# Patient Record
Sex: Female | Born: 1945
Health system: Southern US, Community
[De-identification: ages and names within clinical notes are randomized; demographics above are authoritative.]

## PROBLEM LIST (undated history)

## (undated) DIAGNOSIS — E039 Hypothyroidism, unspecified: Secondary | ICD-10-CM

## (undated) DIAGNOSIS — K5790 Diverticulosis of intestine, part unspecified, without perforation or abscess without bleeding: Secondary | ICD-10-CM

## (undated) DIAGNOSIS — K219 Gastro-esophageal reflux disease without esophagitis: Secondary | ICD-10-CM

## (undated) DIAGNOSIS — L84 Corns and callosities: Secondary | ICD-10-CM

## (undated) DIAGNOSIS — M1990 Unspecified osteoarthritis, unspecified site: Secondary | ICD-10-CM

## (undated) DIAGNOSIS — F419 Anxiety disorder, unspecified: Secondary | ICD-10-CM

## (undated) DIAGNOSIS — N3941 Urge incontinence: Secondary | ICD-10-CM

## (undated) DIAGNOSIS — Z131 Encounter for screening for diabetes mellitus: Secondary | ICD-10-CM

## (undated) DIAGNOSIS — H409 Unspecified glaucoma: Secondary | ICD-10-CM

## (undated) DIAGNOSIS — E7800 Pure hypercholesterolemia, unspecified: Secondary | ICD-10-CM

## (undated) DIAGNOSIS — J309 Allergic rhinitis, unspecified: Secondary | ICD-10-CM

## (undated) DIAGNOSIS — K635 Polyp of colon: Secondary | ICD-10-CM

## (undated) DIAGNOSIS — E669 Obesity, unspecified: Secondary | ICD-10-CM

## (undated) HISTORY — DX: Hypothyroidism, unspecified: E03.9

## (undated) HISTORY — PX: COLONOSCOPY: SHX174

## (undated) HISTORY — DX: Polyp of colon: K63.5

## (undated) HISTORY — DX: Anxiety disorder, unspecified: F41.9

## (undated) HISTORY — DX: Allergic rhinitis, unspecified: J30.9

## (undated) HISTORY — DX: Pure hypercholesterolemia, unspecified: E78.00

## (undated) HISTORY — DX: Unspecified osteoarthritis, unspecified site: M19.90

## (undated) HISTORY — DX: Gastro-esophageal reflux disease without esophagitis: K21.9

## (undated) HISTORY — DX: Obesity, unspecified: E66.9

## (undated) HISTORY — DX: Urge incontinence: N39.41

## (undated) HISTORY — DX: Encounter for screening for diabetes mellitus: Z13.1

## (undated) HISTORY — PX: ROTATOR CUFF REPAIR: SHX139

## (undated) HISTORY — DX: Corns and callosities: L84

## (undated) HISTORY — PX: BUNIONECTOMY: SHX129

## (undated) HISTORY — DX: Diverticulosis of intestine, part unspecified, without perforation or abscess without bleeding: K57.90

## (undated) HISTORY — PX: CATARACT EXTRACTION: SUR2

## (undated) HISTORY — DX: Unspecified glaucoma: H40.9

---
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
## 1983-11-01 HISTORY — PX: ABDOMINAL HYSTERECTOMY: SHX81

## 1985-10-31 HISTORY — PX: THYROIDECTOMY: SHX17

## 2008-10-31 HISTORY — PX: CARPAL TUNNEL RELEASE: SHX101

## 2010-10-26 DIAGNOSIS — E663 Overweight: Secondary | ICD-10-CM | POA: Insufficient documentation

## 2010-10-26 DIAGNOSIS — M1990 Unspecified osteoarthritis, unspecified site: Secondary | ICD-10-CM | POA: Insufficient documentation

## 2010-10-26 DIAGNOSIS — Z1239 Encounter for other screening for malignant neoplasm of breast: Secondary | ICD-10-CM | POA: Insufficient documentation

## 2010-10-26 DIAGNOSIS — Z9889 Other specified postprocedural states: Secondary | ICD-10-CM | POA: Insufficient documentation

## 2010-10-26 DIAGNOSIS — E039 Hypothyroidism, unspecified: Secondary | ICD-10-CM | POA: Insufficient documentation

## 2010-10-26 DIAGNOSIS — M2010 Hallux valgus (acquired), unspecified foot: Secondary | ICD-10-CM

## 2010-10-26 DIAGNOSIS — M542 Cervicalgia: Secondary | ICD-10-CM | POA: Insufficient documentation

## 2010-10-26 HISTORY — DX: Encounter for other screening for malignant neoplasm of breast: Z12.39

## 2010-10-26 HISTORY — DX: Unspecified osteoarthritis, unspecified site: M19.90

## 2010-10-26 HISTORY — DX: Hallux valgus (acquired), unspecified foot: M20.10

## 2010-10-26 HISTORY — DX: Other specified postprocedural states: Z98.890

## 2010-10-26 HISTORY — DX: Cervicalgia: M54.2

## 2010-10-26 HISTORY — DX: Overweight: E66.3

## 2011-06-03 DIAGNOSIS — E7800 Pure hypercholesterolemia, unspecified: Secondary | ICD-10-CM

## 2011-06-03 DIAGNOSIS — R300 Dysuria: Secondary | ICD-10-CM

## 2011-06-03 HISTORY — DX: Dysuria: R30.0

## 2011-06-03 HISTORY — DX: Pure hypercholesterolemia, unspecified: E78.00

## 2011-12-09 DIAGNOSIS — E039 Hypothyroidism, unspecified: Secondary | ICD-10-CM | POA: Diagnosis not present

## 2011-12-09 DIAGNOSIS — Z0000 Encounter for general adult medical examination without abnormal findings: Secondary | ICD-10-CM | POA: Diagnosis not present

## 2011-12-09 DIAGNOSIS — E785 Hyperlipidemia, unspecified: Secondary | ICD-10-CM | POA: Diagnosis not present

## 2011-12-16 DIAGNOSIS — M25511 Pain in right shoulder: Secondary | ICD-10-CM | POA: Insufficient documentation

## 2011-12-16 DIAGNOSIS — E785 Hyperlipidemia, unspecified: Secondary | ICD-10-CM | POA: Diagnosis not present

## 2011-12-16 DIAGNOSIS — Z1211 Encounter for screening for malignant neoplasm of colon: Secondary | ICD-10-CM

## 2011-12-16 DIAGNOSIS — M25519 Pain in unspecified shoulder: Secondary | ICD-10-CM | POA: Diagnosis not present

## 2011-12-16 DIAGNOSIS — E039 Hypothyroidism, unspecified: Secondary | ICD-10-CM | POA: Diagnosis not present

## 2011-12-16 HISTORY — DX: Encounter for screening for malignant neoplasm of colon: Z12.11

## 2011-12-16 HISTORY — DX: Pain in right shoulder: M25.511

## 2011-12-22 DIAGNOSIS — M25519 Pain in unspecified shoulder: Secondary | ICD-10-CM | POA: Diagnosis not present

## 2012-01-20 DIAGNOSIS — N3090 Cystitis, unspecified without hematuria: Secondary | ICD-10-CM | POA: Diagnosis not present

## 2012-01-20 DIAGNOSIS — R300 Dysuria: Secondary | ICD-10-CM | POA: Diagnosis not present

## 2012-01-20 DIAGNOSIS — N952 Postmenopausal atrophic vaginitis: Secondary | ICD-10-CM | POA: Diagnosis not present

## 2012-02-17 DIAGNOSIS — K6389 Other specified diseases of intestine: Secondary | ICD-10-CM | POA: Diagnosis not present

## 2012-02-17 DIAGNOSIS — D126 Benign neoplasm of colon, unspecified: Secondary | ICD-10-CM | POA: Diagnosis not present

## 2012-02-17 DIAGNOSIS — Z1211 Encounter for screening for malignant neoplasm of colon: Secondary | ICD-10-CM | POA: Diagnosis not present

## 2012-03-28 DIAGNOSIS — M25519 Pain in unspecified shoulder: Secondary | ICD-10-CM | POA: Diagnosis not present

## 2012-04-13 DIAGNOSIS — M25819 Other specified joint disorders, unspecified shoulder: Secondary | ICD-10-CM | POA: Diagnosis not present

## 2012-05-04 DIAGNOSIS — M67919 Unspecified disorder of synovium and tendon, unspecified shoulder: Secondary | ICD-10-CM | POA: Diagnosis not present

## 2012-05-07 DIAGNOSIS — M719 Bursopathy, unspecified: Secondary | ICD-10-CM | POA: Diagnosis not present

## 2012-05-07 DIAGNOSIS — M67919 Unspecified disorder of synovium and tendon, unspecified shoulder: Secondary | ICD-10-CM | POA: Diagnosis not present

## 2012-05-16 DIAGNOSIS — M67919 Unspecified disorder of synovium and tendon, unspecified shoulder: Secondary | ICD-10-CM | POA: Diagnosis not present

## 2012-05-16 DIAGNOSIS — M719 Bursopathy, unspecified: Secondary | ICD-10-CM | POA: Diagnosis not present

## 2012-06-12 DIAGNOSIS — M19019 Primary osteoarthritis, unspecified shoulder: Secondary | ICD-10-CM | POA: Diagnosis not present

## 2012-06-12 DIAGNOSIS — M75110 Incomplete rotator cuff tear or rupture of unspecified shoulder, not specified as traumatic: Secondary | ICD-10-CM | POA: Diagnosis not present

## 2012-06-12 DIAGNOSIS — M67919 Unspecified disorder of synovium and tendon, unspecified shoulder: Secondary | ICD-10-CM | POA: Diagnosis not present

## 2012-06-12 DIAGNOSIS — M75100 Unspecified rotator cuff tear or rupture of unspecified shoulder, not specified as traumatic: Secondary | ICD-10-CM | POA: Diagnosis not present

## 2012-06-12 DIAGNOSIS — IMO0002 Reserved for concepts with insufficient information to code with codable children: Secondary | ICD-10-CM | POA: Diagnosis not present

## 2012-06-12 DIAGNOSIS — M719 Bursopathy, unspecified: Secondary | ICD-10-CM | POA: Diagnosis not present

## 2012-06-18 DIAGNOSIS — M75110 Incomplete rotator cuff tear or rupture of unspecified shoulder, not specified as traumatic: Secondary | ICD-10-CM | POA: Diagnosis not present

## 2012-06-25 DIAGNOSIS — Z9889 Other specified postprocedural states: Secondary | ICD-10-CM | POA: Diagnosis not present

## 2012-06-28 DIAGNOSIS — Z9889 Other specified postprocedural states: Secondary | ICD-10-CM | POA: Diagnosis not present

## 2012-07-03 DIAGNOSIS — Z9889 Other specified postprocedural states: Secondary | ICD-10-CM | POA: Diagnosis not present

## 2012-07-05 DIAGNOSIS — M75110 Incomplete rotator cuff tear or rupture of unspecified shoulder, not specified as traumatic: Secondary | ICD-10-CM | POA: Diagnosis not present

## 2012-07-10 DIAGNOSIS — M75110 Incomplete rotator cuff tear or rupture of unspecified shoulder, not specified as traumatic: Secondary | ICD-10-CM | POA: Diagnosis not present

## 2012-07-12 DIAGNOSIS — M75110 Incomplete rotator cuff tear or rupture of unspecified shoulder, not specified as traumatic: Secondary | ICD-10-CM | POA: Diagnosis not present

## 2012-07-16 DIAGNOSIS — M75110 Incomplete rotator cuff tear or rupture of unspecified shoulder, not specified as traumatic: Secondary | ICD-10-CM | POA: Diagnosis not present

## 2012-07-19 DIAGNOSIS — M75110 Incomplete rotator cuff tear or rupture of unspecified shoulder, not specified as traumatic: Secondary | ICD-10-CM | POA: Diagnosis not present

## 2012-07-23 DIAGNOSIS — M75110 Incomplete rotator cuff tear or rupture of unspecified shoulder, not specified as traumatic: Secondary | ICD-10-CM | POA: Diagnosis not present

## 2012-07-26 DIAGNOSIS — S43429A Sprain of unspecified rotator cuff capsule, initial encounter: Secondary | ICD-10-CM | POA: Diagnosis not present

## 2012-07-31 DIAGNOSIS — S43429A Sprain of unspecified rotator cuff capsule, initial encounter: Secondary | ICD-10-CM | POA: Diagnosis not present

## 2012-08-02 DIAGNOSIS — S43429A Sprain of unspecified rotator cuff capsule, initial encounter: Secondary | ICD-10-CM | POA: Diagnosis not present

## 2012-08-04 DIAGNOSIS — Z23 Encounter for immunization: Secondary | ICD-10-CM | POA: Diagnosis not present

## 2012-08-07 DIAGNOSIS — S43429A Sprain of unspecified rotator cuff capsule, initial encounter: Secondary | ICD-10-CM | POA: Diagnosis not present

## 2012-08-09 DIAGNOSIS — S43429A Sprain of unspecified rotator cuff capsule, initial encounter: Secondary | ICD-10-CM | POA: Diagnosis not present

## 2012-08-14 DIAGNOSIS — S43429A Sprain of unspecified rotator cuff capsule, initial encounter: Secondary | ICD-10-CM | POA: Diagnosis not present

## 2012-08-17 DIAGNOSIS — S43429A Sprain of unspecified rotator cuff capsule, initial encounter: Secondary | ICD-10-CM | POA: Diagnosis not present

## 2012-08-20 DIAGNOSIS — S43429A Sprain of unspecified rotator cuff capsule, initial encounter: Secondary | ICD-10-CM | POA: Diagnosis not present

## 2012-08-23 DIAGNOSIS — S43429A Sprain of unspecified rotator cuff capsule, initial encounter: Secondary | ICD-10-CM | POA: Diagnosis not present

## 2012-08-27 DIAGNOSIS — S43429A Sprain of unspecified rotator cuff capsule, initial encounter: Secondary | ICD-10-CM | POA: Diagnosis not present

## 2012-08-29 DIAGNOSIS — S43429A Sprain of unspecified rotator cuff capsule, initial encounter: Secondary | ICD-10-CM | POA: Diagnosis not present

## 2012-09-04 DIAGNOSIS — S43429A Sprain of unspecified rotator cuff capsule, initial encounter: Secondary | ICD-10-CM | POA: Diagnosis not present

## 2012-09-06 DIAGNOSIS — S43429A Sprain of unspecified rotator cuff capsule, initial encounter: Secondary | ICD-10-CM | POA: Diagnosis not present

## 2012-09-10 DIAGNOSIS — S43429A Sprain of unspecified rotator cuff capsule, initial encounter: Secondary | ICD-10-CM | POA: Diagnosis not present

## 2012-09-12 DIAGNOSIS — S43429A Sprain of unspecified rotator cuff capsule, initial encounter: Secondary | ICD-10-CM | POA: Diagnosis not present

## 2012-09-15 ENCOUNTER — Encounter (HOSPITAL_COMMUNITY): Payer: Self-pay | Admitting: *Deleted

## 2012-09-15 ENCOUNTER — Emergency Department (HOSPITAL_COMMUNITY)
Admission: EM | Admit: 2012-09-15 | Discharge: 2012-09-15 | Disposition: A | Discharge status: Home / Self Care | Payer: Medicare Other | Attending: Emergency Medicine | Admitting: Emergency Medicine

## 2012-09-15 DIAGNOSIS — Z87891 Personal history of nicotine dependence: Secondary | ICD-10-CM | POA: Diagnosis not present

## 2012-09-15 DIAGNOSIS — Z79899 Other long term (current) drug therapy: Secondary | ICD-10-CM | POA: Diagnosis not present

## 2012-09-15 DIAGNOSIS — M542 Cervicalgia: Secondary | ICD-10-CM | POA: Diagnosis not present

## 2012-09-15 DIAGNOSIS — M25519 Pain in unspecified shoulder: Secondary | ICD-10-CM | POA: Diagnosis not present

## 2012-09-15 DIAGNOSIS — Z9889 Other specified postprocedural states: Secondary | ICD-10-CM | POA: Insufficient documentation

## 2012-09-15 DIAGNOSIS — E039 Hypothyroidism, unspecified: Secondary | ICD-10-CM | POA: Diagnosis not present

## 2012-09-15 MED ORDER — PREDNISONE 20 MG PO TABS: 40.0000 mg | ORAL_TABLET | Freq: Every day | ORAL | Status: DC

## 2012-09-15 MED ORDER — PREDNISONE 20 MG PO TABS
40.0000 mg | ORAL_TABLET | Freq: Once | ORAL | Status: AC
  Administered 2012-09-15: 40 mg via ORAL
  Filled 2012-09-15: qty 2

## 2012-09-15 NOTE — ED Notes (Signed)
Pt c/o right sided neck tenderness and swelling. Pt reports pain in neck when she looks to the right. Pt in nad, airway intact, speaking in full sentences.

## 2012-09-15 NOTE — ED Provider Notes (Signed)
History  This chart was scribed for Raeford Razor, MD by Ladona Ridgel Day, ED scribe. This patient was seen in room TR11C/TR11C and the patient's care was started at 1037.   CSN: [id number]  Arrival date & time 09/15/12  1037   First MD Initiated Contact with Patient 09/15/12 1058      Chief Complaint  Patient presents with  . Lymphadenopathy   Patient is a 66 y.o. female presenting with shoulder pain. The history is provided by the patient.  Shoulder Pain This is a new problem. The current episode started more than 2 days ago. The problem occurs constantly. The problem has been gradually worsening. Pertinent negatives include no chest pain and no shortness of breath. The symptoms are aggravated by twisting. Nothing relieves the symptoms. She has tried nothing (rotator surgery 3 months ago) for the symptoms.  Colleen [name] is a 65 y.o. female who presents to the Emergency Department complaining of constant gradually worsening neck pain for the past 3 days which worsened yesterday. She states associated right shoulder soreness from rotator cuff surgery 3 months ago; she states shoulder pain is worse with movement and exacerbates her neck pain. She states moving her head side to side also makes her pain worse. She denies any trouble SOB or wheezing. She states a previous thyroid surgery many years ago. She has been taking Vicodin for her shoulder but states only mild relief from pain.   She is a former smoker and does not drink alcohol.   Past Medical History  Diagnosis Date  . Hypothyroid     History reviewed. No pertinent past surgical history.  No family history on file.  History  Substance Use Topics  . Smoking status: Never Smoker   . Smokeless tobacco: Not on file  . Alcohol Use: No    OB History    Grav Para Term Preterm Abortions TAB SAB Ect Mult Living                  Review of Systems  Constitutional: Negative for fever and chills.  HENT: Positive for neck pain (neck  soreness, worse with movement). Negative for congestion and rhinorrhea.   Respiratory: Negative for shortness of breath.   Cardiovascular: Negative for chest pain.  Gastrointestinal: Negative for nausea and vomiting.  Neurological: Negative for weakness.  All other systems reviewed and are negative.    Allergies  Review of patient's allergies indicates no known allergies.  Home Medications   Current Outpatient Rx  Name  Route  Sig  Dispense  Refill  . VITAMIN C PO   Oral   Take 1 tablet by mouth daily.         . ASPIRIN 81 MG PO TABS   Oral   Take 81 mg by mouth daily.         Marland Kitchen CALCIUM-VITAMIN D PO   Oral   Take 1 tablet by mouth daily.         Marland Kitchen DICLOFENAC SODIUM 1 % TD GEL   Topical   Apply 2 g topically 2 (two) times daily as needed. For pain         . HYDROCODONE-ACETAMINOPHEN 5-325 MG PO TABS   Oral   Take 1-2 tablets by mouth every 4 (four) hours as needed. For pain         . LEVOTHYROXINE SODIUM 75 MCG PO TABS   Oral   Take 75 mcg by mouth daily.         Marland Kitchen  METHOCARBAMOL 500 MG PO TABS   Oral   Take 500 mg by mouth every 6 (six) hours as needed. Muscle spasms         . ADULT MULTIVITAMIN W/MINERALS CH   Oral   Take 1 tablet by mouth daily.           Triage Vitals: BP 138/66  Pulse 72  Temp 98 F (36.7 C) (Oral)  Resp 20  SpO2 98%  Physical Exam  Nursing note and vitals reviewed. Constitutional: She appears well-developed and well-nourished. No distress.  HENT:  Head: Normocephalic and atraumatic.  Eyes: Conjunctivae normal are normal. Right eye exhibits no discharge. Left eye exhibits no discharge.  Neck: Neck supple. No tracheal deviation present. No thyromegaly present.       Anterior surgical scar to her neck. No adenopathy. No tenderness along sternocleidomastoid. Neck is supple, mild tenderness along right side or trachea. No overlying skin changes. Phonation is clear.   Cardiovascular: Normal rate, regular rhythm and normal  heart sounds.  Exam reveals no gallop and no friction rub.   No murmur heard. Pulmonary/Chest: Effort normal and breath sounds normal. No stridor. No respiratory distress.  Abdominal: Soft. She exhibits no distension. There is no tenderness.  Musculoskeletal: She exhibits no edema and no tenderness.  Lymphadenopathy:    She has no cervical adenopathy.  Neurological: She is alert.  Skin: Skin is warm and dry.  Psychiatric: She has a normal mood and affect. Her behavior is normal. Thought content normal.    ED Course  Procedures (including critical care time) DIAGNOSTIC STUDIES: Oxygen Saturation is 98% on room air, normal by my interpretation.    COORDINATION OF CARE: At 1105 AM Discussed treatment plan with patient which includes symptomatic treatment for her neck pain and return to ED if her symptoms change or worsen. Patient agrees.   Labs Reviewed - No data to display No results found.   1. Neck pain       MDM  66 year old female with right-sided neck pain. This really does not seem to be muscular to suggest torticollis or strain. She is actually tender with palpation to the right side of her trachea near her larynx. No stridor. Normal phonation. No vascular bruit appreciated. Neck is supple. No evidence of deep space neck infection. Etiology is not completely clear. This may be a viral tracheitis. Less likely laryngitis without changes in her voice. Brainstem and treatment at this time. Return precautions discussed. Outpatient followup as needed otherwise.  I personally preformed the services scribed in my presence. The recorded information has been reviewed and is accurate. Raeford Razor, MD.         Raeford Razor, MD 09/17/12 [phone]

## 2012-09-15 NOTE — ED Notes (Signed)
Patient with neck pain for about three days.  States that it started hurting about three days ago and her right neck feels like it is swollen and it hurts to swallow

## 2012-09-19 DIAGNOSIS — S43429A Sprain of unspecified rotator cuff capsule, initial encounter: Secondary | ICD-10-CM | POA: Diagnosis not present

## 2012-09-24 DIAGNOSIS — S43429A Sprain of unspecified rotator cuff capsule, initial encounter: Secondary | ICD-10-CM | POA: Diagnosis not present

## 2012-10-04 DIAGNOSIS — S43429A Sprain of unspecified rotator cuff capsule, initial encounter: Secondary | ICD-10-CM | POA: Diagnosis not present

## 2012-10-09 DIAGNOSIS — S43429A Sprain of unspecified rotator cuff capsule, initial encounter: Secondary | ICD-10-CM | POA: Diagnosis not present

## 2012-10-15 DIAGNOSIS — S43429A Sprain of unspecified rotator cuff capsule, initial encounter: Secondary | ICD-10-CM | POA: Diagnosis not present

## 2012-10-18 DIAGNOSIS — S43429A Sprain of unspecified rotator cuff capsule, initial encounter: Secondary | ICD-10-CM | POA: Diagnosis not present

## 2012-10-22 DIAGNOSIS — S43429A Sprain of unspecified rotator cuff capsule, initial encounter: Secondary | ICD-10-CM | POA: Diagnosis not present

## 2012-11-05 DIAGNOSIS — S43429A Sprain of unspecified rotator cuff capsule, initial encounter: Secondary | ICD-10-CM | POA: Diagnosis not present

## 2012-11-13 ENCOUNTER — Other Ambulatory Visit: Payer: Self-pay | Admitting: Internal Medicine

## 2012-11-13 DIAGNOSIS — Z131 Encounter for screening for diabetes mellitus: Secondary | ICD-10-CM | POA: Diagnosis not present

## 2012-11-13 DIAGNOSIS — Z1231 Encounter for screening mammogram for malignant neoplasm of breast: Secondary | ICD-10-CM

## 2012-11-13 DIAGNOSIS — R300 Dysuria: Secondary | ICD-10-CM | POA: Diagnosis not present

## 2012-11-13 DIAGNOSIS — E039 Hypothyroidism, unspecified: Secondary | ICD-10-CM | POA: Diagnosis not present

## 2012-11-13 DIAGNOSIS — L84 Corns and callosities: Secondary | ICD-10-CM | POA: Diagnosis not present

## 2012-12-11 ENCOUNTER — Ambulatory Visit
Admission: RE | Admit: 2012-12-11 | Discharge: 2012-12-11 | Disposition: A | Discharge status: Home / Self Care | Payer: Managed Care, Other (non HMO) | Source: Ambulatory Visit | Attending: Internal Medicine | Admitting: Internal Medicine

## 2012-12-11 DIAGNOSIS — Z1231 Encounter for screening mammogram for malignant neoplasm of breast: Secondary | ICD-10-CM | POA: Diagnosis not present

## 2012-12-11 LAB — HM MAMMOGRAPHY: HM Mammogram: NEGATIVE

## 2013-02-07 ENCOUNTER — Other Ambulatory Visit: Payer: Self-pay | Admitting: *Deleted

## 2013-02-07 DIAGNOSIS — L84 Corns and callosities: Secondary | ICD-10-CM

## 2013-02-07 DIAGNOSIS — Z131 Encounter for screening for diabetes mellitus: Secondary | ICD-10-CM

## 2013-02-07 DIAGNOSIS — R300 Dysuria: Secondary | ICD-10-CM

## 2013-02-07 DIAGNOSIS — E039 Hypothyroidism, unspecified: Secondary | ICD-10-CM

## 2013-02-13 ENCOUNTER — Other Ambulatory Visit: Payer: Medicare Other

## 2013-02-13 DIAGNOSIS — R300 Dysuria: Secondary | ICD-10-CM | POA: Diagnosis not present

## 2013-02-13 DIAGNOSIS — Z131 Encounter for screening for diabetes mellitus: Secondary | ICD-10-CM | POA: Diagnosis not present

## 2013-02-13 DIAGNOSIS — E039 Hypothyroidism, unspecified: Secondary | ICD-10-CM | POA: Diagnosis not present

## 2013-02-13 DIAGNOSIS — L84 Corns and callosities: Secondary | ICD-10-CM | POA: Diagnosis not present

## 2013-02-14 ENCOUNTER — Other Ambulatory Visit: Payer: Self-pay

## 2013-02-14 LAB — COMPREHENSIVE METABOLIC PANEL
ALT: 19 IU/L (ref 0–32)
AST: 30 IU/L (ref 0–40)
Albumin/Globulin Ratio: 1.9 (ref 1.1–2.5)
Albumin: 4.1 g/dL (ref 3.6–4.8)
Alkaline Phosphatase: 68 IU/L (ref 39–117)
BUN/Creatinine Ratio: 12 (ref 11–26)
BUN: 11 mg/dL (ref 8–27)
CO2: 27 mmol/L (ref 19–28)
Calcium: 10.2 mg/dL (ref 8.6–10.2)
Chloride: 107 mmol/L (ref 97–108)
Creatinine, Ser: 0.89 mg/dL (ref 0.57–1.00)
GFR calc Af Amer: 77 mL/min/{1.73_m2} (ref >59)
GFR calc non Af Amer: 67 mL/min/{1.73_m2} (ref >59)
Globulin, Total: 2.2 g/dL (ref 1.5–4.5)
Glucose: 91 mg/dL (ref 65–99)
Potassium: 5.1 mmol/L (ref 3.5–5.2)
Sodium: 144 mmol/L (ref 134–144)
Total Bilirubin: 0.3 mg/dL (ref 0.0–1.2)
Total Protein: 6.3 g/dL (ref 6.0–8.5)

## 2013-02-14 LAB — CBC WITH DIFFERENTIAL/PLATELET
Basophils Absolute: 0 10*3/uL (ref 0.0–0.2)
Basos: 1 % (ref 0–3)
Eos: 4 % (ref 0–5)
Eosinophils Absolute: 0.1 10*3/uL (ref 0.0–0.4)
HCT: 36 % (ref 34.0–46.6)
Hemoglobin: 11.3 g/dL (ref 11.1–15.9)
Immature Grans (Abs): 0 10*3/uL (ref 0.0–0.1)
Immature Granulocytes: 0 % (ref 0–2)
Lymphocytes Absolute: 1.7 10*3/uL (ref 0.7–3.1)
Lymphs: 44 % (ref 14–46)
MCH: 30 pg (ref 26.6–33.0)
MCHC: 31.4 g/dL — ABNORMAL LOW (ref 31.5–35.7)
MCV: 96 fL (ref 79–97)
Monocytes Absolute: 0.3 10*3/uL (ref 0.1–0.9)
Monocytes: 7 % (ref 4–12)
Neutrophils Absolute: 1.6 10*3/uL (ref 1.4–7.0)
Neutrophils Relative %: 44 % (ref 40–74)
RBC: 3.77 x10E6/uL (ref 3.77–5.28)
RDW: 14 % (ref 12.3–15.4)
WBC: 3.7 10*3/uL (ref 3.4–10.8)

## 2013-02-14 LAB — THYROID PANEL WITH TSH
Free Thyroxine Index: 2.2 (ref 1.2–4.9)
T3 Uptake Ratio: 25 % (ref 24–39)
T4, Total: 8.9 ug/dL (ref 4.5–12.0)
TSH: 0.516 u[IU]/mL (ref 0.450–4.500)

## 2013-02-14 LAB — DRAW FEE (FINGERSTICK)

## 2013-02-14 LAB — HEMOGLOBIN A1C
Est. average glucose Bld gHb Est-mCnc: 108 mg/dL
Hgb A1c MFr Bld: 5.4 % (ref 4.8–5.6)

## 2013-02-14 LAB — LIPID PANEL
Chol/HDL Ratio: 2.4 ratio units (ref 0.0–4.4)
Cholesterol, Total: 181 mg/dL (ref 100–199)
HDL: 74 mg/dL (ref >39)
LDL Calculated: 97 mg/dL (ref 0–99)
Triglycerides: 51 mg/dL (ref 0–149)
VLDL Cholesterol Cal: 10 mg/dL (ref 5–40)

## 2013-02-15 ENCOUNTER — Other Ambulatory Visit: Payer: Self-pay

## 2013-02-18 ENCOUNTER — Encounter: Payer: Self-pay | Admitting: *Deleted

## 2013-02-19 ENCOUNTER — Ambulatory Visit (INDEPENDENT_AMBULATORY_CARE_PROVIDER_SITE_OTHER): Payer: Medicare Other | Admitting: Internal Medicine

## 2013-02-19 ENCOUNTER — Encounter: Payer: Self-pay | Admitting: Internal Medicine

## 2013-02-19 VITALS — BP 138/78 | HR 70 | Temp 97.9°F | Resp 16 | Ht 61.5 in | Wt 174.0 lb

## 2013-02-19 DIAGNOSIS — M949 Disorder of cartilage, unspecified: Secondary | ICD-10-CM | POA: Diagnosis not present

## 2013-02-19 DIAGNOSIS — Z90710 Acquired absence of both cervix and uterus: Secondary | ICD-10-CM

## 2013-02-19 DIAGNOSIS — E039 Hypothyroidism, unspecified: Secondary | ICD-10-CM | POA: Diagnosis not present

## 2013-02-19 DIAGNOSIS — Z23 Encounter for immunization: Secondary | ICD-10-CM | POA: Diagnosis not present

## 2013-02-19 DIAGNOSIS — L84 Corns and callosities: Secondary | ICD-10-CM

## 2013-02-19 DIAGNOSIS — Z780 Asymptomatic menopausal state: Secondary | ICD-10-CM | POA: Insufficient documentation

## 2013-02-19 DIAGNOSIS — M899 Disorder of bone, unspecified: Secondary | ICD-10-CM | POA: Insufficient documentation

## 2013-02-19 DIAGNOSIS — Z0000 Encounter for general adult medical examination without abnormal findings: Secondary | ICD-10-CM | POA: Insufficient documentation

## 2013-02-19 HISTORY — DX: Acquired absence of both cervix and uterus: Z90.710

## 2013-02-19 HISTORY — DX: Asymptomatic menopausal state: Z78.0

## 2013-02-19 HISTORY — DX: Encounter for general adult medical examination without abnormal findings: Z00.00

## 2013-02-19 HISTORY — DX: Corns and callosities: L84

## 2013-02-19 MED ORDER — LEVOTHYROXINE SODIUM 50 MCG PO TABS: 50.0000 ug | ORAL_TABLET | Freq: Every day | ORAL | Status: DC

## 2013-02-19 NOTE — Progress Notes (Signed)
Subjective:    Patient ID: Colleen [name], female    DOB: [birth year], 67 y.o.   MRN: [id number]  No Known Allergies  Chief complaint- hypothyroidism, annual visit  HPI 67 y/o female patient is here for her physical. She has been taking care of her less than a year old grandkid and has been busy with this. Is careful with her meals. Her callus in the heels have been bothering her mainly with appearance. Denies any pain or drainage. ocassioanl problem with daily bowel movement No other complaints. Reviewed mammogram 2/14 normal Colonoscopy 2013 was normal except a benign polyp and repeat recommended in 5 years Has not had pneumococcal vaccine Has not had dexa scan in past uptodate with shingles and flu vaccine Reviewed labs  Review of Systems  Constitutional: Negative for fever, chills, diaphoresis, activity change, appetite change and unexpected weight change.  HENT: Negative for hearing loss, ear pain and congestion.   Eyes: Negative for discharge and visual disturbance.       Has not seen eye doctor recently. Wears corrective lenses.  Respiratory: Negative for cough, shortness of breath and wheezing.   Cardiovascular: Negative for chest pain, palpitations and leg swelling.  Gastrointestinal: Positive for constipation. Negative for nausea, vomiting, abdominal pain and blood in stool.       Taking miralax 3-4 times a week and this is helpful. Colonoscopy in 2013   Genitourinary: Negative for dysuria, frequency, flank pain, vaginal bleeding, vaginal discharge and difficulty urinating.       Pap smear several years back, s/p complete hysterectomy  Musculoskeletal: Positive for arthralgias. Negative for back pain.       Stable now but acts up in winter and rainy weather  Skin: Negative for pallor, rash and wound.  Neurological: Negative for dizziness, syncope, weakness and light-headedness.  Hematological: Negative for adenopathy. Does not bruise/bleed easily.  Psychiatric/Behavioral:  Negative for confusion, self-injury and agitation. The patient is not nervous/anxious.    Past Medical History  Diagnosis Date  . Hypothyroid   . Corns and callosities   . Urge incontinence   . Screening for diabetes mellitus    Past Surgical History  Procedure Laterality Date  . Abdominal hysterectomy  1985    Dr Su Hilt  . Tonsillectomy  1951  . Thyroidectomy  1987    Dr Raechel Ache  . Carpel tunnel Bilateral 2010    Dr Christell Constant  . Right bunion      Dr Ernestine Conrad  . Right rotator cuff repair      Dr Thomasena Edis   History   Social History  . Marital Status: Widowed    Spouse Name: N/A    Number of Children: N/A  . Years of Education: N/A   Occupational History  . Not on file.   Social History Main Topics  . Smoking status: Never Smoker   . Smokeless tobacco: Not on file  . Alcohol Use: No  . Drug Use: Not on file  . Sexually Active: Not on file   Other Topics Concern  . Not on file   Social History Narrative  . No narrative on file      Objective:   Physical Exam  Vitals reviewed. Constitutional: She is oriented to person, place, and time. She appears well-developed and well-nourished. No distress.  HENT:  Head: Normocephalic and atraumatic.  Right Ear: External ear normal.  Left Ear: External ear normal.  Nose: Nose normal.  Mouth/Throat: Oropharynx is clear and moist. No oropharyngeal exudate.  Eyes: Conjunctivae and EOM  are normal. Pupils are equal, round, and reactive to light. Right eye exhibits no discharge. Left eye exhibits no discharge. No scleral icterus.  Neck: Normal range of motion. Neck supple. No JVD present. No tracheal deviation present.  Cardiovascular: Normal rate, regular rhythm, normal heart sounds and intact distal pulses.   No murmur heard. Pulmonary/Chest: Effort normal and breath sounds normal. No stridor. No respiratory distress. She has no wheezes. She has no rales. She exhibits no tenderness.  Abdominal: Soft. Bowel sounds are normal. She  exhibits no distension and no mass. There is no tenderness. There is no rebound and no guarding.  Genitourinary:  refused  Musculoskeletal: Normal range of motion. She exhibits no edema and no tenderness.  Lymphadenopathy:    She has no cervical adenopathy.  Neurological: She is alert and oriented to person, place, and time. No cranial nerve deficit. Coordination normal.  Skin: Skin is warm and dry. No rash noted. She is not diaphoretic. No erythema. No pallor.  Has callus in both heels with black color of the callus  Psychiatric: She has a normal mood and affect. Her behavior is normal. Judgment and thought content normal.    BP 138/78  Pulse 70  Temp(Src) 97.9 F (36.6 C) (Oral)  Resp 16  Ht 5' 1.5" (1.562 m)  Wt 174 lb (78.926 kg)  BMI 32.35 kg/m2  SpO2 96%  Labs- CBC    Component Value Date/Time   WBC 3.7 02/13/2013 1048   RBC 3.77 02/13/2013 1048   HGB 11.3 02/13/2013 1048   HCT 36.0 02/13/2013 1048   MCV 96 02/13/2013 1048   MCH 30.0 02/13/2013 1048   MCHC 31.4* 02/13/2013 1048   RDW 14.0 02/13/2013 1048   LYMPHSABS 1.7 02/13/2013 1048   EOSABS 0.1 02/13/2013 1048   BASOSABS 0.0 02/13/2013 1048    CMP     Component Value Date/Time   NA 144 02/13/2013 1048   K 5.1 02/13/2013 1048   CL 107 02/13/2013 1048   CO2 27 02/13/2013 1048   GLUCOSE 91 02/13/2013 1048   BUN 11 02/13/2013 1048   CREATININE 0.89 02/13/2013 1048   CALCIUM 10.2 02/13/2013 1048   PROT 6.3 02/13/2013 1048   AST 30 02/13/2013 1048   ALT 19 02/13/2013 1048   ALKPHOS 68 02/13/2013 1048   BILITOT 0.3 02/13/2013 1048   GFRNONAA 67 02/13/2013 1048   GFRAA 77 02/13/2013 1048   Lipid Panel     Component Value Date/Time   TRIG 51 02/13/2013 1048   HDL 74 02/13/2013 1048   CHOLHDL 2.4 02/13/2013 1048   LDLCALC 97 02/13/2013 1048   tsh 0.516, t4 8.9, t3 25 a1c 5.4    Assessment & Plan:   Calluses of heels- will provide podiatry referral for callus removal. Patient has tried OTC callus tape and drops without any  help.  Hypothyroidism- imporved tsh level. Will decrease levothyroxine to 50 mcg daily for now. Recheck tsh in 3 months  Constipation- under control with prn miralax, monitor- encouraged fiber intake  postmenoausal symptom- s/p total hysterectomy and menopause. Will get dexa scan to assess for osteopenia vs osteoporosis  General medical exam- upotdate with mammogram. Will provide pneumococcal vaccine. Up to date with colonoscopy. exercise and dietary counselling with sun exposure prophylaxis provided

## 2013-03-04 DIAGNOSIS — L84 Corns and callosities: Secondary | ICD-10-CM | POA: Diagnosis not present

## 2013-05-15 ENCOUNTER — Other Ambulatory Visit: Payer: Medicare Other

## 2013-05-15 DIAGNOSIS — E039 Hypothyroidism, unspecified: Secondary | ICD-10-CM | POA: Diagnosis not present

## 2013-05-16 ENCOUNTER — Other Ambulatory Visit: Payer: Self-pay | Admitting: Geriatric Medicine

## 2013-05-16 DIAGNOSIS — E039 Hypothyroidism, unspecified: Secondary | ICD-10-CM

## 2013-05-16 LAB — TSH: TSH: 1.34 u[IU]/mL (ref 0.450–4.500)

## 2013-05-16 MED ORDER — LEVOTHYROXINE SODIUM 50 MCG PO TABS: 50.0000 ug | ORAL_TABLET | Freq: Every day | ORAL | Status: DC

## 2013-08-01 DIAGNOSIS — Z23 Encounter for immunization: Secondary | ICD-10-CM | POA: Diagnosis not present

## 2013-08-19 ENCOUNTER — Encounter: Payer: Self-pay | Admitting: *Deleted

## 2013-08-20 ENCOUNTER — Encounter: Payer: Self-pay | Admitting: Internal Medicine

## 2013-08-20 ENCOUNTER — Ambulatory Visit (INDEPENDENT_AMBULATORY_CARE_PROVIDER_SITE_OTHER): Payer: Medicare Other | Admitting: Internal Medicine

## 2013-08-20 ENCOUNTER — Telehealth: Payer: Self-pay

## 2013-08-20 VITALS — BP 132/68 | HR 60 | Temp 97.6°F | Resp 16 | Wt 166.8 lb

## 2013-08-20 DIAGNOSIS — K5900 Constipation, unspecified: Secondary | ICD-10-CM | POA: Insufficient documentation

## 2013-08-20 DIAGNOSIS — M899 Disorder of bone, unspecified: Secondary | ICD-10-CM

## 2013-08-20 DIAGNOSIS — E039 Hypothyroidism, unspecified: Secondary | ICD-10-CM

## 2013-08-20 DIAGNOSIS — G2581 Restless legs syndrome: Secondary | ICD-10-CM

## 2013-08-20 HISTORY — DX: Restless legs syndrome: G25.81

## 2013-08-20 HISTORY — DX: Constipation, unspecified: K59.00

## 2013-08-20 MED ORDER — ROPINIROLE HCL 0.25 MG PO TABS: 0.2500 mg | ORAL_TABLET | Freq: Every day | ORAL | Status: DC

## 2013-08-20 NOTE — Progress Notes (Signed)
Patient ID: Colleen [name], female   DOB: [birth year], 67 y.o.   MRN: [id number]  No Known Allergies  Chief Complaint  Patient presents with  . Medical Managment of Chronic Issues    6 month f/u, no labs but fasting this am.  . Leg Problem    RT leg pain @ night     HPI 67 y/o female patient is here for her routine follow up. She complaints of pain in her right leg area ocassionally on and off for few months. Denies cramp like pain. She mentions that she needs to move her legs and it provides some relief. Denies insect crawling feeling. The pain occurs only at night time. Is careful with her meals. Has been compliant with her medication.   Received influenza vaccine 2 weeks ago at [facility]  Review of Systems  Constitutional: Negative for fever, chills, diaphoresis, activity change, appetite change and unexpected weight change.  HENT: Negative for hearing loss, ear pain and congestion.   Eyes: Negative for discharge and visual disturbance.        Wears corrective lenses.  Respiratory: Negative for cough, shortness of breath and wheezing.   Cardiovascular: Negative for chest pain, palpitations and leg swelling.  Gastrointestinal: Positive for constipation. Negative for nausea, vomiting, abdominal pain and blood in stool.        Taking miralax once or twice every other week and this is helpful. Colonoscopy in 2013. Genitourinary: Negative for dysuria, frequency, flank pain, vaginal bleeding, vaginal discharge and difficulty urinating.        Pap smear several years back, s/p complete hysterectomy  Musculoskeletal: Positive for arthralgias. Negative for back pain.       Stable now  Skin: Negative for pallor, rash and wound.  Neurological: Negative for dizziness, syncope, weakness and light-headedness.  Hematological: Negative for adenopathy. Does not bruise/bleed easily.  Psychiatric/Behavioral: Negative for confusion, self-injury and agitation. The patient is not nervous/anxious.      Past  Medical History  Diagnosis Date  . Hypothyroid   . Corns and callosities   . Urge incontinence   . Screening for diabetes mellitus    Past Surgical History  Procedure Laterality Date  . Abdominal hysterectomy  1985    Dr Su Hilt  . Tonsillectomy  1951  . Thyroidectomy  1987    Dr Raechel Ache  . Carpel tunnel Bilateral 2010    Dr Christell Constant  . Right bunion      Dr Ernestine Conrad  . Right rotator cuff repair      Dr Thomasena Edis  . Cesarean section  1983   Current Outpatient Prescriptions on File Prior to Visit  Medication Sig Dispense Refill  . Ascorbic Acid (VITAMIN C PO) Take 1 tablet by mouth daily.      Marland Kitchen aspirin 81 MG tablet Take 81 mg by mouth daily.      Marland Kitchen CALCIUM-VITAMIN D PO Take 1 tablet by mouth daily.      Marland Kitchen levothyroxine (SYNTHROID, LEVOTHROID) 50 MCG tablet Take 1 tablet (50 mcg total) by mouth daily.  90 tablet  3  . Multiple Vitamin (MULTIVITAMIN WITH MINERALS) TABS Take 1 tablet by mouth daily.      . polyethylene glycol (MIRALAX / GLYCOLAX) packet Take 17 g by mouth daily as needed.       No current facility-administered medications on file prior to visit.    Physical Exam  Vitals reviewed. BP 132/68  Pulse 60  Temp(Src) 97.6 F (36.4 C) (Oral)  Resp 16  Wt  166 lb 12.8 oz (75.66 kg)  BMI 31.01 kg/m2  Constitutional: She is oriented to person, place, and time. She appears well-developed and well-nourished. No distress.  HENT:   Head: Normocephalic and atraumatic.  Nose: Nose normal.   Mouth/Throat: Oropharynx is clear and moist. No oropharyngeal exudate.  Eyes: Conjunctivae and EOM are normal. Pupils are equal, round, and reactive to light. Right eye exhibits no discharge. Left eye exhibits no discharge. No scleral icterus.  Neck: Normal range of motion. Neck supple. No JVD present. No tracheal deviation present.  Cardiovascular: Normal rate, regular rhythm, normal heart sounds and intact distal pulses.    No murmur heard. Pulmonary/Chest: Effort normal and breath  sounds normal. No stridor. No respiratory distress. She has no wheezes. She has no rales. She exhibits no tenderness.  Abdominal: Soft. Bowel sounds are normal. She exhibits no distension and no mass. There is no tenderness. There is no rebound and no guarding.  Musculoskeletal: Normal range of motion. She exhibits no edema and no tenderness.  Lymphadenopathy:    She has no cervical adenopathy.  Neurological: She is alert and oriented to person, place, and time. No cranial nerve deficit. Coordination normal.  Skin: Skin is warm and dry. No rash noted. She is not diaphoretic. No erythema. No pallor.  Has callus in both heels and seen by podiatry Psychiatric: She has a normal mood and affect. Her behavior is normal. Judgment and thought content normal  Labs- no recent labs  Assessment/plan  Restless leg syndrome- with intermittent pain/ discomfort in her right leg and this occuring only at night time and helped some by moving her leg, concerns for RLS. Will check bmp to rule out electrolyte abnormality. Will start her on requip 0.25 mg daily for now and reassess. No history of anemia in the past, recheck cbc to rule out anemia  Bone and cartilage disorder- check dexa scan. Continue ca-vit d supplement  Hypothyroidism- continue levothyroxine 50 mcg daily for now. Recheck tsh today and adjust dose if needed  Constipation- under control with prn miralax, monitor- encouraged fiber intake  Labs- cbc, tsh, bmp, dexa scan

## 2013-08-21 LAB — CBC WITH DIFFERENTIAL/PLATELET
Basophils Absolute: 0 10*3/uL (ref 0.0–0.2)
Basos: 1 %
Eos: 1 %
Eosinophils Absolute: 0 10*3/uL (ref 0.0–0.4)
HCT: 39.9 % (ref 34.0–46.6)
Hemoglobin: 12.7 g/dL (ref 11.1–15.9)
Immature Grans (Abs): 0 10*3/uL (ref 0.0–0.1)
Immature Granulocytes: 0 %
Lymphocytes Absolute: 1.9 10*3/uL (ref 0.7–3.1)
Lymphs: 55 %
MCH: 29.8 pg (ref 26.6–33.0)
MCHC: 31.8 g/dL (ref 31.5–35.7)
MCV: 94 fL (ref 79–97)
Monocytes Absolute: 0.2 10*3/uL (ref 0.1–0.9)
Monocytes: 6 %
Neutrophils Absolute: 1.3 10*3/uL — ABNORMAL LOW (ref 1.4–7.0)
Neutrophils Relative %: 37 %
RBC: 4.26 x10E6/uL (ref 3.77–5.28)
RDW: 14 % (ref 12.3–15.4)
WBC: 3.5 10*3/uL (ref 3.4–10.8)

## 2013-08-21 LAB — BASIC METABOLIC PANEL
BUN/Creatinine Ratio: 16 (ref 11–26)
BUN: 14 mg/dL (ref 8–27)
CO2: 27 mmol/L (ref 18–29)
Calcium: 9.7 mg/dL (ref 8.6–10.2)
Chloride: 103 mmol/L (ref 97–108)
Creatinine, Ser: 0.89 mg/dL (ref 0.57–1.00)
GFR calc Af Amer: 76 mL/min/{1.73_m2} (ref >59)
GFR calc non Af Amer: 66 mL/min/{1.73_m2} (ref >59)
Glucose: 83 mg/dL (ref 65–99)
Potassium: 4.6 mmol/L (ref 3.5–5.2)
Sodium: 147 mmol/L — ABNORMAL HIGH (ref 134–144)

## 2013-08-21 LAB — TSH: TSH: 0.898 u[IU]/mL (ref 0.450–4.500)

## 2013-08-21 NOTE — Telephone Encounter (Signed)
Spoke with patient, patient does not recall ever having BMD, patient states if she did it was a long [place]

## 2013-09-10 ENCOUNTER — Encounter: Payer: Self-pay | Admitting: Internal Medicine

## 2013-09-10 ENCOUNTER — Ambulatory Visit (INDEPENDENT_AMBULATORY_CARE_PROVIDER_SITE_OTHER): Payer: Medicare Other | Admitting: Internal Medicine

## 2013-09-10 VITALS — BP 122/78 | HR 78 | Temp 98.0°F | Wt 170.8 lb

## 2013-09-10 DIAGNOSIS — K5900 Constipation, unspecified: Secondary | ICD-10-CM | POA: Diagnosis not present

## 2013-09-10 DIAGNOSIS — E039 Hypothyroidism, unspecified: Secondary | ICD-10-CM

## 2013-09-10 DIAGNOSIS — G2581 Restless legs syndrome: Secondary | ICD-10-CM

## 2013-09-10 MED ORDER — ROPINIROLE HCL 0.25 MG PO TABS: 0.2500 mg | ORAL_TABLET | Freq: Every day | ORAL | Status: DC

## 2013-09-10 NOTE — Progress Notes (Signed)
Patient ID: Colleen [name], female   DOB: 04/11/1946, 67 y.o.   MRN: [id number]  Chief Complaint  Patient presents with  . Follow-up    3 week f/u for leg pain   No Known Allergies  HPI 67 y/o female patient is here for follow up on her leg pain. Her pain has resolved for now after taking the requip. No further pain/ discomfort.   Review of Systems   Constitutional: Negative for fever, chills, diaphoresis, activity change, appetite change and unexpected weight change.   HENT: Negative for hearing loss, ear pain and congestion.    Eyes: Negative for discharge and visual disturbance.        Wears corrective lenses.   Respiratory: Negative for cough, shortness of breath and wheezing.    Cardiovascular: Negative for chest pain, palpitations and leg swelling.   Gastrointestinal: Positive for constipation. Negative for nausea, vomiting, abdominal pain and blood in stool.       Taking miralax once or twice every other week and this is helpful. Colonoscopy in 2013. Genitourinary: Negative for dysuria, frequency, flank pain, vaginal bleeding, vaginal discharge and difficulty urinating.        Pap smear several years back, s/p complete hysterectomy   Musculoskeletal: Positive for arthralgias. Negative for back pain.       Stable now   Skin: Negative for pallor, rash and wound.   Neurological: Negative for dizziness, syncope, weakness and light-headedness.   Hematological: Negative for adenopathy. Does not bruise/bleed easily.   Psychiatric/Behavioral: Negative for confusion, self-injury and agitation. The patient is not nervous/anxious.    Past Medical History  Diagnosis Date  . Hypothyroid   . Corns and callosities   . Urge incontinence   . Screening for diabetes mellitus    Medication reviewed. See MAR  BP 122/78  Pulse 78  Temp(Src) 98 F (36.7 C) (Oral)  Wt 170 lb 12.8 oz (77.474 kg)  SpO2 99%  Physical Exam  Vitals reviewed. Constitutional: She is oriented to person, place, and  time. She appears well-developed and well-nourished. No distress.  Cardiovascular: Normal rate, regular rhythm, normal heart sounds and intact distal pulses.    No murmur heard. Pulmonary/Chest: Effort normal and breath sounds normal. No stridor. No respiratory distress. She has no wheezes. She has no rales. She exhibits no tenderness.  Abdominal: Soft. Bowel sounds are normal. She exhibits no distension and no mass. There is no tenderness. There is no rebound and no guarding.  Musculoskeletal: Normal range of motion. She exhibits no edema and no tenderness.  Neurological: She is alert and oriented to person, place, and time. No cranial nerve deficit. Coordination normal.  Psychiatric: She has a normal mood and affect. Her behavior is normal. Judgment and thought content normal.   Labs- CBC    Component Value Date/Time   WBC 3.5 08/20/2013 1007   RBC 4.26 08/20/2013 1007   HGB 12.7 08/20/2013 1007   HCT 39.9 08/20/2013 1007   MCV 94 08/20/2013 1007   MCH 29.8 08/20/2013 1007   MCHC 31.8 08/20/2013 1007   RDW 14.0 08/20/2013 1007   LYMPHSABS 1.9 08/20/2013 1007   EOSABS 0.0 08/20/2013 1007   BASOSABS 0.0 08/20/2013 1007   .Assessment/plan  Restless leg syndrome- much imporved with requip 0.25 mg daily. Will continue this dose for now as her symptoms are under control  Hypothyroidism- continue levothyroxine 50 mcg daily for now. Monitor clinically and check tsh prior to next visit. reviewed last tsh which has imporved  Constipation- under  control, good fiber intake. Has not required any miralax recently.  Monitor clinically

## 2013-09-11 ENCOUNTER — Ambulatory Visit: Payer: Medicare Other | Admitting: Internal Medicine

## 2013-09-24 ENCOUNTER — Ambulatory Visit
Admission: RE | Admit: 2013-09-24 | Discharge: 2013-09-24 | Disposition: A | Discharge status: Home / Self Care | Payer: Managed Care, Other (non HMO) | Source: Ambulatory Visit | Attending: Internal Medicine | Admitting: Internal Medicine

## 2013-09-24 DIAGNOSIS — M899 Disorder of bone, unspecified: Secondary | ICD-10-CM | POA: Diagnosis not present

## 2013-10-08 ENCOUNTER — Telehealth: Payer: Self-pay | Admitting: *Deleted

## 2013-10-08 NOTE — Telephone Encounter (Signed)
Received patient's Bone Density Report from [facility] # [phone] WHO Classification: Osteopenia Per Dr. Leland Her have osteopenia-thinning out of your bones. To take calcium vitamin D 600-400 Take one tablet twice daily. 10/08/2013 Patient Notified.

## 2013-11-11 ENCOUNTER — Other Ambulatory Visit: Payer: Self-pay

## 2013-11-11 DIAGNOSIS — Z1231 Encounter for screening mammogram for malignant neoplasm of breast: Secondary | ICD-10-CM

## 2013-12-16 ENCOUNTER — Ambulatory Visit: Payer: Medicare Other

## 2013-12-20 ENCOUNTER — Ambulatory Visit
Admission: RE | Admit: 2013-12-20 | Discharge: 2013-12-20 | Disposition: A | Discharge status: Home / Self Care | Payer: Medicare HMO | Source: Ambulatory Visit

## 2013-12-20 DIAGNOSIS — Z1231 Encounter for screening mammogram for malignant neoplasm of breast: Secondary | ICD-10-CM | POA: Diagnosis not present

## 2014-01-06 ENCOUNTER — Other Ambulatory Visit: Payer: Medicare Other

## 2014-01-06 DIAGNOSIS — E039 Hypothyroidism, unspecified: Secondary | ICD-10-CM

## 2014-01-07 LAB — BASIC METABOLIC PANEL
BUN/Creatinine Ratio: 14 (ref 11–26)
BUN: 15 mg/dL (ref 8–27)
CO2: 23 mmol/L (ref 18–29)
Calcium: 9.4 mg/dL (ref 8.7–10.3)
Chloride: 106 mmol/L (ref 97–108)
Creatinine, Ser: 1.04 mg/dL — ABNORMAL HIGH (ref 0.57–1.00)
GFR calc Af Amer: 63 mL/min/{1.73_m2} (ref >59)
GFR calc non Af Amer: 55 mL/min/{1.73_m2} — ABNORMAL LOW (ref >59)
Glucose: 87 mg/dL (ref 65–99)
Potassium: 4 mmol/L (ref 3.5–5.2)
Sodium: 147 mmol/L — ABNORMAL HIGH (ref 134–144)

## 2014-01-07 LAB — TSH: TSH: 1.11 u[IU]/mL (ref 0.450–4.500)

## 2014-01-08 ENCOUNTER — Ambulatory Visit: Payer: Medicare Other | Admitting: Internal Medicine

## 2014-01-08 ENCOUNTER — Encounter: Payer: Self-pay | Admitting: Internal Medicine

## 2014-01-08 ENCOUNTER — Ambulatory Visit (INDEPENDENT_AMBULATORY_CARE_PROVIDER_SITE_OTHER): Payer: Medicare Other | Admitting: Internal Medicine

## 2014-01-08 VITALS — BP 124/70 | HR 60 | Temp 98.0°F | Resp 14 | Wt 174.2 lb

## 2014-01-08 DIAGNOSIS — N179 Acute kidney failure, unspecified: Secondary | ICD-10-CM

## 2014-01-08 DIAGNOSIS — M8580 Other specified disorders of bone density and structure, unspecified site: Secondary | ICD-10-CM | POA: Insufficient documentation

## 2014-01-08 DIAGNOSIS — M949 Disorder of cartilage, unspecified: Secondary | ICD-10-CM | POA: Diagnosis not present

## 2014-01-08 DIAGNOSIS — M899 Disorder of bone, unspecified: Secondary | ICD-10-CM | POA: Diagnosis not present

## 2014-01-08 DIAGNOSIS — G2581 Restless legs syndrome: Secondary | ICD-10-CM

## 2014-01-08 DIAGNOSIS — K5900 Constipation, unspecified: Secondary | ICD-10-CM

## 2014-01-08 DIAGNOSIS — E039 Hypothyroidism, unspecified: Secondary | ICD-10-CM

## 2014-01-08 HISTORY — DX: Other specified disorders of bone density and structure, unspecified site: M85.80

## 2014-01-08 NOTE — Progress Notes (Signed)
Patient ID: Colleen [name], female   DOB: 11-May-1946, 68 y.o.   MRN: [id number]    Chief Complaint  Patient presents with  . Medical Managment of Chronic Issues    4 month follow up   No Known Allergies  HPI 68 y/o female patient is here for routine follow up. Her leg discomfort has improved Her hands have been cold at times but denies any pain or discoloration Renal function slightly impaired on lab review Has been taking prn aleve for now for pain  Review of Systems  Constitutional: Negative for fever, chills, weight loss, malaise/fatigue and diaphoresis.  HENT: Negative for congestion, hearing loss and sore throat.   Eyes: Negative for blurred vision, double vision and discharge.  Respiratory: Negative for cough, sputum production, shortness of breath and wheezing.   Cardiovascular: Negative for chest pain, palpitations, orthopnea and leg swelling.  Gastrointestinal: Negative for heartburn, nausea, vomiting, abdominal pain, diarrhea and constipation.  Genitourinary: Negative for dysuria, urgency, frequency and flank pain.  Musculoskeletal: Negative for back pain, falls, joint pain and myalgias.  Skin: Negative for itching and rash.  Neurological:  Negative for dizziness, tingling, focal weakness and headaches.  Psychiatric/Behavioral: Negative for depression and memory loss. The patient is not nervous/anxious.    Past Medical History  Diagnosis Date  . Hypothyroid   . Corns and callosities   . Urge incontinence   . Screening for diabetes mellitus    Past Surgical History  Procedure Laterality Date  . Abdominal hysterectomy  1985    Dr Mancel Bale  . Tonsillectomy  1951  . Thyroidectomy  1987    Dr Guadlupe Spanish  . Carpel tunnel Bilateral 2010    Dr Laurance Flatten  . Right bunion      Dr Jomarie Longs  . Right rotator cuff repair      Dr Theda Sers  . Cesarean section  1983   Current Outpatient Prescriptions on File Prior to Visit  Medication Sig Dispense Refill  . Ascorbic Acid (VITAMIN C  PO) Take 1 tablet by mouth daily.      Marland Kitchen aspirin 81 MG tablet Take 81 mg by mouth daily.      Marland Kitchen CALCIUM-VITAMIN D PO Take 2 tablets by mouth daily.       Marland Kitchen levothyroxine (SYNTHROID, LEVOTHROID) 50 MCG tablet Take 1 tablet (50 mcg total) by mouth daily.  90 tablet  3  . Multiple Vitamin (MULTIVITAMIN WITH MINERALS) TABS Take 1 tablet by mouth daily.      . polyethylene glycol (MIRALAX / GLYCOLAX) packet Take 17 g by mouth daily as needed.      Marland Kitchen rOPINIRole (REQUIP) 0.25 MG tablet Take 1 tablet (0.25 mg total) by mouth daily after supper. 1-2 hour before going to bed  90 tablet  3   No current facility-administered medications on file prior to visit.    Physical exam  BP 124/70  Pulse 60  Temp(Src) 98 F (36.7 C) (Oral)  Resp 14  Wt 174 lb 3.2 oz (79.017 kg)  SpO2 98%  Constitutional: She is oriented to person, place, and time. She appears well-developed and well-nourished. No distress.   HENT:   Head: Normocephalic and atraumatic.   Nose: Nose normal.   Cardiovascular: Normal rate, regular rhythm, normal heart sounds and intact distal pulses.    No murmur heard. Pulmonary/Chest: Effort normal and breath sounds normal. No stridor. No respiratory distress. She has no wheezes. She has no rales. She exhibits no tenderness.   Abdominal: Soft. Bowel sounds are normal.  She exhibits no distension and no mass. There is no tenderness. There is no rebound and no guarding.  Musculoskeletal: Normal range of motion. She exhibits no edema and no tenderness.  Lymphadenopathy:    She has no cervical adenopathy.  Neurological: She is alert and oriented to person, place, and time. No cranial nerve deficit. Coordination normal.   Skin: Skin is warm and dry. No rash noted. She is not diaphoretic. No erythema. No pallor.  Psychiatric: She has a normal mood and affect. Her behavior is normal. Judgment and thought content normal   Labs-  Lab Results  Component Value Date   TSH 1.110 01/06/2014      CMP     Component Value Date/Time   NA 147* 01/06/2014 0807   K 4.0 01/06/2014 0807   CL 106 01/06/2014 0807   CO2 23 01/06/2014 0807   GLUCOSE 87 01/06/2014 0807   BUN 15 01/06/2014 0807   CREATININE 1.04* 01/06/2014 0807   CALCIUM 9.4 01/06/2014 0807   PROT 6.3 02/13/2013 1048   AST 30 02/13/2013 1048   ALT 19 02/13/2013 1048   ALKPHOS 68 02/13/2013 1048   BILITOT 0.3 02/13/2013 1048   GFRNONAA 55* 01/06/2014 0807   GFRAA 63 01/06/2014 0807   Assessment/plan  1. Hypothyroidism Continue levothyroxine 50 mcg daily - Basic Metabolic Panel; Future - CMP; Future - Lipid Panel; Future - CBC with Differential; Future - Vitamin D, 1,25-dihydroxy; Future - TSH; Future  2. Unspecified constipation Prn miralax, encouraged hydration and fiber intake  3. Restless leg syndrome Continue requip at current dose for now, monitor  4. Osteopenia Reviewed dexa scan. Continue ca-vit d for now - Vitamin D, 1,25-dihydroxy; Future  5. Acute renal failure - Basic Metabolic Panel; Future - Stop aleve/ advil/ naproxen/ ibuprofen - incouraged hydration - can take tylenol if needed for pain

## 2014-01-08 NOTE — Patient Instructions (Signed)
DO NOT TAKE IBUPROFEN/ ALEVE/ ADVIL for pain  You can take tylenol

## 2014-02-05 ENCOUNTER — Other Ambulatory Visit: Payer: Medicare Other

## 2014-02-05 DIAGNOSIS — N179 Acute kidney failure, unspecified: Secondary | ICD-10-CM

## 2014-02-05 DIAGNOSIS — E039 Hypothyroidism, unspecified: Secondary | ICD-10-CM

## 2014-02-06 LAB — BASIC METABOLIC PANEL
BUN/Creatinine Ratio: 19 (ref 11–26)
BUN: 21 mg/dL (ref 8–27)
CO2: 28 mmol/L (ref 18–29)
Calcium: 10.2 mg/dL (ref 8.7–10.3)
Chloride: 106 mmol/L (ref 97–108)
Creatinine, Ser: 1.11 mg/dL — ABNORMAL HIGH (ref 0.57–1.00)
GFR calc Af Amer: 59 mL/min/{1.73_m2} — ABNORMAL LOW (ref >59)
GFR calc non Af Amer: 51 mL/min/{1.73_m2} — ABNORMAL LOW (ref >59)
Glucose: 90 mg/dL (ref 65–99)
Potassium: 5.4 mmol/L — ABNORMAL HIGH (ref 3.5–5.2)
Sodium: 146 mmol/L — ABNORMAL HIGH (ref 134–144)

## 2014-02-07 ENCOUNTER — Other Ambulatory Visit: Payer: Self-pay | Admitting: Internal Medicine

## 2014-02-07 DIAGNOSIS — E870 Hyperosmolality and hypernatremia: Secondary | ICD-10-CM

## 2014-02-07 DIAGNOSIS — E875 Hyperkalemia: Secondary | ICD-10-CM

## 2014-02-20 ENCOUNTER — Other Ambulatory Visit: Payer: Medicare Other

## 2014-02-20 DIAGNOSIS — E875 Hyperkalemia: Secondary | ICD-10-CM

## 2014-02-20 DIAGNOSIS — E870 Hyperosmolality and hypernatremia: Secondary | ICD-10-CM | POA: Diagnosis not present

## 2014-02-21 ENCOUNTER — Other Ambulatory Visit: Payer: Self-pay

## 2014-02-21 LAB — BASIC METABOLIC PANEL
BUN/Creatinine Ratio: 9 — ABNORMAL LOW (ref 11–26)
BUN: 10 mg/dL (ref 8–27)
CO2: 27 mmol/L (ref 18–29)
Calcium: 9.6 mg/dL (ref 8.7–10.3)
Chloride: 105 mmol/L (ref 97–108)
Creatinine, Ser: 1.13 mg/dL — ABNORMAL HIGH (ref 0.57–1.00)
GFR calc Af Amer: 57 mL/min/{1.73_m2} — ABNORMAL LOW (ref >59)
GFR calc non Af Amer: 50 mL/min/{1.73_m2} — ABNORMAL LOW (ref >59)
Glucose: 92 mg/dL (ref 65–99)
Potassium: 4.3 mmol/L (ref 3.5–5.2)
Sodium: 145 mmol/L — ABNORMAL HIGH (ref 134–144)

## 2014-03-08 DIAGNOSIS — H40039 Anatomical narrow angle, unspecified eye: Secondary | ICD-10-CM | POA: Diagnosis not present

## 2014-03-08 DIAGNOSIS — H2510 Age-related nuclear cataract, unspecified eye: Secondary | ICD-10-CM | POA: Diagnosis not present

## 2014-03-15 DIAGNOSIS — H16229 Keratoconjunctivitis sicca, not specified as Sjogren's, unspecified eye: Secondary | ICD-10-CM | POA: Diagnosis not present

## 2014-04-30 ENCOUNTER — Ambulatory Visit (INDEPENDENT_AMBULATORY_CARE_PROVIDER_SITE_OTHER): Payer: Medicare Other | Admitting: Internal Medicine

## 2014-04-30 ENCOUNTER — Encounter: Payer: Self-pay | Admitting: Internal Medicine

## 2014-04-30 VITALS — BP 130/78 | HR 75 | Temp 97.9°F | Resp 10 | Wt 177.0 lb

## 2014-04-30 DIAGNOSIS — R12 Heartburn: Secondary | ICD-10-CM | POA: Insufficient documentation

## 2014-04-30 DIAGNOSIS — R51 Headache: Secondary | ICD-10-CM

## 2014-04-30 DIAGNOSIS — N644 Mastodynia: Secondary | ICD-10-CM

## 2014-04-30 HISTORY — DX: Heartburn: R12

## 2014-04-30 MED ORDER — OMEPRAZOLE 20 MG PO CPDR: 20.0000 mg | DELAYED_RELEASE_CAPSULE | Freq: Every day | ORAL | Status: DC

## 2014-04-30 MED ORDER — LEVOTHYROXINE SODIUM 50 MCG PO TABS: 50.0000 ug | ORAL_TABLET | Freq: Every day | ORAL | Status: DC

## 2014-04-30 NOTE — Progress Notes (Signed)
Patient ID: Colleen [name], female   DOB: 14-Jan-1946, 68 y.o.   MRN: [id number]    No Known Allergies  Chief Complaint  Patient presents with  . Breast Problem    Right breast concern  . Headache    for a month   . Heartburn    burning sensation in chest  . Hand Problem    Patient with discoloration in all nail-bed x 1 month or longer    HPI 68 y/o female patient is here for acute visit with concerns. She has been having weird sensation in right breast with discomfort on and off radiating to under her right arm since January. She had mammogram feb 2015 which is normal. No pain in centre of chest or left side. Denies noticing any rash or skin breakout in her skin. Has been palpating and self examining her breast- has not noticed any lump. Denies any trauma. Pain is noticed by her at rest. She is concerned about having breast cancer Denies any chest pain or dyspnea with it  She recently had her vision checked and got new glasses. She has been having headache an episode in a week or 2 weeks which last for few minutes to half an hour and then resolves with rest or sometimes with tylenol. The headache is in her frontal area without radiation elsewhere or neck. No lacrimation  She has also noticed heartburn when she lies down and mainly at night. She gets up, and that helps some but burping and walking makes it better otherwise but does not completely go away  She is also concerned about all her nail beds and feels they are getting dicolored  Review of Systems  Constitutional: Negative for fever, chills, weight loss, malaise/fatigue and diaphoresis.  HENT: Negative for congestion, hearing loss and sore throat.   Eyes: Negative for blurred vision, double vision and discharge.  Respiratory: Negative for cough, sputum production, shortness of breath and wheezing.   Cardiovascular: Negative for chest pain, palpitations, orthopnea and leg swelling.  Gastrointestinal: Negative for nausea, vomiting,  abdominal pain, diarrhea  Genitourinary: Negative for dysuria, urgency, frequency and flank pain.  Musculoskeletal: Negative for back pain, falls, joint pain and myalgias.  Skin: Negative for itching and rash.  Neurological: Negative for dizziness, tingling, focal weakness and headaches.  Psychiatric/Behavioral: Negative for depression and memory loss.   Past Medical History  Diagnosis Date  . Hypothyroid   . Corns and callosities   . Urge incontinence   . Screening for diabetes mellitus    Current Outpatient Prescriptions on File Prior to Visit  Medication Sig Dispense Refill  . Ascorbic Acid (VITAMIN C PO) Take 1 tablet by mouth daily.      Marland Kitchen aspirin 81 MG tablet Take 81 mg by mouth daily.      Marland Kitchen CALCIUM-VITAMIN D PO Take 2 tablets by mouth daily.       . Multiple Vitamin (MULTIVITAMIN WITH MINERALS) TABS Take 1 tablet by mouth daily.      . polyethylene glycol (MIRALAX / GLYCOLAX) packet Take 17 g by mouth daily as needed.      Marland Kitchen rOPINIRole (REQUIP) 0.25 MG tablet Take 1 tablet (0.25 mg total) by mouth daily after supper. 1-2 hour before going to bed  90 tablet  3   No current facility-administered medications on file prior to visit.    Physical exam BP 130/78  Pulse 75  Temp(Src) 97.9 F (36.6 C) (Oral)  Resp 10  Wt 177 lb (80.287 kg)  SpO2 97%  General- elderly female in no acute distress Head- atraumatic, normocephalic, no temporal tenderness Eyes- PERRLA, EOMI, no pallor, no icterus, no discharge, has glasses Neck- no lymphadenopathy, no thyromegaly, no jugular vein distension, no carotid bruit Nose- normal nasaal mucosa, no maxillary sinus tenderness, no frontal sinus tenderness Mouth- normal mucus membrane, no oral thrush, normal oropharynx Chest- no chest wall deformities, no chest wall tenderness Breast- no masses, no palpable lumps, normal nipple and areola exam, no axillary lymphadenopathy Cardiovascular- normal s1,s2, no murmurs/ rubs/ gallops, normal distal  pulses Respiratory- bilateral clear to auscultation, no wheeze, no rhonchi, no crackles Abdomen- bowel sounds present, soft, non tender, no guarding or rigidity, no CVA tenderness Musculoskeletal- able to move all 4 extremities, no spinal and paraspinal tenderness, steady gait, no use of assistive device, normal range of motion, no leg edema, nail beds have cuticles and that is what patient is pointing to saying it is not normal  Neurological- no focal deficit Skin- warm and dry, no rash or lesions noted Psychiatry- alert and oriented to person, place and time, normal mood and affect  Lab Results  Component Value Date   TSH 1.110 01/06/2014    Assessment/plan  1. Heartburn Will have her on omeprazole 20 mg po daily and reassess. Advised on avoiding late night meals  2. Headache(784.0) Likely from her new glasses- vision adjustment to new power in the glasses. No temporal tenderness ruling out temporal arteritis. The headache is once a week with some less rarer than that and is present for few minutes. Advised to use tylenol as needed for now and monitor clinically.will get further evaluation if this persists or worsens  3. Pain of right breast Unclear of the cause at present. Normal cardiac exam. Normal mammogram. No reproducible pain. No cardiac or pulmonary associated symptoms.  No evidence of shingles or history of shingles. Has fibroglandular breast tissue and this could at times cause some discomfort. Normal breast xam in office today. Reassess if persists or worsens

## 2014-07-04 ENCOUNTER — Encounter (HOSPITAL_COMMUNITY): Payer: Self-pay | Admitting: Emergency Medicine

## 2014-07-04 ENCOUNTER — Observation Stay (HOSPITAL_COMMUNITY)
Admission: EM | Admit: 2014-07-04 | Discharge: 2014-07-05 | Disposition: A | Discharge status: Home / Self Care | Payer: Medicare Other | Attending: Emergency Medicine | Admitting: Emergency Medicine

## 2014-07-04 ENCOUNTER — Emergency Department (HOSPITAL_COMMUNITY): Payer: Medicare Other

## 2014-07-04 ENCOUNTER — Other Ambulatory Visit: Payer: Medicare Other

## 2014-07-04 DIAGNOSIS — Z90710 Acquired absence of both cervix and uterus: Secondary | ICD-10-CM | POA: Diagnosis present

## 2014-07-04 DIAGNOSIS — M8580 Other specified disorders of bone density and structure, unspecified site: Secondary | ICD-10-CM | POA: Diagnosis present

## 2014-07-04 DIAGNOSIS — L84 Corns and callosities: Secondary | ICD-10-CM | POA: Insufficient documentation

## 2014-07-04 DIAGNOSIS — E039 Hypothyroidism, unspecified: Secondary | ICD-10-CM | POA: Diagnosis not present

## 2014-07-04 DIAGNOSIS — R079 Chest pain, unspecified: Secondary | ICD-10-CM | POA: Diagnosis present

## 2014-07-04 DIAGNOSIS — I498 Other specified cardiac arrhythmias: Secondary | ICD-10-CM | POA: Diagnosis not present

## 2014-07-04 DIAGNOSIS — Z79899 Other long term (current) drug therapy: Secondary | ICD-10-CM | POA: Diagnosis not present

## 2014-07-04 DIAGNOSIS — Z87891 Personal history of nicotine dependence: Secondary | ICD-10-CM | POA: Insufficient documentation

## 2014-07-04 DIAGNOSIS — Z7982 Long term (current) use of aspirin: Secondary | ICD-10-CM | POA: Insufficient documentation

## 2014-07-04 DIAGNOSIS — R0789 Other chest pain: Secondary | ICD-10-CM | POA: Diagnosis not present

## 2014-07-04 DIAGNOSIS — R001 Bradycardia, unspecified: Secondary | ICD-10-CM

## 2014-07-04 LAB — I-STAT TROPONIN, ED: Troponin i, poc: 0.01 ng/mL (ref 0.00–0.08)

## 2014-07-04 LAB — BASIC METABOLIC PANEL
Anion gap: 13 (ref 5–15)
BUN: 12 mg/dL (ref 6–23)
CO2: 25 mEq/L (ref 19–32)
Calcium: 9.3 mg/dL (ref 8.4–10.5)
Chloride: 106 mEq/L (ref 96–112)
Creatinine, Ser: 0.97 mg/dL (ref 0.50–1.10)
GFR calc Af Amer: 67 mL/min — ABNORMAL LOW (ref >90)
GFR calc non Af Amer: 58 mL/min — ABNORMAL LOW (ref >90)
Glucose, Bld: 89 mg/dL (ref 70–99)
Potassium: 4.2 mEq/L (ref 3.7–5.3)
Sodium: 144 mEq/L (ref 137–147)

## 2014-07-04 LAB — CBC
HCT: 37.4 % (ref 36.0–46.0)
Hemoglobin: 12.3 g/dL (ref 12.0–15.0)
MCH: 30.6 pg (ref 26.0–34.0)
MCHC: 32.9 g/dL (ref 30.0–36.0)
MCV: 93 fL (ref 78.0–100.0)
Platelets: 238 10*3/uL (ref 150–400)
RBC: 4.02 MIL/uL (ref 3.87–5.11)
RDW: 13.3 % (ref 11.5–15.5)
WBC: 3.7 10*3/uL — ABNORMAL LOW (ref 4.0–10.5)

## 2014-07-04 LAB — TSH: TSH: 2.19 u[IU]/mL (ref 0.350–4.500)

## 2014-07-04 LAB — TROPONIN I
Troponin I: <0.3 ng/mL (ref <0.30)
Troponin I: <0.3 ng/mL (ref <0.30)

## 2014-07-04 LAB — D-DIMER, QUANTITATIVE (NOT AT ARMC): D-Dimer, Quant: 0.51 ug/mL-FEU — ABNORMAL HIGH (ref 0.00–0.48)

## 2014-07-04 MED ORDER — HEPARIN SODIUM (PORCINE) 5000 UNIT/ML IJ SOLN
5000.0000 [IU] | Freq: Three times a day (TID) | INTRAMUSCULAR | Status: DC
  Administered 2014-07-04 – 2014-07-05 (×2): 5000 [IU] via SUBCUTANEOUS
  Filled 2014-07-04 (×3): qty 1

## 2014-07-04 MED ORDER — SODIUM CHLORIDE 0.9 % IJ SOLN: 3.0000 mL | INTRAMUSCULAR | Status: DC | PRN

## 2014-07-04 MED ORDER — GI COCKTAIL ~~LOC~~
30.0000 mL | Freq: Once | ORAL | Status: AC
  Administered 2014-07-04: 30 mL via ORAL
  Filled 2014-07-04: qty 30

## 2014-07-04 MED ORDER — SODIUM CHLORIDE 0.9 % IV SOLN: 250.0000 mL | INTRAVENOUS | Status: DC | PRN

## 2014-07-04 MED ORDER — PANTOPRAZOLE SODIUM 40 MG PO TBEC
40.0000 mg | DELAYED_RELEASE_TABLET | Freq: Two times a day (BID) | ORAL | Status: DC
  Administered 2014-07-04 – 2014-07-05 (×2): 40 mg via ORAL
  Filled 2014-07-04 (×2): qty 1

## 2014-07-04 MED ORDER — NITROGLYCERIN 0.4 MG SL SUBL: 0.4000 mg | SUBLINGUAL_TABLET | SUBLINGUAL | Status: DC | PRN

## 2014-07-04 MED ORDER — ASPIRIN 300 MG RE SUPP: 300.0000 mg | RECTAL | Status: AC

## 2014-07-04 MED ORDER — SODIUM CHLORIDE 0.9 % IJ SOLN: 3.0000 mL | Freq: Two times a day (BID) | INTRAMUSCULAR | Status: DC

## 2014-07-04 MED ORDER — ACETAMINOPHEN 325 MG PO TABS
650.0000 mg | ORAL_TABLET | ORAL | Status: DC | PRN
  Administered 2014-07-04: 650 mg via ORAL
  Filled 2014-07-04: qty 2

## 2014-07-04 MED ORDER — GI COCKTAIL ~~LOC~~
30.0000 mL | Freq: Three times a day (TID) | ORAL | Status: DC | PRN
  Administered 2014-07-04: 30 mL via ORAL
  Filled 2014-07-04: qty 30

## 2014-07-04 MED ORDER — ONDANSETRON HCL 4 MG/2ML IJ SOLN: 4.0000 mg | Freq: Four times a day (QID) | INTRAMUSCULAR | Status: DC | PRN

## 2014-07-04 MED ORDER — ASPIRIN EC 81 MG PO TBEC
81.0000 mg | DELAYED_RELEASE_TABLET | Freq: Every day | ORAL | Status: DC
  Administered 2014-07-05: 81 mg via ORAL
  Filled 2014-07-04: qty 1

## 2014-07-04 MED ORDER — ASPIRIN 81 MG PO CHEW
324.0000 mg | CHEWABLE_TABLET | ORAL | Status: AC
  Administered 2014-07-04: 324 mg via ORAL
  Filled 2014-07-04: qty 4

## 2014-07-04 NOTE — ED Provider Notes (Signed)
CSN: [id number]     Arrival date & time 07/04/14  0856 History   First MD Initiated Contact with Patient 07/04/14 [phone]     Chief Complaint  Patient presents with  . Chest Pain     (Consider location/radiation/quality/duration/timing/severity/associated sxs/prior Treatment) HPI  Colleen [name] is a 68 y.o. female is here for evaluation of chest discomfort, which began yesterday. It is felt in the center of her chest, yesterday, was lasting 20 minutes, then resolved spontaneously. She had numerous episodes. They tend to improve with belching. The discomfort is pressure-like and radiates to her mid back. This morning, the discomfort as a heavy feeling and is persistent in the center of her chest. She rates the pain 5/10 . She took an extra aspirin yesterday, 325 mg, but none this morning. She did not take any morning medications. She has a history of GERD and is treated with omeprazole. She's never had endoscopy.  No similar symptoms in the past. No cardiac history. She denies cough, shortness of breath, weakness, dizziness, nausea, or vomiting. There are no other known modifying factors.       Past Medical History  Diagnosis Date  . Hypothyroid   . Corns and callosities   . Urge incontinence   . Screening for diabetes mellitus    Past Surgical History  Procedure Laterality Date  . Abdominal hysterectomy  1985    Dr Mancel Bale  . Tonsillectomy  1951  . Thyroidectomy  1987    Dr Guadlupe Spanish  . Carpel tunnel Bilateral 2010    Dr Laurance Flatten  . Right bunion      Dr Jomarie Longs  . Right rotator cuff repair      Dr Theda Sers  . Cesarean section  1983   Family History  Problem Relation Age of Onset  . Cancer Mother 54    Leukemia  . Cancer Father   . Diabetes Brother   . COPD Brother    History  Substance Use Topics  . Smoking status: Former Research scientist (life sciences)  . Smokeless tobacco: Former Systems developer    Quit date: 10/31/1978  . Alcohol Use: No   OB History   Grav Para Term Preterm Abortions TAB SAB Ect Mult  Living                 Review of Systems  All other systems reviewed and are negative.     Allergies  Review of patient's allergies indicates no known allergies.  Home Medications   Prior to Admission medications   Medication Sig Start Date End Date Taking? Authorizing Provider  acetaminophen (TYLENOL) 500 MG tablet Take 1,000 mg by mouth every 6 (six) hours as needed for mild pain.   Yes Historical Provider, MD  Ascorbic Acid (VITAMIN C PO) Take 1 tablet by mouth daily.   Yes Historical Provider, MD  aspirin 81 MG tablet Take 81 mg by mouth daily.   Yes Historical Provider, MD  CALCIUM-VITAMIN D PO Take 2 tablets by mouth daily.    Yes Historical Provider, MD  levothyroxine (SYNTHROID, LEVOTHROID) 50 MCG tablet Take 1 tablet (50 mcg total) by mouth daily. 04/30/14  Yes Mahima Bubba Camp, MD  Multiple Vitamin (MULTIVITAMIN WITH MINERALS) TABS Take 1 tablet by mouth daily.   Yes Historical Provider, MD  omeprazole (PRILOSEC) 20 MG capsule Take 1 capsule (20 mg total) by mouth daily. 04/30/14  Yes Mahima Pandey, MD  polyethylene glycol (MIRALAX / GLYCOLAX) packet Take 17 g by mouth daily as needed for mild constipation.  Yes Historical Provider, MD  rOPINIRole (REQUIP) 0.25 MG tablet Take 1 tablet (0.25 mg total) by mouth daily after supper. 1-2 hour before going to bed 09/10/13  Yes Mahima Pandey, MD   BP 157/61  Pulse 48  Temp(Src) 97.8 F (36.6 C) (Oral)  Resp 18  Ht 5\' 3"  (1.6 m)  Wt 178 lb (80.74 kg)  BMI 31.54 kg/m2  SpO2 100% Physical Exam  Nursing note and vitals reviewed. Constitutional: She is oriented to person, place, and time. She appears well-developed and well-nourished.  HENT:  Head: Normocephalic and atraumatic.  Eyes: Conjunctivae and EOM are normal. Pupils are equal, round, and reactive to light.  Neck: Normal range of motion and phonation normal. Neck supple.  Cardiovascular: Normal rate, regular rhythm and intact distal pulses.   Pulmonary/Chest: Effort normal  and breath sounds normal. She exhibits no tenderness.  Abdominal: Soft. She exhibits no distension. There is no tenderness. There is no guarding.  Musculoskeletal: Normal range of motion. She exhibits no edema and no tenderness.  Neurological: She is alert and oriented to person, place, and time. She exhibits normal muscle tone.  Skin: Skin is warm and dry.  Psychiatric: She has a normal mood and affect. Her behavior is normal. Judgment and thought content normal.    ED Course  Procedures (including critical care time)  Medications  gi cocktail (Maalox,Lidocaine,Donnatal) (30 mLs Oral Given 07/04/14 0949)    Patient Vitals for the past 24 hrs:  BP Temp Temp src Pulse Resp SpO2 Height Weight  07/04/14 0910 157/61 mmHg 97.8 F (36.6 C) Oral 48 18 100 % 5\' 3"  (1.6 m) 178 lb (80.74 kg)    10:33 AM Reevaluation with update and discussion. After initial assessment and treatment, an updated evaluation reveals chest discomfort improved after GI cocktail. She is hungry now. HR 40, with walking 50 feet, HR increased to 57, and pt. Noticed some light-headedness. Alec Jaros L   11:35- Discussed with Cardiology- they will evaluate pt. In the ED  Labs Review Labs Reviewed  CBC - Abnormal; Notable for the following:    WBC 3.7 (*)    All other components within normal limits  BASIC METABOLIC PANEL - Abnormal; Notable for the following:    GFR calc non Af Amer 58 (*)    GFR calc Af Amer 67 (*)    All other components within normal limits  Randolm Idol, ED    Imaging Review Dg Chest Port 1 View  07/04/2014   CLINICAL DATA:  Chest pain  EXAM: PORTABLE CHEST - 1 VIEW  COMPARISON:  None.  FINDINGS: The lungs are well-expanded and clear. The heart and mediastinal structures are normal. There is no pleural effusion, pneumothorax, or pneumomediastinum. There is calcification in the wall of the aortic arch and there is tortuosity of the descending thoracic aorta. There surgical clips at the base  of the neck. The bony structures are unremarkable.  IMPRESSION: There is no acute cardiopulmonary abnormality.   Electronically Signed   By: David  Martinique   On: 07/04/2014 09:37     EKG Interpretation   Date/Time:  Friday July 04 2014 09:03:46 EDT Ventricular Rate:  48 PR Interval:  156 QRS Duration: 88 QT Interval:  452 QTC Calculation: 403 R Axis:   -4 Text Interpretation:  Sinus bradycardia Otherwise normal ECG No old  tracing to compare Confirmed by [facility]  MD, Leesa Leifheit ([id number]) on 07/04/2014  9:11:48 AM      MDM   Final diagnoses:  Nonspecific chest  pain  Bradycardia    Atypical for cardiac-sourced chest pain, with bradycardia, not on rate limiting medications. She is symptomatic for dizziness, with ambulation. Doubt ACS, PE, or pneumonia. Chest discomfort is most likely related to esophageal reflux and/or gastritis.     Nursing Notes Reviewed/ Care Coordinated, and agree without changes. Applicable Imaging Reviewed.  Interpretation of Laboratory Data incorporated into ED treatment   Plan: Admit  Richarda Blade, MD 07/08/14 [phone]

## 2014-07-04 NOTE — Consult Note (Addendum)
Primary Physician: Primary Cardiologist:  New   HPI: Colleen [name] is a 68 yo who we are asked to see re bradycardia. And CP  The Colleen [name] has no known Hx of CAD.  Yesterday had chest discomfort.  Lasted 20 min.  Went away  Had seveal episodes  Some improve with belching.    Pain radiates to arms and back.  Different from reflux that she had been treated for in July (chest heaviness that radiated to R side of chest  Omeprazole helpoed that) The doscomfort continued toay.  Heavy  On anf off  Pressure in chest.  Took ASA  Camer to ER  Pain free now.  Rare lightheadness  Goes to store   No change in ability to to that  Overall is not that active Did go to [employer] about 1 wk agol.     Past Medical History  Diagnosis Date  . Hypothyroid   . Corns and callosities   . Urge incontinence   . Screening for diabetes mellitus      (Not in a hospital admission)     Infusions:   No Known Allergies  History   Social History  . Marital Status: Widowed    Spouse Name: N/A    Number of Children: N/A  . Years of Education: N/A   Occupational History  . Not on file.   Social History Main Topics  . Smoking status: Former Research scientist (life sciences)  . Smokeless tobacco: Former Systems developer    Quit date: 10/31/1978  . Alcohol Use: No  . Drug Use: No  . Sexual Activity: Not on file   Other Topics Concern  . Not on file   Social History Narrative  . No narrative on file    Family History  Problem Relation Age of Onset  . Cancer Mother 69    Leukemia  . Cancer Father   . Diabetes Brother   . COPD Brother     REVIEW OF SYSTEMS:  All systems reviewed  Negative to the above problem except as noted above.    PHYSICAL EXAM: Filed Vitals:   07/04/14 1211  BP: 134/48  Pulse:   Temp:   Resp: 19    No intake or output data in the 24 hours ending 07/04/14 1214  General:  Well appearing. No respiratory difficulty HEENT: normal Neck: supple. no JVD. Carotids 2+ bilat; no bruits. No  lymphadenopathy or thryomegaly appreciated. Cor: PMI nondisplaced. Regular rate & rhythm. No rubs, gallops or murmurs. Lungs: clear Abdomen: soft, nontender, nondistended. No hepatosplenomegaly. No bruits or masses. Good bowel sounds. Extremities: no cyanosis, clubbing, rash, edema Neuro: alert & oriented x 3, cranial nerves grossly intact. moves all 4 extremities w/o difficulty. Affect pleasant.  ECG:  SB 48 bpm    Results for orders placed during the hospital encounter of 07/04/14 (from the past 24 hour(s))  CBC     Status: Abnormal   Collection Time    07/04/14  9:20 AM      Result Value Ref Range   WBC 3.7 (*) 4.0 - 10.5 K/uL   RBC 4.02  3.87 - 5.11 MIL/uL   Hemoglobin 12.3  12.0 - 15.0 g/dL   HCT 37.4  36.0 - 46.0 %   MCV 93.0  78.0 - 100.0 fL   MCH 30.6  26.0 - 34.0 pg   MCHC 32.9  30.0 - 36.0 g/dL   RDW 13.3  11.5 - 15.5 %   Platelets 238  150 - 400 K/uL  BASIC METABOLIC PANEL     Status: Abnormal   Collection Time    07/04/14  9:20 AM      Result Value Ref Range   Sodium 144  137 - 147 mEq/L   Potassium 4.2  3.7 - 5.3 mEq/L   Chloride 106  96 - 112 mEq/L   CO2 25  19 - 32 mEq/L   Glucose, Bld 89  70 - 99 mg/dL   BUN 12  6 - 23 mg/dL   Creatinine, Ser 0.97  0.50 - 1.10 mg/dL   Calcium 9.3  8.4 - 10.5 mg/dL   GFR calc non Af Amer 58 (*) >90 mL/min   GFR calc Af Amer 67 (*) >90 mL/min   Anion gap 13  5 - 15  I-STAT TROPOININ, ED     Status: None   Collection Time    07/04/14  9:28 AM      Result Value Ref Range   Troponin i, poc 0.01  0.00 - 0.08 ng/mL   Comment 3            Dg Chest Port 1 View  07/04/2014   CLINICAL DATA:  Chest pain  EXAM: PORTABLE CHEST - 1 VIEW  COMPARISON:  None.  FINDINGS: The lungs are well-expanded and clear. The heart and mediastinal structures are normal. There is no pleural effusion, pneumothorax, or pneumomediastinum. There is calcification in the wall of the aortic arch and there is tortuosity of the descending thoracic aorta. There  surgical clips at the base of the neck. The bony structures are unremarkable.  IMPRESSION: There is no acute cardiopulmonary abnormality.   Electronically Signed   By: David  Martinique   On: 07/04/2014 09:37     ASSESSMENT:  Patient is a 68 yo who presents to ER for evaluation of chest pressure.  On and off for a couple days  Had different pressure in July for which she was treated with omeprazole  Helped  This is different. Exam signif for HR 48 (SB)  Bp 135/    ekg  SB  Impression  Chest pressure  R/O angina.  Treat for GI  Check D dimer with recent trip  If r/o reviewed eval with patient and daughter (hospitalist in [place])  Will proceed with stress myoview.   If r/i cath.    Lipds good in 2014  LDL 96.

## 2014-07-04 NOTE — ED Notes (Signed)
Pt here for chest pain, relieved with belching, sts after she drinks water and belches she feels better, reports pain goes through to back, pt denies pain at present.

## 2014-07-04 NOTE — ED Notes (Signed)
Pt ambulated for Heart rate and pulse. Dr Eulis Foster informed of results.

## 2014-07-05 ENCOUNTER — Observation Stay (HOSPITAL_COMMUNITY): Payer: Medicare Other

## 2014-07-05 ENCOUNTER — Other Ambulatory Visit: Payer: Self-pay | Admitting: Physician Assistant

## 2014-07-05 DIAGNOSIS — R079 Chest pain, unspecified: Secondary | ICD-10-CM

## 2014-07-05 DIAGNOSIS — R0789 Other chest pain: Secondary | ICD-10-CM | POA: Diagnosis not present

## 2014-07-05 DIAGNOSIS — I498 Other specified cardiac arrhythmias: Secondary | ICD-10-CM | POA: Diagnosis not present

## 2014-07-05 LAB — LIPID PANEL
Cholesterol: 198 mg/dL (ref 0–200)
HDL: 101 mg/dL (ref >39)
LDL Cholesterol: 88 mg/dL (ref 0–99)
Total CHOL/HDL Ratio: 2 RATIO
Triglycerides: 45 mg/dL (ref <150)
VLDL: 9 mg/dL (ref 0–40)

## 2014-07-05 LAB — BASIC METABOLIC PANEL
Anion gap: 10 (ref 5–15)
BUN: 11 mg/dL (ref 6–23)
CO2: 27 mEq/L (ref 19–32)
Calcium: 9.1 mg/dL (ref 8.4–10.5)
Chloride: 104 mEq/L (ref 96–112)
Creatinine, Ser: 0.92 mg/dL (ref 0.50–1.10)
GFR calc Af Amer: 71 mL/min — ABNORMAL LOW (ref >90)
GFR calc non Af Amer: 62 mL/min — ABNORMAL LOW (ref >90)
Glucose, Bld: 120 mg/dL — ABNORMAL HIGH (ref 70–99)
Potassium: 3.9 mEq/L (ref 3.7–5.3)
Sodium: 141 mEq/L (ref 137–147)

## 2014-07-05 LAB — CBC
HCT: 37.6 % (ref 36.0–46.0)
Hemoglobin: 12.3 g/dL (ref 12.0–15.0)
MCH: 30.5 pg (ref 26.0–34.0)
MCHC: 32.7 g/dL (ref 30.0–36.0)
MCV: 93.3 fL (ref 78.0–100.0)
Platelets: 215 10*3/uL (ref 150–400)
RBC: 4.03 MIL/uL (ref 3.87–5.11)
RDW: 13.2 % (ref 11.5–15.5)
WBC: 3 10*3/uL — ABNORMAL LOW (ref 4.0–10.5)

## 2014-07-05 LAB — TROPONIN I: Troponin I: <0.3 ng/mL (ref <0.30)

## 2014-07-05 MED ORDER — TECHNETIUM TC 99M SESTAMIBI - CARDIOLITE
30.0000 | Freq: Once | INTRAVENOUS | Status: AC | PRN
  Administered 2014-07-05: 30 via INTRAVENOUS

## 2014-07-05 MED ORDER — TECHNETIUM TC 99M SESTAMIBI GENERIC - CARDIOLITE
10.0000 | Freq: Once | INTRAVENOUS | Status: AC | PRN
Start: 2014-07-05 — End: 2014-07-05
  Administered 2014-07-05: 10 via INTRAVENOUS

## 2014-07-05 MED ORDER — REGADENOSON 0.4 MG/5ML IV SOLN
INTRAVENOUS | Status: DC
Start: 2014-07-05 — End: 2014-07-05
  Filled 2014-07-05: qty 5

## 2014-07-05 MED ORDER — NITROGLYCERIN 0.4 MG SL SUBL: 0.4000 mg | SUBLINGUAL_TABLET | SUBLINGUAL | Status: DC | PRN

## 2014-07-05 MED ORDER — PANTOPRAZOLE SODIUM 40 MG PO TBEC: 40.0000 mg | DELAYED_RELEASE_TABLET | Freq: Two times a day (BID) | ORAL | Status: DC

## 2014-07-05 NOTE — Progress Notes (Signed)
UR completed 

## 2014-07-05 NOTE — Progress Notes (Addendum)
Patient Name: Colleen [name] Date of Encounter: 07/05/2014     Active Problems:   Chest pain    SUBJECTIVE No further CP. Feels well.  Tolerated exercise nuclear stress test very well. Reached target HR. Walked 3 min 30 seconds. No acute ST or TW changes on ECG.  CURRENT MEDS . aspirin EC  81 mg Oral Daily  . heparin  5,000 Units Subcutaneous 3 times per day  . pantoprazole  40 mg Oral BID  . regadenoson      . sodium chloride  3 mL Intravenous Q12H    OBJECTIVE  Filed Vitals:   07/04/14 1431 07/04/14 2143 07/05/14 0523 07/05/14 0921  BP: 133/46 124/45 119/49 146/78  Pulse: 48 44 45 43  Temp: 98.2 F (36.8 C) 98.1 F (36.7 C) 98 F (36.7 C)   TempSrc: Oral Oral Oral   Resp: 16 16 18    Height: 5\' 2"  (1.575 m)     Weight: 178 lb (80.74 kg)  178 lb 2.1 oz (80.8 kg)   SpO2: 100% 98% 100%    No intake or output data in the 24 hours ending 07/05/14 1010 Filed Weights   07/04/14 0910 07/04/14 1431 07/05/14 0523  Weight: 178 lb (80.74 kg) 178 lb (80.74 kg) 178 lb 2.1 oz (80.8 kg)    PHYSICAL EXAM  General: Pleasant, NAD. Neuro: Alert and oriented X 3. Moves all extremities spontaneously. Psych: Normal affect. HEENT:  Normal  Neck: Supple without bruits or JVD. Lungs:  Resp regular and unlabored, CTA. Heart: RRR no s3, s4, or murmurs. Abdomen: Soft, non-tender, non-distended, BS + x 4.  Extremities: No clubbing, cyanosis or edema. DP/PT/Radials 2+ and equal bilaterally.  Accessory Clinical Findings  CBC  Recent Labs  07/04/14 0920  WBC 3.7*  HGB 12.3  HCT 37.4  MCV 93.0  PLT 481   Basic Metabolic Panel  Recent Labs  07/04/14 0920  NA 144  K 4.2  CL 106  CO2 25  GLUCOSE 89  BUN 12  CREATININE 0.97  CALCIUM 9.3   Cardiac Enzymes  Recent Labs  07/04/14 1623 07/04/14 2144 07/05/14 0330  TROPONINI <0.30 <0.30 <0.30   D-Dimer  Recent Labs  07/04/14 0920  DDIMER 0.51*   Thyroid Function Tests  Recent Labs  07/04/14 1700  TSH  2.190    TELE  Sinus brady HR 47  Radiology/Studies  Dg Chest Port 1 View  07/04/2014   CLINICAL DATA:  Chest pain  EXAM: PORTABLE CHEST - 1 VIEW  COMPARISON:  None.  FINDINGS: The lungs are well-expanded and clear. The heart and mediastinal structures are normal. There is no pleural effusion, pneumothorax, or pneumomediastinum. There is calcification in the wall of the aortic arch and there is tortuosity of the descending thoracic aorta. There surgical clips at the base of the neck. The bony structures are unremarkable.  IMPRESSION: There is no acute cardiopulmonary abnormality.   Electronically Signed   By: David  Martinique   On: 07/04/2014 09:37    ASSESSMENT AND PLAN  Colleen [name] is a 68 y.o. female with a history of obesity, GERD, hypothyroidism and no past cardiac hx who prestented to the [facility] ED yesteday with CP that radiated to arms and back and found to be bradycardic.   Chest pressure- Troponin neg x3.  -- No further sx since GI coctail. Continue protonix -- Tolerated exercise nuclear stress test very well. Reached target HR. No acute ST or TW changes on ECG. Will await images.  --  Will order lipid panel and HgA1c for further risk stratification  Bradycardia- HR usually runs in 60s per patient. No lightheadedness or dizziness.  -- TSH normal   Elevated D-Dimer- 0.51. with recent car trip. Not tachycardic or SOB. No LE pain or edema.  -- Consider LE dopplers  Obesity- Body mass index is 32.57 kg/(m^2). -- Encourgaged diet and exercise.  SignedEileen Stanford PA-C  Pager [phone]  As above, patient seen and examined. She has had no further chest pain. Enzymes are negative. Await followup nuclear results. If negative patient can be discharged and followup with primary care. Her d-dimer is minimally elevated. I recommended a CTA to exclude pulmonary embolus. However she and her daughter declined and understand the risk of undiagnosed pulmonary embolus. Telemetry was  reviewed. She is bradycardic at times with heart rates in the high 30s while asleep. There is no symptoms. No dizziness or history of syncope. Plan followup with Dr. Harrington Challenger after discharge. If nuclear study negative treat with Protonix 40 mg twice a day and DC omeprazole. Kirk Ruths   Addendum-nuclear study results reviewed. Ejection fraction 51% with mild inferior hypokinesis. Fixed inferior defect suggestive of scar versus artifact. Reviewed in detail with patient and daughter. Options of medical therapy versus cardiac catheterization reviewed. Given low risk I recommended medical therapy with aspirin and followup with Dr. Harrington Challenger. Would arrange outpatient echocardiogram to assess wall motion including inferior wall. Otherwise plan as outlined above. Kirk Ruths

## 2014-07-05 NOTE — Discharge Summary (Signed)
Discharge Summary   Patient ID: Colleen [name] MRN: [id number], DOB/AGE: [birth year] 68 y.o. Admit date: 07/04/2014 D/C date:     07/05/2014  Primary Cardiologist: Dr. Harrington Challenger (new)  Principal Problem:   Chest pain Active Problems:   Hypothyroidism   H/O total hysterectomy   Osteopenia    Discharge Diagnosis: chest pain s/p low risk exercise nuclear stress test with inferior hypokinesis. Follow up with ECHO as an outpatient recommended    HPI: Colleen [name] is a 68 y.o. female with a history of obesity, GERD, hypothyroidism and no past cardiac hx who prestented to the [facility] ED on 07/04/14 with CP that radiated to her arms and back and was found to be bradycardic.   Hospital Course  Chest pressure- Troponin neg x3.  -- No further sx since GI coctail.  -- Protonix 40 mg twice a day and omeprazole was discontinued.  -- Nuclear study results reviewed. Ejection fraction 51% with mild inferior hypokinesis. Fixed inferior defect suggestive of scar versus artifact. Reviewed in detail with patient and daughter. Options of medical therapy versus cardiac catheterization reviewed. Given low risk Dr. Stanford Breed recommended medical therapy with aspirin and followup with Dr. Harrington Challenger. Would arrange outpatient echocardiogram to assess wall motion including inferior wall.  Bradycardia- HR usually runs in 60s per patient. -- TSH normal  --Telemetry was reviewed. She is bradycardic at times with heart rates in the high 30s while asleep. There is no symptoms. No dizziness or history of syncope.   Elevated D-Dimer- 0.51 with recent car trip. Not tachycardic or SOB. No LE pain or edema.  -- CTA recommended to r/o PE but patient refused. She and her daughter declined and understand the risk of undiagnosed pulmonary embolus.  Obesity- Body mass index is 32.57 kg/(m^2).  -- Encourgaged diet and exercise.  HLD- well controlled on no medications. Lipid panel below.   The patient has had an uncomplicated hospital  course and is recovering well. She has been seen by Dr. Stanford Breed today and deemed ready for discharge home. All follow-up appointments have been scheduled ( message left for office to call as she was discharged on the weekend).  Discharge medications are listed below. Will add protonix 40mg  BID. Orders have been placed for a 2D ECHO as an outpatient.    Discharge Vitals: Blood pressure 131/63, pulse 44, temperature 97.6 F (36.4 C), temperature source Oral, resp. rate 17, height 5\' 2"  (1.575 m), weight 178 lb 2.1 oz (80.8 kg), SpO2 98.00%.  Labs: Lab Results  Component Value Date   WBC 3.0* 07/05/2014   HGB 12.3 07/05/2014   HCT 37.6 07/05/2014   MCV 93.3 07/05/2014   PLT 215 07/05/2014     Recent Labs Lab 07/05/14 1200  NA 141  K 3.9  CL 104  CO2 27  BUN 11  CREATININE 0.92  CALCIUM 9.1  GLUCOSE 120*    Recent Labs  07/04/14 1623 07/04/14 2144 07/05/14 0330  TROPONINI <0.30 <0.30 <0.30   Lab Results  Component Value Date   CHOL 198 07/05/2014   HDL 101 07/05/2014   LDLCALC 88 07/05/2014   TRIG 45 07/05/2014   Lab Results  Component Value Date   DDIMER 0.51* 07/04/2014    Diagnostic Studies/Procedures   Nm Myocar Multi W/spect W/wall Motion / Ef  07/05/2014   CLINICAL DATA:  Chest pain.  EXAM: MYOCARDIAL IMAGING WITH SPECT (REST AND EXERCISE)  GATED LEFT VENTRICULAR WALL MOTION STUDY  LEFT VENTRICULAR EJECTION FRACTION  TECHNIQUE: Standard myocardial SPECT  imaging was performed after resting intravenous injection of 10 mCi Tc-45m sestamibi. Subsequently, exercise tolerance test was performed by the patient under the supervision of the Cardiology staff. At peak-stress, 30 mCi Tc-48m sestamibi was injected intravenously and standard myocardial SPECT imaging was performed. Quantitative gated imaging was also performed to evaluate left ventricular wall motion, and estimate left ventricular ejection fraction.  COMPARISON:  None.  FINDINGS: Perfusion: There is some diminished perfusion of the  inferior wall on both rest and stress acquisitions suggesting component of scar. Mild attenuation of the mid to distal anterior wall may be attributable to breast soft tissue attenuation based on review of the raw planar imaging. No evidence of inducible ischemia with treadmill exercise. The patient achieved a maximal heart rate of 146 beats per min which is 97% of predicted maximum for age. No abnormal blood pressure or pulse response to exercise.  Wall Motion: Wall motion analysis shows mild inferior hypokinesis.  Left Ventricular Ejection Fraction: 51 %  End diastolic volume 88 ml  End systolic volume 43 ml  IMPRESSION: 1. No evidence of inducible myocardial ischemia with treadmill exercise. Inferior wall attenuation is suggestive of scar. Mild anterior wall attenuation is most likely attributable to breast soft tissue attenuation.  2. Mild inferior wall hypokinesis.  3. Left ventricular ejection fraction 51%  4. Low-risk stress test findings*.  *2012 Appropriate Use Criteria for Coronary Revascularization Focused Update: J Am Coll Cardiol. 3428;76(8):115-726. http://content.[email or web address].aspx?articleid=1201161   Electronically Signed   By: Aletta Edouard M.D.   On: 07/05/2014 12:07   Dg Chest Port 1 View  07/04/2014   CLINICAL DATA:  Chest pain  EXAM: PORTABLE CHEST - 1 VIEW  COMPARISON:  None.  FINDINGS: The lungs are well-expanded and clear. The heart and mediastinal structures are normal. There is no pleural effusion, pneumothorax, or pneumomediastinum. There is calcification in the wall of the aortic arch and there is tortuosity of the descending thoracic aorta. There surgical clips at the base of the neck. The bony structures are unremarkable.  IMPRESSION: There is no acute cardiopulmonary abnormality.   Electronically Signed   By: David  Martinique   On: 07/04/2014 09:37    Discharge Medications     Medication List    STOP taking these medications       omeprazole 20 MG capsule  Commonly  known as:  PRILOSEC      TAKE these medications       acetaminophen 500 MG tablet  Commonly known as:  TYLENOL  Take 1,000 mg by mouth every 6 (six) hours as needed for mild pain.     aspirin 81 MG tablet  Take 81 mg by mouth daily.     CALCIUM-VITAMIN D PO  Take 2 tablets by mouth daily.     levothyroxine 50 MCG tablet  Commonly known as:  SYNTHROID, LEVOTHROID  Take 1 tablet (50 mcg total) by mouth daily.     multivitamin with minerals Tabs tablet  Take 1 tablet by mouth daily.     nitroGLYCERIN 0.4 MG SL tablet  Commonly known as:  NITROSTAT  Place 1 tablet (0.4 mg total) under the tongue every 5 (five) minutes x 3 doses as needed for chest pain.     pantoprazole 40 MG tablet  Commonly known as:  PROTONIX  Take 1 tablet (40 mg total) by mouth 2 (two) times daily.     polyethylene glycol packet  Commonly known as:  MIRALAX / GLYCOLAX  Take 17 g by mouth daily as  needed for mild constipation.     rOPINIRole 0.25 MG tablet  Commonly known as:  REQUIP  Take 1 tablet (0.25 mg total) by mouth daily after supper. 1-2 hour before going to bed     VITAMIN C PO  Take 1 tablet by mouth daily.        Disposition   The patient will be discharged in stable condition to home.  Follow-up Information   Follow up with Dorris Carnes, MD. (The office will call to make an appointment wtih you in the next 1-2 weeks. Also you will need an ultrasound of your heart. If you do not hear from them by wednesday 07/09/14 please contact them  to make these appointments.)    Specialty:  Cardiology   Contact information:   [address] [place] [address] [phone]         Duration of Discharge Encounter: Greater than 30 minutes including physician and PA time.  Mable Fill R PA-C 07/05/2014, 3:32 PM

## 2014-07-06 LAB — HEMOGLOBIN A1C
Hgb A1c MFr Bld: 5.6 % (ref <5.7)
Mean Plasma Glucose: 114 mg/dL (ref <117)

## 2014-07-06 NOTE — Discharge Summary (Signed)
See progress notes Colleen [name]  

## 2014-07-08 ENCOUNTER — Ambulatory Visit (INDEPENDENT_AMBULATORY_CARE_PROVIDER_SITE_OTHER): Payer: Medicare Other | Admitting: Internal Medicine

## 2014-07-08 ENCOUNTER — Encounter: Payer: Self-pay | Admitting: Internal Medicine

## 2014-07-08 VITALS — BP 138/80 | HR 56 | Temp 97.6°F | Resp 10 | Ht 61.03 in | Wt 183.8 lb

## 2014-07-08 DIAGNOSIS — K5900 Constipation, unspecified: Secondary | ICD-10-CM | POA: Diagnosis not present

## 2014-07-08 DIAGNOSIS — G2581 Restless legs syndrome: Secondary | ICD-10-CM

## 2014-07-08 DIAGNOSIS — L84 Corns and callosities: Secondary | ICD-10-CM

## 2014-07-08 DIAGNOSIS — L814 Other melanin hyperpigmentation: Secondary | ICD-10-CM | POA: Insufficient documentation

## 2014-07-08 DIAGNOSIS — M899 Disorder of bone, unspecified: Secondary | ICD-10-CM

## 2014-07-08 DIAGNOSIS — E038 Other specified hypothyroidism: Secondary | ICD-10-CM

## 2014-07-08 DIAGNOSIS — R12 Heartburn: Secondary | ICD-10-CM | POA: Diagnosis not present

## 2014-07-08 DIAGNOSIS — M8580 Other specified disorders of bone density and structure, unspecified site: Secondary | ICD-10-CM

## 2014-07-08 DIAGNOSIS — H6122 Impacted cerumen, left ear: Secondary | ICD-10-CM

## 2014-07-08 DIAGNOSIS — Z23 Encounter for immunization: Secondary | ICD-10-CM

## 2014-07-08 DIAGNOSIS — L819 Disorder of pigmentation, unspecified: Secondary | ICD-10-CM

## 2014-07-08 DIAGNOSIS — M949 Disorder of cartilage, unspecified: Secondary | ICD-10-CM

## 2014-07-08 DIAGNOSIS — Z0000 Encounter for general adult medical examination without abnormal findings: Secondary | ICD-10-CM

## 2014-07-08 DIAGNOSIS — M19019 Primary osteoarthritis, unspecified shoulder: Secondary | ICD-10-CM

## 2014-07-08 DIAGNOSIS — H6120 Impacted cerumen, unspecified ear: Secondary | ICD-10-CM

## 2014-07-08 DIAGNOSIS — R6889 Other general symptoms and signs: Secondary | ICD-10-CM

## 2014-07-08 DIAGNOSIS — M19012 Primary osteoarthritis, left shoulder: Secondary | ICD-10-CM

## 2014-07-08 HISTORY — DX: Other melanin hyperpigmentation: L81.4

## 2014-07-08 HISTORY — DX: Restless legs syndrome: G25.81

## 2014-07-08 NOTE — Patient Instructions (Signed)
Use sun screen with spf 30 ON YOUR FACE AND NECK EVERYDAY  USE DEBROX EAR DROP IN LEFT EAR 1 DROP A DAY TO HELP WITH YOUR EAR WAX

## 2014-07-08 NOTE — Progress Notes (Signed)
Patient ID: Colleen [name], female   DOB: [birth year], 68 y.o.   MRN: [id number]       PCP: Blanchie Serve, MD  Code Status: full code  No Known Allergies  Chief Complaint: annual exam, Hospital follow up  HPI:  68 y/o female patient is here for her annual visit.  She was in the hospital from 07/04/14-07/05/14 with chest pain and bradycardia. Her serial troponin were negative and she had nuclear study showing EF of 51% with mild inferior hypokinesis. Medical management with aspirin and prn NTG provided and outpatient f/u with dr Harrington Challenger from cardiology with echocardiogram pending. Pulmonary embolism was ruled out. She was given Gi cocktail and her chest pain resolved. Denies any further chest pain. She does complain of burping and feels like she has indigestion. She complaints of heartburn.  She has history of GERD, hypothyroidism, osteopenia, RLS She has been having pain in left shoulder for several months. She has hx of right rotator cuff tear and had similar symptom before and feels this is similar. She had undergone surgery in right shoulder for rotator cuff tear in 2013 Mammogram- feb 2015 was normal  Colonoscopy 2013 - repeat in 5 years Pap smear - s/p hysterectomy dexa scan- never had one before  Immunization History  Administered Date(s) Administered  . Influenza-Unspecified 08/09/2012, 08/01/2013  . Pneumococcal Polysaccharide-23 02/19/2013  . Td 10/31/2000  . Varicella 06/15/2011  . Zoster 10/31/2010   Wt Readings from Last 3 Encounters:  07/08/14 183 lb 12.8 oz (83.371 kg)  07/05/14 178 lb 2.1 oz (80.8 kg)  04/30/14 177 lb (80.287 kg)    Review of Systems:  Constitutional: Negative for fever, chills, weight loss, malaise/fatigue and diaphoresis.  HENT: Negative for nasal congestion, hearing loss and sore throat.   Eyes: Negative for eye pain, blurred vision, double vision and discharge. wears glasses- uptodate with eye exam June 2015. Left eye glaucoma Respiratory: Negative  for cough, sputum production, shortness of breath and wheezing.  history of smoking in 27s.  Cardiovascular: Negative for chest pain, palpitations, orthopnea and leg swelling. uses 2 pillows at baseline Gastrointestinal: Negative for nausea, vomiting, abdominal pain, diarrhea. Has to take laxative once in a while for her bowel   Genitourinary: Negative for dysuria, urgency, frequency, hematuria, vaginal discharge and flank pain.  Musculoskeletal: Negative for back pain, falls, myalgias. knee joint and shoulder pain Skin: Negative for itching and rash.  Neurological: Negative for weakness,dizziness, tingling, focal weakness and headaches.  Psychiatric/Behavioral: Negative for depression and memory loss. The patient is not nervous/anxious.    Past Medical History  Diagnosis Date  . Hypothyroid   . Corns and callosities   . Urge incontinence   . Screening for diabetes mellitus    Past Surgical History  Procedure Laterality Date  . Abdominal hysterectomy  1985    Dr Mancel Bale  . Tonsillectomy  1951  . Thyroidectomy  1987    Dr Guadlupe Spanish  . Carpel tunnel Bilateral 2010    Dr Laurance Flatten  . Right bunion      Dr Jomarie Longs  . Right rotator cuff repair      Dr Theda Sers  . Cesarean section  1983   Social History:   reports that she has quit smoking. She quit smokeless tobacco use about 27 years ago. She reports that she does not drink alcohol or use illicit drugs.  Family History  Problem Relation Age of Onset  . Cancer Mother 60    Leukemia  . Cancer Father   .  Diabetes Brother   . COPD Brother     Medications: Patient's Medications  New Prescriptions   No medications on file  Previous Medications   ACETAMINOPHEN (TYLENOL) 500 MG TABLET    Take 1,000 mg by mouth every 6 (six) hours as needed for mild pain.   ASCORBIC ACID (VITAMIN C PO)    Take 1 tablet by mouth daily.   ASPIRIN 81 MG TABLET    Take 81 mg by mouth daily.   CALCIUM-VITAMIN D PO    Take 2 tablets by mouth daily.     LEVOTHYROXINE (SYNTHROID, LEVOTHROID) 50 MCG TABLET    Take 1 tablet (50 mcg total) by mouth daily.   MULTIPLE VITAMIN (MULTIVITAMIN WITH MINERALS) TABS    Take 1 tablet by mouth daily.   NITROGLYCERIN (NITROSTAT) 0.4 MG SL TABLET    Place 1 tablet (0.4 mg total) under the tongue every 5 (five) minutes x 3 doses as needed for chest pain.   POLYETHYLENE GLYCOL (MIRALAX / GLYCOLAX) PACKET    Take 17 g by mouth daily as needed for mild constipation.    ROPINIROLE (REQUIP) 0.25 MG TABLET    Take 1 tablet (0.25 mg total) by mouth daily after supper. 1-2 hour before going to bed  Modified Medications   No medications on file  Discontinued Medications   PANTOPRAZOLE (PROTONIX) 40 MG TABLET    Take 1 tablet (40 mg total) by mouth 2 (two) times daily.     Physical Exam: Filed Vitals:   07/08/14 0822  BP: 138/80  Pulse: 56  Temp: 97.6 F (36.4 C)  TempSrc: Oral  Resp: 10  Height: 5' 1.03" (1.55 m)  Weight: 183 lb 12.8 oz (83.371 kg)  SpO2: 91%    General- elderly female in no acute distress Head- atraumatic, normocephalic Eyes- PERRLA, EOMI, no pallor, no icterus, no discharge Neck- no lymphadenopathy, no thyromegaly, no jugular vein distension, no carotid bruit Ears- left ear normal tympanic membrane and normal external ear canal , right ear normal tympanic membrane and normal external ear canal Throat- moist mucus membrane, normal oropharynx Nose- normal nasal mucosa, no maxillary or frontal sinus tenderness Chest- no chest wall deformities, no chest wall tenderness Breast- no masses, no palpable lumps, normal nipple and areola exam, no axillary lymphadenopathy Cardiovascular- normal s1,s2, no murmurs/ rubs/ gallops Respiratory- bilateral clear to auscultation, no wheeze, no rhonchi, no crackles, no use of accessory muscles Abdomen- bowel sounds present, soft, non tender, no organomegaly, no abdominal bruits, no guarding or rigidity, no CVA tenderness Pelvic exam- NOT  DONE Musculoskeletal- able to move all 4 extremities, no spinal and paraspinal tenderness, steady gait, no use of assistive device, normal range of motion, no leg edema Neurological- no focal deficit, normal reflexes, normal muscle strength, normal sensation to fine touch and vibration Skin- warm and dry Psychiatry- alert and oriented to person, place and time, normal mood and affect    Labs reviewed: Basic Metabolic Panel:  Recent Labs  02/20/14 0811 07/04/14 0920 07/05/14 1200  NA 145* 144 141  K 4.3 4.2 3.9  CL 105 106 104  CO2 27 25 27   GLUCOSE 92 89 120*  BUN 10 12 11   CREATININE 1.13* 0.97 0.92  CALCIUM 9.6 9.3 9.1   CBC:  Recent Labs  08/20/13 1007 07/04/14 0920 07/05/14 1200  WBC 3.5 3.7* 3.0*  NEUTROABS 1.3*  --   --   HGB 12.7 12.3 12.3  HCT 39.9 37.4 37.6  MCV 94 93.0 93.3  PLT  --  238 215   Cardiac Enzymes:  Recent Labs  07/04/14 1623 07/04/14 2144 07/05/14 0330  TROPONINI <0.30 <0.30 <0.30   Lipid Panel     Component Value Date/Time   CHOL 198 07/05/2014 1200   TRIG 45 07/05/2014 1200   HDL 101 07/05/2014 1200   HDL 74 02/13/2013 1048   CHOLHDL 2.0 07/05/2014 1200   CHOLHDL 2.4 02/13/2013 1048   VLDL 9 07/05/2014 1200   LDLCALC 88 07/05/2014 1200   LDLCALC 97 02/13/2013 1048   Lab Results  Component Value Date   TSH 2.190 07/04/2014    Radiological Exams: Nm Myocar Multi W/spect W/wall Motion / Ef  07/05/2014   CLINICAL DATA:  Chest pain.  EXAM: MYOCARDIAL IMAGING WITH SPECT (REST AND EXERCISE)  GATED LEFT VENTRICULAR WALL MOTION STUDY  LEFT VENTRICULAR EJECTION FRACTION  TECHNIQUE: Standard myocardial SPECT imaging was performed after resting intravenous injection of 10 mCi Tc-50m sestamibi. Subsequently, exercise tolerance test was performed by the patient under the supervision of the Cardiology staff. At peak-stress, 30 mCi Tc-63m sestamibi was injected intravenously and standard myocardial SPECT imaging was performed. Quantitative gated imaging was  also performed to evaluate left ventricular wall motion, and estimate left ventricular ejection fraction.  COMPARISON:  None.  FINDINGS: Perfusion: There is some diminished perfusion of the inferior wall on both rest and stress acquisitions suggesting component of scar. Mild attenuation of the mid to distal anterior wall may be attributable to breast soft tissue attenuation based on review of the raw planar imaging. No evidence of inducible ischemia with treadmill exercise. The patient achieved a maximal heart rate of 146 beats per min which is 97% of predicted maximum for age. No abnormal blood pressure or pulse response to exercise.  Wall Motion: Wall motion analysis shows mild inferior hypokinesis.  Left Ventricular Ejection Fraction: 51 %  End diastolic volume 88 ml  End systolic volume 43 ml  IMPRESSION: 1. No evidence of inducible myocardial ischemia with treadmill exercise. Inferior wall attenuation is suggestive of scar. Mild anterior wall attenuation is most likely attributable to breast soft tissue attenuation.  2. Mild inferior wall hypokinesis.  3. Left ventricular ejection fraction 51%  4. Low-risk stress test findings*.  *2012 Appropriate Use Criteria for Coronary Revascularization Focused Update: J Am Coll Cardiol. 0350;09(3):818-299. http://content.[email or web address].aspx?articleid=1201161   Electronically Signed   By: Aletta Edouard M.D.   On: 07/05/2014 12:07   Dg Chest Port 1 View  07/04/2014   CLINICAL DATA:  Chest pain  EXAM: PORTABLE CHEST - 1 VIEW  COMPARISON:  None.  FINDINGS: The lungs are well-expanded and clear. The heart and mediastinal structures are normal. There is no pleural effusion, pneumothorax, or pneumomediastinum. There is calcification in the wall of the aortic arch and there is tortuosity of the descending thoracic aorta. There surgical clips at the base of the neck. The bony structures are unremarkable.  IMPRESSION: There is no acute cardiopulmonary abnormality.    Electronically Signed   By: David  Martinique   On: 07/04/2014 09:37      Assessment/Plan  1. Heartburn This likely contributed to some of her chest discomfort requiring ED visit. Will rule out h.pylori infection. Hold off on PPI until the test result - H. pylori Antibody, IgG  2. Other specified hypothyroidism Continue levothyroxine 50 mcg daily  3. Unspecified constipation Stable, continue miralax  4. Osteopenia Will get dexa scan to assess further. Continue with ca-vit d supplement  5. Callus of heel Improved, foot care to be continued, normal  foot exam today  6. Routine general medical examination at a health care facility the patient was counseled regarding the appropriate use of alcohol, regular self-examination of the breasts on a monthly basis, prevention of dental and periodontal disease, diet, regular sustained exercise for at least 30 minutes 5 times per week, routine screening interval for mammogram as recommended by the [place] and ACOG, the proper use of sunscreen and protective clothing, tobacco use, and recommended schedule for GI hemoccult testing, colonoscopy, cholesterol, thyroid and diabetes screening.  7. RLS (restless legs syndrome) Continue requip  8. Skin spots-aging Advised to use sunscreen to prevent and reduce the spots  9. Hypokinesis Pending cardiology appt - Ambulatory referral to Cardiology  10. Cerumen impaction, left Debrox ear drop advised  11. Need for prophylactic vaccination and inoculation against influenza Flu vaccine provided  12. Left shoulder pain Prn tylenol for now and reassess if no improvement    Blanchie Serve, MD  [facility] [phone] (Monday-Friday 8 am - 5 pm) [phone] (afterhours)

## 2014-07-08 NOTE — Progress Notes (Signed)
Passed clock drawing 

## 2014-07-09 LAB — H. PYLORI ANTIBODY, IGG: H Pylori IgG: >8 U/mL — ABNORMAL HIGH (ref 0.0–0.8)

## 2014-07-15 ENCOUNTER — Encounter: Payer: Self-pay | Admitting: Internal Medicine

## 2014-07-15 ENCOUNTER — Ambulatory Visit (INDEPENDENT_AMBULATORY_CARE_PROVIDER_SITE_OTHER): Payer: Medicare Other | Admitting: Internal Medicine

## 2014-07-15 ENCOUNTER — Telehealth: Payer: Self-pay | Admitting: *Deleted

## 2014-07-15 VITALS — BP 134/80 | HR 56 | Temp 97.5°F | Ht 61.03 in | Wt 182.0 lb

## 2014-07-15 DIAGNOSIS — A048 Other specified bacterial intestinal infections: Secondary | ICD-10-CM

## 2014-07-15 MED ORDER — PANTOPRAZOLE SODIUM 40 MG PO TBEC: 40.0000 mg | DELAYED_RELEASE_TABLET | Freq: Two times a day (BID) | ORAL | Status: DC

## 2014-07-15 MED ORDER — CLARITHROMYCIN 500 MG PO TABS: 500.0000 mg | ORAL_TABLET | Freq: Two times a day (BID) | ORAL | Status: DC

## 2014-07-15 MED ORDER — AMOXICILLIN 500 MG PO TABS: 1000.0000 mg | ORAL_TABLET | Freq: Two times a day (BID) | ORAL | Status: DC

## 2014-07-15 NOTE — Progress Notes (Signed)
Patient ID: Colleen [name], female   DOB: [birth year], 68 y.o.   MRN: [id number]    Chief Complaint  Patient presents with  . Follow-up    Discuss labs (copy printed)    No Known Allergies  HPI 68 y/o female patient is here for abnormal lab follow up. She has been having heartburn and indigestion symptoms for few weeks. She had h.pylori IgG tested and it has come positive  Denies any chest pain Denies hematemesis and vomiting occassional nausea Denies epigastric pain No weight loss noted Denies melena or rectal bleed Denies dysphagia and odynophagia  Wt Readings from Last 3 Encounters:  07/15/14 182 lb (82.555 kg)  07/08/14 183 lb 12.8 oz (83.371 kg)  07/05/14 178 lb 2.1 oz (80.8 kg)   Past Medical History  Diagnosis Date  . Hypothyroid   . Corns and callosities   . Urge incontinence   . Screening for diabetes mellitus    Medication reviewed. See [facility]  Physical exam BP 134/80  Pulse 56  Temp(Src) 97.5 F (36.4 C) (Oral)  Ht 5' 1.03" (1.55 m)  Wt 182 lb (82.555 kg)  BMI 34.36 kg/m2  SpO2 96%  General- elderly female in no acute distress Mouth- normal mucus membrane, no oral thrush, normal oropharynx Cardiovascular- normal s1,s2, no murmurs Respiratory- bilateral clear to auscultation, no wheeze, no rhonchi, no crackles Abdomen- bowel sounds present, soft, non tender, no guarding or rigidity, no CVA tenderness Psychiatry- alert and oriented to person, place and time, normal mood and affect  Labs- 07/09/14 h.pylori IgG positive > 8  Assessment/plan  1. Helicobacter pylori (H. pylori) infection Patient is positive for h.pylori and her dyspepsia is likely from this. Will treat her with triple therapy of amoxicillin 1 g bid, clarithromycin 500 mg bid and pantoprazole 40 mg bid for 2 weeks. Reassess her in 4 weeks. Advised to continue PPI after completion of antibiotics. If symptoms persists after this treatment, will consider gi referral for EGD to assess for ulcer vs  mass effect

## 2014-07-15 NOTE — Telephone Encounter (Signed)
Pharmacist with [facility] called and stated that the Protonix is not covered by patient's insurance but Prilosec 20mg  twice daily is covered.  [place] # [phone]  Per Dr. Bubba Camp ok to do Omeprazole 20mg  twice daily Pharmacist with [facility] Notified.

## 2014-07-23 ENCOUNTER — Ambulatory Visit (HOSPITAL_COMMUNITY): Payer: Medicare Other | Attending: Physician Assistant

## 2014-07-23 DIAGNOSIS — R072 Precordial pain: Secondary | ICD-10-CM | POA: Insufficient documentation

## 2014-07-23 DIAGNOSIS — R079 Chest pain, unspecified: Secondary | ICD-10-CM

## 2014-07-23 DIAGNOSIS — Z87891 Personal history of nicotine dependence: Secondary | ICD-10-CM | POA: Diagnosis not present

## 2014-07-23 NOTE — Progress Notes (Signed)
2D Echo completed. 07/23/2014

## 2014-07-30 NOTE — Progress Notes (Signed)
HPI Patient is a 68 yo who I saw at [facility] earlier this month  Admitted for CP Hx GERD.  R/O for MI  Nuclear scan showed inf scar vs artifact  No ischemia  LVEF 51% with mild inferior hypokinesis After d/c the patient had an echo done that showed LVEF normal  MOd TR. The patient wa sseen by her primary  H Pylori increased  Just finished ABX  Still with Chest pressure.  Not associated with activity  Some good, some bad days  May be a little better since finishing ABX No Known Allergies  Current Outpatient Prescriptions  Medication Sig Dispense Refill  . acetaminophen (TYLENOL) 500 MG tablet Take 1,000 mg by mouth every 6 (six) hours as needed for mild pain.      Marland Kitchen amoxicillin (AMOXIL) 500 MG tablet Take 2 tablets (1,000 mg total) by mouth 2 (two) times daily.  56 tablet  0  . Ascorbic Acid (VITAMIN C PO) Take 1 tablet by mouth daily.      Marland Kitchen aspirin 81 MG tablet Take 81 mg by mouth daily.      Marland Kitchen CALCIUM-VITAMIN D PO Take 2 tablets by mouth daily.       . clarithromycin (BIAXIN) 500 MG tablet Take 1 tablet (500 mg total) by mouth 2 (two) times daily.  28 tablet  0  . levothyroxine (SYNTHROID, LEVOTHROID) 50 MCG tablet Take 1 tablet (50 mcg total) by mouth daily.  90 tablet  1  . Multiple Vitamin (MULTIVITAMIN WITH MINERALS) TABS Take 1 tablet by mouth daily.      . nitroGLYCERIN (NITROSTAT) 0.4 MG SL tablet Place 1 tablet (0.4 mg total) under the tongue every 5 (five) minutes x 3 doses as needed for chest pain.  25 tablet  12  . pantoprazole (PROTONIX) 40 MG tablet Take 1 tablet (40 mg total) by mouth 2 (two) times daily.  60 tablet  1  . polyethylene glycol (MIRALAX / GLYCOLAX) packet Take 17 g by mouth daily as needed for mild constipation.       Marland Kitchen rOPINIRole (REQUIP) 0.25 MG tablet Take 1 tablet (0.25 mg total) by mouth daily after supper. 1-2 hour before going to bed  90 tablet  3   No current facility-administered medications for this visit.    Past Medical History  Diagnosis  Date  . Hypothyroid   . Corns and callosities   . Urge incontinence   . Screening for diabetes mellitus     Past Surgical History  Procedure Laterality Date  . Abdominal hysterectomy  1985    Dr Mancel Bale  . Tonsillectomy  1951  . Thyroidectomy  1987    Dr Guadlupe Spanish  . Carpel tunnel Bilateral 2010    Dr Laurance Flatten  . Right bunion      Dr Jomarie Longs  . Right rotator cuff repair      Dr Theda Sers  . Cesarean section  1983    Family History  Problem Relation Age of Onset  . Cancer Mother 30    Leukemia  . Cancer Father   . Diabetes Brother   . COPD Brother     History   Social History  . Marital Status: Widowed    Spouse Name: N/A    Number of Children: N/A  . Years of Education: N/A   Occupational History  . Not on file.   Social History Main Topics  . Smoking status: Former Research scientist (life sciences)  . Smokeless tobacco: Former Leisure centre manager  date: 10/31/1986  . Alcohol Use: No  . Drug Use: No  . Sexual Activity: Not on file   Other Topics Concern  . Not on file   Social History Narrative  . No narrative on file    Review of Systems:  All systems reviewed.  They are negative to the above problem except as previously stated.  Vital Signs: BP 136/60  Pulse 54  Ht 5\' 1"  (1.549 m)  Wt 183 lb (83.008 kg)  BMI 34.60 kg/m2  SpO2 96%  Physical Exam Patient is in NAD  HEENT:  Normocephalic, atraumatic. EOMI, PERRLA.  Neck: JVP is normal.  No bruits.  Lungs: clear to auscultation. No rales no wheezes.  Heart: Regular rate and rhythm. Normal S1, S2. No S3.   No significant murmurs. PMI not displaced.  Abdomen:  Supple, nontender. Normal bowel sounds. No masses. No hepatomegaly.  Extremities:   Good distal pulses throughout. No lower extremity edema.  Musculoskeletal :moving all extremities.  Neuro:   alert and oriented x3.  CN II-XII grossly intact.   Assessment and Plan:  1.  CP  I think it is most likely GI in origin.  Would continue protonic  F?u Dr Bubba Camp  WIll start initiating  work for GI eval    Will set f/u for next summer

## 2014-07-31 ENCOUNTER — Encounter: Payer: Self-pay | Admitting: Internal Medicine

## 2014-07-31 ENCOUNTER — Ambulatory Visit (INDEPENDENT_AMBULATORY_CARE_PROVIDER_SITE_OTHER): Payer: Medicare Other | Admitting: Internal Medicine

## 2014-07-31 VITALS — BP 136/60 | HR 54 | Ht 61.0 in | Wt 183.0 lb

## 2014-07-31 DIAGNOSIS — Z8619 Personal history of other infectious and parasitic diseases: Secondary | ICD-10-CM | POA: Diagnosis not present

## 2014-07-31 NOTE — Patient Instructions (Addendum)
Your physician recommends that you continue on your current medications as directed. Please refer to the Current Medication list given to you today. Your physician wants you to follow-up in: 9 months with Dr. Harrington Challenger.   You will receive a reminder letter in the mail two months in advance. If you don't receive a letter, please call our office to schedule the follow-up appointment. You have been referred to [facility], Dr. Lucio Edward.  You will be receiving a phone call from their office to set up an appointment.

## 2014-08-04 ENCOUNTER — Telehealth: Payer: Self-pay | Admitting: Internal Medicine

## 2014-08-04 NOTE — Telephone Encounter (Signed)
Pt called me back. Informed of below. Pt verbalizes understanding. She will wait to hear from GI office.

## 2014-08-04 NOTE — Telephone Encounter (Signed)
New message    Patient calling stating someone called them today.  Returning call back to nurse.

## 2014-08-04 NOTE — Telephone Encounter (Signed)
Could not see where anyone called patient today. Patient was contacted last week by [facility] to inform that someone from GI will be calling to schedule appointment per referral. Tried to call pt back to inform. No answer.  lmtcb

## 2014-08-06 ENCOUNTER — Encounter: Payer: Self-pay | Admitting: Gastroenterology

## 2014-08-06 NOTE — Telephone Encounter (Signed)
Error

## 2014-08-12 ENCOUNTER — Ambulatory Visit (INDEPENDENT_AMBULATORY_CARE_PROVIDER_SITE_OTHER): Payer: Medicare Other | Admitting: Internal Medicine

## 2014-08-12 ENCOUNTER — Encounter: Payer: Self-pay | Admitting: Internal Medicine

## 2014-08-12 VITALS — BP 140/76 | HR 70 | Temp 98.4°F | Resp 10 | Wt 182.0 lb

## 2014-08-12 DIAGNOSIS — M5442 Lumbago with sciatica, left side: Secondary | ICD-10-CM | POA: Insufficient documentation

## 2014-08-12 DIAGNOSIS — K2970 Gastritis, unspecified, without bleeding: Secondary | ICD-10-CM

## 2014-08-12 DIAGNOSIS — B9681 Helicobacter pylori [H. pylori] as the cause of diseases classified elsewhere: Secondary | ICD-10-CM

## 2014-08-12 HISTORY — DX: Lumbago with sciatica, left side: M54.42

## 2014-08-12 HISTORY — DX: Helicobacter pylori (H. pylori) as the cause of diseases classified elsewhere: B96.81

## 2014-08-12 HISTORY — DX: Gastritis, unspecified, without bleeding: K29.70

## 2014-08-12 NOTE — Progress Notes (Signed)
Patient ID: Colleen [name], female   DOB: [birth year], 68 y.o.   MRN: [id number]    Chief Complaint  Patient presents with  . Medical Management of Chronic Issues    4 week follow-up on H.Pylori and heartburn   . Back Pain    Lower back pain   . Light Headed    Off/on x 3 weeks patient c/o light headed feeling, lst episode was yesterday, prior was 1 week ago   No Known Allergies  HPI 68 y/o female patient is here for follow up on her heartburn. She was started on treatment for h.pylori 2 weeks back.. She as completed the triple therapy but her symptoms of heartburn and epigastric discomfort persists.  Denies any nausea or vomiting Denies any altered bowel movement Has been having low back pain for 2 weeks in left lower back area with radiation intermittently to her left leg. Denies any numbness or tingling. Denies fall/ trauma. Does not remember lifting heavy objects  ROS No focal weakness She has taken tylenol for her pain and noticed it to help her but she has taken it 2 times over last 2 weeks No blood in stool No melena  Past Medical History  Diagnosis Date  . Hypothyroid   . Corns and callosities   . Urge incontinence   . Screening for diabetes mellitus    Current Outpatient Prescriptions on File Prior to Visit  Medication Sig Dispense Refill  . acetaminophen (TYLENOL) 500 MG tablet Take 1,000 mg by mouth every 6 (six) hours as needed for mild pain.      . Ascorbic Acid (VITAMIN C PO) Take 1 tablet by mouth daily.      Marland Kitchen aspirin 81 MG tablet Take 81 mg by mouth daily.      Marland Kitchen CALCIUM-VITAMIN D PO Take 2 tablets by mouth daily.       Marland Kitchen levothyroxine (SYNTHROID, LEVOTHROID) 50 MCG tablet Take 1 tablet (50 mcg total) by mouth daily.  90 tablet  1  . Multiple Vitamin (MULTIVITAMIN WITH MINERALS) TABS Take 1 tablet by mouth daily.      . nitroGLYCERIN (NITROSTAT) 0.4 MG SL tablet Place 1 tablet (0.4 mg total) under the tongue every 5 (five) minutes x 3 doses as needed for chest  pain.  25 tablet  12  . pantoprazole (PROTONIX) 40 MG tablet Take 1 tablet (40 mg total) by mouth 2 (two) times daily.  60 tablet  1  . polyethylene glycol (MIRALAX / GLYCOLAX) packet Take 17 g by mouth daily as needed for mild constipation.       Marland Kitchen rOPINIRole (REQUIP) 0.25 MG tablet Take 1 tablet (0.25 mg total) by mouth daily after supper. 1-2 hour before going to bed  90 tablet  3   No current facility-administered medications on file prior to visit.   Physical exam BP 140/76  Pulse 70  Temp(Src) 98.4 F (36.9 C) (Oral)  Resp 10  Wt 182 lb (82.555 kg)  SpO2 95%  General- elderly female in no acute distress Head- atraumatic, normocephalic Neck- no lymphadenopathy Cardiovascular- normal s1,s2, no murmurs/ rubs/ gallops, normal distal pulses Respiratory- bilateral clear to auscultation, no wheeze, no rhonchi, no crackles Abdomen- bowel sounds present, soft, non tender, no epigastric tenderness, no guarding or rigidity Musculoskeletal- able to move all 4 extremities, left paraspinal tenderness in lumbar area, no focal weakness  Neurological- no focal deficit Skin- warm and dry, no rash or lesions noted Psychiatry- alert and oriented to person, place and time,  normal mood and affect    Assessment/plan  1. Left-sided low back pain with left-sided sciatica Likely has lumbar radiculopathy from DJD. Normal neurological exam. Start her on tylenol 1000 mg bid for a week, if no improvement get xray of lumbar spine. Warning signs with spinal stenosis explained  2. Helicobacter pylori gastritis Completed antibiotics with not much improvement, has been taking her PPI. Will have her see GI tomorrow for further evaluation, patient might benefit from EGD to help assess for ulcer/ gastritis

## 2014-08-13 ENCOUNTER — Ambulatory Visit (INDEPENDENT_AMBULATORY_CARE_PROVIDER_SITE_OTHER): Payer: Medicare Other | Admitting: Gastroenterology

## 2014-08-13 ENCOUNTER — Encounter: Payer: Self-pay | Admitting: Gastroenterology

## 2014-08-13 ENCOUNTER — Telehealth: Payer: Self-pay | Admitting: *Deleted

## 2014-08-13 VITALS — BP 116/60 | HR 52 | Ht 61.0 in | Wt 182.0 lb

## 2014-08-13 DIAGNOSIS — R0789 Other chest pain: Secondary | ICD-10-CM | POA: Diagnosis not present

## 2014-08-13 DIAGNOSIS — A048 Other specified bacterial intestinal infections: Secondary | ICD-10-CM

## 2014-08-13 DIAGNOSIS — B9681 Helicobacter pylori [H. pylori] as the cause of diseases classified elsewhere: Secondary | ICD-10-CM

## 2014-08-13 DIAGNOSIS — R1013 Epigastric pain: Secondary | ICD-10-CM | POA: Diagnosis not present

## 2014-08-13 NOTE — Progress Notes (Signed)
Agree with initial assessment and plans as outlined 

## 2014-08-13 NOTE — Patient Instructions (Signed)
Please go to the basement level to have our lab for a stool study. You have been scheduled for an endoscopy. Please follow written instructions given to you at your visit today. If you use inhalers (even only as needed), please bring them with you on the day of your procedure. Your physician has requested that you go to www.startemmi.com and enter the access code given to you at your visit today. This web site gives a general overview about your procedure. However, you should still follow specific instructions given to you by our office regarding your preparation for the procedure.

## 2014-08-13 NOTE — Progress Notes (Addendum)
08/13/2014 Bernadette Hoit [id number] 1946/03/22   HISTORY OF PRESENT ILLNESS:  This is a pleasant 68 year old female who is new to our practice and referred here by her PCP, Dr. Bubba Camp, for complaints of burning in her chest and epigastrium.  Was tested for Hpylori and IgG was positive so she was treated for 2 weeks with Prepak (amoxicillin, clarithromycin, and BID PPI).  She says that she completed the medication but still continues to have this burning sensation just not as severe as previously.  It started several months ago.  Sometimes worse than others.  Is on pantoprazole 40 mg BID now since being treated for the Hpylori, but was on once a day previously.  Denies any dysphagia or weight loss.  Complains of a lot of belching.  No nausea or vomiting.  Denies NSAID use.  She's never had EGD in the past.  Recent CBC, BMP, and TSH ok.  Had colonoscopy in [place] in 01/2012, which we are going to try to obtain the records.  She moved to [place] from [place] in 2013 after she retired so that she could live near her daughter.   Past Medical History  Diagnosis Date  . Hypothyroidism   . Corns and callosities   . Urge incontinence   . Screening for diabetes mellitus   . Arthritis   . Obesity    Past Surgical History  Procedure Laterality Date  . Abdominal hysterectomy  1985    Dr Mancel Bale  . Tonsillectomy  1951  . Thyroidectomy  1987    Dr Guadlupe Spanish  . Carpal tunnel release Bilateral 2010    Dr Laurance Flatten  . Bunionectomy Right     Dr Jomarie Longs  . Rotator cuff repair Right     Dr Theda Sers  . Cesarean section  1983    reports that she quit smoking about 27 years ago. Her smoking use included Cigarettes. She smoked 0.00 packs per day. She has never used smokeless tobacco. She reports that she does not drink alcohol or use illicit drugs. family history includes COPD in her brother; Cancer in her maternal grandfather; Diabetes in her brother and maternal grandmother; Leukemia (age of  onset: 29) in her mother; Lung cancer in her brother and father. No Known Allergies    Outpatient Encounter Prescriptions as of 08/13/2014  Medication Sig  . acetaminophen (TYLENOL) 500 MG tablet Take 1,000 mg by mouth every 6 (six) hours as needed for mild pain.  . Ascorbic Acid (VITAMIN C PO) Take 1 tablet by mouth daily.  Marland Kitchen aspirin 81 MG tablet Take 81 mg by mouth daily.  Marland Kitchen CALCIUM-VITAMIN D PO Take 2 tablets by mouth daily.   Marland Kitchen levothyroxine (SYNTHROID, LEVOTHROID) 50 MCG tablet Take 1 tablet (50 mcg total) by mouth daily.  . Multiple Vitamin (MULTIVITAMIN WITH MINERALS) TABS Take 1 tablet by mouth daily.  . nitroGLYCERIN (NITROSTAT) 0.4 MG SL tablet Place 1 tablet (0.4 mg total) under the tongue every 5 (five) minutes x 3 doses as needed for chest pain.  . pantoprazole (PROTONIX) 40 MG tablet Take 1 tablet (40 mg total) by mouth 2 (two) times daily.  . polyethylene glycol (MIRALAX / GLYCOLAX) packet Take 17 g by mouth daily as needed for mild constipation.   Marland Kitchen rOPINIRole (REQUIP) 0.25 MG tablet Take 1 tablet (0.25 mg total) by mouth daily after supper. 1-2 hour before going to bed     REVIEW OF SYSTEMS  : All other systems reviewed and negative except where  noted in the History of Present Illness.   PHYSICAL EXAM: BP 116/60  Pulse 52  Ht 5\' 1"  (1.549 m)  Wt 182 lb (82.555 kg)  BMI 34.41 kg/m2 General: Well developed black female in no acute distress Head: Normocephalic and atraumatic Eyes:  Sclerae anicteric, conjunctiva pink. Ears: Normal auditory acuity Lungs: Clear throughout to auscultation Heart:  Bradycardic.  No murmurs noted. Abdomen: Soft, non-distended.  Normal bowel sounds.  Mild epigastric TTP without R/R/G. Musculoskeletal: Symmetrical with no gross deformities  Skin: No lesions on visible extremities Extremities: No edema  Neurological: Alert oriented x 4, grossly non-focal Psychological:  Alert and cooperative. Normal mood and affect  ASSESSMENT AND  PLAN: -Atypical chest pain and epigastric abdominal pain:  Described as burning.  Is current on pantoprazole 40 mg BID and still symptomatic. -Positive Hpylori IgG:  Was treated with 2 week course of Prevpak.  *Will schedule EGD with Dr. Henrene Pastor to rule out GERD/reflux esophagitis, ongoing Hpylori infection, etc.  The risks, benefits, and alternatives were discussed with the patient and she consents to proceed.  Will check stool for Hpylori.  Continue BID PPI for now.  **We received her colonoscopy records from [facility]. Her last colonoscopy was April 2013 at which time she had moderate diverticulosis of the descending and sigmoid colon. She had a 3 mm polyp removed from the cecum that was a tubular adenoma. She also had a 5 mm polyp removed from the ascending colon that showed colonic mucosa with prominent lymphoid aggregate negative for adenomatous epithelium on biopsy. The third polyp was removed from the transverse colon was a sessile serrated polyp with no dysplasia.

## 2014-08-13 NOTE — Telephone Encounter (Signed)
Received fax paperwork from [facility] a Prescriber Response Form for Omeprazole. Given to Dr. Bubba Camp to review.

## 2014-08-14 ENCOUNTER — Other Ambulatory Visit: Payer: Medicare Other

## 2014-08-14 DIAGNOSIS — R1013 Epigastric pain: Secondary | ICD-10-CM

## 2014-08-14 DIAGNOSIS — R0789 Other chest pain: Secondary | ICD-10-CM

## 2014-08-16 LAB — HELICOBACTER PYLORI  SPECIAL ANTIGEN: H. PYLORI Antigen: NEGATIVE

## 2014-08-18 ENCOUNTER — Telehealth: Payer: Self-pay | Admitting: Gastroenterology

## 2014-08-18 NOTE — Telephone Encounter (Signed)
See results note. 

## 2014-08-18 NOTE — Telephone Encounter (Signed)
Left a message a for patient to call back.

## 2014-09-08 ENCOUNTER — Ambulatory Visit (AMBULATORY_SURGERY_CENTER): Payer: Medicare Other | Admitting: Internal Medicine

## 2014-09-08 ENCOUNTER — Encounter: Payer: Self-pay | Admitting: Internal Medicine

## 2014-09-08 VITALS — BP 138/69 | HR 46 | Temp 96.3°F | Resp 21 | Ht 61.0 in | Wt 182.0 lb

## 2014-09-08 DIAGNOSIS — R1013 Epigastric pain: Secondary | ICD-10-CM | POA: Diagnosis not present

## 2014-09-08 DIAGNOSIS — R079 Chest pain, unspecified: Secondary | ICD-10-CM

## 2014-09-08 DIAGNOSIS — R0789 Other chest pain: Secondary | ICD-10-CM

## 2014-09-08 DIAGNOSIS — K219 Gastro-esophageal reflux disease without esophagitis: Secondary | ICD-10-CM | POA: Diagnosis not present

## 2014-09-08 DIAGNOSIS — E119 Type 2 diabetes mellitus without complications: Secondary | ICD-10-CM | POA: Diagnosis not present

## 2014-09-08 MED ORDER — SODIUM CHLORIDE 0.9 % IV SOLN: 500.0000 mL | INTRAVENOUS | Status: DC

## 2014-09-08 NOTE — Patient Instructions (Signed)
Discharge instructions given. Normal exam. Resume previous medications. YOU HAD AN ENDOSCOPIC PROCEDURE TODAY AT THE [facility]: Refer to the procedure report that was given to you for any specific questions about what was found during the examination.  If the procedure report does not answer your questions, please call your gastroenterologist to clarify.  If you requested that your care partner not be given the details of your procedure findings, then the procedure report has been included in a sealed envelope for you to review at your convenience later.  YOU SHOULD EXPECT: Some feelings of bloating in the abdomen. Passage of more gas than usual.  Walking can help get rid of the air that was put into your GI tract during the procedure and reduce the bloating. If you had a lower endoscopy (such as a colonoscopy or flexible sigmoidoscopy) you may notice spotting of blood in your stool or on the toilet paper. If you underwent a bowel prep for your procedure, then you may not have a normal bowel movement for a few days.  DIET: Your first meal following the procedure should be a light meal and then it is ok to progress to your normal diet.  A half-sandwich or bowl of soup is an example of a good first meal.  Heavy or fried foods are harder to digest and may make you feel nauseous or bloated.  Likewise meals heavy in dairy and vegetables can cause extra gas to form and this can also increase the bloating.  Drink plenty of fluids but you should avoid alcoholic beverages for 24 hours.  ACTIVITY: Your care partner should take you home directly after the procedure.  You should plan to take it easy, moving slowly for the rest of the day.  You can resume normal activity the day after the procedure however you should NOT DRIVE or use heavy machinery for 24 hours (because of the sedation medicines used during the test).    SYMPTOMS TO REPORT IMMEDIATELY: A gastroenterologist can be reached at any hour.   During normal business hours, 8:30 AM to 5:00 PM Monday through Friday, call [phone].  After hours and on weekends, please call the GI answering service at [phone] who will take a message and have the physician on call contact you.    Following upper endoscopy (EGD)  Vomiting of blood or coffee ground material  New chest pain or pain under the shoulder blades  Painful or persistently difficult swallowing  New shortness of breath  Fever of 100F or higher  Black, tarry-looking stools  FOLLOW UP: If any biopsies were taken you will be contacted by phone or by letter within the next 1-3 weeks.  Call your gastroenterologist if you have not heard about the biopsies in 3 weeks.  Our staff will call the home number listed on your records the next business day following your procedure to check on you and address any questions or concerns that you may have at that time regarding the information given to you following your procedure. This is a courtesy call and so if there is no answer at the home number and we have not heard from you through the emergency physician on call, we will assume that you have returned to your regular daily activities without incident.  SIGNATURES/CONFIDENTIALITY: You and/or your care partner have signed paperwork which will be entered into your electronic medical record.  These signatures attest to the fact that that the information above on your After Visit Summary   has been reviewed and is understood.  Full responsibility of the confidentiality of this discharge information lies with you and/or your care-partner. 

## 2014-09-08 NOTE — Op Note (Signed)
[facility]  [address] [place], [address]   ENDOSCOPY PROCEDURE REPORT  PATIENT: Colleen [name], Colleen [name]  MR#: [id number] BIRTHDATE: 1946-02-06 , 68  yrs. old GENDER: female ENDOSCOPIST: Eustace Quail, MD REFERRED BY:  Blanchie Serve, M.D. PROCEDURE DATE:  09/08/2014 PROCEDURE:  EGD, diagnostic ASA CLASS:     Class II INDICATIONS:  chest pain and epigastric pain. . Burning discomfort. Mostly improved on twice a day PPI. No alarm features MEDICATIONS: Monitored anesthesia care and Propofol 80 mg IV TOPICAL ANESTHETIC: none  DESCRIPTION OF PROCEDURE: After the risks benefits and alternatives of the procedure were thoroughly explained, informed consent was obtained.  The LB [id number] endoscope was introduced through the mouth and advanced to the second portion of the duodenum , Without limitations.  The instrument was slowly withdrawn as the mucosa was fully examined.    EXAM: The esophagus and gastroesophageal junction were completely normal in appearance.  The stomach was entered and closely examined.The antrum, angularis, and lesser curvature were well visualized, including a retroflexed view of the cardia and fundus. The stomach wall was normally distensable.  The scope passed easily through the pylorus into the duodenum.  Retroflexed views revealed a hiatal hernia.     The scope was then withdrawn from the patient and the procedure completed.  COMPLICATIONS: There were no immediate complications.  ENDOSCOPIC IMPRESSION: 1. Normal upper endoscopy 2. GERD  RECOMMENDATIONS: 1.  Anti-reflux regimen to be followed 2.  Continue Pantoprazole 3. Return to the care of your primary provider  REPEAT EXAM:  eSigned:  Eustace Quail, MD 09/08/2014 11:37 AM    CC: Blanchie Serve MD and The Patient

## 2014-09-08 NOTE — Progress Notes (Signed)
A/ox3, pleased with MAC, report to RN 

## 2014-09-09 ENCOUNTER — Telehealth: Payer: Self-pay | Admitting: *Deleted

## 2014-09-09 NOTE — Telephone Encounter (Signed)
  Follow up Call-  Call back number 09/08/2014  Post procedure Call Back phone  # [phone]  Permission to leave phone message Yes     Patient questions:  Do you have a fever, pain , or abdominal swelling? No. Pain Score  0 *  Have you tolerated food without any problems? Yes.    Have you been able to return to your normal activities? Yes.    Do you have any questions about your discharge instructions: Diet   No. Medications  No. Follow up visit  No.  Do you have questions or concerns about your Care? No.  Actions: * If pain score is 4 or above: No action needed, pain <4.

## 2014-09-15 ENCOUNTER — Other Ambulatory Visit: Payer: Self-pay | Admitting: Internal Medicine

## 2014-10-07 ENCOUNTER — Ambulatory Visit: Payer: Medicare Other | Admitting: Internal Medicine

## 2014-10-09 ENCOUNTER — Encounter: Payer: Self-pay | Admitting: Nurse Practitioner

## 2014-10-09 ENCOUNTER — Ambulatory Visit (INDEPENDENT_AMBULATORY_CARE_PROVIDER_SITE_OTHER): Payer: Medicare Other | Admitting: Nurse Practitioner

## 2014-10-09 VITALS — BP 122/80 | HR 61 | Temp 99.1°F | Resp 12 | Ht 61.0 in | Wt 177.0 lb

## 2014-10-09 DIAGNOSIS — K5900 Constipation, unspecified: Secondary | ICD-10-CM | POA: Diagnosis not present

## 2014-10-09 DIAGNOSIS — R12 Heartburn: Secondary | ICD-10-CM

## 2014-10-09 DIAGNOSIS — R300 Dysuria: Secondary | ICD-10-CM | POA: Diagnosis not present

## 2014-10-09 DIAGNOSIS — M5442 Lumbago with sciatica, left side: Secondary | ICD-10-CM

## 2014-10-09 DIAGNOSIS — G2581 Restless legs syndrome: Secondary | ICD-10-CM

## 2014-10-09 LAB — POCT URINALYSIS DIPSTICK
Bilirubin, UA: NEGATIVE
Blood, UA: NEGATIVE
Glucose, UA: NEGATIVE
Ketones, UA: NEGATIVE
Leukocytes, UA: NEGATIVE
Nitrite, UA: NEGATIVE
Protein, UA: NEGATIVE
Spec Grav, UA: 1.01
Urobilinogen, UA: 0.2
pH, UA: 5

## 2014-10-09 MED ORDER — CIPROFLOXACIN HCL 500 MG PO TABS: 500.0000 mg | ORAL_TABLET | Freq: Two times a day (BID) | ORAL | Status: DC

## 2014-10-09 NOTE — Progress Notes (Signed)
Patient ID: Colleen [name], female   DOB: 1946-07-06, 68 y.o.   MRN: [id number]    PCP: Blanchie Serve, MD  No Known Allergies  Chief Complaint  Patient presents with  . Medical Management of Chronic Issues    3 month follow-up   . Immunizations    Low-grade fever, ? ok to get prevnar 13     HPI: Patient is a 68 y.o. female seen in the office today to follow up chronic conditions. Pt went to see GI. Had endoscopy and was dx with hiatal hernia, recommended for her to cont medications and no follow up given. Some days worse than others but still has burning. Adjusting diet to help with symptoms.  Having some issues with constipation. Trying to adjust diet to help with this as well  Back pain has resolved. Was taking tylenol and a rub and it has competed resolved.   Reports burning with urination for last 4 days. Taking Urinary relief tablets for 3 days. Increased frequency since burning has started. No abdominal complaints. Low grade fever today Review of Systems:  Review of Systems  Constitutional: Negative for activity change, appetite change, fatigue and unexpected weight change.  HENT: Negative for congestion and hearing loss.   Eyes: Negative.   Respiratory: Negative for cough and shortness of breath.   Cardiovascular: Negative for chest pain, palpitations and leg swelling.  Gastrointestinal: Positive for abdominal pain (related to [facility]) and constipation. Negative for diarrhea.  Genitourinary: Positive for dysuria and frequency. Negative for difficulty urinating.  Musculoskeletal: Negative for myalgias and arthralgias.  Skin: Negative for color change and wound.  Neurological: Negative for dizziness and weakness.  Psychiatric/Behavioral: Negative for behavioral problems, confusion and agitation.    Past Medical History  Diagnosis Date  . Hypothyroidism   . Corns and callosities   . Urge incontinence   . Screening for diabetes mellitus   . Arthritis   . Obesity   . GERD  (gastroesophageal reflux disease)   . Glaucoma    Past Surgical History  Procedure Laterality Date  . Abdominal hysterectomy  1985    Dr Mancel Bale  . Tonsillectomy  1951  . Thyroidectomy  1987    Dr Guadlupe Spanish  . Carpal tunnel release Bilateral 2010    Dr Laurance Flatten  . Bunionectomy Right     Dr Jomarie Longs  . Rotator cuff repair Right     Dr Theda Sers  . Cesarean section  1983  . Colonoscopy     Social History:   reports that she quit smoking about 27 years ago. Her smoking use included Cigarettes. She smoked 0.00 packs per day. She has never used smokeless tobacco. She reports that she does not drink alcohol or use illicit drugs.  Family History  Problem Relation Age of Onset  . Leukemia Mother 1  . Lung cancer Father   . Diabetes Brother   . COPD Brother   . Lung cancer Brother   . Diabetes Maternal Grandmother   . Cancer Maternal Grandfather     unknown type    Medications: Patient's Medications  New Prescriptions   No medications on file  Previous Medications   ACETAMINOPHEN (TYLENOL) 500 MG TABLET    Take 1,000 mg by mouth every 6 (six) hours as needed for mild pain.   ASCORBIC ACID (VITAMIN C PO)    Take 1 tablet by mouth daily.   ASPIRIN 81 MG TABLET    Take 81 mg by mouth daily.   CALCIUM-VITAMIN D PO  Take 2 tablets by mouth daily.    LEVOTHYROXINE (SYNTHROID, LEVOTHROID) 50 MCG TABLET    Take 1 tablet (50 mcg total) by mouth daily.   MULTIPLE VITAMIN (MULTIVITAMIN WITH MINERALS) TABS    Take 1 tablet by mouth daily.   NITROGLYCERIN (NITROSTAT) 0.4 MG SL TABLET    Place 1 tablet (0.4 mg total) under the tongue every 5 (five) minutes x 3 doses as needed for chest pain.   PANTOPRAZOLE (PROTONIX) 40 MG TABLET    Take 1 tablet (40 mg total) by mouth 2 (two) times daily.   POLYETHYLENE GLYCOL (MIRALAX / GLYCOLAX) PACKET    Take 17 g by mouth daily as needed for mild constipation.    ROPINIROLE (REQUIP) 0.25 MG TABLET    TAKE 1 TABLET BY MOUTH ONCE DAILY AFTER SUPPER. (TAKE 1-2  HOURS BEFORE GOING TO BED)  Modified Medications   No medications on file  Discontinued Medications   No medications on file     Physical Exam:  Filed Vitals:   10/09/14 1425  BP: 122/80  Pulse: 61  Temp: 99.1 F (37.3 C)  TempSrc: Oral  Resp: 12  Height: 5\' 1"  (1.549 m)  Weight: 177 lb (80.287 kg)  SpO2: 98%    Physical Exam  Constitutional: She is oriented to person, place, and time. She appears well-developed and well-nourished. No distress.  HENT:  Head: Normocephalic and atraumatic.  Mouth/Throat: Oropharynx is clear and moist. No oropharyngeal exudate.  Eyes: Conjunctivae are normal. Pupils are equal, round, and reactive to light.  Neck: Normal range of motion. Neck supple.  Cardiovascular: Normal rate, regular rhythm and normal heart sounds.   Pulmonary/Chest: Effort normal and breath sounds normal.  Abdominal: Soft. Bowel sounds are normal.  Musculoskeletal: She exhibits no edema or tenderness.  Neurological: She is alert and oriented to person, place, and time.  Skin: Skin is warm and dry. She is not diaphoretic.  Psychiatric: She has a normal mood and affect.    Labs reviewed: Basic Metabolic Panel:  Recent Labs  01/06/14 0807  02/20/14 0811 07/04/14 0920 07/04/14 1700 07/05/14 1200  NA 147*  < > 145* 144  --  141  K 4.0  < > 4.3 4.2  --  3.9  CL 106  < > 105 106  --  104  CO2 23  < > 27 25  --  27  GLUCOSE 87  < > 92 89  --  120*  BUN 15  < > 10 12  --  11  CREATININE 1.04*  < > 1.13* 0.97  --  0.92  CALCIUM 9.4  < > 9.6 9.3  --  9.1  TSH 1.110  --   --   --  2.190  --   < > = values in this interval not displayed. Liver Function Tests: No results for input(s): AST, ALT, ALKPHOS, BILITOT, PROT, ALBUMIN in the last 8760 hours. No results for input(s): LIPASE, AMYLASE in the last 8760 hours. No results for input(s): AMMONIA in the last 8760 hours. CBC:  Recent Labs  07/04/14 0920 07/05/14 1200  WBC 3.7* 3.0*  HGB 12.3 12.3  HCT 37.4 37.6   MCV 93.0 93.3  PLT 238 215   Lipid Panel:  Recent Labs  07/05/14 1200  CHOL 198  HDL 101  LDLCALC 88  TRIG 45  CHOLHDL 2.0   TSH:  Recent Labs  01/06/14 0807 07/04/14 1700  TSH 1.110 2.190   A1C: Lab Results  Component Value  Date   HGBA1C 5.6 07/05/2014     Assessment/Plan  1. Dysuria -dysuria with low grade temp. Will treat like UTI -to increase fluids - ciprofloxacin (CIPRO) 500 MG tablet; Take 1 tablet (500 mg total) by mouth 2 (two) times daily.  Dispense: 14 tablet; Refill: 0 - Urine culture - POCT urinalysis dipstick  2. Left-sided low back pain with left-sided sciatica resolved  3. Heartburn -diet modifications encouraged, cont on Protonix -to follow up with GI if symptoms worsen  4. RLS (restless legs syndrome) -controlled on medications  5. Constipation, unspecified constipation type To increase water intake, may take colace or benefiber daily to help with symptoms   To follow up with Dr Bubba Camp in 4 months or sooner if needed

## 2014-10-09 NOTE — Patient Instructions (Addendum)
To take Cipro 500 mg twice daily for 1 week  Drink lots of water   May use colace 100 mg daily for constipation  Follow up with Dr Bubba Camp in 4 months

## 2014-10-10 LAB — URINE CULTURE

## 2014-10-13 ENCOUNTER — Telehealth: Payer: Self-pay | Admitting: *Deleted

## 2014-10-13 NOTE — Telephone Encounter (Signed)
-----   Message from [name], NP sent at 10/12/2014  4:13 PM EST ----- No UTI noted, cont to increase fluid intake

## 2014-10-13 NOTE — Telephone Encounter (Signed)
Spoke with patient regarding urine culture results, she stated that she understood and would call the office if symptoms return.

## 2014-10-29 ENCOUNTER — Other Ambulatory Visit: Payer: Self-pay | Admitting: Internal Medicine

## 2014-11-03 DIAGNOSIS — Z23 Encounter for immunization: Secondary | ICD-10-CM | POA: Diagnosis not present

## 2014-11-20 ENCOUNTER — Other Ambulatory Visit: Payer: Self-pay

## 2014-11-20 DIAGNOSIS — Z1231 Encounter for screening mammogram for malignant neoplasm of breast: Secondary | ICD-10-CM

## 2014-12-23 ENCOUNTER — Encounter: Payer: Self-pay | Admitting: Internal Medicine

## 2014-12-23 ENCOUNTER — Ambulatory Visit (INDEPENDENT_AMBULATORY_CARE_PROVIDER_SITE_OTHER): Payer: Medicare Other | Admitting: Internal Medicine

## 2014-12-23 VITALS — BP 138/70 | HR 58 | Temp 98.2°F | Resp 20 | Ht 61.0 in | Wt 190.0 lb

## 2014-12-23 DIAGNOSIS — R635 Abnormal weight gain: Secondary | ICD-10-CM | POA: Diagnosis not present

## 2014-12-23 DIAGNOSIS — K219 Gastro-esophageal reflux disease without esophagitis: Secondary | ICD-10-CM | POA: Diagnosis not present

## 2014-12-23 DIAGNOSIS — K449 Diaphragmatic hernia without obstruction or gangrene: Secondary | ICD-10-CM

## 2014-12-23 HISTORY — DX: Gastro-esophageal reflux disease without esophagitis: K21.9

## 2014-12-23 NOTE — Progress Notes (Signed)
Patient ID: Colleen [name], female   DOB: [birth year], 69 y.o.   MRN: [id number]    Chief Complaint  Patient presents with  . Medical Management of Chronic Issues    surgical consult   No Known Allergies  HPI 69 y/o female patient is here with acute concerns. She would like a surgery referral to be provided for her hiatal hernia repair. Her reflux and hernia are bothering her and she would like to see if this can be fixed. She follows with GI. Has occassional nausea and discomfort in her abdomen. Taking her protonix.  ROS Negative for fever or chills Feels tired and bloated Taking her reflux medication Denies headache, dizziness or vomiting Denies chest pain or dyspnea  Past Medical History  Diagnosis Date  . Hypothyroidism   . Corns and callosities   . Urge incontinence   . Screening for diabetes mellitus   . Arthritis   . Obesity   . GERD (gastroesophageal reflux disease)   . Glaucoma    Medication reviewed. See [facility]  Physical exam BP 138/70 mmHg  Pulse 58  Temp(Src) 98.2 F (36.8 C) (Oral)  Resp 20  Ht 5\' 1"  (1.549 m)  Wt 190 lb (86.183 kg)  BMI 35.92 kg/m2  SpO2 97%   Wt Readings from Last 3 Encounters:  12/23/14 190 lb (86.183 kg)  10/09/14 177 lb (80.287 kg)  09/08/14 182 lb (82.555 kg)   General- elderly female in no acute distress Head- atraumatic, normocephalic Eyes- PERRLA, EOMI, no pallor, no icterus, no discharge Neck- no lymphadenopathy Throat- moist mucus membrane Cardiovascular- normal s1,s2, no murmurs Respiratory- bilateral clear to auscultation, no wheeze, no rhonchi, no crackles, no use of accessory muscles Abdomen- bowel sounds present, soft, non tender Musculoskeletal- able to move all 4 extremities, no leg edema Neurological- no focal deficit Skin- warm and dry Psychiatry- alert and oriented to person, place and time, normal mood and affect   Imaging  09/08/14 EGD ENDOSCOPIC IMPRESSION: 1. Normal upper endoscopy 2. GERD  Lab  Results  Component Value Date   TSH 2.190 07/04/2014   Lab Results  Component Value Date   HGBA1C 5.6 07/05/2014   Assessment/plan  1. Hiatal hernia with gastroesophageal reflux - Ambulatory referral to General Surgery - encouraged weight loss and explained that her weight gain can further worsen her hernia and reflux symptoms - continue PPI  2. Weight gain Assess for thyroid abnormality, continue current thyroid regimen - TSH

## 2014-12-24 ENCOUNTER — Ambulatory Visit
Admission: RE | Admit: 2014-12-24 | Discharge: 2014-12-24 | Disposition: A | Discharge status: Home / Self Care | Payer: Medicare Other | Source: Ambulatory Visit

## 2014-12-24 DIAGNOSIS — Z1231 Encounter for screening mammogram for malignant neoplasm of breast: Secondary | ICD-10-CM | POA: Diagnosis not present

## 2014-12-24 LAB — TSH: TSH: 1.13 u[IU]/mL (ref 0.450–4.500)

## 2014-12-31 ENCOUNTER — Telehealth: Payer: Self-pay | Admitting: Internal Medicine

## 2014-12-31 NOTE — Telephone Encounter (Signed)
Becky Sax from [facility] called to let us know that the patient needs a GI work up before this surgery can be done. Patient needs a GI referral for upper GI, Endoscopy, 24 hour probe & a Manometry   Once this is completed the surgery can be done. She will hold on to the referral

## 2014-12-31 NOTE — Telephone Encounter (Signed)
Can you let the patient know? She has been following with GI at [facility]- Dr Henrene Pastor. That way she will on the same page.Thanks

## 2014-12-31 NOTE — Telephone Encounter (Signed)
Pt scheduled to see Amy Esterwood PA 01/07/15@2pm . Pt aware of appt.

## 2014-12-31 NOTE — Telephone Encounter (Signed)
She just needs a routine office appointment. I doubt that she needs surgery. Have her see an APP

## 2014-12-31 NOTE — Telephone Encounter (Signed)
Pt states she had an EGD done in November, she has a hiatal hernia. Pt states she has been taking her medication but states it is not helping. Went back to her PCP and wants to have surgery done for there hiatal hernia. States she is having epigastric pain, nausea, bloating, and just hurting all the time. PCP referred to [facility] but was told pt needs GI workup done with upper gi, EGD, 24hour probe and manometry done before [facility] will see her. Please advise.

## 2015-01-02 MED ORDER — LEVOTHYROXINE SODIUM 50 MCG PO TABS: 50.0000 ug | ORAL_TABLET | Freq: Every day | ORAL | Status: DC

## 2015-01-02 NOTE — Telephone Encounter (Signed)
Patient aware she needs to see GI doctor. Patient with pending appointment next Wednesday

## 2015-01-07 ENCOUNTER — Other Ambulatory Visit (INDEPENDENT_AMBULATORY_CARE_PROVIDER_SITE_OTHER): Payer: Medicare Other

## 2015-01-07 ENCOUNTER — Ambulatory Visit (INDEPENDENT_AMBULATORY_CARE_PROVIDER_SITE_OTHER): Payer: Medicare Other | Admitting: Physician Assistant

## 2015-01-07 ENCOUNTER — Encounter: Payer: Self-pay | Admitting: Physician Assistant

## 2015-01-07 VITALS — BP 128/72 | HR 74 | Ht 61.0 in | Wt 191.0 lb

## 2015-01-07 DIAGNOSIS — R1010 Upper abdominal pain, unspecified: Secondary | ICD-10-CM

## 2015-01-07 DIAGNOSIS — R140 Abdominal distension (gaseous): Secondary | ICD-10-CM

## 2015-01-07 DIAGNOSIS — R112 Nausea with vomiting, unspecified: Secondary | ICD-10-CM

## 2015-01-07 LAB — COMPREHENSIVE METABOLIC PANEL
ALT: 12 U/L (ref 0–35)
AST: 25 U/L (ref 0–37)
Albumin: 4.3 g/dL (ref 3.5–5.2)
Alkaline Phosphatase: 108 U/L (ref 39–117)
BUN: 18 mg/dL (ref 6–23)
CO2: 30 mEq/L (ref 19–32)
Calcium: 10.4 mg/dL (ref 8.4–10.5)
Chloride: 107 mEq/L (ref 96–112)
Creatinine, Ser: 0.93 mg/dL (ref 0.40–1.20)
GFR: 76.5 mL/min (ref >60.00)
Glucose, Bld: 95 mg/dL (ref 70–99)
Potassium: 3.7 mEq/L (ref 3.5–5.1)
Sodium: 141 mEq/L (ref 135–145)
Total Bilirubin: 0.4 mg/dL (ref 0.2–1.2)
Total Protein: 7.5 g/dL (ref 6.0–8.3)

## 2015-01-07 LAB — CBC WITH DIFFERENTIAL/PLATELET
Basophils Absolute: 0.1 10*3/uL (ref 0.0–0.1)
Basophils Relative: 1 % (ref 0.0–3.0)
Eosinophils Absolute: 0.1 10*3/uL (ref 0.0–0.7)
Eosinophils Relative: 1.7 % (ref 0.0–5.0)
HCT: 37.4 % (ref 36.0–46.0)
Hemoglobin: 12.3 g/dL (ref 12.0–15.0)
Lymphocytes Relative: 48.9 % — ABNORMAL HIGH (ref 12.0–46.0)
Lymphs Abs: 2.4 10*3/uL (ref 0.7–4.0)
MCHC: 32.7 g/dL (ref 30.0–36.0)
MCV: 92.1 fl (ref 78.0–100.0)
Monocytes Absolute: 0.3 10*3/uL (ref 0.1–1.0)
Monocytes Relative: 5.3 % (ref 3.0–12.0)
Neutro Abs: 2.1 10*3/uL (ref 1.4–7.7)
Neutrophils Relative %: 43.1 % (ref 43.0–77.0)
Platelets: 246 10*3/uL (ref 150.0–400.0)
RBC: 4.07 Mil/uL (ref 3.87–5.11)
RDW: 13.9 % (ref 11.5–15.5)
WBC: 4.9 10*3/uL (ref 4.0–10.5)

## 2015-01-07 LAB — LIPASE: Lipase: 77 U/L — ABNORMAL HIGH (ref 11.0–59.0)

## 2015-01-07 MED ORDER — SUCRALFATE 1 GM/10ML PO SUSP: 1.0000 g | Freq: Three times a day (TID) | ORAL | Status: DC

## 2015-01-07 NOTE — Progress Notes (Signed)
Patient ID: Juniper Cobey, female   DOB: [birth year], 69 y.o.   MRN: [id number]   Subjective:    Patient ID: Bernadette Hoit, female    DOB: 1946-08-27, 69 y.o.   MRN: [id number]  HPI  Clairissa is a pleasant 69 year old African-American female known to Dr. Henrene Pastor. She has history of GERD and had undergone upper endoscopy in November 2015 for complaints of chest and epigastric pain. This was a normal exam. She was continued on protonix 40 mg by mouth twice daily. Patient also has history of hypothyroidism, restless leg syndrome, and chronic constipation. She comes in today because she says she is having ongoing problems with abdominal pain and nausea. She says she started having mild symptoms about a year ago and says that the symptoms have gradually progressed over the past 6-7 months. She is now having constant nausea. She has had some intermittent episodes of vomiting. Her appetite has been fair and she feels that she's eating less than she used to but has actually gained weight. Her weight is up about 15 pounds over the past year. She complains of abdominal bloating and distention and says that she's uncomfortable with any tight clothing on her abdomen because she feels distended all of the time. She has had frequent belching and burping and at times refluxes food. Also complains of a fullness sensation in her chest. She describes her pain as burning and aching in nature and fairly constant and sometimes worse after eating. Her bowel movements are constipated which is normal for her no melena or hematochezia. She says she takes a laxative on a daily basis-Pylori stool antigen was negative November 2015. She had had prior colonoscopy done in [facility] in 2013 with one tubular adenomatous polyp found.   Review of Systems Pertinent positive and negative review of systems were noted in the above HPI section.  All other review of systems was otherwise negative.  Outpatient Encounter Prescriptions as of 01/07/2015    Medication Sig  . acetaminophen (TYLENOL) 500 MG tablet Take 1,000 mg by mouth every 6 (six) hours as needed for mild pain.  . Ascorbic Acid (VITAMIN C PO) Take 1 tablet by mouth daily.  Marland Kitchen aspirin 81 MG tablet Take 81 mg by mouth daily.  Marland Kitchen CALCIUM-VITAMIN D PO Take 2 tablets by mouth daily.   Marland Kitchen levothyroxine (SYNTHROID, LEVOTHROID) 50 MCG tablet Take 1 tablet (50 mcg total) by mouth daily.  . Multiple Vitamin (MULTIVITAMIN WITH MINERALS) TABS Take 1 tablet by mouth daily.  . nitroGLYCERIN (NITROSTAT) 0.4 MG SL tablet Place 1 tablet (0.4 mg total) under the tongue every 5 (five) minutes x 3 doses as needed for chest pain.  . pantoprazole (PROTONIX) 40 MG tablet Take 1 tablet (40 mg total) by mouth 2 (two) times daily.  Marland Kitchen rOPINIRole (REQUIP) 0.25 MG tablet TAKE 1 TABLET BY MOUTH ONCE DAILY AFTER SUPPER. (TAKE 1-2 HOURS BEFORE GOING TO BED)  . sucralfate (CARAFATE) 1 GM/10ML suspension Take 10 mLs (1 g total) by mouth 4 (four) times daily -  with meals and at bedtime.   No Known Allergies Patient Active Problem List   Diagnosis Date Noted  . Hiatal hernia with gastroesophageal reflux 12/23/2014  . Atypical chest pain 08/13/2014  . Abdominal pain, epigastric 08/13/2014  . Left-sided low back pain with left-sided sciatica 08/12/2014  . Helicobacter pylori gastritis 08/12/2014  . Helicobacter pylori (H. pylori) infection 07/15/2014  . Skin spots-aging 07/08/2014  . RLS (restless legs syndrome) 07/08/2014  . Chest pain 07/04/2014  .  Heartburn 04/30/2014  . Headache(784.0) 04/30/2014  . Pain of right breast 04/30/2014  . Osteopenia 01/08/2014  . Restless leg syndrome 08/20/2013  . Unspecified constipation 08/20/2013  . Hypothyroidism 02/19/2013  . Callus of heel 02/19/2013  . Need for prophylactic vaccination against Streptococcus pneumoniae (pneumococcus) 02/19/2013  . Routine general medical examination at a health care facility 02/19/2013  . Postmenopausal estrogen deficiency  02/19/2013  . H/O total hysterectomy 02/19/2013  . Disorder of bone and cartilage, unspecified 02/19/2013   History   Social History  . Marital Status: Widowed    Spouse Name: N/A  . Number of Children: 1  . Years of Education: N/A   Occupational History  . retired    Social History Main Topics  . Smoking status: Former Smoker    Types: Cigarettes    Quit date: 10/31/1986  . Smokeless tobacco: Never Used  . Alcohol Use: No  . Drug Use: No  . Sexual Activity: Not on file   Other Topics Concern  . Not on file   Social History Narrative    Ms. Kindred's family history includes COPD in her brother; Cancer in her maternal grandfather; Diabetes in her brother and maternal grandmother; Leukemia (age of onset: 11) in her mother; Lung cancer in her brother and father.      Objective:    Filed Vitals:   01/07/15 1339  BP: 128/72  Pulse: 74    Physical Exam  well-developed older African-American female in no acute distress, pleasant blood pressure 128/72 pulse 74 height 5 foot 1 weight 191. HEENT;nontraumatic, cephalic EOMI PERRLA sclera anicteric, Neck ;supple no JVD, Cardiovascular; regular rate and rhythm with S1-S2 no murmur or gallop, Pulmonary; clear bilaterally, Abdomen; obese soft is tender in the right lower right mid quadrant and in the epigastrium there is no guarding or rebound no palpable mass or hepatosplenomegaly bowel sounds are present she has a hysterectomy scar in the lower abdomen, Rectal; exam not done, Ext; no clubbing cyanosis or edema skin warm and dry, Psych; mood and affect appropriate       Assessment & Plan:   #1 69 yo female with several month history of somewhat progressive upper abdominal pain and burning associated with nausea intermittent vomiting abdominal bloating and distention. Etiology of symptoms is not clear. She has been on a twice daily PPI without any relief for doubt acid related. Patient wasn't eating impression that she had a hiatal  hernia however no hiatal hernia was mentioned on the endoscopy report. We'll rule out gallbladder disease, gastroparesis, or intra-abdominal malignancy or other inflammatory process #2 status post total hysterectomy #3 chronic constipation #4 history of tubular adenomatous colon polyp 2013 will need follow-up colonoscopy 2018 #5Restless leg syndrome #6 Hypothyroidism  Plan; Decrease PPI to once daily  add Carafate slurry 1 gm between maeal and bedtime for 2-3 weeks Schedule for GE scan Schedule for CT abd/pelvis with contrast CBC, CMET, Lipase Follow up with dr. Henrene Pastor 3-4 weeks   Gladys Deckard Genia Harold PA-C 01/07/2015   Cc: [name], NP

## 2015-01-07 NOTE — Patient Instructions (Addendum)
Please go to the basement level to have your labs drawn.  We made you a follow up appointment with Dr Scarlette Shorts for 02-11-2015 at 2:15 PM.    You have been scheduled for a CT scan of the abdomen and pelvis at [facility] ([address]---this is in the same building as Press [facility]).   You are scheduled on Thursday 01-08-2015 at 3:30 PM . You should arrive at 3:15 PM  prior to your appointment time for registration. Please follow the written instructions below on the day of your exam:  WARNING: IF YOU ARE ALLERGIC TO IODINE/X-RAY DYE, PLEASE NOTIFY RADIOLOGY IMMEDIATELY AT [phone]! YOU WILL BE GIVEN A 13 HOUR PREMEDICATION PREP.  1) Do not eat  anything after 11:30 am  (4 hours prior to your test) 2) You have been given 2 bottles of oral contrast to drink. The solution may taste better if refrigerated, but do NOT add ice or any other liquid to this solution. Shake well before drinking.    Drink 1 bottle of contrast @ 1:30 PM (2 hours prior to your exam)  Drink 1 bottle of contrast @ 2:30 PM  (1 hour prior to your exam)  You may take any medications as prescribed with a small amount of water except for the following: Metformin, Glucophage, Glucovance, Avandamet, Riomet, Fortamet, Actoplus Met, Janumet, Glumetza or Metaglip. The above medications must be held the day of the exam AND 48 hours after the exam.  The purpose of you drinking the oral contrast is to aid in the visualization of your intestinal tract. The contrast solution may cause some diarrhea. Before your exam is started, you will be given a small amount of fluid to drink. Depending on your individual set of symptoms, you may also receive an intravenous injection of x-ray contrast/dye. Plan on being at [facility] for 30 minutes or long, depending on the type of exam you are having performed.  If you have any questions regarding your exam or if you need to reschedule, you may call the CT department at  [phone] between the hours of 8:00 am and 5:00 pm, Monday-Friday.    You have been scheduled for a gastric emptying scan at [facility] on 01-22-2015 at 7:00 am .Go to the [place].  Please arrive at 6:45 Am  to your appointment for registration. Please make certain not to have anything to eat or drink after midnight the night before your test. Hold all stomach medications (ex: Zofran, phenergan, Reglan Protonix) 8 hours prior to your test. If you need to reschedule your appointment, please contact radiology scheduling at [phone]. _____________________________________________________________________ A gastric-emptying study measures how long it takes for food to move through your stomach. There are several ways to measure stomach emptying. In the most common test, you eat food that contains a small amount of radioactive material. A scanner that detects the movement of the radioactive material is placed over your abdomen to monitor the rate at which food leaves your stomach. This test normally takes about 2 hours to complete. _____________________________________________________________________   ________________________________________________________________________

## 2015-01-08 ENCOUNTER — Ambulatory Visit (INDEPENDENT_AMBULATORY_CARE_PROVIDER_SITE_OTHER)
Admission: RE | Admit: 2015-01-08 | Discharge: 2015-01-08 | Disposition: A | Discharge status: Home / Self Care | Payer: Medicare Other | Source: Ambulatory Visit | Attending: Physician Assistant | Admitting: Physician Assistant

## 2015-01-08 DIAGNOSIS — R112 Nausea with vomiting, unspecified: Secondary | ICD-10-CM

## 2015-01-08 DIAGNOSIS — R140 Abdominal distension (gaseous): Secondary | ICD-10-CM

## 2015-01-08 DIAGNOSIS — K862 Cyst of pancreas: Secondary | ICD-10-CM | POA: Diagnosis not present

## 2015-01-08 DIAGNOSIS — K769 Liver disease, unspecified: Secondary | ICD-10-CM | POA: Diagnosis not present

## 2015-01-08 DIAGNOSIS — Z90710 Acquired absence of both cervix and uterus: Secondary | ICD-10-CM | POA: Diagnosis not present

## 2015-01-08 DIAGNOSIS — R1010 Upper abdominal pain, unspecified: Secondary | ICD-10-CM

## 2015-01-08 DIAGNOSIS — K5900 Constipation, unspecified: Secondary | ICD-10-CM | POA: Diagnosis not present

## 2015-01-08 MED ORDER — IOHEXOL 300 MG/ML  SOLN
100.0000 mL | Freq: Once | INTRAMUSCULAR | Status: AC | PRN
  Administered 2015-01-08: 100 mL via INTRAVENOUS

## 2015-01-08 MED ORDER — BARIUM SULFATE 2 % PO SUSP: 450.0000 mL | Freq: Once | ORAL | Status: DC

## 2015-01-08 NOTE — Progress Notes (Signed)
Agree with initial assessment and plans 

## 2015-01-12 ENCOUNTER — Telehealth: Payer: Self-pay | Admitting: Physician Assistant

## 2015-01-12 NOTE — Telephone Encounter (Signed)
All questions about the CT and medications answered.  She will call back for any additional questions or concerns.  She is aware that she will get a phone call with [facility] results.

## 2015-01-21 ENCOUNTER — Ambulatory Visit (INDEPENDENT_AMBULATORY_CARE_PROVIDER_SITE_OTHER): Payer: Medicare Other | Admitting: Internal Medicine

## 2015-01-21 ENCOUNTER — Encounter: Payer: Self-pay | Admitting: Internal Medicine

## 2015-01-21 VITALS — BP 140/70 | HR 64 | Temp 98.2°F | Resp 20 | Ht 61.0 in | Wt 190.2 lb

## 2015-01-21 DIAGNOSIS — H6122 Impacted cerumen, left ear: Secondary | ICD-10-CM | POA: Diagnosis not present

## 2015-01-21 DIAGNOSIS — H6501 Acute serous otitis media, right ear: Secondary | ICD-10-CM | POA: Diagnosis not present

## 2015-01-21 MED ORDER — AMOXICILLIN-POT CLAVULANATE 875-125 MG PO TABS: 1.0000 | ORAL_TABLET | Freq: Two times a day (BID) | ORAL | Status: DC

## 2015-01-21 MED ORDER — PANTOPRAZOLE SODIUM 40 MG PO TBEC
40.0000 mg | DELAYED_RELEASE_TABLET | Freq: Every day | ORAL | Status: DC
Start: 2015-01-21 — End: 2015-10-22

## 2015-01-21 NOTE — Patient Instructions (Addendum)
Continue debrox ear drops as needed.  Complete all of the antibiotic as ordered  Recommend you take probiotic (eg, culturelle, activia, etc) along with antibiotic to protect colon

## 2015-01-21 NOTE — Progress Notes (Signed)
Patient ID: Colleen [name], female   DOB: [birth year], 69 y.o.   MRN: [id number]    Facility  [facility]    Place of Service:   OFFICE   No Known Allergies  Chief Complaint  Patient presents with  . Acute Visit    Patient c/o ear ache in right ear, pain since Sunday    HPI:  69 yo female seen today for ear pain. She c/o 3 day hx earache not relieved with OTC ear gtts or Tylenol. No d/c, tinnitus or hearing loss. Pain is sharp 8-9 /10 on scale and interrupts sleep. No HA, dizziness, sore throat or swollen neck glands. No sinus pressure or rhinorrhea. No post nasal drip. She has nasal congestion in AM which is chronic. No sick contacts.  Past Medical History  Diagnosis Date  . Hypothyroidism   . Corns and callosities   . Urge incontinence   . Screening for diabetes mellitus   . Arthritis   . Obesity   . GERD (gastroesophageal reflux disease)   . Glaucoma    Past Surgical History  Procedure Laterality Date  . Abdominal hysterectomy  1985    Dr Mancel Bale  . Tonsillectomy  1951  . Thyroidectomy  1987    Dr Guadlupe Spanish  . Carpal tunnel release Bilateral 2010    Dr Laurance Flatten  . Bunionectomy Right     Dr Jomarie Longs  . Rotator cuff repair Right     Dr Theda Sers  . Cesarean section  1983  . Colonoscopy     History   Social History  . Marital Status: Widowed    Spouse Name: N/A  . Number of Children: 1  . Years of Education: N/A   Occupational History  . retired    Social History Main Topics  . Smoking status: Former Smoker    Types: Cigarettes    Quit date: 10/31/1986  . Smokeless tobacco: Never Used  . Alcohol Use: No  . Drug Use: No  . Sexual Activity: Not on file   Other Topics Concern  . None   Social History Narrative     Medications: Patient's Medications  New Prescriptions   No medications on file  Previous Medications   ACETAMINOPHEN (TYLENOL) 500 MG TABLET    Take 1,000 mg by mouth every 6 (six) hours as needed for mild pain.   ASCORBIC ACID (VITAMIN C PO)    Take  1 tablet by mouth daily.   ASPIRIN 81 MG TABLET    Take 81 mg by mouth daily.   CALCIUM-VITAMIN D PO    Take 2 tablets by mouth daily.    LEVOTHYROXINE (SYNTHROID, LEVOTHROID) 50 MCG TABLET    Take 1 tablet (50 mcg total) by mouth daily.   MULTIPLE VITAMIN (MULTIVITAMIN WITH MINERALS) TABS    Take 1 tablet by mouth daily.   NITROGLYCERIN (NITROSTAT) 0.4 MG SL TABLET    Place 1 tablet (0.4 mg total) under the tongue every 5 (five) minutes x 3 doses as needed for chest pain.   ROPINIROLE (REQUIP) 0.25 MG TABLET    TAKE 1 TABLET BY MOUTH ONCE DAILY AFTER SUPPER. (TAKE 1-2 HOURS BEFORE GOING TO BED)   SUCRALFATE (CARAFATE) 1 GM/10ML SUSPENSION    Take 10 mLs (1 g total) by mouth 4 (four) times daily -  with meals and at bedtime.  Modified Medications   Modified Medication Previous Medication   PANTOPRAZOLE (PROTONIX) 40 MG TABLET pantoprazole (PROTONIX) 40 MG tablet      Take 1 tablet (  40 mg total) by mouth daily. For stomach    Take 1 tablet (40 mg total) by mouth 2 (two) times daily.  Discontinued Medications   No medications on file     Review of Systems  Constitutional: Negative for fever, chills, diaphoresis, activity change, appetite change and fatigue.  HENT: Positive for congestion and ear pain. Negative for sore throat.   Eyes: Negative for visual disturbance.  Respiratory: Negative for cough, chest tightness and shortness of breath.   Cardiovascular: Negative for chest pain, palpitations and leg swelling.  Gastrointestinal: Negative for nausea, vomiting, abdominal pain, diarrhea, constipation and blood in stool.  Genitourinary: Negative for dysuria.  Musculoskeletal: Negative for arthralgias.  Neurological: Negative for dizziness, tremors, numbness and headaches.  Psychiatric/Behavioral: Positive for sleep disturbance. The patient is not nervous/anxious.     Filed Vitals:   01/21/15 1436  BP: 140/70  Pulse: 64  Temp: 98.2 F (36.8 C)  TempSrc: Oral  Resp: 20  Height: 5\' 1"   (1.549 m)  Weight: 190 lb 3.2 oz (86.274 kg)  SpO2: 98%   Body mass index is 35.96 kg/(m^2).  Physical Exam  Constitutional: No distress.  Looks uncomfortable in NAD. Awake and alert in NAD  HENT:  Head: Normocephalic and atraumatic.  Right Ear: Ear canal normal. No lacerations. No drainage, swelling or tenderness. No foreign bodies. No mastoid tenderness. Tympanic membrane is injected, erythematous and bulging. Tympanic membrane is not perforated and not retracted. A middle ear effusion is present. No hemotympanum. No decreased hearing is noted.  Left Ear: Hearing, external ear and ear canal normal.  Nose: Right sinus exhibits no maxillary sinus tenderness and no frontal sinus tenderness. Left sinus exhibits no maxillary sinus tenderness and no frontal sinus tenderness.  Mouth/Throat: Uvula is midline and mucous membranes are normal. No oropharyngeal exudate (but cobblestoning appearance).  Unable to visualize left TM due to cerumen impaction. No mastoid process TTP  Neck: Neck supple. No tracheal tenderness present. Carotid bruit is not present. No thyroid mass present.  Pulmonary/Chest: No stridor.  Lymphadenopathy:       Head (right side): No preauricular and no posterior auricular adenopathy present.       Head (left side): No preauricular and no posterior auricular adenopathy present.    She has no cervical adenopathy.       Right cervical: No superficial cervical and no posterior cervical adenopathy present.      Left cervical: No superficial cervical and no posterior cervical adenopathy present.       Right: No supraclavicular adenopathy present.       Left: No supraclavicular adenopathy present.  Skin: Skin is warm and dry. No rash noted.  Psychiatric: She has a normal mood and affect. Her behavior is normal.     Labs reviewed: Appointment on 01/07/2015  Component Date Value Ref Range Status  . WBC 01/07/2015 4.9  4.0 - 10.5 K/uL Final  . RBC 01/07/2015 4.07  3.87 - 5.11  Mil/uL Final  . Hemoglobin 01/07/2015 12.3  12.0 - 15.0 g/dL Final  . HCT 01/07/2015 37.4  36.0 - 46.0 % Final  . MCV 01/07/2015 92.1  78.0 - 100.0 fl Final  . MCHC 01/07/2015 32.7  30.0 - 36.0 g/dL Final  . RDW 01/07/2015 13.9  11.5 - 15.5 % Final  . Platelets 01/07/2015 246.0  150.0 - 400.0 K/uL Final  . Neutrophils Relative % 01/07/2015 43.1  43.0 - 77.0 % Final  . Lymphocytes Relative 01/07/2015 48.9* 12.0 - 46.0 % Final  .  Monocytes Relative 01/07/2015 5.3  3.0 - 12.0 % Final  . Eosinophils Relative 01/07/2015 1.7  0.0 - 5.0 % Final  . Basophils Relative 01/07/2015 1.0  0.0 - 3.0 % Final  . Neutro Abs 01/07/2015 2.1  1.4 - 7.7 K/uL Final  . Lymphs Abs 01/07/2015 2.4  0.7 - 4.0 K/uL Final  . Monocytes Absolute 01/07/2015 0.3  0.1 - 1.0 K/uL Final  . Eosinophils Absolute 01/07/2015 0.1  0.0 - 0.7 K/uL Final  . Basophils Absolute 01/07/2015 0.1  0.0 - 0.1 K/uL Final  . Sodium 01/07/2015 141  135 - 145 mEq/L Final  . Potassium 01/07/2015 3.7  3.5 - 5.1 mEq/L Final  . Chloride 01/07/2015 107  96 - 112 mEq/L Final  . CO2 01/07/2015 30  19 - 32 mEq/L Final  . Glucose, Bld 01/07/2015 95  70 - 99 mg/dL Final  . BUN 01/07/2015 18  6 - 23 mg/dL Final  . Creatinine, Ser 01/07/2015 0.93  0.40 - 1.20 mg/dL Final  . Total Bilirubin 01/07/2015 0.4  0.2 - 1.2 mg/dL Final  . Alkaline Phosphatase 01/07/2015 108  39 - 117 U/L Final  . AST 01/07/2015 25  0 - 37 U/L Final  . ALT 01/07/2015 12  0 - 35 U/L Final  . Total Protein 01/07/2015 7.5  6.0 - 8.3 g/dL Final  . Albumin 01/07/2015 4.3  3.5 - 5.2 g/dL Final  . Calcium 01/07/2015 10.4  8.4 - 10.5 mg/dL Final  . GFR 01/07/2015 76.50  >60.00 mL/min Final  . Lipase 01/07/2015 77.0* 11.0 - 59.0 U/L Final  Office Visit on 12/23/2014  Component Date Value Ref Range Status  . TSH 12/23/2014 1.130  0.450 - 4.500 uIU/mL Final     Assessment/Plan    ICD-9-CM ICD-10-CM   1. Right acute serous otitis media, recurrence not specified 381.01 H65.01  amoxicillin-clavulanate (AUGMENTIN) 875-125 MG per tablet  2. Cerumen impaction, left 380.4 H61.22     --Continue debrox ear drops as needed.  --Complete all of the antibiotic as ordered  --Recommend you take probiotic (eg, culturelle, activia, etc) along with antibiotic to protect colon  PROCEDURE NOTE  After verbal consent obtained, left ear lavaged and cerumen impaction removed using ear forceps and curette. No immediate complications. Left TM visualized- intact and without redness or bulging. No external ear canal swelling or d/c. Pt tolerated procedure well   Ranesha Val S. Perlie Gold  [facility] [address] [place], [place] [address] [phone] Office (Wednesdays and Fridays 8 AM - 5 PM) [phone] Cell (Monday-Friday 8 AM - 5 PM)

## 2015-01-22 ENCOUNTER — Ambulatory Visit (HOSPITAL_COMMUNITY)
Admission: RE | Admit: 2015-01-22 | Discharge: 2015-01-22 | Disposition: A | Discharge status: Home / Self Care | Payer: Medicare Other | Source: Ambulatory Visit | Attending: Physician Assistant | Admitting: Physician Assistant

## 2015-01-22 DIAGNOSIS — R140 Abdominal distension (gaseous): Secondary | ICD-10-CM | POA: Diagnosis not present

## 2015-01-22 DIAGNOSIS — R112 Nausea with vomiting, unspecified: Secondary | ICD-10-CM

## 2015-01-22 DIAGNOSIS — R1010 Upper abdominal pain, unspecified: Secondary | ICD-10-CM

## 2015-01-22 DIAGNOSIS — K219 Gastro-esophageal reflux disease without esophagitis: Secondary | ICD-10-CM | POA: Diagnosis not present

## 2015-01-22 MED ORDER — TECHNETIUM TC 99M SULFUR COLLOID: 2.0000 | Freq: Once | INTRAVENOUS | Status: AC | PRN

## 2015-02-10 ENCOUNTER — Ambulatory Visit: Payer: Medicare Other | Admitting: Internal Medicine

## 2015-02-11 ENCOUNTER — Ambulatory Visit (INDEPENDENT_AMBULATORY_CARE_PROVIDER_SITE_OTHER): Payer: Medicare Other | Admitting: Internal Medicine

## 2015-02-11 ENCOUNTER — Encounter: Payer: Self-pay | Admitting: Internal Medicine

## 2015-02-11 VITALS — BP 126/70 | HR 67

## 2015-02-11 DIAGNOSIS — R140 Abdominal distension (gaseous): Secondary | ICD-10-CM | POA: Diagnosis not present

## 2015-02-11 DIAGNOSIS — E669 Obesity, unspecified: Secondary | ICD-10-CM

## 2015-02-11 DIAGNOSIS — R935 Abnormal findings on diagnostic imaging of other abdominal regions, including retroperitoneum: Secondary | ICD-10-CM

## 2015-02-11 DIAGNOSIS — R1010 Upper abdominal pain, unspecified: Secondary | ICD-10-CM | POA: Diagnosis not present

## 2015-02-11 DIAGNOSIS — R748 Abnormal levels of other serum enzymes: Secondary | ICD-10-CM

## 2015-02-11 DIAGNOSIS — K5901 Slow transit constipation: Secondary | ICD-10-CM | POA: Diagnosis not present

## 2015-02-11 NOTE — Patient Instructions (Signed)
As discussed with Dr. Henrene Pastor, you may try Gas-X for bloating  Please follow up as needed

## 2015-02-11 NOTE — Progress Notes (Signed)
HISTORY OF PRESENT ILLNESS:  Colleen [name] is a 68 y.o. female with past medical history as listed below who was evaluated by the physician assistant 01/07/2015 for progressive upper abdominal discomfort with subjective burning, nausea, and bloating. She also has chronic constipation and a history of colon polyps. The patient was prescribed Carafate slurry which did not help. Gastric emptying scan was performed and returned normal. Laboratories were unremarkable including CBC, comprehensive metabolic panel, and TSH. She did have a mildly elevated lipase at 77. CT scan of the abdomen and pelvis revealed no acute process, changes consistent with constipation, and multiple cystic lesions within the pancreas measuring between 4 and 10 mm. Follow-up MRI in 1 year recommended. She presents today follow-up. Patient has had 10 pound weight gain over the past 6 months. Her chief complaint today is bloating with belching and uncomfortable tightness of her pants belt. She continues with constipation and requires daily laxatives for bowel movements. She complains of worsening abdominal discomfort with constipation. No new problems.. She does report walking in the mall 3 times per week and trying to watch her diet, though nonspecifically. She has multiple questions  REVIEW OF SYSTEMS:  All non-GI ROS negative except for arthritis  Past Medical History  Diagnosis Date  . Hypothyroidism   . Corns and callosities   . Urge incontinence   . Screening for diabetes mellitus   . Arthritis   . Obesity   . GERD (gastroesophageal reflux disease)   . Glaucoma   . Colon polyps     adenomatous  . Diverticulosis   . Anxiety   . Allergic rhinitis   . Hypercholesteremia     Past Surgical History  Procedure Laterality Date  . Abdominal hysterectomy  1985    Dr Mancel Bale  . Tonsillectomy  1951  . Thyroidectomy  1987    Dr Guadlupe Spanish  . Carpal tunnel release Bilateral 2010    Dr Laurance Flatten  . Bunionectomy Right     Dr  Jomarie Longs  . Rotator cuff repair Right     Dr Theda Sers  . Cesarean section  1983  . Colonoscopy      Social History Colleen [name]  reports that she quit smoking about 28 years ago. Her smoking use included Cigarettes. She has never used smokeless tobacco. She reports that she does not drink alcohol or use illicit drugs.  family history includes COPD in her brother; Cancer in her maternal grandfather; Colon cancer in her mother; Diabetes in her brother and maternal grandmother; Leukemia (age of onset: 72) in her mother; Lung cancer in her brother and father.  No Known Allergies     PHYSICAL EXAMINATION: Vital signs: BP 126/70 mmHg  Pulse 67  SpO2 98% General: Well-developed, well-nourished, no acute distress HEENT: Sclerae are anicteric, conjunctiva pink. Oral mucosa intact Lungs: Clear Heart: Regular Abdomen: soft, obese, nontender, nondistended, no obvious ascites, no peritoneal signs, normal bowel sounds. No organomegaly. There is tenderness over the anterior abdominal wall Extremities: No edema Psychiatric: alert and oriented x3. Cooperative   ASSESSMENT:  #1. Increased intestinal gas as manifested by bloating and belching. Ongoing problem #2. Chronic constipation. Ongoing problem #3. Abdominal discomfort related to #1 and #2 above #4. History of adenomatous colon polyps. Surveillance up-to-date #5. CT scan revealing diminutive pancreatic cysts. Amylase elevation noted. Doubt clinical complaints are related to this but needs to be followed as previously noted #6. Obesity   PLAN:  #1. Recommended Gas-X #2. Continue with bowel regimen to achieve 1-2 bowel movements daily.  May supplement with MiraLAX as I advised her #3. Surveillance colonoscopy around 2018 #4. Routine GI office follow-up in 6 months. Sooner if needed #5. MRI of the pancreas around March 2017. Recall in computer requested #6. Increase exercise and decrease meal portions to help achieve weight loss as this  may improve several abdominal complaints #7. Ongoing general medical care with PCP  25 minutes was spent face-to-face with this patient. Greater than 50% of the time spent counseling her regarding her above diagnoses, the plan, and answering a myriad of questions related to the same

## 2015-03-07 DIAGNOSIS — H2513 Age-related nuclear cataract, bilateral: Secondary | ICD-10-CM | POA: Diagnosis not present

## 2015-03-07 DIAGNOSIS — H40033 Anatomical narrow angle, bilateral: Secondary | ICD-10-CM | POA: Diagnosis not present

## 2015-04-20 ENCOUNTER — Ambulatory Visit: Payer: Medicare Other | Admitting: Nurse Practitioner

## 2015-04-21 ENCOUNTER — Encounter: Payer: Self-pay | Admitting: Nurse Practitioner

## 2015-04-21 ENCOUNTER — Ambulatory Visit (INDEPENDENT_AMBULATORY_CARE_PROVIDER_SITE_OTHER): Payer: Medicare Other | Admitting: Nurse Practitioner

## 2015-04-21 VITALS — BP 122/68 | HR 55 | Temp 98.2°F | Ht 61.0 in | Wt 183.6 lb

## 2015-04-21 DIAGNOSIS — K219 Gastro-esophageal reflux disease without esophagitis: Secondary | ICD-10-CM

## 2015-04-21 DIAGNOSIS — E038 Other specified hypothyroidism: Secondary | ICD-10-CM

## 2015-04-21 DIAGNOSIS — K5900 Constipation, unspecified: Secondary | ICD-10-CM

## 2015-04-21 DIAGNOSIS — G2581 Restless legs syndrome: Secondary | ICD-10-CM | POA: Diagnosis not present

## 2015-04-21 DIAGNOSIS — E669 Obesity, unspecified: Secondary | ICD-10-CM

## 2015-04-21 NOTE — Patient Instructions (Signed)
Increase fluid hydration

## 2015-04-21 NOTE — Progress Notes (Signed)
Patient ID: Colleen [name], female   DOB: June 11, 1946, 69 y.o.   MRN: [id number]    PCP: [name], NP  No Known Allergies  Chief Complaint  Patient presents with  . Medical Management of Chronic Issues    4 month follow up   HPI: Patient is a 69 y.o. female seen in the office today for routine follow up on chronic conditions.  Pt with hx of hiatal hernia, GERD, hypothyroid,  Pt following with GI due to [facility] and gerd, on protonix and gas-x. Does not notice much difference taking protonix, GI told her she could try to get off of it to see if symptoms changed.  Constipated, taking Doculax 10 mg daily keeps her bowels regular  Has lost weight Walking 3 days a week- going to the mall  Cutting back on portion-- cutting out sweets except on Sunday No complaints today  Review of Systems:  Review of Systems  Constitutional: Negative for activity change, appetite change, fatigue and unexpected weight change.  HENT: Negative for congestion and hearing loss.   Eyes: Negative.   Respiratory: Negative for cough and shortness of breath.   Cardiovascular: Negative for chest pain, palpitations and leg swelling.  Gastrointestinal: Negative for abdominal pain, diarrhea and constipation.  Genitourinary: Negative for dysuria and difficulty urinating.  Musculoskeletal: Negative for myalgias and arthralgias.  Skin: Negative for color change and wound.  Neurological: Negative for dizziness and weakness.  Psychiatric/Behavioral: Negative for behavioral problems, confusion and agitation.    Past Medical History  Diagnosis Date  . Hypothyroidism   . Corns and callosities   . Urge incontinence   . Screening for diabetes mellitus   . Arthritis   . Obesity   . GERD (gastroesophageal reflux disease)   . Glaucoma   . Colon polyps     adenomatous  . Diverticulosis   . Anxiety   . Allergic rhinitis   . Hypercholesteremia    Past Surgical History  Procedure Laterality Date  . Abdominal  hysterectomy  1985    Dr Mancel Bale  . Tonsillectomy  1951  . Thyroidectomy  1987    Dr Guadlupe Spanish  . Carpal tunnel release Bilateral 2010    Dr Laurance Flatten  . Bunionectomy Right     Dr Jomarie Longs  . Rotator cuff repair Right     Dr Theda Sers  . Cesarean section  1983  . Colonoscopy     Social History:   reports that she quit smoking about 28 years ago. Her smoking use included Cigarettes. She has never used smokeless tobacco. She reports that she does not drink alcohol or use illicit drugs.  Family History  Problem Relation Age of Onset  . Leukemia Mother 25  . Lung cancer Father   . Diabetes Brother   . COPD Brother   . Lung cancer Brother   . Diabetes Maternal Grandmother   . Cancer Maternal Grandfather     unknown type  . Colon cancer Mother     Medications: Patient's Medications  New Prescriptions   No medications on file  Previous Medications   ACETAMINOPHEN (TYLENOL) 500 MG TABLET    Take 1,000 mg by mouth every 6 (six) hours as needed for mild pain.   ASCORBIC ACID (VITAMIN C PO)    Take 1 tablet by mouth daily.   ASPIRIN 81 MG TABLET    Take 81 mg by mouth daily.   CALCIUM-VITAMIN D PO    Take 2 tablets by mouth daily.    LEVOTHYROXINE (  SYNTHROID, LEVOTHROID) 50 MCG TABLET    Take 1 tablet (50 mcg total) by mouth daily.   MULTIPLE VITAMIN (MULTIVITAMIN WITH MINERALS) TABS    Take 1 tablet by mouth daily.   NITROGLYCERIN (NITROSTAT) 0.4 MG SL TABLET    Place 1 tablet (0.4 mg total) under the tongue every 5 (five) minutes x 3 doses as needed for chest pain.   PANTOPRAZOLE (PROTONIX) 40 MG TABLET    Take 1 tablet (40 mg total) by mouth daily. For stomach   ROPINIROLE (REQUIP) 0.25 MG TABLET    TAKE 1 TABLET BY MOUTH ONCE DAILY AFTER SUPPER. (TAKE 1-2 HOURS BEFORE GOING TO BED)   SIMETHICONE (GAS RELIEF) 125 MG CAPS    Take 1 each by mouth 4 (four) times daily. As needed for gas relief  Modified Medications   No medications on file  Discontinued Medications   No medications on  file     Physical Exam:  Filed Vitals:   04/21/15 1255  BP: 122/68  Pulse: 55  Temp: 98.2 F (36.8 C)  TempSrc: Oral  Height: 5\' 1"  (1.549 m)  Weight: 183 lb 9.6 oz (83.28 kg)  Body mass index is 34.71 kg/(m^2).   Physical Exam  Constitutional: She is oriented to person, place, and time. She appears well-developed and well-nourished. No distress.  HENT:  Head: Normocephalic and atraumatic.  Mouth/Throat: Oropharynx is clear and moist. No oropharyngeal exudate.  Eyes: Conjunctivae are normal. Pupils are equal, round, and reactive to light.  Neck: Normal range of motion. Neck supple.  Cardiovascular: Normal rate, regular rhythm and normal heart sounds.   Pulmonary/Chest: Effort normal and breath sounds normal.  Abdominal: Soft. Bowel sounds are normal.  Musculoskeletal: She exhibits no edema or tenderness.  Neurological: She is alert and oriented to person, place, and time.  Skin: Skin is warm and dry. She is not diaphoretic.  Psychiatric: She has a normal mood and affect.    Labs reviewed: Basic Metabolic Panel:  Recent Labs  07/04/14 0920 07/04/14 1700 07/05/14 1200 12/23/14 1625 01/07/15 1521  NA 144  --  141  --  141  K 4.2  --  3.9  --  3.7  CL 106  --  104  --  107  CO2 25  --  27  --  30  GLUCOSE 89  --  120*  --  95  BUN 12  --  11  --  18  CREATININE 0.97  --  0.92  --  0.93  CALCIUM 9.3  --  9.1  --  10.4  TSH  --  2.190  --  1.130  --    Liver Function Tests:  Recent Labs  01/07/15 1521  AST 25  ALT 12  ALKPHOS 108  BILITOT 0.4  PROT 7.5  ALBUMIN 4.3    Recent Labs  01/07/15 1521  LIPASE 77.0*   No results for input(s): AMMONIA in the last 8760 hours. CBC:  Recent Labs  07/04/14 0920 07/05/14 1200 01/07/15 1521  WBC 3.7* 3.0* 4.9  NEUTROABS  --   --  2.1  HGB 12.3 12.3 12.3  HCT 37.4 37.6 37.4  MCV 93.0 93.3 92.1  PLT 238 215 246.0   Lipid Panel:  Recent Labs  07/05/14 1200  CHOL 198  HDL 101  LDLCALC 88  TRIG 45    CHOLHDL 2.0   TSH:  Recent Labs  07/04/14 1700 12/23/14 1625  TSH 2.190 1.130   A1C: Lab Results  Component  Value Date   HGBA1C 5.6 07/05/2014     Assessment/Plan 1. Other specified hypothyroidism -const on synthroid 50 mcg  - TSH; Future  2. RLS (restless legs syndrome) -conts on requip   3. Gastroesophageal reflux disease, esophagitis presence not specified -does not notice much changes on Protonix, can try off medication and see if there are worsening of symptoms   4. Constipation, unspecified constipation type -increased hydration, cont home regimen for constipation  5. Obesity -has lost weight -Discussed lifestyle modifications, to continue  -cont low fat, low sugar diet - Comprehensive metabolic panel; Future - Lipid panel; Future - CBC with Differential; Future  Follow up in 6 months with fasting lab work prior to appt, for EV with MMSE   Caleesi Kohl K. Harle Battiest  [facility] [phone] 8 am - 5 pm) [phone] (after hours)

## 2015-04-27 ENCOUNTER — Encounter: Payer: Self-pay | Admitting: Internal Medicine

## 2015-04-27 ENCOUNTER — Ambulatory Visit (INDEPENDENT_AMBULATORY_CARE_PROVIDER_SITE_OTHER): Payer: Medicare Other | Admitting: Internal Medicine

## 2015-04-27 VITALS — BP 112/70 | HR 48 | Ht 61.0 in | Wt 183.8 lb

## 2015-04-27 DIAGNOSIS — R079 Chest pain, unspecified: Secondary | ICD-10-CM

## 2015-04-27 NOTE — Patient Instructions (Signed)
Medication Instructions:  Your physician recommends that you continue on your current medications as directed. Please refer to the Current Medication list given to you today.   Labwork: none  Testing/Procedures: none  Follow-Up: Your physician recommends that you schedule a follow-up appointment as needed with Dr. Harrington Challenger

## 2015-04-27 NOTE — Progress Notes (Signed)
Cardiology Office Note   Date:  04/27/2015   ID:  Colleen [name], DOB 19-Jul-1946, MRN [id number]  PCP:  [name], NP  Cardiologist:   Dorris Carnes, MD   No chief complaint on file.  Pt presents for f/u of CP     History of Present Illness: Colleen [name] is a 69 y.o. female with a history of  CP  She was admitted   R/O for MI  Nuclear scan showed inferior scar vs artifact.  LVEF 51% with mild inf hypokinesis  Echo showed normal LVEF and mod TR   Pt underwent Rx for h pylori Endocsopy was normal  She has been seen by Rikki Spearing in clinic  Chronic constipation.  Increased gas.    Chest fine  No CP   Stomach burning sometimes    lipds were good in 2014  LDL was 97  HDL 74   Bloated   Gas X helps      Current Outpatient Prescriptions  Medication Sig Dispense Refill  . acetaminophen (TYLENOL) 500 MG tablet Take 1,000 mg by mouth every 6 (six) hours as needed for mild pain.    . Ascorbic Acid (VITAMIN C PO) Take 1 tablet by mouth daily.    Marland Kitchen aspirin 81 MG tablet Take 81 mg by mouth daily.    Marland Kitchen CALCIUM-VITAMIN D PO Take 2 tablets by mouth daily.     Marland Kitchen levothyroxine (SYNTHROID, LEVOTHROID) 50 MCG tablet Take 1 tablet (50 mcg total) by mouth daily. 90 tablet 3  . Multiple Vitamin (MULTIVITAMIN WITH MINERALS) TABS Take 1 tablet by mouth daily.    . nitroGLYCERIN (NITROSTAT) 0.4 MG SL tablet Place 1 tablet (0.4 mg total) under the tongue every 5 (five) minutes x 3 doses as needed for chest pain. 25 tablet 12  . pantoprazole (PROTONIX) 40 MG tablet Take 1 tablet (40 mg total) by mouth daily. For stomach 60 tablet 1  . rOPINIRole (REQUIP) 0.25 MG tablet TAKE 1 TABLET BY MOUTH ONCE DAILY AFTER SUPPER. (TAKE 1-2 HOURS BEFORE GOING TO BED) 90 tablet 3  . Simethicone (GAS RELIEF) 125 MG CAPS Take 1 each by mouth 4 (four) times daily. As needed for gas relief     No current facility-administered medications for this visit.    Allergies:   Review of patient's allergies indicates no  known allergies.   Past Medical History  Diagnosis Date  . Hypothyroidism   . Corns and callosities   . Urge incontinence   . Screening for diabetes mellitus   . Arthritis   . Obesity   . GERD (gastroesophageal reflux disease)   . Glaucoma   . Colon polyps     adenomatous  . Diverticulosis   . Anxiety   . Allergic rhinitis   . Hypercholesteremia     Past Surgical History  Procedure Laterality Date  . Abdominal hysterectomy  1985    Dr Mancel Bale  . Tonsillectomy  1951  . Thyroidectomy  1987    Dr Guadlupe Spanish  . Carpal tunnel release Bilateral 2010    Dr Laurance Flatten  . Bunionectomy Right     Dr Jomarie Longs  . Rotator cuff repair Right     Dr Theda Sers  . Cesarean section  1983  . Colonoscopy       Social History:  The patient  reports that she quit smoking about 28 years ago. Her smoking use included Cigarettes. She has never used smokeless tobacco. She reports that she does not drink alcohol or  use illicit drugs.   Family History:  The patient's family history includes COPD in her brother; Cancer in her maternal grandfather; Colon cancer in her mother; Diabetes in her brother and maternal grandmother; Leukemia (age of onset: 68) in her mother; Lung cancer in her brother and father.    ROS:  Please see the history of present illness. All other systems are reviewed and  Negative to the above problem except as noted.    PHYSICAL EXAM: VS:  BP 112/70 mmHg  Pulse 48  Ht 5\' 1"  (1.549 m)  Wt 183 lb 12.8 oz (83.371 kg)  BMI 34.75 kg/m2  SpO2 99%  GEN: Well nourished, well developed, in no acute distress HEENT: normal Neck: no JVD, carotid bruits, or masses Cardiac: RRR; no murmurs, rubs, or gallops,no edema  Respiratory:  clear to auscultation bilaterally, normal work of breathing GI: soft, nontender, nondistended, + BS  No hepatomegaly  MS: no deformity Moving all extremities   Skin: warm and dry, no rash Neuro:  Strength and sensation are intact Psych: euthymic mood, full  affect   EKG:  EKG is ordered today.  SB 48     Lipid Panel    Component Value Date/Time   CHOL 198 07/05/2014 1200   CHOL 181 02/13/2013 1048   TRIG 45 07/05/2014 1200   HDL 101 07/05/2014 1200   HDL 74 02/13/2013 1048   CHOLHDL 2.0 07/05/2014 1200   CHOLHDL 2.4 02/13/2013 1048   VLDL 9 07/05/2014 1200   LDLCALC 88 07/05/2014 1200   LDLCALC 97 02/13/2013 1048      Wt Readings from Last 3 Encounters:  04/27/15 183 lb 12.8 oz (83.371 kg)  04/21/15 183 lb 9.6 oz (83.28 kg)  01/21/15 190 lb 3.2 oz (86.274 kg)      ASSESSMENT AND PLAN:  1  CP denies   Noncardiac  2.  Mod TR  F/U over long term clinically  WIll be available as needed       Signed, Dorris Carnes, MD  04/27/2015 11:08 AM    [address], [place], [place]  [address] Phone: [phone]; Fax: [phone]

## 2015-05-18 ENCOUNTER — Encounter: Payer: Self-pay | Admitting: Nurse Practitioner

## 2015-06-30 DIAGNOSIS — Z23 Encounter for immunization: Secondary | ICD-10-CM | POA: Diagnosis not present

## 2015-10-19 ENCOUNTER — Other Ambulatory Visit: Payer: Medicare Other

## 2015-10-19 DIAGNOSIS — Z1159 Encounter for screening for other viral diseases: Secondary | ICD-10-CM | POA: Diagnosis not present

## 2015-10-19 DIAGNOSIS — E038 Other specified hypothyroidism: Secondary | ICD-10-CM

## 2015-10-19 DIAGNOSIS — E669 Obesity, unspecified: Secondary | ICD-10-CM | POA: Diagnosis not present

## 2015-10-20 LAB — CBC WITH DIFFERENTIAL/PLATELET
Basophils Absolute: 0 10*3/uL (ref 0.0–0.2)
Basos: 1 %
EOS (ABSOLUTE): 0.1 10*3/uL (ref 0.0–0.4)
Eos: 2 %
Hematocrit: 35.5 % (ref 34.0–46.6)
Hemoglobin: 11.5 g/dL (ref 11.1–15.9)
Immature Grans (Abs): 0 10*3/uL (ref 0.0–0.1)
Immature Granulocytes: 0 %
Lymphocytes Absolute: 1.9 10*3/uL (ref 0.7–3.1)
Lymphs: 54 %
MCH: 29.9 pg (ref 26.6–33.0)
MCHC: 32.4 g/dL (ref 31.5–35.7)
MCV: 92 fL (ref 79–97)
Monocytes Absolute: 0.2 10*3/uL (ref 0.1–0.9)
Monocytes: 5 %
Neutrophils Absolute: 1.3 10*3/uL — ABNORMAL LOW (ref 1.4–7.0)
Neutrophils: 38 %
Platelets: 242 10*3/uL (ref 150–379)
RBC: 3.84 x10E6/uL (ref 3.77–5.28)
RDW: 13.3 % (ref 12.3–15.4)
WBC: 3.5 10*3/uL (ref 3.4–10.8)

## 2015-10-20 LAB — COMPREHENSIVE METABOLIC PANEL
ALT: 10 IU/L (ref 0–32)
AST: 23 IU/L (ref 0–40)
Albumin/Globulin Ratio: 1.7 (ref 1.1–2.5)
Albumin: 3.8 g/dL (ref 3.5–4.8)
Alkaline Phosphatase: 89 IU/L (ref 39–117)
BUN/Creatinine Ratio: 13 (ref 11–26)
BUN: 14 mg/dL (ref 8–27)
Bilirubin Total: 0.4 mg/dL (ref 0.0–1.2)
CO2: 23 mmol/L (ref 18–29)
Calcium: 9.4 mg/dL (ref 8.7–10.3)
Chloride: 106 mmol/L (ref 96–106)
Creatinine, Ser: 1.07 mg/dL — ABNORMAL HIGH (ref 0.57–1.00)
GFR calc Af Amer: 60 mL/min/{1.73_m2} (ref >59)
GFR calc non Af Amer: 52 mL/min/{1.73_m2} — ABNORMAL LOW (ref >59)
Globulin, Total: 2.3 g/dL (ref 1.5–4.5)
Glucose: 85 mg/dL (ref 65–99)
Potassium: 3.9 mmol/L (ref 3.5–5.2)
Sodium: 145 mmol/L — ABNORMAL HIGH (ref 134–144)
Total Protein: 6.1 g/dL (ref 6.0–8.5)

## 2015-10-20 LAB — LIPID PANEL
Chol/HDL Ratio: 2.3 ratio units (ref 0.0–4.4)
Cholesterol, Total: 206 mg/dL — ABNORMAL HIGH (ref 100–199)
HDL: 91 mg/dL (ref >39)
LDL Calculated: 105 mg/dL — ABNORMAL HIGH (ref 0–99)
Triglycerides: 50 mg/dL (ref 0–149)
VLDL Cholesterol Cal: 10 mg/dL (ref 5–40)

## 2015-10-20 LAB — TSH: TSH: 0.758 u[IU]/mL (ref 0.450–4.500)

## 2015-10-22 ENCOUNTER — Ambulatory Visit (INDEPENDENT_AMBULATORY_CARE_PROVIDER_SITE_OTHER): Payer: Medicare Other | Admitting: Nurse Practitioner

## 2015-10-22 ENCOUNTER — Encounter: Payer: Self-pay | Admitting: Nurse Practitioner

## 2015-10-22 VITALS — BP 120/68 | HR 53 | Temp 97.7°F | Resp 20 | Ht 61.0 in | Wt 187.4 lb

## 2015-10-22 DIAGNOSIS — Z0000 Encounter for general adult medical examination without abnormal findings: Secondary | ICD-10-CM

## 2015-10-22 DIAGNOSIS — M545 Low back pain, unspecified: Secondary | ICD-10-CM

## 2015-10-22 DIAGNOSIS — K5900 Constipation, unspecified: Secondary | ICD-10-CM | POA: Diagnosis not present

## 2015-10-22 DIAGNOSIS — K219 Gastro-esophageal reflux disease without esophagitis: Secondary | ICD-10-CM

## 2015-10-22 DIAGNOSIS — H6121 Impacted cerumen, right ear: Secondary | ICD-10-CM

## 2015-10-22 DIAGNOSIS — E038 Other specified hypothyroidism: Secondary | ICD-10-CM

## 2015-10-22 MED ORDER — TETANUS-DIPHTH-ACELL PERTUSSIS 5-2.5-18.5 LF-MCG/0.5 IM SUSP: 0.5000 mL | Freq: Once | INTRAMUSCULAR | Status: DC

## 2015-10-22 MED ORDER — ROPINIROLE HCL 0.25 MG PO TABS: ORAL_TABLET | ORAL | Status: DC

## 2015-10-22 MED ORDER — LEVOTHYROXINE SODIUM 50 MCG PO TABS: 50.0000 ug | ORAL_TABLET | Freq: Every day | ORAL | Status: DC

## 2015-10-22 NOTE — Progress Notes (Signed)
Patient ID: Colleen [name], female   DOB: Jun 16, 1946, 69 y.o.   MRN: [id number]    PCP: [name], NP  Advanced Directive information Does patient have an advance directive?: Yes  No Known Allergies  Chief Complaint  Patient presents with  . Annual Exam    Annual Exam, Labs printed  . OTHER    Patient c/o having pain in left lower back started a few month ago and is getting worse     HPI: Patient is a 69 y.o. female seen in the office today for annual exam.   Screenings: Colon Cancer- colonoscopy 2013, every 10 years Breast Cancer- mamogram 2016, every year Cervical Cancer- complete hysterectomy  Osteoporosis- Dexa Scan completed in 2014 Depression screening Depression screen [facility] 2/9 10/22/2015 12/23/2014 08/12/2014 07/08/2014 02/19/2013  Decreased Interest 0 0 0 0 0  Down, Depressed, Hopeless 0 0 0 0 0  PHQ - 2 Score 0 0 0 0 0   Falls Fall Risk  10/22/2015 01/21/2015 12/23/2014 10/09/2014 08/12/2014  Falls in the past year? No No No No No   MMSE MMSE - Mini Mental State Exam 10/22/2015 07/08/2014  Not completed: (No Data) -  Orientation to time 5 5  Orientation to Place 5 5  Registration 3 3  Attention/ Calculation 5 5  Recall 3 2  Language- name 2 objects 2 2  Language- repeat 1 1  Language- follow 3 step command 2 3  Language- read & follow direction 1 1  Write a sentence 1 1  Copy design 1 1  Total score 29 29   Vaccines Immunization History  Administered Date(s) Administered  . Influenza,inj,Quad PF,36+ Mos 07/08/2014  . Influenza-Unspecified 08/09/2012, 08/01/2013, 06/30/2015  . Pneumococcal Conjugate-13 11/03/2014  . Pneumococcal Polysaccharide-23 02/19/2013  . Td 10/31/2000  . Varicella 06/15/2011  . Zoster 10/31/2010    Smoking status:.former smoker Alcohol use: none  Dentist: every 6 months Ophthalmologist: every year  Exercise regimen: was walking 3 days a week for 1 hour - but back has been hurting so decreased to 2 days a week for 30  mins.  Has exercise bike but this hurts her back as well. Diet: limiting sweets, attempts low calorie diet, eating a lot of meat and vegetables   Functional Status of ADLs: independent of all ADLs  Low left back pain, does not radiate down legs, has been going on for several months.  Using salon pas, muscle rub and tylenol which has helped.   Ongoing GERD and feeling bloated, stopped Protonix because it did not help. Feels like simethicone works best     Review of Systems:  Review of Systems  Constitutional: Negative for activity change, appetite change, fatigue and unexpected weight change.  HENT: Negative for congestion and hearing loss.   Eyes: Negative.   Respiratory: Negative for cough and shortness of breath.   Cardiovascular: Negative for chest pain, palpitations and leg swelling.  Gastrointestinal: Negative for abdominal pain, diarrhea and constipation.       Reports bloating and acid reflux, medication not very effective, has not followed up with GI  Genitourinary: Negative for dysuria and difficulty urinating.  Musculoskeletal: Positive for myalgias (to low back) and arthralgias.  Skin: Negative for color change and wound.  Neurological: Negative for dizziness and weakness.  Psychiatric/Behavioral: Negative for behavioral problems, confusion and agitation.  All other systems reviewed and are negative.   Past Medical History  Diagnosis Date  . Hypothyroidism   . Corns and callosities   . Urge  incontinence   . Screening for diabetes mellitus   . Arthritis   . Obesity   . GERD (gastroesophageal reflux disease)   . Glaucoma   . Colon polyps     adenomatous  . Diverticulosis   . Anxiety   . Allergic rhinitis   . Hypercholesteremia    Past Surgical History  Procedure Laterality Date  . Abdominal hysterectomy  1985    Dr Mancel Bale  . Tonsillectomy  1951  . Thyroidectomy  1987    Dr Guadlupe Spanish  . Carpal tunnel release Bilateral 2010    Dr Laurance Flatten  . Bunionectomy  Right     Dr Jomarie Longs  . Rotator cuff repair Right     Dr Theda Sers  . Cesarean section  1983  . Colonoscopy     Social History:   reports that she quit smoking about 28 years ago. Her smoking use included Cigarettes. She has never used smokeless tobacco. She reports that she does not drink alcohol or use illicit drugs.  Family History  Problem Relation Age of Onset  . Leukemia Mother 26  . Lung cancer Father   . Diabetes Brother   . COPD Brother   . Lung cancer Brother   . Diabetes Maternal Grandmother   . Cancer Maternal Grandfather     unknown type  . Colon cancer Mother     Medications: Patient's Medications  New Prescriptions   No medications on file  Previous Medications   ACETAMINOPHEN (TYLENOL) 500 MG TABLET    Take 1,000 mg by mouth every 6 (six) hours as needed for mild pain.   ASCORBIC ACID (VITAMIN C PO)    Take 1 tablet by mouth daily.   ASPIRIN 81 MG TABLET    Take 81 mg by mouth daily.   CALCIUM-VITAMIN D PO    Take 2 tablets by mouth daily.    LEVOTHYROXINE (SYNTHROID, LEVOTHROID) 50 MCG TABLET    Take 1 tablet (50 mcg total) by mouth daily.   MULTIPLE VITAMIN (MULTIVITAMIN WITH MINERALS) TABS    Take 1 tablet by mouth daily.   NITROGLYCERIN (NITROSTAT) 0.4 MG SL TABLET    Place 1 tablet (0.4 mg total) under the tongue every 5 (five) minutes x 3 doses as needed for chest pain.   ROPINIROLE (REQUIP) 0.25 MG TABLET    TAKE 1 TABLET BY MOUTH ONCE DAILY AFTER SUPPER. (TAKE 1-2 HOURS BEFORE GOING TO BED)   SIMETHICONE (GAS RELIEF) 125 MG CAPS    Take 1 each by mouth 4 (four) times daily. As needed for gas relief  Modified Medications   No medications on file  Discontinued Medications   PANTOPRAZOLE (PROTONIX) 40 MG TABLET    Take 1 tablet (40 mg total) by mouth daily. For stomach     Physical Exam:  Filed Vitals:   10/22/15 1034  BP: 120/68  Pulse: 53  Temp: 97.7 F (36.5 C)  TempSrc: Oral  Resp: 20  Height: 5\' 1"  (1.549 m)  Weight: 187 lb 6.4 oz (85.004  kg)  SpO2: 98%   Body mass index is 35.43 kg/(m^2).  Physical Exam  Constitutional: She is oriented to person, place, and time. She appears well-developed and well-nourished. No distress.  HENT:  Head: Normocephalic and atraumatic.  Right Ear: External ear normal.  Left Ear: External ear normal.  Nose: Nose normal.  Mouth/Throat: Oropharynx is clear and moist. No oropharyngeal exudate.  Eyes: Conjunctivae are normal. Pupils are equal, round, and reactive to light.  Neck: Normal range  of motion. Neck supple.  Cardiovascular: Normal rate, regular rhythm, normal heart sounds and intact distal pulses.   Pulmonary/Chest: Effort normal and breath sounds normal.  Abdominal: Soft. Bowel sounds are normal.  Musculoskeletal: Normal range of motion. She exhibits no edema.       Lumbar back: She exhibits tenderness.  Point tenderness noted to left lateral lumbar spine   Neurological: She is alert and oriented to person, place, and time. She displays normal reflexes. No cranial nerve deficit. Coordination normal.  Skin: Skin is warm and dry. She is not diaphoretic.  Psychiatric: She has a normal mood and affect.   Labs reviewed: Basic Metabolic Panel:  Recent Labs  12/23/14 1625 01/07/15 1521 10/19/15 0806  NA  --  141 145*  K  --  3.7 3.9  CL  --  107 106  CO2  --  30 23  GLUCOSE  --  95 85  BUN  --  18 14  CREATININE  --  0.93 1.07*  CALCIUM  --  10.4 9.4  TSH 1.130  --  0.758   Liver Function Tests:  Recent Labs  01/07/15 1521 10/19/15 0806  AST 25 23  ALT 12 10  ALKPHOS 108 89  BILITOT 0.4 0.4  PROT 7.5 6.1  ALBUMIN 4.3 3.8    Recent Labs  01/07/15 1521  LIPASE 77.0*   No results for input(s): AMMONIA in the last 8760 hours. CBC:  Recent Labs  01/07/15 1521 10/19/15 0806  WBC 4.9 3.5  NEUTROABS 2.1 1.3*  HGB 12.3  --   HCT 37.4 35.5  MCV 92.1  --   PLT 246.0  --    Lipid Panel:  Recent Labs  10/19/15 0806  CHOL 206*  HDL 91  LDLCALC 105*    TRIG 50  CHOLHDL 2.3   TSH:  Recent Labs  12/23/14 1625 10/19/15 0806  TSH 1.130 0.758   A1C: Lab Results  Component Value Date   HGBA1C 5.6 07/05/2014     Assessment/Plan 1. Medicare annual wellness visit, subsequent -pt is doing well. The patient was counseled regarding the appropriate use of alcohol, regular self-examination of the breasts on a monthly basis, prevention of dental and periodontal disease, diet, regular sustained exercise for at least 30 minutes 5 times per week, routine screening interval for mammogram as recommended by the [place] and ACOG,  and recommended schedule for GI hemoccult testing, colonoscopy, cholesterol, thyroid and diabetes screening. Depression screening negative, following with ophthalmology yearly. Exercise and diet modifications discussed -tdap Rx given  2. Gastroesophageal reflux disease, esophagitis presence not specified Ongoing, cont simethicone   3. Constipation, unspecified constipation type Controlled on otc laxative, to cont as needed as well as increasing hydration and activity   4. Other specified hypothyroidism TSH stable, cont current synthroid dose    5. Left-sided low back pain without sciatica May use heat to low back -cont salon pas patch and muscle rub  May use tylenol as needed for pain -no sciatica noted, to notify if pain worsens or sciatica occurs   6. Cerumen impaction, right Flushed with good effect. Pt tolerated well.   Follow up in 6 months sooner if needed  Shardae Kleinman K. Harle Battiest  [facility] [phone] 8 am - 5 pm) [phone] (after hours)

## 2015-10-22 NOTE — Patient Instructions (Signed)
Cont to use salon pas and muscle rubs to low back May use heating pad (but do not put heat on in combination with muscle rubs- may use muscle rubs after heat) Cont to use tylenol as needed for pain.   Exercise 30 mins 5 days a week Make sure to be eating proper diet, 3 meals a day and healthy snacks between meals.   Follow up in 6 months, sooner if needed

## 2015-10-23 LAB — HEPATITIS C ANTIBODY: Hep C Virus Ab: <0.1 s/co ratio (ref 0.0–0.9)

## 2015-10-23 LAB — SPECIMEN STATUS REPORT

## 2015-11-20 ENCOUNTER — Other Ambulatory Visit: Payer: Self-pay

## 2015-11-20 DIAGNOSIS — Z1231 Encounter for screening mammogram for malignant neoplasm of breast: Secondary | ICD-10-CM

## 2015-11-24 ENCOUNTER — Telehealth: Payer: Self-pay

## 2015-11-24 NOTE — Telephone Encounter (Signed)
Daughter called requesting pain medication for the patient.   Patient states that pain in lower back has become sharper than usual. Patient states that she has been following the current care plan but it does not seem to be working well. Pt states that she is taking Tylenol Arthritis every 6- 8 hours but still no relief. Pt is afraid to take advil due to renal failure. Daughter has given Pt one (1) 5/325mg  Norco that seemed to help and pt wonders if Rx for this is possible.   Also, pt asks if Left shoulder pain can be caused by acid reflux?  Cardiac tests have come back clear. GI discontinued protonix and told her to used OTC Gas X but that is no longer working.   Please advise

## 2015-11-24 NOTE — Telephone Encounter (Signed)
Will need OV for evaluation of stronger pain medication

## 2015-11-24 NOTE — Telephone Encounter (Signed)
Called patient back and notified her that an OV would be needed.   OV scheduled for 12/03/2015 at 3:15pm.

## 2015-12-03 ENCOUNTER — Encounter: Payer: Self-pay | Admitting: Nurse Practitioner

## 2015-12-03 ENCOUNTER — Ambulatory Visit (INDEPENDENT_AMBULATORY_CARE_PROVIDER_SITE_OTHER): Payer: Medicare Other | Admitting: Nurse Practitioner

## 2015-12-03 VITALS — BP 148/92 | HR 60 | Temp 97.7°F | Resp 20 | Ht 61.0 in | Wt 187.6 lb

## 2015-12-03 DIAGNOSIS — K219 Gastro-esophageal reflux disease without esophagitis: Secondary | ICD-10-CM

## 2015-12-03 DIAGNOSIS — M545 Low back pain, unspecified: Secondary | ICD-10-CM

## 2015-12-03 MED ORDER — TRAMADOL HCL 50 MG PO TABS: 50.0000 mg | ORAL_TABLET | Freq: Four times a day (QID) | ORAL | Status: DC | PRN

## 2015-12-03 MED ORDER — PANTOPRAZOLE SODIUM 40 MG PO TBEC: 40.0000 mg | DELAYED_RELEASE_TABLET | Freq: Every day | ORAL | Status: DC

## 2015-12-03 NOTE — Patient Instructions (Signed)
To start protonix 40 mg daily for acid reflux  To use ultram 50 mg every 6 hours as needed for pain Will refer you to physical therapy for back pain To get xray of lower back

## 2015-12-03 NOTE — Progress Notes (Signed)
Patient ID: Colleen [name], female   DOB: [birth year], 70 y.o.   MRN: [id number]    PCP: [name], NP  Advanced Directive information Does patient have an advance directive?: No, Would patient like information on creating an advanced directive?: Yes - Educational materials given  No Known Allergies  Chief Complaint  Patient presents with  . Medical Management of Chronic Issues    Discuss Pain Medication     HPI: Patient is a 70 y.o. female seen in the office today due to shoulder pain and back pain Pt with lumbar pain that goes and comes. Using tylenol, heating pad and muscle rubs which have not been helping.  Has not had done physical therapy or had imaging done for lower back. No numbness or tingling down legs.   Also reports shoulder pain that has been going on for several months. Had complete cardiac workup which was negative.  Hx of rotator cuff surgery on right.  Shoulder pain is behind shoulder blade. Worse when she eats or drinks. Increased indigestion. When she burps the pain is worse.  Hx of GERD- taking ranitidine 150 mg twice daily, feels like it may be helping but still notices discomfort . Also taking simethicone.    Review of Systems:  Review of Systems  Constitutional: Negative for activity change, appetite change, fatigue and unexpected weight change.  HENT: Negative for congestion and hearing loss.   Eyes: Negative.   Respiratory: Negative for cough and shortness of breath.   Cardiovascular: Negative for chest pain, palpitations and leg swelling.  Gastrointestinal: Negative for abdominal pain, diarrhea and constipation.       Reports bloating and acid reflux  Genitourinary: Negative for dysuria and difficulty urinating.  Musculoskeletal: Positive for myalgias (to low back) and arthralgias.  Skin: Negative for color change and wound.  Neurological: Negative for dizziness and weakness.  Psychiatric/Behavioral: Negative for behavioral problems, confusion  and agitation.  All other systems reviewed and are negative.   Past Medical History  Diagnosis Date  . Hypothyroidism   . Corns and callosities   . Urge incontinence   . Screening for diabetes mellitus   . Arthritis   . Obesity   . GERD (gastroesophageal reflux disease)   . Glaucoma   . Colon polyps     adenomatous  . Diverticulosis   . Anxiety   . Allergic rhinitis   . Hypercholesteremia    Past Surgical History  Procedure Laterality Date  . Abdominal hysterectomy  1985    Dr Mancel Bale  . Tonsillectomy  1951  . Thyroidectomy  1987    Dr Guadlupe Spanish  . Carpal tunnel release Bilateral 2010    Dr Laurance Flatten  . Bunionectomy Right     Dr Jomarie Longs  . Rotator cuff repair Right     Dr Theda Sers  . Cesarean section  1983  . Colonoscopy     Social History:   reports that she quit smoking about 29 years ago. Her smoking use included Cigarettes. She has never used smokeless tobacco. She reports that she does not drink alcohol or use illicit drugs.  Family History  Problem Relation Age of Onset  . Leukemia Mother 52  . Lung cancer Father   . Diabetes Brother   . COPD Brother   . Lung cancer Brother   . Diabetes Maternal Grandmother   . Cancer Maternal Grandfather     unknown type  . Colon cancer Mother     Medications: Patient's Medications  New Prescriptions  No medications on file  Previous Medications   ACETAMINOPHEN (TYLENOL) 500 MG TABLET    Take 1,000 mg by mouth every 6 (six) hours as needed for mild pain.   ACIDOPHILUS (RISAQUAD) CAPS CAPSULE    Take 1 capsule by mouth daily.   ASCORBIC ACID (VITAMIN C PO)    Take 1 tablet by mouth daily.   ASPIRIN 81 MG TABLET    Take 81 mg by mouth daily.   CALCIUM-VITAMIN D PO    Take 2 tablets by mouth daily.    LEVOTHYROXINE (SYNTHROID, LEVOTHROID) 50 MCG TABLET    Take 1 tablet (50 mcg total) by mouth daily.   MULTIPLE VITAMIN (MULTIVITAMIN WITH MINERALS) TABS    Take 1 tablet by mouth daily.   NITROGLYCERIN (NITROSTAT) 0.4 MG SL  TABLET    Place 1 tablet (0.4 mg total) under the tongue every 5 (five) minutes x 3 doses as needed for chest pain.   OMEGA-3 FATTY ACIDS (OMEGA 3 PO)    Take 1 gel cap daily   RANITIDINE (ZANTAC) 150 MG TABLET    Take 150 mg by mouth 2 (two) times daily.   ROPINIROLE (REQUIP) 0.25 MG TABLET    TAKE 1 TABLET BY MOUTH ONCE DAILY AFTER SUPPER. (TAKE 1-2 HOURS BEFORE GOING TO BED)   SIMETHICONE (GAS RELIEF) 125 MG CAPS    Take 1 each by mouth 4 (four) times daily. As needed for gas relief   TDAP (BOOSTRIX) 5-2.5-18.5 LF-MCG/0.5 INJECTION    Inject 0.5 mLs into the muscle once.  Modified Medications   No medications on file  Discontinued Medications   No medications on file     Physical Exam:  Filed Vitals:   12/03/15 1453  BP: 148/92  Pulse: 60  Temp: 97.7 F (36.5 C)  TempSrc: Oral  Resp: 20  Height: 5\' 1"  (1.549 m)  Weight: 187 lb 9.6 oz (85.095 kg)  SpO2: 98%   Body mass index is 35.47 kg/(m^2).  Physical Exam  Constitutional: She is oriented to person, place, and time. She appears well-developed and well-nourished. No distress.  Cardiovascular: Normal rate, regular rhythm, normal heart sounds and intact distal pulses.   Pulmonary/Chest: Effort normal and breath sounds normal.  Abdominal: Soft. Bowel sounds are normal. She exhibits no distension.  Musculoskeletal: Normal range of motion. She exhibits no edema.       Lumbar back: She exhibits tenderness.  Point tenderness noted to left lateral lumbar spine   Neurological: She is alert and oriented to person, place, and time. She displays normal reflexes. No cranial nerve deficit. Coordination normal.  Skin: Skin is warm and dry. She is not diaphoretic.  Psychiatric: She has a normal mood and affect.    Labs reviewed: Basic Metabolic Panel:  Recent Labs  12/23/14 1625 01/07/15 1521 10/19/15 0806  NA  --  141 145*  K  --  3.7 3.9  CL  --  107 106  CO2  --  30 23  GLUCOSE  --  95 85  BUN  --  18 14  CREATININE  --   0.93 1.07*  CALCIUM  --  10.4 9.4  TSH 1.130  --  0.758   Liver Function Tests:  Recent Labs  01/07/15 1521 10/19/15 0806  AST 25 23  ALT 12 10  ALKPHOS 108 89  BILITOT 0.4 0.4  PROT 7.5 6.1  ALBUMIN 4.3 3.8    Recent Labs  01/07/15 1521  LIPASE 77.0*   No results for  input(s): AMMONIA in the last 8760 hours. CBC:  Recent Labs  01/07/15 1521 10/19/15 0806  WBC 4.9 3.5  NEUTROABS 2.1 1.3*  HGB 12.3  --   HCT 37.4 35.5  MCV 92.1 92  PLT 246.0 242   Lipid Panel:  Recent Labs  10/19/15 0806  CHOL 206*  HDL 91  LDLCALC 105*  TRIG 50  CHOLHDL 2.3   TSH:  Recent Labs  12/23/14 1625 10/19/15 0806  TSH 1.130 0.758   A1C: Lab Results  Component Value Date   HGBA1C 5.6 07/05/2014     Assessment/Plan 1. Left-sided low back pain without sciatica - DG Lumbar Spine Complete for further evaluation - Ambulatory referral to Physical Therapy to evaluate and treat lumbar spine pain - traMADol (ULTRAM) 50 MG tablet; Take 1 tablet (50 mg total) by mouth every 6 (six) hours as needed.  Dispense: 90 tablet; Refill: 2  2. Gastroesophageal reflux disease, esophagitis presence not specified - encouraged lifestyle modifications, will also start protonix to see if this helps improve symptoms  - pantoprazole (PROTONIX) 40 MG tablet; Take 1 tablet (40 mg total) by mouth daily.  Dispense: 30 tablet; Refill: 3  To follow up in 4 weeks for follow up GERD and back pain  Lua Feng K. Harle Battiest  [facility] [phone] 8 am - 5 pm) [phone] (after hours)

## 2015-12-15 ENCOUNTER — Ambulatory Visit: Payer: Medicare Other | Attending: Nurse Practitioner | Admitting: Physical Therapy

## 2015-12-15 DIAGNOSIS — R29898 Other symptoms and signs involving the musculoskeletal system: Secondary | ICD-10-CM | POA: Insufficient documentation

## 2015-12-15 DIAGNOSIS — M25552 Pain in left hip: Secondary | ICD-10-CM | POA: Insufficient documentation

## 2015-12-15 DIAGNOSIS — R6889 Other general symptoms and signs: Secondary | ICD-10-CM

## 2015-12-15 DIAGNOSIS — M25669 Stiffness of unspecified knee, not elsewhere classified: Secondary | ICD-10-CM

## 2015-12-15 DIAGNOSIS — M2560 Stiffness of unspecified joint, not elsewhere classified: Secondary | ICD-10-CM

## 2015-12-15 DIAGNOSIS — M7918 Myalgia, other site: Secondary | ICD-10-CM

## 2015-12-15 DIAGNOSIS — M2569 Stiffness of other specified joint, not elsewhere classified: Secondary | ICD-10-CM

## 2015-12-15 DIAGNOSIS — M791 Myalgia: Secondary | ICD-10-CM | POA: Diagnosis not present

## 2015-12-15 NOTE — Therapy (Signed)
[place], [place], [address] Phone: [phone]   Fax:  [phone]  Physical Therapy Evaluation  Patient Details  Name: Colleen [name] MRN: [id number] Date of Birth: 04-Jan-1946 Referring Provider: Sherrie Mustache NP  Encounter Date: 12/15/2015      PT End of Session - 12/15/15 0854    Visit Number 1   Number of Visits 12   Date for PT Re-Evaluation 01/26/16   Authorization Type Medicare/    Pt has order for TENS and Iontophoresis   Authorization Time Period 01/26/16   PT Start Time 0755   PT Stop Time 0915   PT Time Calculation (min) 80 min   Activity Tolerance Patient tolerated treatment well   Behavior During Therapy [facility] for tasks assessed/performed      Past Medical History  Diagnosis Date  . Hypothyroidism   . Corns and callosities   . Urge incontinence   . Screening for diabetes mellitus   . Arthritis   . Obesity   . GERD (gastroesophageal reflux disease)   . Glaucoma   . Colon polyps     adenomatous  . Diverticulosis   . Anxiety   . Allergic rhinitis   . Hypercholesteremia     Past Surgical History  Procedure Laterality Date  . Abdominal hysterectomy  1985    Dr Mancel Bale  . Tonsillectomy  1951  . Thyroidectomy  1987    Dr Guadlupe Spanish  . Carpal tunnel release Bilateral 2010    Dr Laurance Flatten  . Bunionectomy Right     Dr Jomarie Longs  . Rotator cuff repair Right     Dr Theda Sers  . Cesarean section  1983  . Colonoscopy      There were no vitals filed for this visit.  Visit Diagnosis:  Left hip pain  Activity intolerance  Decreased ROM of trunk and back  Decreased ROM of lower extremity  Piriformis muscle pain      Subjective Assessment - 12/15/15 0802    Subjective I have been having left low back pain for last 3 months and it is getting worst,  Pain medicine does help   How long can you sit comfortably? 2-3 hours   How long can you stand comfortably? 30 minutes   How long  can you walk comfortably? 30 minutres   Currently in Pain? Yes   Pain Score 4   At worst  8/-9/10   Pain Location Back  piriformis/SI jt line   Pain Orientation Left   Pain Descriptors / Indicators Aching   Pain Type Chronic pain   Pain Onset More than a month ago   Pain Frequency Intermittent   Aggravating Factors  bending over, standing more than 30 minutes,  doing household chores   Pain Relieving Factors medication            [facility] PT Assessment - 12/15/15 0811    Assessment   Medical Diagnosis Left sided back pain without sciatica   Referring Provider Sherrie Mustache NP   Onset Date/Surgical Date 07/15/15   Hand Dominance Right   Prior Therapy none   Precautions   Precautions None   Restrictions   Weight Bearing Restrictions No   Balance Screen   Has the patient fallen in the past 6 months No   Has the patient had a decrease in activity level because of a fear of falling?  No   Is the patient reluctant to leave their home because of a fear of falling?  No   Home Environment   Living Environment Private residence   Living Arrangements Children   Home Access Level entry   [place] One level   Prior Function   Level of [place]   Overall Cognitive Status Within Functional Limits for tasks assessed   Observation/Other Assessments   Focus on Therapeutic Outcomes (FOTO)  Intake 41%, limitation 59%, predicted 43%   Squat   Comments Pt squats 60 degrees with wt bear to right   Posture/Postural Control   Posture/Postural Control Postural limitations   Postural Limitations Rounded Shoulders;Forward head;Anterior pelvic tilt   Posture Comments Pt with pelvic levels even   AROM   Right Hip Flexion 110   Right Hip External Rotation  30   Right Hip Internal Rotation  25   Left Hip Flexion 104  tight   Left Hip External Rotation  30   Left Hip Internal Rotation  13  tight and painful   Lumbar Flexion 75  pain on End range   Lumbar  Extension 27  marked pain on end range   Lumbar - Right Side Bend 25   Lumbar - Left Side Bend 15  pain   Lumbar - Right Rotation 75%  increased tightness/ pain   Lumbar - Left Rotation 75%   Palpation   Palpation comment piriformis muscle pain marked to palpation and along SI jt line, soft tissue pain                   [facility] Adult PT Treatment/Exercise - 12/15/15 0811    Self-Care   Self-Care Posture   Posture sitting and standing for sacral sitting habit   Exercises   Exercises Lumbar;Knee/Hip   Knee/Hip Exercises: Stretches   Piriformis Stretch 3 reps;60 seconds   Piriformis Stretch Limitations supine and in sitting.  initially using a towel to bring let knee to opposite shoulder   Modalities   Modalities Moist Heat   Moist Heat Therapy   Number Minutes Moist Heat 15 Minutes   Moist Heat Location Hip;Lumbar Spine  left piriformis , SI joint line   Manual Therapy   Manual Therapy Soft tissue mobilization   Soft tissue mobilization left piriformis and left gluteals          Trigger Point Dry Needling - 12/15/15 [id number]    Consent Given? Yes   Education Handout Provided Yes   Muscles Treated Lower Body Piriformis;Gluteus maximus  left side only   Gluteus Maximus Response Twitch response elicited;Palpable increased muscle length   Piriformis Response Twitch response elicited;Palpable increased muscle length              PT Education - 12/15/15 0902    Education provided Yes   Education Details POC, Explanation of findings. and piriformis stretch and posture sitting and standing   Person(s) Educated Patient   Methods Explanation;Demonstration;Handout;Verbal cues;Tactile cues   Comprehension Verbalized understanding;Returned demonstration          PT Short Term Goals - 12/15/15 0752    PT SHORT TERM GOAL #1   Title "Independent with initial HEP 01-05-16   Time 3   Period Weeks   Status New   PT SHORT TERM GOAL #2   Title "Report pain decrease  from  9 /10 to  4 /10  01-05-16   Time 3   Period Weeks   Status New   PT SHORT TERM GOAL #3   Title "Demonstrate understanding of proper sitting posture and be  more conscious of position and posture throughout the day.  01-05-16   Time 3   Period Weeks   Status New           PT Long Term Goals - December 17, 2015 [id number]    PT LONG TERM GOAL #1   Title "Demonstrate and verbalize techniques to reduce the risk of re-injury including: lifting, posture, body mechanics.    Time 6   Period Weeks   Status New   PT LONG TERM GOAL #2   Title "Pt will be independent with advanced HEP.    Time 6   Period Weeks   Status New   PT LONG TERM GOAL #3   Title "Pt will tolerate sitting for 1 hour without increased pain to ride in car without increased pain.   Time 6   Period Weeks   Status New   PT LONG TERM GOAL #4   Title "FOTO will improve from   59%  to   40%  indicating improved functional mobility .    Time 6   Period Weeks   Status New   PT LONG TERM GOAL #5   Title Pt will be able to return to exercise on stationary bike 4 x a week without increased pain in low back and left hip   Time 6   Period Weeks   Status New               Plan - Dec 17, 2015 [id number]    Clinical Impression Statement 70 yo female pt presents with left low back/hip pain ( piriformis and Left SI joint line) with specific trigger point.  Pt demonstrates deficits in LE strength, ROM and lumbar ROM with anterior lean/anterior pelvic tilt and also sacral sits during exam. Pt with tight left piriformis. Pt  consented  to trigger point dry needling for the specific trigger point pain with decrease of left sided pain from 9/10 to 4/10 post treatment.  Pt was monitored closely during RX.  Pt would benefit from sklled PT to alleviate pain, increase ROM  ,increase strength of core muscles to return to exericies and PLOF      Pt will benefit from skilled therapeutic intervention in order to improve on the following deficits  Pain;Decreased mobility;Decreased activity tolerance;Decreased strength;Increased fascial restricitons;Decreased range of motion;Postural dysfunction;Improper body mechanics   Rehab Potential Excellent   PT Frequency 2x / week   PT Duration 6 weeks   PT Treatment/Interventions ADLs/Self Care Home Management;Electrical Stimulation;Iontophoresis 4mg /ml Dexamethasone;Passive range of motion;Dry needling;Taping;Manual techniques;Patient/family education;Neuromuscular re-education;Ultrasound;Functional mobility training;Therapeutic exercise;Moist Heat;Cryotherapy  TENS   PT Next Visit Plan Assess dry needling and review piriformis exericise, progress to core and modalities as needed.  Pt can use iontophoresis   PT Home Exercise Plan Piriformis stretch and moist heat post dry needling   Consulted and Agree with Plan of Care Patient          G-Codes - 12-17-15 0850    Functional Assessment Tool Used FOTO   Functional Limitation Mobility: Walking and moving around   Mobility: Walking and Moving Around Current Status [phone]) At least 40 percent but less than 60 percent impaired, limited or restricted  59%   Mobility: Walking and Moving Around Goal Status [phone]) At least 20 percent but less than 40 percent impaired, limited or restricted  40%       Problem List Patient Active Problem List   Diagnosis Date Noted  . Hiatal hernia with gastroesophageal reflux 12/23/2014  . Atypical chest pain  08/13/2014  . Abdominal pain, epigastric 08/13/2014  . Left-sided low back pain with left-sided sciatica 08/12/2014  . Helicobacter pylori gastritis 08/12/2014  . Helicobacter pylori (H. pylori) infection 07/15/2014  . Skin spots-aging 07/08/2014  . RLS (restless legs syndrome) 07/08/2014  . Chest pain 07/04/2014  . Heartburn 04/30/2014  . Headache(784.0) 04/30/2014  . Pain of right breast 04/30/2014  . Osteopenia 01/08/2014  . Restless leg syndrome 08/20/2013  . Unspecified constipation 08/20/2013   . Hypothyroidism 02/19/2013  . Callus of heel 02/19/2013  . Need for prophylactic vaccination against Streptococcus pneumoniae (pneumococcus) 02/19/2013  . Routine general medical examination at a health care facility 02/19/2013  . Postmenopausal estrogen deficiency 02/19/2013  . H/O total hysterectomy 02/19/2013  . Disorder of bone and cartilage, unspecified 02/19/2013   Voncille Lo, PT 12/15/2015 9:21 AM Phone: [phone] Fax: [phone]   By signing I understand that I am ordering/authorizing the use of Iontophoresis using 4 mg/mL of dexamethasone as a component of this plan of care. [place], [place], [address] Phone: [phone]   Fax:  [phone]  Name: Tyyne [place] MRN: [id number] Date of Birth: 18-May-1946

## 2015-12-15 NOTE — Patient Instructions (Addendum)
Posture Tips DO: - stand tall and erect - keep chin tucked in - keep head and shoulders in alignment - check posture regularly in mirror or large window - pull head back against headrest in car seat;  Change your position often.  Sit with lumbar support. DON'T: - slouch or slump while watching TV or reading - sit, stand or lie in one position  for too long;  Sitting is especially hard on the spine so if you sit at a desk/use the computer, then stand up often!   Copyright  VHI. All rights reserved.  Posture - Standing   Good posture is important. Avoid slouching and forward head thrust. Maintain curve in low back and align ears over shoul- ders, hips over ankles.  Pull your belly button in toward your back bone. Stand with even weight in your toes and heels , ribs lifted up and chin down   Copyright  VHI. All rights reserved.  Posture - Sitting   Sit upright, head facing forward. Try using a roll to support lower back. Keep shoulders relaxed, and avoid rounded back. Keep hips level with knees. Avoid crossing legs for long periods. Sit on sit bones and not tailbone.     Hip Stretch  Put right ankle over left knee. Let right knee fall downward, but keep ankle in place. Feel the stretch in hip. May push down gently with hand to feel stretch. Hold ____ seconds while counting out loud. Repeat with other leg. Repeat __3__ times. Do __3__ sessions per day.  YOu can do 3 total stretches a day  . You can use this exercise and then the one below for total 3 sessions   Stretching: Piriformis (Supine)  Pull right knee toward opposite shoulder. Hold _30-60___ seconds. Relax. Repeat _3___ times per set. Do _1___ sets per session. Do __2-3__ sessions per day.  Trigger Point Dry Needling  . What is Trigger Point Dry Needling (DN)? o DN is a physical therapy technique used to treat muscle pain and dysfunction. Specifically, DN helps deactivate muscle trigger points (muscle knots).  o A thin  filiform needle is used to penetrate the skin and stimulate the underlying trigger point. The goal is for a local twitch response (LTR) to occur and for the trigger point to relax. No medication of any kind is injected during the procedure.   . What Does Trigger Point Dry Needling Feel Like?  o The procedure feels different for each individual patient. Some patients report that they do not actually feel the needle enter the skin and overall the process is not painful. Very mild bleeding may occur. However, many patients feel a deep cramping in the muscle in which the needle was inserted. This is the local twitch response.   Marland Kitchen How Will I feel after the treatment? o Soreness is normal, and the onset of soreness may not occur for a few hours. Typically this soreness does not last longer than two days.  o Bruising is uncommon, however; ice can be used to decrease any possible bruising.  o In rare cases feeling tired or nauseous after the treatment is normal. In addition, your symptoms may get worse before they get better, this period will typically not last longer than 24 hours.   . What Can I do After My Treatment? o Increase your hydration by drinking more water for the next 24 hours. o You may place ice or heat on the areas treated that have become sore, however, do not use  heat on inflamed or bruised areas. Heat often brings more relief post needling. o You can continue your regular activities, but vigorous activity is not recommended initially after the treatment for 24 hours. o DN is best combined with other physical therapy such as strengthening, stretching, and other therapies.    Please use a heating pad throughout day  10 minutes at a time and use when you feel sore as needed.  Voncille Lo, PT 12/15/2015 8:28 AM Phone: [phone] Fax: [phone]

## 2015-12-17 ENCOUNTER — Ambulatory Visit: Payer: Medicare Other | Admitting: Physical Therapy

## 2015-12-17 DIAGNOSIS — M25552 Pain in left hip: Secondary | ICD-10-CM

## 2015-12-17 DIAGNOSIS — R6889 Other general symptoms and signs: Secondary | ICD-10-CM | POA: Diagnosis not present

## 2015-12-17 DIAGNOSIS — M25669 Stiffness of unspecified knee, not elsewhere classified: Secondary | ICD-10-CM

## 2015-12-17 DIAGNOSIS — M2569 Stiffness of other specified joint, not elsewhere classified: Secondary | ICD-10-CM

## 2015-12-17 DIAGNOSIS — M791 Myalgia: Secondary | ICD-10-CM | POA: Diagnosis not present

## 2015-12-17 DIAGNOSIS — R29898 Other symptoms and signs involving the musculoskeletal system: Secondary | ICD-10-CM | POA: Diagnosis not present

## 2015-12-17 DIAGNOSIS — M2560 Stiffness of unspecified joint, not elsewhere classified: Secondary | ICD-10-CM

## 2015-12-17 DIAGNOSIS — M7918 Myalgia, other site: Secondary | ICD-10-CM

## 2015-12-17 NOTE — Therapy (Signed)
[place], [place], [address] Phone: [phone]   Fax:  [phone]  Physical Therapy Treatment  Patient Details  Name: Colleen [name] MRN: [id number] Date of Birth: [birth year] Referring Provider: Sherrie Mustache NP  Encounter Date: 12/17/2015      PT End of Session - 12/17/15 1752    Visit Number 2   Number of Visits 12   Date for PT Re-Evaluation 01/26/16   PT Start Time 1331   PT Stop Time 1430   PT Time Calculation (min) 59 min   Activity Tolerance Patient tolerated treatment well   Behavior During Therapy [facility] for tasks assessed/performed      Past Medical History  Diagnosis Date  . Hypothyroidism   . Corns and callosities   . Urge incontinence   . Screening for diabetes mellitus   . Arthritis   . Obesity   . GERD (gastroesophageal reflux disease)   . Glaucoma   . Colon polyps     adenomatous  . Diverticulosis   . Anxiety   . Allergic rhinitis   . Hypercholesteremia     Past Surgical History  Procedure Laterality Date  . Abdominal hysterectomy  1985    Dr Mancel Bale  . Tonsillectomy  1951  . Thyroidectomy  1987    Dr Guadlupe Spanish  . Carpal tunnel release Bilateral 2010    Dr Laurance Flatten  . Bunionectomy Right     Dr Jomarie Longs  . Rotator cuff repair Right     Dr Theda Sers  . Cesarean section  1983  . Colonoscopy      There were no vitals filed for this visit.  Visit Diagnosis:  Left hip pain  Activity intolerance  Decreased ROM of trunk and back  Decreased ROM of lower extremity  Piriformis muscle pain      Subjective Assessment - 12/17/15 1332    Subjective I have been doing exercises  and exercises stir it up.  then it feels a little better. She had extra hurt after Dry Needle into thigh.  Not sure if it helped.     Currently in Pain? No/denies   Pain Score 2    Aggravating Factors  Lifting Lt leg over into tub.     Pain Relieving Factors medication, heating pad                          [facility] Adult PT Treatment/Exercise - 12/17/15 1338    Self-Care   Self-Care Posture   Posture education   Lumbar Exercises: Stretches   Passive Hamstring Stretch 3 reps;30 seconds   Pelvic Tilt 5 reps   Lumbar Exercises: Supine   Clam 5 reps  with ab set   Bridge --  7 X   Knee/Hip Exercises: Stretches   Passive Hamstring Stretch 3 reps;30 seconds   Piriformis Stretch 30 seconds  3 reps   Piriformis Stretch Limitations 3   Knee/Hip Exercises: Sidelying   Clams 5 X 2 seet,  cues breating, technique   Modalities   Modalities Moist Heat   Moist Heat Therapy   Number Minutes Moist Heat 15 Minutes   Moist Heat Location Hip  Lumbar   Electrical Stimulation   Electrical Stimulation Location hip, back   Electrical Stimulation Action IFC   Electrical Stimulation Parameters to tolerance   Electrical Stimulation Goals Pain   Manual Therapy   Manual Therapy Soft tissue mobilization   Soft tissue mobilization lT piriformis followed by  passive stretch,  gentle                PT Education - 12/17/15 1752    Education provided Yes   Education Details hamstrings   Person(s) Educated Patient   Methods Explanation;Demonstration;Tactile cues;Verbal cues;Handout   Comprehension Verbalized understanding;Returned demonstration;Need further instruction          PT Short Term Goals - 12/15/15 0752    PT SHORT TERM GOAL #1   Title "Independent with initial HEP 01-05-16   Time 3   Period Weeks   Status New   PT SHORT TERM GOAL #2   Title "Report pain decrease from  9 /10 to  4 /10  01-05-16   Time 3   Period Weeks   Status New   PT SHORT TERM GOAL #3   Title "Demonstrate understanding of proper sitting posture and be more conscious of position and posture throughout the day.  01-05-16   Time 3   Period Weeks   Status New           PT Long Term Goals - 12/15/15 2951    PT LONG TERM GOAL #1   Title "Demonstrate and verbalize  techniques to reduce the risk of re-injury including: lifting, posture, body mechanics.    Time 6   Period Weeks   Status New   PT LONG TERM GOAL #2   Title "Pt will be independent with advanced HEP.    Time 6   Period Weeks   Status New   PT LONG TERM GOAL #3   Title "Pt will tolerate sitting for 1 hour without increased pain to ride in car without increased pain.   Time 6   Period Weeks   Status New   PT LONG TERM GOAL #4   Title "FOTO will improve from   59%  to   40%  indicating improved functional mobility .    Time 6   Period Weeks   Status New   PT LONG TERM GOAL #5   Title Pt will be able to return to exercise on stationary bike 4 x a week without increased pain in low back and left hip   Time 6   Period Weeks   Status New               Plan - 12/17/15 1753    Clinical Impression Statement continued to work on piriformis and pain relief. No new goals met.   PT Next Visit Plan progress home exercises   PT Home Exercise Plan hamstring stretch   Consulted and Agree with Plan of Care Patient        Problem List Patient Active Problem List   Diagnosis Date Noted  . Hiatal hernia with gastroesophageal reflux 12/23/2014  . Atypical chest pain 08/13/2014  . Abdominal pain, epigastric 08/13/2014  . Left-sided low back pain with left-sided sciatica 08/12/2014  . Helicobacter pylori gastritis 08/12/2014  . Helicobacter pylori (H. pylori) infection 07/15/2014  . Skin spots-aging 07/08/2014  . RLS (restless legs syndrome) 07/08/2014  . Chest pain 07/04/2014  . Heartburn 04/30/2014  . Headache(784.0) 04/30/2014  . Pain of right breast 04/30/2014  . Osteopenia 01/08/2014  . Restless leg syndrome 08/20/2013  . Unspecified constipation 08/20/2013  . Hypothyroidism 02/19/2013  . Callus of heel 02/19/2013  . Need for prophylactic vaccination against Streptococcus pneumoniae (pneumococcus) 02/19/2013  . Routine general medical examination at a health care facility  02/19/2013  . Postmenopausal estrogen deficiency 02/19/2013  .  H/O total hysterectomy 02/19/2013  . Disorder of bone and cartilage, unspecified 02/19/2013    [facility] 12/17/2015, 6:14 PM  [facility] [address] [place], [place], [address] Phone: [phone]   Fax:  [phone]  Name: Colleen [name] MRN: [id number] Date of Birth: 1946/07/30    Melvenia Needles, PTA 12/17/2015 6:14 PM Phone: [phone] Fax: [phone]

## 2015-12-17 NOTE — Patient Instructions (Signed)
Hamstring: Towel Stretch (Supine)   3 Lie on back. Loop towel around left foot, hip and knee at 90. Straighten knee and pull foot toward body. Hold _30__ seconds. Relax. Repeat 3__ times. Do 1___ times a day. Repeat with other leg.    Copyright  VHI. All rights reserved.

## 2015-12-21 ENCOUNTER — Ambulatory Visit: Payer: Medicare Other | Admitting: Physical Therapy

## 2015-12-21 DIAGNOSIS — R29898 Other symptoms and signs involving the musculoskeletal system: Secondary | ICD-10-CM | POA: Diagnosis not present

## 2015-12-21 DIAGNOSIS — R6889 Other general symptoms and signs: Secondary | ICD-10-CM

## 2015-12-21 DIAGNOSIS — M2569 Stiffness of other specified joint, not elsewhere classified: Secondary | ICD-10-CM

## 2015-12-21 DIAGNOSIS — M7918 Myalgia, other site: Secondary | ICD-10-CM

## 2015-12-21 DIAGNOSIS — M25552 Pain in left hip: Secondary | ICD-10-CM | POA: Diagnosis not present

## 2015-12-21 DIAGNOSIS — M25669 Stiffness of unspecified knee, not elsewhere classified: Secondary | ICD-10-CM

## 2015-12-21 DIAGNOSIS — M2560 Stiffness of unspecified joint, not elsewhere classified: Secondary | ICD-10-CM

## 2015-12-21 DIAGNOSIS — M791 Myalgia: Secondary | ICD-10-CM | POA: Diagnosis not present

## 2015-12-21 NOTE — Therapy (Signed)
[place], [place], [address] Phone: [phone]   Fax:  [phone]  Physical Therapy Treatment  Patient Details  Name: Colleen [name] MRN: [id number] Date of Birth: 03-12-1946 Referring Provider: Sherrie Mustache NP  Encounter Date: 12/21/2015      PT End of Session - 12/21/15 1227    Visit Number 3   Number of Visits 12   Date for PT Re-Evaluation 01/26/16   Authorization Type Medicare/    Pt has order for TENS and Iontophoresis   Authorization Time Period 01/26/16   PT Start Time 1227   PT Stop Time (p) 1315   PT Time Calculation (min) (p) 48 min   Activity Tolerance Patient tolerated treatment well   Behavior During Therapy [facility] for tasks assessed/performed      Past Medical History  Diagnosis Date  . Hypothyroidism   . Corns and callosities   . Urge incontinence   . Screening for diabetes mellitus   . Arthritis   . Obesity   . GERD (gastroesophageal reflux disease)   . Glaucoma   . Colon polyps     adenomatous  . Diverticulosis   . Anxiety   . Allergic rhinitis   . Hypercholesteremia     Past Surgical History  Procedure Laterality Date  . Abdominal hysterectomy  1985    Dr Mancel Bale  . Tonsillectomy  1951  . Thyroidectomy  1987    Dr Guadlupe Spanish  . Carpal tunnel release Bilateral 2010    Dr Laurance Flatten  . Bunionectomy Right     Dr Jomarie Longs  . Rotator cuff repair Right     Dr Theda Sers  . Cesarean section  1983  . Colonoscopy      There were no vitals filed for this visit.  Visit Diagnosis:  Left hip pain  Activity intolerance  Decreased ROM of trunk and back  Decreased ROM of lower extremity  Piriformis muscle pain      Subjective Assessment - 12/21/15 1230    Subjective I feel the pain most all the time. now the pain is shooting down to the left knee area.  I was out on Saturday walking off and on for 3 hours but by the time I got home I was hurting   How long can you sit  comfortably? 2-3 hours   How long can you stand comfortably? 30 minutes   How long can you walk comfortably? 30 minutres   Patient Stated Goals I just want my left hip/back to get better so I dont have to take pain medicine.     Pain Score 4    Pain Location Back  left piriformis and joint line   Pain Orientation Left   Pain Descriptors / Indicators Aching   Pain Type Chronic pain   Pain Onset More than a month ago   Pain Frequency Intermittent   Aggravating Factors  lifting leg into the tub                         [facility] Adult PT Treatment/Exercise - 12/21/15 1232    Self-Care   Self-Care --   Posture --   Lumbar Exercises: Stretches   Passive Hamstring Stretch 3 reps;30 seconds   Passive Hamstring Stretch Limitations left side with strap   Pelvic Tilt 5 reps   Lumbar Exercises: Supine   Clam --   Bridge 10 reps;5 seconds   Bridge Limitations VC for proper form  Knee/Hip Exercises: Stretches   Passive Hamstring Stretch 3 reps;30 seconds   Piriformis Stretch 30 seconds  3 reps   Piriformis Stretch Limitations feels stretch at end range pain   Knee/Hip Exercises: Sidelying   Clams 10 X 2 seet,  cues breathing, technique  with red theraband and quiet pelvis with hands on hip   Modalities   Modalities --   Moist Heat Therapy   Moist Heat Location --   Electrical Stimulation   Electrical Stimulation Location --   Electrical Stimulation Goals --   Iontophoresis   Type of Iontophoresis Dexamethasone   Location piriformis prox and distal inserticion   Dose 1cc patches    Time 4 hour before removing patch   Manual Therapy   Manual Therapy Soft tissue mobilization   Soft tissue mobilization  piriformis followed by passive stretch,  gentle          Trigger Point Dry Needling - 12/21/15 1244    Consent Given? Yes   Education Handout Provided No  Previously given   Muscles Treated Lower Body Gluteus minimus;Gluteus maximus;Piriformis  left side only    Gluteus Maximus Response Twitch response elicited;Palpable increased muscle length   Gluteus Minimus Response Twitch response elicited;Palpable increased muscle length   Piriformis Response Twitch response elicited;Palpable increased muscle length              PT Education - 12/21/15 1253    Education provided Yes   Education Details added to HEP and Iontophoresis home instructions   Person(s) Educated Patient   Methods Explanation;Demonstration;Verbal cues;Tactile cues;Handout   Comprehension Verbalized understanding;Returned demonstration          PT Short Term Goals - 12/21/15 1315    PT SHORT TERM GOAL #1   Title "Independent with initial HEP 01-05-16   Time 3   Period Weeks   Status On-going   PT SHORT TERM GOAL #2   Title "Report pain decrease from  9 /10 to  4 /10  01-05-16   Baseline 4/10 today   Period Weeks   Status Achieved   PT SHORT TERM GOAL #3   Title "Demonstrate understanding of proper sitting posture and be more conscious of position and posture throughout the day.  01-05-16   Time 3   Period Weeks   Status On-going           PT Long Term Goals - 12/21/15 1316    PT LONG TERM GOAL #1   Title "Demonstrate and verbalize techniques to reduce the risk of re-injury including: lifting, posture, body mechanics.    Time 6   Period Weeks   Status On-going   PT LONG TERM GOAL #2   Title "Pt will be independent with advanced HEP.    Time 6   Period Weeks   Status On-going   PT LONG TERM GOAL #3   Title "Pt will tolerate sitting for 1 hour without increased pain to ride in car without increased pain.   Time 6   Period Weeks   Status On-going   PT LONG TERM GOAL #4   Title "FOTO will improve from   59%  to   40%  indicating improved functional mobility .    Time 6   Period Weeks   Status On-going   PT LONG TERM GOAL #5   Title Pt will be able to return to exercise on stationary bike 4 x a week without increased pain in low back and left hip   Time  6    Period Weeks   Status On-going               Plan - 12/21/15 1245    Clinical Impression Statement Pt achieved 4/10 pain relief. Was able to walk around for 3 hours with frequent rests but caused spasm in left piriformis.  Pt achieved STG #2 but still needs core strengthening to alleviate left low back and hip pain.     Pt will benefit from skilled therapeutic intervention in order to improve on the following deficits Pain;Decreased mobility;Decreased activity tolerance;Decreased strength;Increased fascial restricitons;Decreased range of motion;Postural dysfunction;Improper body mechanics   Rehab Potential Excellent   PT Frequency 2x / week   PT Duration 6 weeks   PT Treatment/Interventions ADLs/Self Care Home Management;Electrical Stimulation;Iontophoresis 4mg /ml Dexamethasone;Passive range of motion;Dry needling;Taping;Manual techniques;Patient/family education;Neuromuscular re-education;Ultrasound;Functional mobility training;Therapeutic exercise;Moist Heat;Cryotherapy   PT Next Visit Plan Continue Core exericises.  Assess dry needling and iontophoresisi   PT Home Exercise Plan clamshell right sidelying with red t band and bridging added to HEP   Consulted and Agree with Plan of Care Patient        Problem List Patient Active Problem List   Diagnosis Date Noted  . Hiatal hernia with gastroesophageal reflux 12/23/2014  . Atypical chest pain 08/13/2014  . Abdominal pain, epigastric 08/13/2014  . Left-sided low back pain with left-sided sciatica 08/12/2014  . Helicobacter pylori gastritis 08/12/2014  . Helicobacter pylori (H. pylori) infection 07/15/2014  . Skin spots-aging 07/08/2014  . RLS (restless legs syndrome) 07/08/2014  . Chest pain 07/04/2014  . Heartburn 04/30/2014  . Headache(784.0) 04/30/2014  . Pain of right breast 04/30/2014  . Osteopenia 01/08/2014  . Restless leg syndrome 08/20/2013  . Unspecified constipation 08/20/2013  . Hypothyroidism 02/19/2013  .  Callus of heel 02/19/2013  . Need for prophylactic vaccination against Streptococcus pneumoniae (pneumococcus) 02/19/2013  . Routine general medical examination at a health care facility 02/19/2013  . Postmenopausal estrogen deficiency 02/19/2013  . H/O total hysterectomy 02/19/2013  . Disorder of bone and cartilage, unspecified 02/19/2013    Voncille Lo, PT 12/21/2015 1:20 PM Phone: [phone] Fax: [facility] [address] [place], [place], [address] Phone: [phone]   Fax:  [phone]  Name: Guiseppina Bandera MRN: [id number] Date of Birth: [birth year]

## 2015-12-21 NOTE — Patient Instructions (Signed)
IONTOPHORESIS PATIENT PRECAUTIONS & CONTRAINDICATIONS:  . Redness under one or both electrodes can occur.  This characterized by a uniform redness that usually disappears within 12 hours of treatment. . Small pinhead size blisters may result in response to the drug.  Contact your physician if the problem persists more than 24 hours. . On rare occasions, iontophoresis therapy can result in temporary skin reactions such as rash, inflammation, irritation or burns.  The skin reactions may be the result of individual sensitivity to the ionic solution used, the condition of the skin at the start of treatment, reaction to the materials in the electrodes, allergies or sensitivity to dexamethasone, or a poor connection between the patch and your skin.  Discontinue using iontophoresis if you have any of these reactions and report to your therapist. . Remove the Patch or electrodes if you have any undue sensation of pain or burning during the treatment and report discomfort to your therapist. . Tell your Therapist if you have had known adverse reactions to the application of electrical current. . If using the Patch, the LED light will turn off when treatment is complete and the patch can be removed.  Approximate treatment time is 1-3 hours.  Remove the patch when light goes off or after 6 hours. . The Patch can be worn during normal activity, however excessive motion where the electrodes have been placed can cause poor contact between the skin and the electrode or uneven electrical current resulting in greater risk of skin irritation. Marland Kitchen Keep out of the reach of children.   . DO NOT use if you have a cardiac pacemaker or any other electrically sensitive implanted device. . DO NOT use if you have a known sensitivity to dexamethasone. . DO NOT use during Magnetic Resonance Imaging (MRI). . DO NOT use over broken or compromised skin (e.g. sunburn, cuts, or acne) due to the increased risk of skin reaction. . DO  NOT SHAVE over the area to be treated:  To establish good contact between the Patch and the skin, excessive hair may be clipped. . DO NOT place the Patch or electrodes on or over your eyes, directly over your heart, or brain. . DO NOT reuse the Patch or electrodes as this may cause burns to occur.      HIP: Abduction / External Rotation (Band)   Place band around knees. Lie on side with hips and knees bent. Raise top knee up, squeezing glutes. Keep feet together. Hold _3-5__ seconds. Use ____red____ band. _10__ reps per set, _2__ sets per day, __7_ days per week  Keep your left hand on hip to keep from rotating. Keep hips quiet.   Bridge   Lie back, legs bent. Inhale, pressing hips up. Keeping ribs in, lengthen lower back. Exhale, rolling down along spine from top. Repeat __10 x 2__ times. Do _1___ sessions per day.   Colleen [name], PT 12/21/2015 12:53 PM Phone: [phone] Fax: [phone]

## 2015-12-24 ENCOUNTER — Ambulatory Visit: Payer: Medicare Other | Admitting: Physical Therapy

## 2015-12-24 DIAGNOSIS — M25552 Pain in left hip: Secondary | ICD-10-CM

## 2015-12-24 DIAGNOSIS — M25669 Stiffness of unspecified knee, not elsewhere classified: Secondary | ICD-10-CM

## 2015-12-24 DIAGNOSIS — R29898 Other symptoms and signs involving the musculoskeletal system: Secondary | ICD-10-CM | POA: Diagnosis not present

## 2015-12-24 DIAGNOSIS — M791 Myalgia: Secondary | ICD-10-CM | POA: Diagnosis not present

## 2015-12-24 DIAGNOSIS — R6889 Other general symptoms and signs: Secondary | ICD-10-CM | POA: Diagnosis not present

## 2015-12-24 DIAGNOSIS — M2569 Stiffness of other specified joint, not elsewhere classified: Secondary | ICD-10-CM

## 2015-12-24 DIAGNOSIS — M2560 Stiffness of unspecified joint, not elsewhere classified: Secondary | ICD-10-CM

## 2015-12-24 DIAGNOSIS — M7918 Myalgia, other site: Secondary | ICD-10-CM

## 2015-12-24 NOTE — Therapy (Signed)
[name], [place], [address] Phone: [phone]   Fax:  [phone]  Physical Therapy Treatment  Patient Details  Name: Colleen [name] MRN: [id number] Date of Birth: 06-Feb-1946 Referring Provider: Sherrie Mustache NP  Encounter Date: 12/24/2015      PT End of Session - 12/24/15 0842    Visit Number 4   Number of Visits 12   Date for PT Re-Evaluation 01/26/16   Authorization Type Medicare/    Pt has order for TENS and Iontophoresis   Authorization Time Period 01/26/16   PT Start Time 0844   PT Stop Time 0943   PT Time Calculation (min) 59 min   Activity Tolerance Patient tolerated treatment well   Behavior During Therapy [facility] for tasks assessed/performed      Past Medical History  Diagnosis Date  . Hypothyroidism   . Corns and callosities   . Urge incontinence   . Screening for diabetes mellitus   . Arthritis   . Obesity   . GERD (gastroesophageal reflux disease)   . Glaucoma   . Colon polyps     adenomatous  . Diverticulosis   . Anxiety   . Allergic rhinitis   . Hypercholesteremia     Past Surgical History  Procedure Laterality Date  . Abdominal hysterectomy  1985    Dr Mancel Bale  . Tonsillectomy  1951  . Thyroidectomy  1987    Dr Guadlupe Spanish  . Carpal tunnel release Bilateral 2010    Dr Laurance Flatten  . Bunionectomy Right     Dr Jomarie Longs  . Rotator cuff repair Right     Dr Theda Sers  . Cesarean section  1983  . Colonoscopy      There were no vitals filed for this visit.  Visit Diagnosis:  Left hip pain  Activity intolerance  Decreased ROM of trunk and back  Decreased ROM of lower extremity  Piriformis muscle pain      Subjective Assessment - 12/24/15 0843    Subjective I have no shooting pain down into my knee.  Now I only feel in in my low back and hip on left side.  I was able to get on my exericise bike yesterday for 20 minutes. I have been doing my sitting piriformis stretch    Limitations Sitting;Standing;Walking;House hold activities   How long can you sit comfortably? 2-3 hours   How long can you stand comfortably? 30 minutes   How long can you walk comfortably? 45 to 1 hour   Patient Stated Goals I just want my left hip/back to get better so I dont have to take pain medicine.     Currently in Pain? Yes   Pain Score 2    Pain Location Back   Pain Orientation Left   Pain Descriptors / Indicators Aching   Pain Type Chronic pain   Pain Onset More than a month ago   Pain Frequency Intermittent                         [facility] Adult PT Treatment/Exercise - 12/24/15 0850    Knee/Hip Exercises: Stretches   Piriformis Stretch 30 seconds;2 reps  sitting   Piriformis Stretch Limitations corrected for proper technique   Knee/Hip Exercises: Standing   Other Standing Knee Exercises 4 way SLR with green t band in doorway sitting for initial putting on t band flex, abd and add and ext left and right VC and TC required  Knee/Hip Exercises: Supine   Bridges Limitations Bridging 10 x 2    Bridges with Diona Foley Squeeze 1 set;10 reps;Both;Strengthening   Single Leg Bridge Other (comment)  unable to execute attempted x 3   Knee/Hip Exercises: Sidelying   Clams 10 X 2 seet,  cues breathing, technique  with red theraband and quiet pelvis with hands on hip   Modalities   Modalities Moist Heat   Moist Heat Therapy   Number Minutes Moist Heat 15 Minutes   Moist Heat Location Hip  left   Manual Therapy   Manual Therapy Soft tissue mobilization   Soft tissue mobilization  piriformis followed by passive stretch,  gentle use of IASTYM for sacral scrubbing on left          Trigger Point Dry Needling - 12/24/15 0918    Consent Given? Yes   Education Handout Provided No  previously given   Muscles Treated Lower Body Gluteus minimus;Gluteus maximus;Piriformis  left side only   Gluteus Maximus Response Palpable increased muscle length   Gluteus Minimus  Response Palpable increased muscle length   Piriformis Response Twitch response elicited;Palpable increased muscle length              PT Education - 12/24/15 0916    Education provided Yes   Education Details Reviewed HEP and added 4 way standing SLR with green tband   Person(s) Educated Patient   Methods Explanation;Demonstration;Verbal cues;Handout;Tactile cues   Comprehension Verbalized understanding;Returned demonstration          PT Short Term Goals - 12/21/15 1315    PT SHORT TERM GOAL #1   Title "Independent with initial HEP 01-05-16   Time 3   Period Weeks   Status On-going   PT SHORT TERM GOAL #2   Title "Report pain decrease from  9 /10 to  4 /10  01-05-16   Baseline 4/10 today   Period Weeks   Status Achieved   PT SHORT TERM GOAL #3   Title "Demonstrate understanding of proper sitting posture and be more conscious of position and posture throughout the day.  01-05-16   Time 3   Period Weeks   Status On-going           PT Long Term Goals - 12/21/15 1316    PT LONG TERM GOAL #1   Title "Demonstrate and verbalize techniques to reduce the risk of re-injury including: lifting, posture, body mechanics.    Time 6   Period Weeks   Status On-going   PT LONG TERM GOAL #2   Title "Pt will be independent with advanced HEP.    Time 6   Period Weeks   Status On-going   PT LONG TERM GOAL #3   Title "Pt will tolerate sitting for 1 hour without increased pain to ride in car without increased pain.   Time 6   Period Weeks   Status On-going   PT LONG TERM GOAL #4   Title "FOTO will improve from   59%  to   40%  indicating improved functional mobility .    Time 6   Period Weeks   Status On-going   PT LONG TERM GOAL #5   Title Pt will be able to return to exercise on stationary bike 4 x a week without increased pain in low back and left hip   Time 6   Period Weeks   Status On-going               Problem  List Patient Active Problem List   Diagnosis  Date Noted  . Hiatal hernia with gastroesophageal reflux 12/23/2014  . Atypical chest pain 08/13/2014  . Abdominal pain, epigastric 08/13/2014  . Left-sided low back pain with left-sided sciatica 08/12/2014  . Helicobacter pylori gastritis 08/12/2014  . Helicobacter pylori (H. pylori) infection 07/15/2014  . Skin spots-aging 07/08/2014  . RLS (restless legs syndrome) 07/08/2014  . Chest pain 07/04/2014  . Heartburn 04/30/2014  . Headache(784.0) 04/30/2014  . Pain of right breast 04/30/2014  . Osteopenia 01/08/2014  . Restless leg syndrome 08/20/2013  . Unspecified constipation 08/20/2013  . Hypothyroidism 02/19/2013  . Callus of heel 02/19/2013  . Need for prophylactic vaccination against Streptococcus pneumoniae (pneumococcus) 02/19/2013  . Routine general medical examination at a health care facility 02/19/2013  . Postmenopausal estrogen deficiency 02/19/2013  . H/O total hysterectomy 02/19/2013  . Disorder of bone and cartilage, unspecified 02/19/2013    Voncille Lo, PT 12/24/2015 12:49 PM Phone: [phone] Fax: [facility] [address] [place], [place], [address] Phone: [phone]   Fax:  [phone]  Name: Laylanie Hofacker MRN: [id number] Date of Birth: 1946/06/25

## 2015-12-24 NOTE — Patient Instructions (Signed)
   Do exercises with right and then left leg.  Concentrate on left.  Hold all exercises for 3 -5 seconds.  Place theraband in doorway and put on leg while sitting on chair and remove by sitting in chair. For safety.  Copyright  VHI. All rights reserved.  Backward Bend (Standing)   Knee High   Holding stable object, raise knee to hip level, then lower knee. Repeat with other knee. Complete __10_ repetitions. Do __2__ sessions per day. Keep leg straight and not bend and use green theraband  ABDUCTION: Standing (Active)   Stand, feet flat. Lift right leg out to side. Use _0__ lbs. Complete __10_ repetitions. Perform __2_ sessions per day. Use red theraband and then green ADDUCTION: Standing - Stable (Active)   Stand, right leg out to side as far as possible. Draw leg in across midline. Use red and then green theraband Complete 10_ repetitions. Perform _2__ sessions per day. Lead with heel       EXTENSION: Standing (Active)  Stand, both feet flat. Draw right leg behind body as far as possible. Use red and then green theraband Complete 10 repetitions. Perform __2_ sessions per day.  Copyright  VHI. All rights reserved.   Voncille Lo, PT 12/24/2015 9:05 AM Phone: [phone] Fax: [phone]

## 2015-12-28 ENCOUNTER — Ambulatory Visit: Payer: Medicare Other | Admitting: Physical Therapy

## 2015-12-28 DIAGNOSIS — M25552 Pain in left hip: Secondary | ICD-10-CM

## 2015-12-28 DIAGNOSIS — R6889 Other general symptoms and signs: Secondary | ICD-10-CM

## 2015-12-28 DIAGNOSIS — M791 Myalgia: Secondary | ICD-10-CM | POA: Diagnosis not present

## 2015-12-28 DIAGNOSIS — M7918 Myalgia, other site: Secondary | ICD-10-CM

## 2015-12-28 DIAGNOSIS — M2560 Stiffness of unspecified joint, not elsewhere classified: Secondary | ICD-10-CM

## 2015-12-28 DIAGNOSIS — R29898 Other symptoms and signs involving the musculoskeletal system: Secondary | ICD-10-CM | POA: Diagnosis not present

## 2015-12-28 DIAGNOSIS — M25669 Stiffness of unspecified knee, not elsewhere classified: Secondary | ICD-10-CM

## 2015-12-28 DIAGNOSIS — M2569 Stiffness of other specified joint, not elsewhere classified: Secondary | ICD-10-CM

## 2015-12-28 NOTE — Therapy (Signed)
[place], [place], [address] Phone: [phone]   Fax:  [phone]  Physical Therapy Treatment  Patient Details  Name: Colleen [name] MRN: [id number] Date of Birth: 04-Sep-1946 Referring Provider: Sherrie Mustache NP  Encounter Date: 12/28/2015      PT End of Session - 12/28/15 1351    Visit Number 5   Number of Visits 12   Date for PT Re-Evaluation 01/26/16   PT Start Time 0845   PT Stop Time 0935   PT Time Calculation (min) 50 min   Activity Tolerance Patient tolerated treatment well   Behavior During Therapy [facility] for tasks assessed/performed      Past Medical History  Diagnosis Date  . Hypothyroidism   . Corns and callosities   . Urge incontinence   . Screening for diabetes mellitus   . Arthritis   . Obesity   . GERD (gastroesophageal reflux disease)   . Glaucoma   . Colon polyps     adenomatous  . Diverticulosis   . Anxiety   . Allergic rhinitis   . Hypercholesteremia     Past Surgical History  Procedure Laterality Date  . Abdominal hysterectomy  1985    Dr Mancel Bale  . Tonsillectomy  1951  . Thyroidectomy  1987    Dr Guadlupe Spanish  . Carpal tunnel release Bilateral 2010    Dr Laurance Flatten  . Bunionectomy Right     Dr Jomarie Longs  . Rotator cuff repair Right     Dr Theda Sers  . Cesarean section  1983  . Colonoscopy      There were no vitals filed for this visit.  Visit Diagnosis:  Left hip pain  Activity intolerance  Decreased ROM of trunk and back  Decreased ROM of lower extremity  Piriformis muscle pain      Subjective Assessment - 12/28/15 0855    Subjective Dry needle helped.  It hurts all the time but it feels like it is easing.  I had no problems with the patch.  I did not get up this morning with pain.    Currently in Pain? Yes   Pain Score --  mild,  she did not give number   Pain Location Back   Pain Orientation Left   Pain Descriptors / Indicators Aching   Pain  Radiating Towards leg, tho not now   Pain Frequency Intermittent   Aggravating Factors  lifting  leg into tub,  bending things off floor   Pain Relieving Factors PT, meds                         [facility] Adult PT Treatment/Exercise - 12/28/15 0911    Lumbar Exercises: Stretches   Passive Hamstring Stretch --   Passive Hamstring Stretch Limitations --   Lumbar Exercises: Supine   Clam 10 reps   Bridge 10 reps   Bridge Limitations Bridge with bent knee raise 10 X 2 sets altermating.    Straight Leg Raise 5 reps  2 sets, cues, difficult   Knee/Hip Exercises: Stretches   Passive Hamstring Stretch 3 reps;30 seconds  both   Piriformis Stretch 3 reps;30 seconds   Piriformis Stretch Limitations 2 different ways to stretch   Knee/Hip Exercises: Standing   Other Standing Knee Exercises 2 way red band 10 X 2sets cues needed,  assist to don band.  red band issued at patient's request   Knee/Hip Exercises: Sidelying   Hip ABduction 10 reps;2  sets  2 sets each   Clams 20 X red band   Iontophoresis   Type of Iontophoresis Dexamethasone   Location piriformis,distal   Dose 1cc od 4 mg.ml   Time 4 hoiurs.                  PT Short Term Goals - 12/28/15 1354    PT SHORT TERM GOAL #1   Title "Independent with initial HEP 01-05-16   Baseline minor cues   Time 3   Period Weeks   Status On-going   PT SHORT TERM GOAL #2   Title "Report pain decrease from  9 /10 to  4 /10  01-05-16   Time 3   Period Weeks   Status Achieved   PT SHORT TERM GOAL #3   Baseline She is working on it more frequently   Time 3   Period Weeks   Status On-going           PT Long Term Goals - 12/21/15 1316    PT LONG TERM GOAL #1   Title "Demonstrate and verbalize techniques to reduce the risk of re-injury including: lifting, posture, body mechanics.    Time 6   Period Weeks   Status On-going   PT LONG TERM GOAL #2   Title "Pt will be independent with advanced HEP.    Time 6    Period Weeks   Status On-going   PT LONG TERM GOAL #3   Title "Pt will tolerate sitting for 1 hour without increased pain to ride in car without increased pain.   Time 6   Period Weeks   Status On-going   PT LONG TERM GOAL #4   Title "FOTO will improve from   59%  to   40%  indicating improved functional mobility .    Time 6   Period Weeks   Status On-going   PT LONG TERM GOAL #5   Title Pt will be able to return to exercise on stationary bike 4 x a week without increased pain in low back and left hip   Time 6   Period Weeks   Status On-going               Plan - 12/28/15 1352    Clinical Impression Statement Pain continues to improve, tho still is constant.     PT Next Visit Plan continue core exercises, continue iontophoresis,     PT Home Exercise Plan continue strengthening with band   Consulted and Agree with Plan of Care Patient        Problem List Patient Active Problem List   Diagnosis Date Noted  . Hiatal hernia with gastroesophageal reflux 12/23/2014  . Atypical chest pain 08/13/2014  . Abdominal pain, epigastric 08/13/2014  . Left-sided low back pain with left-sided sciatica 08/12/2014  . Helicobacter pylori gastritis 08/12/2014  . Helicobacter pylori (H. pylori) infection 07/15/2014  . Skin spots-aging 07/08/2014  . RLS (restless legs syndrome) 07/08/2014  . Chest pain 07/04/2014  . Heartburn 04/30/2014  . Headache(784.0) 04/30/2014  . Pain of right breast 04/30/2014  . Osteopenia 01/08/2014  . Restless leg syndrome 08/20/2013  . Unspecified constipation 08/20/2013  . Hypothyroidism 02/19/2013  . Callus of heel 02/19/2013  . Need for prophylactic vaccination against Streptococcus pneumoniae (pneumococcus) 02/19/2013  . Routine general medical examination at a health care facility 02/19/2013  . Postmenopausal estrogen deficiency 02/19/2013  . H/O total hysterectomy 02/19/2013  . Disorder of bone and cartilage, unspecified  02/19/2013     HARRIS,KAREN 12/28/2015, 1:59 PM  [facility] [address] [place], [place], [address] Phone: [phone]   Fax:  [phone]  Name: Otta Osipov MRN: [id number] Date of Birth: [birth year]    Melvenia Needles, PTA 12/28/2015 1:59 PM Phone: [phone] Fax: [phone]

## 2015-12-29 ENCOUNTER — Ambulatory Visit
Admission: RE | Admit: 2015-12-29 | Discharge: 2015-12-29 | Disposition: A | Discharge status: Home / Self Care | Payer: Medicare Other | Source: Ambulatory Visit

## 2015-12-29 DIAGNOSIS — Z1231 Encounter for screening mammogram for malignant neoplasm of breast: Secondary | ICD-10-CM | POA: Diagnosis not present

## 2015-12-31 ENCOUNTER — Ambulatory Visit (INDEPENDENT_AMBULATORY_CARE_PROVIDER_SITE_OTHER): Payer: Medicare Other | Admitting: Nurse Practitioner

## 2015-12-31 ENCOUNTER — Encounter: Payer: Self-pay | Admitting: Nurse Practitioner

## 2015-12-31 ENCOUNTER — Ambulatory Visit: Payer: Medicare Other | Attending: Nurse Practitioner | Admitting: Physical Therapy

## 2015-12-31 VITALS — BP 142/78 | HR 52 | Temp 97.9°F | Resp 20 | Ht 61.0 in | Wt 185.4 lb

## 2015-12-31 DIAGNOSIS — R6889 Other general symptoms and signs: Secondary | ICD-10-CM | POA: Diagnosis not present

## 2015-12-31 DIAGNOSIS — K219 Gastro-esophageal reflux disease without esophagitis: Secondary | ICD-10-CM

## 2015-12-31 DIAGNOSIS — M7918 Myalgia, other site: Secondary | ICD-10-CM

## 2015-12-31 DIAGNOSIS — M791 Myalgia: Secondary | ICD-10-CM | POA: Insufficient documentation

## 2015-12-31 DIAGNOSIS — M2569 Stiffness of other specified joint, not elsewhere classified: Secondary | ICD-10-CM

## 2015-12-31 DIAGNOSIS — M25552 Pain in left hip: Secondary | ICD-10-CM | POA: Insufficient documentation

## 2015-12-31 DIAGNOSIS — M545 Low back pain, unspecified: Secondary | ICD-10-CM

## 2015-12-31 DIAGNOSIS — R29898 Other symptoms and signs involving the musculoskeletal system: Secondary | ICD-10-CM | POA: Insufficient documentation

## 2015-12-31 DIAGNOSIS — M25669 Stiffness of unspecified knee, not elsewhere classified: Secondary | ICD-10-CM

## 2015-12-31 DIAGNOSIS — M2560 Stiffness of unspecified joint, not elsewhere classified: Secondary | ICD-10-CM

## 2015-12-31 NOTE — Progress Notes (Signed)
Patient ID: Quinnlee Willet, female   DOB: 11-09-1945, 70 y.o.   MRN: [id number]    PCP: [name], NP  Advanced Directive information Does patient have an advance directive?: Yes, Type of Advance Directive: [place], Does patient want to make changes to advanced directive?: No - Patient declined  No Known Allergies  Chief Complaint  Patient presents with  . Follow-up    back pain and GERD     HPI: Patient is a 70 y.o. female seen in the office today to follow up shoulder pain and back pain. At last visit pt with complaints of lumbar spine pain. Xray was ordered but not obtained. Tramadol ordered as well as PT. Reports physical therapy has helped a lot.   Pt also with complaints of GERD. Discussed lifestyle modifications with changes in food, to sit upright etc and Protonix was started. Symptoms have improved.    Review of Systems:  Review of Systems  Constitutional: Negative for activity change, appetite change, fatigue and unexpected weight change.  HENT: Negative for congestion and hearing loss.   Eyes: Negative.   Respiratory: Negative for cough and shortness of breath.   Cardiovascular: Negative for chest pain, palpitations and leg swelling.  Gastrointestinal: Negative for abdominal pain, diarrhea and constipation.  Genitourinary: Negative for dysuria and difficulty urinating.  Musculoskeletal: Positive for myalgias and arthralgias.       Still having pain but nothing like what it was  Skin: Negative for color change and wound.  Neurological: Negative for dizziness and weakness.  Psychiatric/Behavioral: Negative for behavioral problems, confusion and agitation.  All other systems reviewed and are negative.   Past Medical History  Diagnosis Date  . Hypothyroidism   . Corns and callosities   . Urge incontinence   . Screening for diabetes mellitus   . Arthritis   . Obesity   . GERD (gastroesophageal reflux disease)   . Glaucoma   . Colon  polyps     adenomatous  . Diverticulosis   . Anxiety   . Allergic rhinitis   . Hypercholesteremia    Past Surgical History  Procedure Laterality Date  . Abdominal hysterectomy  1985    Dr Mancel Bale  . Tonsillectomy  1951  . Thyroidectomy  1987    Dr Guadlupe Spanish  . Carpal tunnel release Bilateral 2010    Dr Laurance Flatten  . Bunionectomy Right     Dr Jomarie Longs  . Rotator cuff repair Right     Dr Theda Sers  . Cesarean section  1983  . Colonoscopy     Social History:   reports that she quit smoking about 29 years ago. Her smoking use included Cigarettes. She has never used smokeless tobacco. She reports that she does not drink alcohol or use illicit drugs.  Family History  Problem Relation Age of Onset  . Leukemia Mother 23  . Lung cancer Father   . Diabetes Brother   . COPD Brother   . Lung cancer Brother   . Diabetes Maternal Grandmother   . Cancer Maternal Grandfather     unknown type  . Colon cancer Mother     Medications: Patient's Medications  New Prescriptions   No medications on file  Previous Medications   ACETAMINOPHEN (TYLENOL) 500 MG TABLET    Take 1,000 mg by mouth every 6 (six) hours as needed for mild pain.   ACIDOPHILUS (RISAQUAD) CAPS CAPSULE    Take 1 capsule by mouth daily.   ASCORBIC ACID (VITAMIN C PO)  Take 1 tablet by mouth daily.   ASPIRIN 81 MG TABLET    Take 81 mg by mouth daily.   CALCIUM-VITAMIN D PO    Take 2 tablets by mouth daily.    LEVOTHYROXINE (SYNTHROID, LEVOTHROID) 50 MCG TABLET    Take 1 tablet (50 mcg total) by mouth daily.   MULTIPLE VITAMIN (MULTIVITAMIN WITH MINERALS) TABS    Take 1 tablet by mouth daily.   NITROGLYCERIN (NITROSTAT) 0.4 MG SL TABLET    Place 1 tablet (0.4 mg total) under the tongue every 5 (five) minutes x 3 doses as needed for chest pain.   OMEGA-3 FATTY ACIDS (OMEGA 3 PO)    Take 1 gel cap daily   PANTOPRAZOLE (PROTONIX) 40 MG TABLET    Take 1 tablet (40 mg total) by mouth daily.   RANITIDINE (ZANTAC) 150 MG TABLET    Take  150 mg by mouth 2 (two) times daily.   ROPINIROLE (REQUIP) 0.25 MG TABLET    TAKE 1 TABLET BY MOUTH ONCE DAILY AFTER SUPPER. (TAKE 1-2 HOURS BEFORE GOING TO BED)   SIMETHICONE (GAS RELIEF) 125 MG CAPS    Take 1 each by mouth 4 (four) times daily. As needed for gas relief   TDAP (BOOSTRIX) 5-2.5-18.5 LF-MCG/0.5 INJECTION    Inject 0.5 mLs into the muscle once.   TRAMADOL (ULTRAM) 50 MG TABLET    Take 1 tablet (50 mg total) by mouth every 6 (six) hours as needed.  Modified Medications   No medications on file  Discontinued Medications   No medications on file     Physical Exam:  Filed Vitals:   12/31/15 1013  BP: 142/78  Pulse: 52  Temp: 97.9 F (36.6 C)  TempSrc: Oral  Resp: 20  Height: 5\' 1"  (1.549 m)  Weight: 185 lb 6.4 oz (84.097 kg)  SpO2: 96%   Body mass index is 35.05 kg/(m^2).  Physical Exam  Constitutional: She is oriented to person, place, and time. She appears well-developed and well-nourished. No distress.  Cardiovascular: Normal rate, regular rhythm and normal heart sounds.   Pulmonary/Chest: Effort normal and breath sounds normal.  Abdominal: Soft. Bowel sounds are normal. She exhibits no distension.  Musculoskeletal: Normal range of motion. She exhibits no edema.  Neurological: She is alert and oriented to person, place, and time.  Skin: Skin is warm and dry. She is not diaphoretic.  Psychiatric: She has a normal mood and affect.    Labs reviewed: Basic Metabolic Panel:  Recent Labs  01/07/15 1521 10/19/15 0806  NA 141 145*  K 3.7 3.9  CL 107 106  CO2 30 23  GLUCOSE 95 85  BUN 18 14  CREATININE 0.93 1.07*  CALCIUM 10.4 9.4  TSH  --  0.758   Liver Function Tests:  Recent Labs  01/07/15 1521 10/19/15 0806  AST 25 23  ALT 12 10  ALKPHOS 108 89  BILITOT 0.4 0.4  PROT 7.5 6.1  ALBUMIN 4.3 3.8    Recent Labs  01/07/15 1521  LIPASE 77.0*   No results for input(s): AMMONIA in the last 8760 hours. CBC:  Recent Labs  01/07/15 1521  10/19/15 0806  WBC 4.9 3.5  NEUTROABS 2.1 1.3*  HGB 12.3  --   HCT 37.4 35.5  MCV 92.1 92  PLT 246.0 242   Lipid Panel:  Recent Labs  10/19/15 0806  CHOL 206*  HDL 91  LDLCALC 105*  TRIG 50  CHOLHDL 2.3   TSH:  Recent Labs  10/19/15 0806  TSH 0.758   A1C: Lab Results  Component Value Date   HGBA1C 5.6 07/05/2014     Assessment/Plan  1. Gastroesophageal reflux disease, esophagitis presence not specified -improved on protonix. Will cont at this time  2. Left-sided low back pain without sciatica -improved with therapy. Pt rarely needing tramadol. To cont therapy and exercises prescribed.      Carlos American. Harle Battiest  [facility] [phone] 8 am - 5 pm) [phone] (after hours)

## 2015-12-31 NOTE — Patient Instructions (Signed)
Cont exercises per therapy Tramadol as needed for pain  Cont Protonix for acid reflux   Keep scheduled follow up

## 2015-12-31 NOTE — Therapy (Addendum)
[place], [place], [address] Phone: [phone]   Fax:  [phone]  Physical Therapy Treatment/Progress Note  Patient Details  Name: Colleen [name] MRN: [id number] Date of Birth: 12-11-1945 Referring Provider: Sherrie Mustache NP  Encounter Date: 12/31/2015      PT End of Session - 12/31/15 0900    Visit Number 6   Number of Visits 12   Date for PT Re-Evaluation 01/26/16   Authorization Type Medicare/    Pt has order for TENS and Iontophoresis   Authorization Time Period 01/26/16   PT Start Time 0845   PT Stop Time 0942   PT Time Calculation (min) 57 min   Activity Tolerance Patient tolerated treatment well   Behavior During Therapy [facility] for tasks assessed/performed      Past Medical History  Diagnosis Date  . Hypothyroidism   . Corns and callosities   . Urge incontinence   . Screening for diabetes mellitus   . Arthritis   . Obesity   . GERD (gastroesophageal reflux disease)   . Glaucoma   . Colon polyps     adenomatous  . Diverticulosis   . Anxiety   . Allergic rhinitis   . Hypercholesteremia     Past Surgical History  Procedure Laterality Date  . Abdominal hysterectomy  1985    Dr Mancel Bale  . Tonsillectomy  1951  . Thyroidectomy  1987    Dr Guadlupe Spanish  . Carpal tunnel release Bilateral 2010    Dr Laurance Flatten  . Bunionectomy Right     Dr Jomarie Longs  . Rotator cuff repair Right     Dr Theda Sers  . Cesarean section  1983  . Colonoscopy      There were no vitals filed for this visit.  Visit Diagnosis:  Left hip pain  Activity intolerance  Decreased ROM of trunk and back  Decreased ROM of lower extremity  Piriformis muscle pain      Subjective Assessment - 12/31/15 0848    Subjective So much better than it was, It feels like it is easing. I love the patch.  I haven't needed a pain pill in a week.    Limitations Sitting;Standing;Walking;House hold activities   How long can you sit  comfortably? 2-3 hours   How long can you stand comfortably? 30 minutes   How long can you walk comfortably? 45 to 1 hour   Patient Stated Goals I just want my left hip/back to get better so I dont have to take pain medicine.     Currently in Pain? Yes   Pain Score 2    Pain Location Back            [facility] PT Assessment - 12/31/15 0001    AROM   Left Hip Flexion 110  no pain   Left Hip Internal Rotation  24   no pain    Lumbar Flexion 90   Lumbar Extension 28  no pain   Lumbar - Right Side Bend 25   Lumbar - Left Side Bend 26   Lumbar - Right Rotation 90%   Lumbar - Left Rotation 90%   Palpation   Palpation comment decreased tendernesss on palpation on left side                     [facility] Adult PT Treatment/Exercise - 12/31/15 0852    Lumbar Exercises: Stretches   Passive Hamstring Stretch 3 reps;30 seconds   Passive Hamstring Stretch  Limitations left side with strap   Pelvic Tilt 5 reps   Lumbar Exercises: Supine   Clam 10 reps   Bridge 10 reps   Bridge Limitations Bridge with bent knee raise 10 X 2 sets altermating.    Straight Leg Raise 10 reps  1 set VC to slow down    Other Supine Lumbar Exercises single limb bridge x 10 on right and left with VC   Knee/Hip Exercises: Stretches   Passive Hamstring Stretch 3 reps;30 seconds  both   Passive Hamstring Stretch Limitations using green strap bil   Piriformis Stretch 3 reps;30 seconds   Piriformis Stretch Limitations 2 different ways to stretch   Knee/Hip Exercises: Standing   Other Standing Knee Exercises 2 way red band 10 X 2sets cues needed,  for proper alignment  VC for slowing down   Other Standing Knee Exercises sit to stand x 10 with VC to slow down   Knee/Hip Exercises: Sidelying   Hip ABduction --   Clams 20 X red band   Moist Heat Therapy   Number Minutes Moist Heat 10 Minutes   Moist Heat Location Hip  left   Iontophoresis   Type of Iontophoresis Dexamethasone   Location  piriformis,distal   Dose 1cc od 4 mg.ml   Time 4 hoiurs.                  PT Short Term Goals - 12/31/15 0856    PT SHORT TERM GOAL #1   Title "Independent with initial HEP 01-05-16   Time 3   Period Weeks   Status Achieved   PT SHORT TERM GOAL #2   Title "Report pain decrease from  9 /10 to  4 /10  01-05-16   Baseline 2/10 for pain   Time 3   Period Weeks   Status Achieved   PT SHORT TERM GOAL #3   Title "Demonstrate understanding of proper sitting posture and be more conscious of position and posture throughout the day.  01-05-16   Baseline Pt is more aware of posture and is able to verbalize 3 strategies   Time 3   Period Weeks   Status Achieved           PT Long Term Goals - 12/31/15 0857    PT LONG TERM GOAL #1   Title "Demonstrate and verbalize techniques to reduce the risk of re-injury including: lifting, posture, body mechanics.    Time 6   Period Weeks   Status On-going   PT LONG TERM GOAL #2   Title "Pt will be independent with advanced HEP.    Time 6   Period Weeks   Status On-going   PT LONG TERM GOAL #3   Title "Pt will tolerate sitting for 1 hour without increased pain to ride in car without increased pain.   Baseline Pt states no problems sittng for at least an hour   Time 6   Period Weeks   Status Achieved   PT LONG TERM GOAL #4   Title "FOTO will improve from   59%  to   40%  indicating improved functional mobility .    Time 6   Period Weeks   Status On-going   PT LONG TERM GOAL #5   Title Pt will be able to return to exercise on stationary bike 4 x a week without increased pain in low back and left hip   Baseline Pt is able to ride stationary bike 4 x a  week without increased pain as of 12-31-15   Time 6   Period Weeks   Status Achieved               Plan - 12/31/15 0907    Clinical Impression Statement Pt has achieved all STG's and 2 LTG's (#3 and # 5) . Pt also has improved lumbar AROM and hip AROM to end range without pain.  Pt initially recieved trigger point dry needling which was beneficial but she no longer has sharp trigger point pain.  Use of iontophoresis over SI joint line and glutealsl./piriformis has been beneficial as well as doing core exericise Pt has no pain riding in the car and is now able to return to exericise on stationary bike 4 x a week.  She reports that she has not had any pain pills in one week.  Pt is independent with intial HEP and  will be ready for finalizing Advanced HEP next week and DC after next visit as long as she continues making progress and achieving goals..  Pt requires minimal cuing to execute exericises correctly .  Pt beginning to self correct appropriately with reinforcement   Pt will benefit from skilled therapeutic intervention in order to improve on the following deficits Pain;Decreased mobility;Decreased activity tolerance;Decreased strength;Increased fascial restricitons;Decreased range of motion;Postural dysfunction;Improper body mechanics   Rehab Potential Excellent   PT Duration 6 weeks   PT Treatment/Interventions ADLs/Self Care Home Management;Electrical Stimulation;Iontophoresis 4mg /ml Dexamethasone;Passive range of motion;Dry needling;Taping;Manual techniques;Patient/family education;Neuromuscular re-education;Ultrasound;Functional mobility training;Therapeutic exercise;Moist Heat;Cryotherapy   PT Next Visit Plan Possible DC and FOTO for last visit if goals achieved   PT Home Exercise Plan continue Core and strengtheing with band        Problem List Patient Active Problem List   Diagnosis Date Noted  . Hiatal hernia with gastroesophageal reflux 12/23/2014  . Atypical chest pain 08/13/2014  . Abdominal pain, epigastric 08/13/2014  . Left-sided low back pain with left-sided sciatica 08/12/2014  . Helicobacter pylori gastritis 08/12/2014  . Helicobacter pylori (H. pylori) infection 07/15/2014  . Skin spots-aging 07/08/2014  . RLS (restless legs syndrome) 07/08/2014   . Chest pain 07/04/2014  . Heartburn 04/30/2014  . Headache(784.0) 04/30/2014  . Pain of right breast 04/30/2014  . Osteopenia 01/08/2014  . Restless leg syndrome 08/20/2013  . Unspecified constipation 08/20/2013  . Hypothyroidism 02/19/2013  . Callus of heel 02/19/2013  . Need for prophylactic vaccination against Streptococcus pneumoniae (pneumococcus) 02/19/2013  . Routine general medical examination at a health care facility 02/19/2013  . Postmenopausal estrogen deficiency 02/19/2013  . H/O total hysterectomy 02/19/2013  . Disorder of bone and cartilage, unspecified 02/19/2013    Voncille Lo, PT 12/31/2015 9:31 AM Phone: [phone] Fax: [facility] [address] [place], [place], [address] Phone: [phone]   Fax:  [phone]  Name: Haizley Leiker MRN: [id number] Date of Birth: [birth year]

## 2016-01-04 ENCOUNTER — Encounter: Payer: Self-pay | Admitting: Physical Therapy

## 2016-01-07 ENCOUNTER — Ambulatory Visit: Payer: Medicare Other | Admitting: Physical Therapy

## 2016-01-07 DIAGNOSIS — M2569 Stiffness of other specified joint, not elsewhere classified: Secondary | ICD-10-CM

## 2016-01-07 DIAGNOSIS — M7918 Myalgia, other site: Secondary | ICD-10-CM

## 2016-01-07 DIAGNOSIS — M25669 Stiffness of unspecified knee, not elsewhere classified: Secondary | ICD-10-CM

## 2016-01-07 DIAGNOSIS — M2560 Stiffness of unspecified joint, not elsewhere classified: Secondary | ICD-10-CM

## 2016-01-07 DIAGNOSIS — M25552 Pain in left hip: Secondary | ICD-10-CM | POA: Diagnosis not present

## 2016-01-07 DIAGNOSIS — R6889 Other general symptoms and signs: Secondary | ICD-10-CM | POA: Diagnosis not present

## 2016-01-07 DIAGNOSIS — R29898 Other symptoms and signs involving the musculoskeletal system: Secondary | ICD-10-CM | POA: Diagnosis not present

## 2016-01-07 DIAGNOSIS — M791 Myalgia: Secondary | ICD-10-CM | POA: Diagnosis not present

## 2016-01-07 NOTE — Therapy (Signed)
[place], [place], [address] Phone: [phone]   Fax:  [phone]  Physical Therapy Treatment/Discharge Note  Patient Details  Name: Colleen [name] MRN: [id number] Date of Birth: 17-Mar-1946 Referring Provider: Sherrie Mustache NP  Encounter Date: 01/07/2016      PT End of Session - 01/07/16 0912    Visit Number 7   Number of Visits 12   Date for PT Re-Evaluation 01/26/16   Authorization Type Medicare/    Pt has order for TENS and Iontophoresis,  Progress note sent 12-31-15   Authorization Time Period 01/26/16   PT Start Time 0840      Past Medical History  Diagnosis Date  . Hypothyroidism   . Corns and callosities   . Urge incontinence   . Screening for diabetes mellitus   . Arthritis   . Obesity   . GERD (gastroesophageal reflux disease)   . Glaucoma   . Colon polyps     adenomatous  . Diverticulosis   . Anxiety   . Allergic rhinitis   . Hypercholesteremia     Past Surgical History  Procedure Laterality Date  . Abdominal hysterectomy  1985    Dr Mancel Bale  . Tonsillectomy  1951  . Thyroidectomy  1987    Dr Guadlupe Spanish  . Carpal tunnel release Bilateral 2010    Dr Laurance Flatten  . Bunionectomy Right     Dr Jomarie Longs  . Rotator cuff repair Right     Dr Theda Sers  . Cesarean section  1983  . Colonoscopy      There were no vitals filed for this visit.  Visit Diagnosis:  Left hip pain  Activity intolerance  Decreased ROM of trunk and back  Decreased ROM of lower extremity  Piriformis muscle pain      Subjective Assessment - 01/07/16 0842    Subjective I am able to ride my stationary bike much better now.  I barely have any pain at all any more.  I was able to drive 3 hours with no problems.   Limitations Sitting;Standing;Walking;House hold activities   How long can you sit comfortably? 2-3 hours   How long can you stand comfortably? 1 hour   How long can you walk comfortably? 1 hour at  least   Patient Stated Goals I just want my left hip/back to get better so I dont have to take pain medicine.     Currently in Pain? No/denies  1/10 with palpation            [facility] PT Assessment - 01/07/16 0902    Observation/Other Assessments   Focus on Therapeutic Outcomes (FOTO)  Intake 63%, predicted 45% actual limitation 37%   Squat   Comments pt able to squat with even wt bearing   AROM   Left Hip Flexion 114  no pain   Left Hip Internal Rotation  27   no pain    Lumbar Flexion 90   Lumbar Extension 29  no pain   Lumbar - Right Side Bend 28   Lumbar - Left Side Bend 26   Lumbar - Right Rotation 90%   Lumbar - Left Rotation 90%   Palpation   Palpation comment minimal pain in proximal left piriformis  1/10   Ambulation/Gait   Ambulation/Gait Yes   Stairs Yes   Stairs Assistance 7: Independent   Stair Management Technique Alternating pattern   Number of Stairs 20  no pain   Height of Stairs 6  [facility] PT Treatment/Exercise - 01/07/16 0902    Self-Care   Self-Care Other Self-Care Comments   Other Self-Care Comments  educated on proper posture and knee position on stationary for prevention of injury  suggested getting wider seat for stationary bike for comfort   Lumbar Exercises: Stretches   Passive Hamstring Stretch 2 reps;30 seconds   Passive Hamstring Stretch Limitations left side strap  2 times 30 with IT band stretch   Pelvic Tilt 5 reps   ITB Stretch 2 reps;30 seconds  using chair for IT band stretch in standing as shown in clin   Piriformis Stretch 2 reps;30 seconds   Piriformis Stretch Limitations sitting with left leg crossed   Lumbar Exercises: Supine   Bridge 10 reps   Bridge Limitations Bridge with bent knee raise 10 X 2 sets altermating.    Straight Leg Raise 10 reps  x 2   Other Supine Lumbar Exercises single limb bridge x 10 on right and left with VC   Knee/Hip Exercises: Standing   Other Standing Knee  Exercises 2 way green band 10 X 2sets cues needed,  for proper alignment  VC for slowing down   Other Standing Knee Exercises sit to stand x 10 with VC to slow down  without pain exacerbation   Knee/Hip Exercises: Sidelying   Clams 20 X green band  bil   Iontophoresis   Type of Iontophoresis Dexamethasone   Location piriformis,distal   Dose 1cc od 4 mg.ml   Time 4 hoiurs.   Manual Therapy   Manual Therapy Soft tissue mobilization   Soft tissue mobilization  piriformis followed by passive stretch,  gentle use of IASTYM for sacral scrubbing on left                  PT Short Term Goals - 12/31/15 0856    PT SHORT TERM GOAL #1   Title "Independent with initial HEP 01-05-16   Time 3   Period Weeks   Status Achieved   PT SHORT TERM GOAL #2   Title "Report pain decrease from  9 /10 to  4 /10  01-05-16   Baseline 2/10 for pain   Time 3   Period Weeks   Status Achieved   PT SHORT TERM GOAL #3   Title "Demonstrate understanding of proper sitting posture and be more conscious of position and posture throughout the day.  01-05-16   Baseline Pt is more aware of posture and is able to verbalize 3 strategies   Time 3   Period Weeks   Status Achieved           PT Long Term Goals - 01/07/16 0850    PT LONG TERM GOAL #1   Title "Demonstrate and verbalize techniques to reduce the risk of re-injury including: lifting, posture, body mechanics.    Time 6   Period Weeks   Status Achieved   PT LONG TERM GOAL #2   Title "Pt will be independent with advanced HEP.    Time 6   Period Weeks   Status Achieved   PT LONG TERM GOAL #3   Title "Pt will tolerate sitting for 1 hour without increased pain to ride in car without increased pain.   Baseline Pt states no problems sittng for at least an hour   Time 6   Period Weeks   Status Achieved   PT LONG TERM GOAL #4   Title "FOTO will improve from   59%  to  40%  indicating improved functional mobility .    Baseline liimtation 37%    Time 6   Period Weeks   Status Achieved   PT LONG TERM GOAL #5   Title Pt will be able to return to exercise on stationary bike 4 x a week without increased pain in low back and left hip   Baseline Pt performed  riding bike 5 x a week   Time 6   Period Weeks   Status Achieved               Plan - 2016/01/09 1036    Clinical Impression Statement Pt has achieved all STG's and LTG's and is ready for DC.  Pt educated on stationary bike postion and suggested use of wider exericise seat for comfort.  Pt was able to drive in car for 3 hours and now uses her stationary bike 5 x a week.   Pt is independent with HEP and is pleased with current level . Pt surpassed  FOTO liimitation of predictd 43% limtation to 37% limitation.  Pt  is now returning to PLOF and activity with  zero ot minival pain.   Rehab Potential Excellent   PT Next Visit Plan DC   Consulted and Agree with Plan of Care Patient          G-Codes - 01-09-16 0906    Functional Assessment Tool Used FOTO   Functional Limitation Mobility: Walking and moving around   Mobility: Walking and Moving Around Goal Status [phone]) At least 20 percent but less than 40 percent impaired, limited or restricted   Mobility: Walking and Moving Around Discharge Status [phone]) At least 20 percent but less than 40 percent impaired, limited or restricted  37%      Problem List Patient Active Problem List   Diagnosis Date Noted  . Hiatal hernia with gastroesophageal reflux 12/23/2014  . Atypical chest pain 08/13/2014  . Abdominal pain, epigastric 08/13/2014  . Left-sided low back pain with left-sided sciatica 08/12/2014  . Helicobacter pylori gastritis 08/12/2014  . Helicobacter pylori (H. pylori) infection 07/15/2014  . Skin spots-aging 07/08/2014  . RLS (restless legs syndrome) 07/08/2014  . Chest pain 07/04/2014  . Heartburn 04/30/2014  . Headache(784.0) 04/30/2014  . Pain of right breast 04/30/2014  . Osteopenia 01/08/2014  .  Restless leg syndrome 08/20/2013  . Unspecified constipation 08/20/2013  . Hypothyroidism 02/19/2013  . Callus of heel 02/19/2013  . Need for prophylactic vaccination against Streptococcus pneumoniae (pneumococcus) 02/19/2013  . Routine general medical examination at a health care facility 02/19/2013  . Postmenopausal estrogen deficiency 02/19/2013  . H/O total hysterectomy 02/19/2013  . Disorder of bone and cartilage, unspecified 02/19/2013    Voncille Lo, PT 01-09-16 10:43 AM Phone: [phone] Fax: [facility] [place], [place], [address] Phone: [phone]   Fax:  [phone]  Name: Colleen [name] MRN: [id number] Date of Birth: 1945-11-06  PHYSICAL THERAPY DISCHARGE SUMMARY  Visits from Start of Care: 7  Current functional level related to goals / functional outcomes: See above   Remaining deficits: None   Education / Equipment: HEP with green tband Plan: Patient agrees to discharge.  Patient goals were met. Patient is being discharged due to meeting the stated rehab goals.  ????? and being pleased with current functional status         Voncille Lo, PT Jan 09, 2016 10:44 AM Phone: [phone] Fax: [phone]

## 2016-01-11 ENCOUNTER — Ambulatory Visit: Payer: Medicare Other | Admitting: Physical Therapy

## 2016-01-12 ENCOUNTER — Other Ambulatory Visit: Payer: Self-pay

## 2016-01-12 MED ORDER — ROPINIROLE HCL 0.25 MG PO TABS: ORAL_TABLET | ORAL | Status: DC

## 2016-01-14 ENCOUNTER — Ambulatory Visit: Payer: Medicare Other | Admitting: Physical Therapy

## 2016-01-18 ENCOUNTER — Encounter: Payer: Self-pay | Admitting: Podiatry

## 2016-01-18 ENCOUNTER — Ambulatory Visit (INDEPENDENT_AMBULATORY_CARE_PROVIDER_SITE_OTHER): Payer: Medicare Other | Admitting: Podiatry

## 2016-01-18 VITALS — BP 126/62 | HR 65 | Resp 16

## 2016-01-18 DIAGNOSIS — L600 Ingrowing nail: Secondary | ICD-10-CM

## 2016-01-18 DIAGNOSIS — B351 Tinea unguium: Secondary | ICD-10-CM | POA: Diagnosis not present

## 2016-01-19 NOTE — Progress Notes (Signed)
Subjective:     Patient ID: Colleen [name], female   DOB: 1946-03-11, 70 y.o.   MRN: [id number]  HPI patient presents concerned because her right hallux nail is halfway off and it is moderately painful when pressed. Does not remember specific injury   Review of Systems  All other systems reviewed and are negative.      Objective:   Physical Exam  Constitutional: She is oriented to person, place, and time.  Cardiovascular: Intact distal pulses.   Musculoskeletal: Normal range of motion.  Neurological: She is oriented to person, place, and time.  Skin: Skin is warm and dry.  Nursing note and vitals reviewed. Neurovascular status intact muscle strength adequate with patient found to have a hallux nail right that is ripped in the distal two thirds localized in nature with no current drainage noted or other erythema process      Assessment:     Probable trauma to the right hallux nail with the distal two thirds affected    Plan:     Reviewed condition and recommended removal of the nail. Explained the risk of procedure and today I infiltrated 60 mg like Marcaine mixture remove the distal two thirds of the nail flush the bed out and applied sterile dressing. Gave instructions on soaks and reappoint to recheck

## 2016-01-25 ENCOUNTER — Other Ambulatory Visit: Payer: Self-pay

## 2016-01-25 ENCOUNTER — Telehealth: Payer: Self-pay

## 2016-01-25 DIAGNOSIS — K862 Cyst of pancreas: Secondary | ICD-10-CM

## 2016-01-25 NOTE — Telephone Encounter (Signed)
-----   Message from [name], RN sent at 01/12/2015  1:07 PM EDT ----- Needs MRI - see results on CT 01/12/15- Dr. Henrene Pastor

## 2016-01-25 NOTE — Telephone Encounter (Signed)
Pt scheduled for MRI of abdomen at [facility] 02/02/16@4pm . Pt to arrive there at 3:45pm and be NPO after midnight. Pt aware of appt.

## 2016-01-29 ENCOUNTER — Emergency Department (HOSPITAL_COMMUNITY)
Admission: EM | Admit: 2016-01-29 | Discharge: 2016-01-29 | Disposition: A | Discharge status: Home / Self Care | Payer: Medicare Other | Attending: Emergency Medicine | Admitting: Emergency Medicine

## 2016-01-29 ENCOUNTER — Encounter (HOSPITAL_COMMUNITY): Payer: Self-pay | Admitting: *Deleted

## 2016-01-29 DIAGNOSIS — Z79899 Other long term (current) drug therapy: Secondary | ICD-10-CM | POA: Diagnosis not present

## 2016-01-29 DIAGNOSIS — Z8709 Personal history of other diseases of the respiratory system: Secondary | ICD-10-CM | POA: Insufficient documentation

## 2016-01-29 DIAGNOSIS — E7800 Pure hypercholesterolemia, unspecified: Secondary | ICD-10-CM | POA: Diagnosis not present

## 2016-01-29 DIAGNOSIS — K219 Gastro-esophageal reflux disease without esophagitis: Secondary | ICD-10-CM | POA: Diagnosis not present

## 2016-01-29 DIAGNOSIS — E669 Obesity, unspecified: Secondary | ICD-10-CM | POA: Diagnosis not present

## 2016-01-29 DIAGNOSIS — R42 Dizziness and giddiness: Secondary | ICD-10-CM | POA: Insufficient documentation

## 2016-01-29 DIAGNOSIS — R001 Bradycardia, unspecified: Secondary | ICD-10-CM | POA: Diagnosis not present

## 2016-01-29 DIAGNOSIS — Z8742 Personal history of other diseases of the female genital tract: Secondary | ICD-10-CM | POA: Insufficient documentation

## 2016-01-29 DIAGNOSIS — E039 Hypothyroidism, unspecified: Secondary | ICD-10-CM | POA: Insufficient documentation

## 2016-01-29 DIAGNOSIS — Z872 Personal history of diseases of the skin and subcutaneous tissue: Secondary | ICD-10-CM | POA: Insufficient documentation

## 2016-01-29 DIAGNOSIS — Z7982 Long term (current) use of aspirin: Secondary | ICD-10-CM | POA: Diagnosis not present

## 2016-01-29 DIAGNOSIS — M1990 Unspecified osteoarthritis, unspecified site: Secondary | ICD-10-CM | POA: Insufficient documentation

## 2016-01-29 DIAGNOSIS — Z8669 Personal history of other diseases of the nervous system and sense organs: Secondary | ICD-10-CM | POA: Insufficient documentation

## 2016-01-29 DIAGNOSIS — R55 Syncope and collapse: Secondary | ICD-10-CM | POA: Diagnosis not present

## 2016-01-29 DIAGNOSIS — F419 Anxiety disorder, unspecified: Secondary | ICD-10-CM | POA: Insufficient documentation

## 2016-01-29 DIAGNOSIS — Z87891 Personal history of nicotine dependence: Secondary | ICD-10-CM | POA: Insufficient documentation

## 2016-01-29 DIAGNOSIS — I499 Cardiac arrhythmia, unspecified: Secondary | ICD-10-CM | POA: Diagnosis not present

## 2016-01-29 DIAGNOSIS — Z86010 Personal history of colonic polyps: Secondary | ICD-10-CM | POA: Insufficient documentation

## 2016-01-29 LAB — BASIC METABOLIC PANEL
Anion gap: 9 (ref 5–15)
BUN: 13 mg/dL (ref 6–20)
CO2: 26 mmol/L (ref 22–32)
Calcium: 9.4 mg/dL (ref 8.9–10.3)
Chloride: 107 mmol/L (ref 101–111)
Creatinine, Ser: 1.08 mg/dL — ABNORMAL HIGH (ref 0.44–1.00)
GFR calc Af Amer: 58 mL/min — ABNORMAL LOW (ref >60)
GFR calc non Af Amer: 50 mL/min — ABNORMAL LOW (ref >60)
Glucose, Bld: 107 mg/dL — ABNORMAL HIGH (ref 65–99)
Potassium: 3.9 mmol/L (ref 3.5–5.1)
Sodium: 142 mmol/L (ref 135–145)

## 2016-01-29 LAB — CBC WITH DIFFERENTIAL/PLATELET
Basophils Absolute: 0 10*3/uL (ref 0.0–0.1)
Basophils Relative: 1 %
Eosinophils Absolute: 0.1 10*3/uL (ref 0.0–0.7)
Eosinophils Relative: 2 %
HCT: 38.2 % (ref 36.0–46.0)
Hemoglobin: 11.9 g/dL — ABNORMAL LOW (ref 12.0–15.0)
Lymphocytes Relative: 49 %
Lymphs Abs: 2.7 10*3/uL (ref 0.7–4.0)
MCH: 29.7 pg (ref 26.0–34.0)
MCHC: 31.2 g/dL (ref 30.0–36.0)
MCV: 95.3 fL (ref 78.0–100.0)
Monocytes Absolute: 0.3 10*3/uL (ref 0.1–1.0)
Monocytes Relative: 6 %
Neutro Abs: 2.3 10*3/uL (ref 1.7–7.7)
Neutrophils Relative %: 42 %
Platelets: 233 10*3/uL (ref 150–400)
RBC: 4.01 MIL/uL (ref 3.87–5.11)
RDW: 13.7 % (ref 11.5–15.5)
WBC: 5.5 10*3/uL (ref 4.0–10.5)

## 2016-01-29 LAB — I-STAT TROPONIN, ED: Troponin i, poc: 0.01 ng/mL (ref 0.00–0.08)

## 2016-01-29 MED ORDER — SODIUM CHLORIDE 0.9 % IV BOLUS (SEPSIS)
1000.0000 mL | Freq: Once | INTRAVENOUS | Status: AC
  Administered 2016-01-29: 1000 mL via INTRAVENOUS

## 2016-01-29 NOTE — ED Notes (Signed)
Pt arrives via EMS from home. Pts daughter woke up pt and pt was walking to bathroom when she became weak and had to sit down. Pt then laid her head on her daughters shoulder and began profusely sweating. Pts daughter called EMS and pt slowly came back to on her own. EMS reports no orthostatic changes, alert and oriented x4, no neuro deficits CBG 90. HR SB 40's per pts baseline.

## 2016-01-29 NOTE — Discharge Instructions (Signed)
Return to the emergency department if you develop chest pain or difficulty breathing.   Near-Syncope Near-syncope (commonly known as near fainting) is sudden weakness, dizziness, or feeling like you might pass out. During an episode of near-syncope, you may also develop pale skin, have tunnel vision, or feel sick to your stomach (nauseous). Near-syncope may occur when getting up after sitting or while standing for a long time. It is caused by a sudden decrease in blood flow to the brain. This decrease can result from various causes or triggers, most of which are not serious. However, because near-syncope can sometimes be a sign of something serious, a medical evaluation is required. The specific cause is often not determined. HOME CARE INSTRUCTIONS  Monitor your condition for any changes. The following actions may help to alleviate any discomfort you are experiencing:  Have someone stay with you until you feel stable.  Lie down right away and prop your feet up if you start feeling like you might faint. Breathe deeply and steadily. Wait until all the symptoms have passed. Most of these episodes last only a few minutes. You may feel tired for several hours.   Drink enough fluids to keep your urine clear or pale yellow.   If you are taking blood pressure or heart medicine, get up slowly when seated or lying down. Take several minutes to sit and then stand. This can reduce dizziness.  Follow up with your health care provider as directed. SEEK IMMEDIATE MEDICAL CARE IF:   You have a severe headache.   You have unusual pain in the chest, abdomen, or back.   You are bleeding from the mouth or rectum, or you have black or tarry stool.   You have an irregular or very fast heartbeat.   You have repeated fainting or have seizure-like jerking during an episode.   You faint when sitting or lying down.   You have confusion.   You have difficulty walking.   You have severe weakness.    You have vision problems.  MAKE SURE YOU:   Understand these instructions.  Will watch your condition.  Will get help right away if you are not doing well or get worse.   This information is not intended to replace advice given to you by your health care provider. Make sure you discuss any questions you have with your health care provider.   Document Released: 10/17/2005 Document Revised: 10/22/2013 Document Reviewed: 03/22/2013 Elsevier Interactive Patient Education [employer].

## 2016-01-29 NOTE — ED Provider Notes (Signed)
CSN: [id number]     Arrival date & time 01/29/16  0013 History  By signing my name below, I, Arianna Nassar, attest that this documentation has been prepared under the direction and in the presence of Veryl Speak, MD. Electronically Signed: Julien Nordmann, ED Scribe. 01/29/2016. 12:52 AM.    Chief Complaint  Patient presents with  . Near Syncope      The history is provided by the patient. No language interpreter was used.   HPI Comments: Colleen [name] is a 70 y.o. female who was brought in via EMS has a PMHx of hypothyroidism, GERD, diverticulosis, hypercholesteremia presents to the Emergency Department complaining of sudden onset, gradual improving near syncope onset this evening.  Per pt's daughter, pt was standing in the bathroom when pt sudden said "I feel light-headed" and had an "off" look to her face. Pt sat down and laid her head on daughters chest and felt like "dead-weight" and was unresponsive to most words unless she was called by her name. Daughter states that the episode lasted about 5 minutes. Pt states she did not get much rest last night and drove her by herself from [place]. Pt did not lose consciousness and states she remembered what happened. Denies recent cough, cold symptoms, abdominal pain, leg swelling, hx of heart problems, hx of lung problems, or loss of appetite out of baseline.  Past Medical History  Diagnosis Date  . Hypothyroidism   . Corns and callosities   . Urge incontinence   . Screening for diabetes mellitus   . Arthritis   . Obesity   . GERD (gastroesophageal reflux disease)   . Glaucoma   . Colon polyps     adenomatous  . Diverticulosis   . Anxiety   . Allergic rhinitis   . Hypercholesteremia    Past Surgical History  Procedure Laterality Date  . Abdominal hysterectomy  1985    Dr Mancel Bale  . Tonsillectomy  1951  . Thyroidectomy  1987    Dr Guadlupe Spanish  . Carpal tunnel release Bilateral 2010    Dr Laurance Flatten  . Bunionectomy Right     Dr Jomarie Longs   . Rotator cuff repair Right     Dr Theda Sers  . Cesarean section  1983  . Colonoscopy     Family History  Problem Relation Age of Onset  . Leukemia Mother 21  . Lung cancer Father   . Diabetes Brother   . COPD Brother   . Lung cancer Brother   . Diabetes Maternal Grandmother   . Cancer Maternal Grandfather     unknown type  . Colon cancer Mother    Social History  Substance Use Topics  . Smoking status: Former Smoker    Types: Cigarettes    Quit date: 10/31/1986  . Smokeless tobacco: Never Used  . Alcohol Use: No   OB History    No data available     Review of Systems  A complete 10 system review of systems was obtained and all systems are negative except as noted in the HPI and PMH.    Allergies  Review of patient's allergies indicates no known allergies.  Home Medications   Prior to Admission medications   Medication Sig Start Date End Date Taking? Authorizing Provider  acetaminophen (TYLENOL) 500 MG tablet Take 1,000 mg by mouth every 6 (six) hours as needed for mild pain.    Historical Provider, MD  acidophilus (RISAQUAD) CAPS capsule Take 1 capsule by mouth daily.    Historical Provider,  MD  Ascorbic Acid (VITAMIN C PO) Take 1 tablet by mouth daily.    Historical Provider, MD  aspirin 81 MG tablet Take 81 mg by mouth daily.    Historical Provider, MD  CALCIUM-VITAMIN D PO Take 2 tablets by mouth daily.     Historical Provider, MD  levothyroxine (SYNTHROID, LEVOTHROID) 50 MCG tablet Take 1 tablet (50 mcg total) by mouth daily. 10/22/15   [name], NP  Multiple Vitamin (MULTIVITAMIN WITH MINERALS) TABS Take 1 tablet by mouth daily.    Historical Provider, MD  nitroGLYCERIN (NITROSTAT) 0.4 MG SL tablet Place 1 tablet (0.4 mg total) under the tongue every 5 (five) minutes x 3 doses as needed for chest pain. 07/05/14   Eileen Stanford, PA-C  Omega-3 Fatty Acids (OMEGA 3 PO) Take 1 gel cap daily    Historical Provider, MD  pantoprazole (PROTONIX) 40 MG tablet  Take 1 tablet (40 mg total) by mouth daily. 12/03/15   [name], NP  ranitidine (ZANTAC) 150 MG tablet Take 150 mg by mouth 2 (two) times daily.    Historical Provider, MD  rOPINIRole (REQUIP) 0.25 MG tablet TAKE 1 TABLET BY MOUTH ONCE DAILY AFTER SUPPER. (TAKE 1-2 HOURS BEFORE GOING TO BED) 01/12/16   [name], NP  Simethicone (GAS RELIEF) 125 MG CAPS Take 1 each by mouth 4 (four) times daily. As needed for gas relief    Historical Provider, MD  Tdap (BOOSTRIX) 5-2.5-18.5 LF-MCG/0.5 injection Inject 0.5 mLs into the muscle once. 10/22/15   [name], NP  traMADol (ULTRAM) 50 MG tablet Take 1 tablet (50 mg total) by mouth every 6 (six) hours as needed. 12/03/15   [name], NP   Triage vitals: BP 127/60 mmHg  Pulse 59  Temp(Src) 97.7 F (36.5 C) (Oral)  Resp 20  SpO2 96% Physical Exam  Constitutional: She is oriented to person, place, and time. She appears well-developed and well-nourished. No distress.  HENT:  Head: Normocephalic and atraumatic.  Eyes: EOM are normal.  Neck: Normal range of motion.  Cardiovascular: Normal rate, regular rhythm and normal heart sounds.   Pulmonary/Chest: Effort normal and breath sounds normal.  Abdominal: Soft. She exhibits no distension. There is no tenderness.  Musculoskeletal: Normal range of motion.  Neurological: She is alert and oriented to person, place, and time.  Skin: Skin is warm and dry.  Psychiatric: She has a normal mood and affect. Judgment normal.  Nursing note and vitals reviewed.   ED Course  Procedures  DIAGNOSTIC STUDIES: Oxygen Saturation is 96% on RA, normal by my interpretation.  COORDINATION OF CARE:  12:52 AM Discussed treatment plan which includes lab work and IV fluids with pt at bedside and pt agreed to plan.  Labs Review Labs Reviewed - No data to display  Imaging Review No results found. I have personally reviewed and evaluated these images and lab results as part of my medical  decision-making.   EKG Interpretation   Date/Time:  Friday January 29 2016 00:32:38 EDT Ventricular Rate:  56 PR Interval:  150 QRS Duration: 110 QT Interval:  446 QTC Calculation: 430 R Axis:   -24 Text Interpretation:  Sinus rhythm Borderline left axis deviation Abnormal  R-wave progression, early transition ST elevation, consider inferior  injury Confirmed by Denetra Formoso  MD, Kanchan Gal ([id number]) on 01/29/2016 12:47:23 AM      MDM   Final diagnoses:  None    Patient brought for evaluation of near syncope. Workup today is  unremarkable. Her EKG is unchanged and laboratory studies reveal no evidence for anemia, leg avoid abnormality, or other concerning finding. She was observed for several hours in the ER and had no further episodes. Her daughter who is a Equities trader is present at bedside and is comfortable with taking her home. She will keep an eye on her this evening and return to the ER if she experiences any difficulty. I suspect this episode was some sort of vasovagal type near-syncope.  I personally performed the services described in this documentation, which was scribed in my presence. The recorded information has been reviewed and is accurate.      Veryl Speak, MD 01/29/16 [phone]

## 2016-02-02 ENCOUNTER — Ambulatory Visit (HOSPITAL_COMMUNITY)
Admission: RE | Admit: 2016-02-02 | Discharge: 2016-02-02 | Disposition: A | Discharge status: Home / Self Care | Payer: Medicare Other | Source: Ambulatory Visit | Attending: Internal Medicine | Admitting: Internal Medicine

## 2016-02-02 DIAGNOSIS — K7689 Other specified diseases of liver: Secondary | ICD-10-CM | POA: Diagnosis not present

## 2016-02-02 DIAGNOSIS — K862 Cyst of pancreas: Secondary | ICD-10-CM | POA: Diagnosis not present

## 2016-02-02 MED ORDER — GADOBENATE DIMEGLUMINE 529 MG/ML IV SOLN
20.0000 mL | Freq: Once | INTRAVENOUS | Status: AC | PRN
  Administered 2016-02-02: 18 mL via INTRAVENOUS

## 2016-02-08 DIAGNOSIS — N390 Urinary tract infection, site not specified: Secondary | ICD-10-CM | POA: Diagnosis not present

## 2016-02-08 DIAGNOSIS — R030 Elevated blood-pressure reading, without diagnosis of hypertension: Secondary | ICD-10-CM | POA: Diagnosis not present

## 2016-02-08 DIAGNOSIS — R319 Hematuria, unspecified: Secondary | ICD-10-CM | POA: Diagnosis not present

## 2016-02-08 DIAGNOSIS — R011 Cardiac murmur, unspecified: Secondary | ICD-10-CM | POA: Diagnosis not present

## 2016-02-09 ENCOUNTER — Ambulatory Visit: Payer: Self-pay | Admitting: Internal Medicine

## 2016-02-24 ENCOUNTER — Encounter: Payer: Self-pay | Admitting: Internal Medicine

## 2016-02-24 ENCOUNTER — Ambulatory Visit (INDEPENDENT_AMBULATORY_CARE_PROVIDER_SITE_OTHER): Payer: Medicare Other | Admitting: Internal Medicine

## 2016-02-24 VITALS — BP 132/66 | HR 56 | Temp 98.5°F | Ht 61.0 in | Wt 186.0 lb

## 2016-02-24 DIAGNOSIS — R300 Dysuria: Secondary | ICD-10-CM

## 2016-02-24 DIAGNOSIS — N3000 Acute cystitis without hematuria: Secondary | ICD-10-CM

## 2016-02-24 DIAGNOSIS — N390 Urinary tract infection, site not specified: Secondary | ICD-10-CM | POA: Insufficient documentation

## 2016-02-24 LAB — POCT URINALYSIS DIPSTICK
Bilirubin, UA: NEGATIVE
Blood, UA: NEGATIVE
Glucose, UA: NEGATIVE
Ketones, UA: NEGATIVE
Nitrite, UA: NEGATIVE
Protein, UA: NEGATIVE
Spec Grav, UA: 1.01
Urobilinogen, UA: NEGATIVE
pH, UA: 5

## 2016-02-24 MED ORDER — MICONAZOLE NITRATE 2 % VA CREA: 1.0000 | TOPICAL_CREAM | Freq: Every day | VAGINAL | Status: DC

## 2016-02-24 NOTE — Progress Notes (Signed)
Patient ID: Colleen [name], female   DOB: [birth year], 70 y.o.   MRN: [id number]    Facility  [facility]    Place of Service:   OFFICE    No Known Allergies  Chief Complaint  Patient presents with  . burning    with urination 5 days ago. On 02/08/16 [facility], Dx UTI was given cipro 500 mg twice daily for 5 days. Helped her . Now not as bad as before. No blood in urine.     HPI:  Seen at [facility] on [address]. Cipro helped the pains. Urination causes burning sensatiion now. Has had a yeast infection in the past.  Medications: Patient's Medications  New Prescriptions   No medications on file  Previous Medications   ACETAMINOPHEN (TYLENOL) 500 MG TABLET    Take 1,000 mg by mouth every 6 (six) hours as needed for mild pain.   ACIDOPHILUS (RISAQUAD) CAPS CAPSULE    Take 1 capsule by mouth daily.   ASCORBIC ACID (VITAMIN C PO)    Take 1 tablet by mouth daily.   ASPIRIN 81 MG TABLET    Take 81 mg by mouth daily.   CALCIUM-VITAMIN D PO    Take 2 tablets by mouth daily.    HYDROXYPROPYL METHYLCELLULOSE / HYPROMELLOSE (ISOPTO TEARS / GONIOVISC) 2.5 % OPHTHALMIC SOLUTION    Place 1 drop into both eyes 4 (four) times daily as needed for dry eyes.   LEVOTHYROXINE (SYNTHROID, LEVOTHROID) 50 MCG TABLET    Take 1 tablet (50 mcg total) by mouth daily.   MULTIPLE VITAMIN (MULTIVITAMIN WITH MINERALS) TABS    Take 1 tablet by mouth daily.   NITROGLYCERIN (NITROSTAT) 0.4 MG SL TABLET    Place 1 tablet (0.4 mg total) under the tongue every 5 (five) minutes x 3 doses as needed for chest pain.   OMEGA-3 FATTY ACIDS (OMEGA 3 PO)    Take 1 gel cap daily   PANTOPRAZOLE (PROTONIX) 40 MG TABLET    Take 1 tablet (40 mg total) by mouth daily.   RANITIDINE (ZANTAC) 150 MG TABLET    Take 150 mg by mouth 2 (two) times daily.   ROPINIROLE (REQUIP) 0.25 MG TABLET    TAKE 1 TABLET BY MOUTH ONCE DAILY AFTER SUPPER. (TAKE 1-2 HOURS BEFORE GOING TO BED)   SIMETHICONE (GAS RELIEF) 125 MG CAPS    Take 1 each  by mouth 4 (four) times daily as needed (GAS). As needed for gas relief   TRAMADOL (ULTRAM) 50 MG TABLET    Take 1 tablet (50 mg total) by mouth every 6 (six) hours as needed.  Modified Medications   No medications on file  Discontinued Medications   No medications on file    Review of Systems  Constitutional: Negative for activity change, appetite change, fatigue and unexpected weight change.  HENT: Negative for congestion and hearing loss.   Eyes: Negative.   Respiratory: Negative for cough and shortness of breath.   Cardiovascular: Negative for chest pain, palpitations and leg swelling.  Gastrointestinal: Negative for abdominal pain, diarrhea and constipation.  Genitourinary: Negative for dysuria and difficulty urinating.  Musculoskeletal: Positive for myalgias and arthralgias.       Still having pain but nothing like what it was  Skin: Negative for color change and wound.  Neurological: Negative for dizziness and weakness.  Psychiatric/Behavioral: Negative for behavioral problems, confusion and agitation.  All other systems reviewed and are negative.   Filed Vitals:   02/24/16 1509  BP: 132/66  Pulse: 56  Temp: 98.5 F (36.9 C)  Height: _0  (1.549 m)  Weight: 186 lb (84.369 kg)  SpO2: 97%   Body mass index is 35.16 kg/(m^2). Filed Weights   02/24/16 1509  Weight: 186 lb (84.369 kg)     Physical Exam  Constitutional: She is oriented to person, place, and time. She appears well-developed and well-nourished. No distress.  Cardiovascular: Normal rate, regular rhythm and normal heart sounds.   Pulmonary/Chest: Effort normal and breath sounds normal.  Abdominal: Soft. Bowel sounds are normal. She exhibits no distension.  Musculoskeletal: Normal range of motion. She exhibits no edema.  Neurological: She is alert and oriented to person, place, and time.  Skin: Skin is warm and dry. She is not diaphoretic.  Psychiatric: She has a normal mood and affect.    Labs  reviewed: Lab Summary Latest Ref Rng 01/29/2016 10/19/2015 01/07/2015  Hemoglobin 12.0 - 15.0 g/dL 11.9(L) 11.5 12.3  Hematocrit 36.0 - 46.0 % 38.2 35.5 37.4  White count 4.0 - 10.5 K/uL 5.5 3.5 4.9  Platelet count 150 - 400 K/uL 233 242 246.0  Sodium 135 - 145 mmol/L 142 145(H) 141  Potassium 3.5 - 5.1 mmol/L 3.9 3.9 3.7  Calcium 8.9 - 10.3 mg/dL 9.4 9.4 10.4  Phosphorus - (None) (None) (None)  Creatinine 0.44 - 1.00 mg/dL 1.08(H) 1.07(H) 0.93  AST 0 - 40 IU/L (None) 23 25  Alk Phos 39 - 117 IU/L (None) 89 108  Bilirubin 0.0 - 1.2 mg/dL (None) 0.4 0.4  Glucose 65 - 99 mg/dL 107(H) 85 95  Cholesterol - (None) (None) (None)  HDL cholesterol >39 mg/dL (None) 91 (None)  Triglycerides 0 - 149 mg/dL (None) 50 (None)  LDL Direct - (None) (None) (None)  LDL Calc 0 - 99 mg/dL (None) 105(H) (None)  Total protein 6.0 - 8.3 g/dL (None) (None) 7.5  Albumin 3.5 - 4.8 g/dL (None) 3.8 4.3   Lab Results  Component Value Date   TSH 0.758 10/19/2015   TSH 1.130 12/23/2014   TSH 2.190 07/04/2014   T4TOTAL 8.9 02/13/2013   Lab Results  Component Value Date   BUN 13 01/29/2016   BUN 14 10/19/2015   BUN 18 01/07/2015   Lab Results  Component Value Date   HGBA1C 5.6 07/05/2014   HGBA1C 5.4 02/13/2013    Assessment/Plan  1. Dysuria - POC Urinalysis Dipstick: appears normal  2. Acute cystitis without hematuria Probable yeast infection followin use of Ciro - miconazole (MONISTAT 7) 2 % vaginal cream; Place 1 Applicatorful vaginally at bedtime.  Dispense: 45 g; Refill: 0

## 2016-03-05 DIAGNOSIS — H2513 Age-related nuclear cataract, bilateral: Secondary | ICD-10-CM | POA: Diagnosis not present

## 2016-03-05 DIAGNOSIS — H40033 Anatomical narrow angle, bilateral: Secondary | ICD-10-CM | POA: Diagnosis not present

## 2016-03-11 ENCOUNTER — Encounter: Payer: Self-pay | Admitting: Internal Medicine

## 2016-03-11 ENCOUNTER — Telehealth: Payer: Self-pay | Admitting: *Deleted

## 2016-03-11 MED ORDER — FLUCONAZOLE 100 MG PO TABS: 100.0000 mg | ORAL_TABLET | Freq: Every day | ORAL | Status: DC

## 2016-03-11 NOTE — Telephone Encounter (Signed)
Patient notified and agreed. Rx faxed to pharmacy.  

## 2016-03-11 NOTE — Telephone Encounter (Signed)
Patient called and stated that she has finished the Monistat you gave her but she still has the yeast infection with burning. Wants to know if there is anything else she can take. Please Advise.

## 2016-03-11 NOTE — Telephone Encounter (Signed)
Diflucan 100 mg (5) One daily to treat yeast infection.

## 2016-03-21 ENCOUNTER — Other Ambulatory Visit: Payer: Self-pay | Admitting: *Deleted

## 2016-03-21 DIAGNOSIS — K219 Gastro-esophageal reflux disease without esophagitis: Secondary | ICD-10-CM

## 2016-03-21 MED ORDER — PANTOPRAZOLE SODIUM 40 MG PO TBEC: 40.0000 mg | DELAYED_RELEASE_TABLET | Freq: Every day | ORAL | Status: DC

## 2016-03-21 NOTE — Telephone Encounter (Signed)
[facility] [address] 

## 2016-03-23 ENCOUNTER — Telehealth: Payer: Self-pay | Admitting: *Deleted

## 2016-03-23 DIAGNOSIS — R300 Dysuria: Secondary | ICD-10-CM

## 2016-03-23 NOTE — Telephone Encounter (Signed)
Patient called and stated that she has finished the cream and the Diflucan and still has burning when she urinates. Patient wants to know is there an antibiotic she can take to clear up. Please Advise.

## 2016-03-23 NOTE — Telephone Encounter (Signed)
We will need to clean catch urinalysis and culture. We can refer her to a urologist if she would like.

## 2016-03-24 NOTE — Telephone Encounter (Signed)
Patient wants the referral to Urologist. Referral placed.

## 2016-03-30 ENCOUNTER — Other Ambulatory Visit: Payer: Self-pay

## 2016-03-30 MED ORDER — LEVOTHYROXINE SODIUM 50 MCG PO TABS: 50.0000 ug | ORAL_TABLET | Freq: Every day | ORAL | Status: DC

## 2016-03-30 NOTE — Telephone Encounter (Signed)
Refill of levothyroxine 50 mcg tablets. #90 with 1 RF was sent to [facility] electronically.

## 2016-04-21 ENCOUNTER — Encounter: Payer: Self-pay | Admitting: Nurse Practitioner

## 2016-04-21 ENCOUNTER — Ambulatory Visit (INDEPENDENT_AMBULATORY_CARE_PROVIDER_SITE_OTHER): Payer: Medicare Other | Admitting: Nurse Practitioner

## 2016-04-21 VITALS — BP 132/78 | HR 60 | Temp 98.1°F | Resp 18 | Ht 61.0 in | Wt 186.4 lb

## 2016-04-21 DIAGNOSIS — K219 Gastro-esophageal reflux disease without esophagitis: Secondary | ICD-10-CM

## 2016-04-21 DIAGNOSIS — E038 Other specified hypothyroidism: Secondary | ICD-10-CM | POA: Diagnosis not present

## 2016-04-21 DIAGNOSIS — K5900 Constipation, unspecified: Secondary | ICD-10-CM | POA: Diagnosis not present

## 2016-04-21 DIAGNOSIS — E785 Hyperlipidemia, unspecified: Secondary | ICD-10-CM | POA: Diagnosis not present

## 2016-04-21 DIAGNOSIS — N898 Other specified noninflammatory disorders of vagina: Secondary | ICD-10-CM

## 2016-04-21 DIAGNOSIS — R300 Dysuria: Secondary | ICD-10-CM

## 2016-04-21 LAB — POCT URINALYSIS DIPSTICK
Bilirubin, UA: NEGATIVE
Blood, UA: NEGATIVE
Glucose, UA: NEGATIVE
Ketones, UA: NEGATIVE
Leukocytes, UA: NEGATIVE
Nitrite, UA: NEGATIVE
Protein, UA: NEGATIVE
Spec Grav, UA: 1.025
Urobilinogen, UA: 1
pH, UA: 5

## 2016-04-21 MED ORDER — ROPINIROLE HCL 0.25 MG PO TABS: ORAL_TABLET | ORAL | Status: DC

## 2016-04-21 MED ORDER — LEVOTHYROXINE SODIUM 50 MCG PO TABS: 50.0000 ug | ORAL_TABLET | Freq: Every day | ORAL | Status: DC

## 2016-04-21 NOTE — Patient Instructions (Signed)
Will get urine today- if abnormal we will send it off for further testing Keep urology appt  To use vaginal lubricant as needed  Follow up in 6 months with lab work prior to visit

## 2016-04-21 NOTE — Progress Notes (Signed)
Patient ID: Colleen [name], female   DOB: 1946/01/02, 70 y.o.   MRN: [id number]    PCP: [name], NP  Advanced Directive information Does patient have an advance directive?: Yes, Type of Advance Directive: Living will  No Known Allergies  Chief Complaint  Patient presents with  . Medical Management of Chronic Issues    6 month followup     HPI: Patient is a 70 y.o. female seen in the office today due for routine follow up. Hx of rotator cuff surgery on right with shoulder pain, GERD, hypothyroidism, RLS. Went to urgent care due to dysuria. Was treated for UTI with cipro and symptoms improved then had yeast infection but was treated with OTC and these symptoms resolved. Pt with ongoing dysuria. Referral placed for urology but not until next month    Otherwise doing well. exercising more 3 times a week 30 mins and eating healthier, small frequent meals.  Clothes are fitting better but weight is the same.   Review of Systems:  Review of Systems  Constitutional: Negative for activity change, appetite change, fatigue and unexpected weight change.  HENT: Negative for congestion and hearing loss.   Eyes: Negative.   Respiratory: Negative for cough and shortness of breath.   Cardiovascular: Negative for chest pain, palpitations and leg swelling.  Gastrointestinal: Negative for abdominal pain, diarrhea and constipation.       Reports occasional acid reflux  Genitourinary: Positive for dysuria and frequency. Negative for difficulty urinating.  Musculoskeletal: Negative for myalgias and arthralgias.  Skin: Negative for color change and wound.  Neurological: Negative for dizziness and weakness.  Psychiatric/Behavioral: Negative for behavioral problems, confusion and agitation.  All other systems reviewed and are negative.   Past Medical History  Diagnosis Date  . Hypothyroidism   . Corns and callosities   . Urge incontinence   . Screening for diabetes mellitus   . Arthritis    . Obesity   . GERD (gastroesophageal reflux disease)   . Glaucoma   . Colon polyps     adenomatous  . Diverticulosis   . Anxiety   . Allergic rhinitis   . Hypercholesteremia    Past Surgical History  Procedure Laterality Date  . Abdominal hysterectomy  1985    Dr Mancel Bale  . Tonsillectomy  1951  . Thyroidectomy  1987    Dr Guadlupe Spanish  . Carpal tunnel release Bilateral 2010    Dr Laurance Flatten  . Bunionectomy Right     Dr Jomarie Longs  . Rotator cuff repair Right     Dr Theda Sers  . Cesarean section  1983  . Colonoscopy     Social History:   reports that she quit smoking about 29 years ago. Her smoking use included Cigarettes. She has never used smokeless tobacco. She reports that she does not drink alcohol or use illicit drugs.  Family History  Problem Relation Age of Onset  . Leukemia Mother 55  . Lung cancer Father   . Diabetes Brother   . COPD Brother   . Lung cancer Brother   . Diabetes Maternal Grandmother   . Cancer Maternal Grandfather     unknown type  . Colon cancer Mother     Medications: Patient's Medications  New Prescriptions   No medications on file  Previous Medications   ACETAMINOPHEN (TYLENOL) 500 MG TABLET    Take 1,000 mg by mouth every 6 (six) hours as needed for mild pain.   ASCORBIC ACID (VITAMIN C PO)    Take  1 tablet by mouth daily.   ASPIRIN 81 MG TABLET    Take 81 mg by mouth daily.   CALCIUM-VITAMIN D PO    Take 2 tablets by mouth daily.    HYDROXYPROPYL METHYLCELLULOSE / HYPROMELLOSE (ISOPTO TEARS / GONIOVISC) 2.5 % OPHTHALMIC SOLUTION    Place 1 drop into both eyes 4 (four) times daily as needed for dry eyes.   MULTIPLE VITAMIN (MULTIVITAMIN WITH MINERALS) TABS    Take 1 tablet by mouth daily.   NITROGLYCERIN (NITROSTAT) 0.4 MG SL TABLET    Place 1 tablet (0.4 mg total) under the tongue every 5 (five) minutes x 3 doses as needed for chest pain.   OMEGA-3 FATTY ACIDS (OMEGA 3 PO)    Take 1 gel cap daily   PANTOPRAZOLE (PROTONIX) 40 MG TABLET    Take 1  tablet (40 mg total) by mouth daily.   RANITIDINE (ZANTAC) 150 MG TABLET    Take 150 mg by mouth 2 (two) times daily.   SIMETHICONE (GAS RELIEF) 125 MG CAPS    Take 1 each by mouth 4 (four) times daily as needed (GAS). As needed for gas relief   TRAMADOL (ULTRAM) 50 MG TABLET    Take 1 tablet (50 mg total) by mouth every 6 (six) hours as needed.  Modified Medications   Modified Medication Previous Medication   LEVOTHYROXINE (SYNTHROID, LEVOTHROID) 50 MCG TABLET levothyroxine (SYNTHROID, LEVOTHROID) 50 MCG tablet      Take 1 tablet (50 mcg total) by mouth daily.    Take 1 tablet (50 mcg total) by mouth daily.   ROPINIROLE (REQUIP) 0.25 MG TABLET rOPINIRole (REQUIP) 0.25 MG tablet      TAKE 1 TABLET BY MOUTH ONCE DAILY AFTER SUPPER. (TAKE 1-2 HOURS BEFORE GOING TO BED)    TAKE 1 TABLET BY MOUTH ONCE DAILY AFTER SUPPER. (TAKE 1-2 HOURS BEFORE GOING TO BED)  Discontinued Medications   ACIDOPHILUS (RISAQUAD) CAPS CAPSULE    Take 1 capsule by mouth daily.   FLUCONAZOLE (DIFLUCAN) 100 MG TABLET    Take 1 tablet (100 mg total) by mouth daily.   MICONAZOLE (MONISTAT 7) 2 % VAGINAL CREAM    Place 1 Applicatorful vaginally at bedtime.     Physical Exam:  Filed Vitals:   04/21/16 0930  BP: 132/78  Pulse: 60  Temp: 98.1 F (36.7 C)  TempSrc: Oral  Resp: 18  Height: 5\' 1"  (1.549 m)  Weight: 186 lb 6.4 oz (84.55 kg)   Body mass index is 35.24 kg/(m^2).  Physical Exam  Constitutional: She is oriented to person, place, and time. She appears well-developed and well-nourished. No distress.  Cardiovascular: Normal rate, regular rhythm and normal heart sounds.   Pulmonary/Chest: Effort normal and breath sounds normal.  Abdominal: Soft. Bowel sounds are normal. She exhibits no distension. There is no tenderness.  Musculoskeletal: Normal range of motion. She exhibits no edema.  Neurological: She is alert and oriented to person, place, and time. She displays normal reflexes. Coordination normal.    Skin: Skin is warm and dry. She is not diaphoretic.  Psychiatric: She has a normal mood and affect.    Labs reviewed: Basic Metabolic Panel:  Recent Labs  10/19/15 0806 01/29/16 0105  NA 145* 142  K 3.9 3.9  CL 106 107  CO2 23 26  GLUCOSE 85 107*  BUN 14 13  CREATININE 1.07* 1.08*  CALCIUM 9.4 9.4  TSH 0.758  --    Liver Function Tests:  Recent Labs  10/19/15  0806  AST 23  ALT 10  ALKPHOS 89  BILITOT 0.4  PROT 6.1  ALBUMIN 3.8   No results for input(s): LIPASE, AMYLASE in the last 8760 hours. No results for input(s): AMMONIA in the last 8760 hours. CBC:  Recent Labs  10/19/15 0806 01/29/16 0105  WBC 3.5 5.5  NEUTROABS 1.3* 2.3  HGB  --  11.9*  HCT 35.5 38.2  MCV 92 95.3  PLT 242 233   Lipid Panel:  Recent Labs  10/19/15 0806  CHOL 206*  HDL 91  LDLCALC 105*  TRIG 50  CHOLHDL 2.3   TSH:  Recent Labs  10/19/15 0806  TSH 0.758   A1C: Lab Results  Component Value Date   HGBA1C 5.6 07/05/2014     Assessment/Plan 1. Dysuria -ongoing, has appt scheduled with urologist  - POC Urinalysis Dipstick done and normal  2. Gastroesophageal reflux disease, esophagitis presence not specified -stable, conts on protonix - CBC with Differential/Platelets; Future  3. Constipation, unspecified constipation type -improved  4. Other specified hypothyroidism -TSH stable in December, conts on synthroid  - TSH; Future  5. Vaginal dryness -may use vaginal lubricant to help with dryness and dysuria   6. Hyperlipidemia -cont diet modifications and exercise - Lipid panel; Future - Comprehensive metabolic panel; Future  To follow up in 6 months with fasting labs prior to visit  Colleen [name]  [facility] [phone] 8 am - 5 pm) [phone] (after hours)

## 2016-05-19 DIAGNOSIS — R300 Dysuria: Secondary | ICD-10-CM | POA: Diagnosis not present

## 2016-05-19 DIAGNOSIS — N952 Postmenopausal atrophic vaginitis: Secondary | ICD-10-CM | POA: Diagnosis not present

## 2016-06-14 DIAGNOSIS — R102 Pelvic and perineal pain: Secondary | ICD-10-CM | POA: Diagnosis not present

## 2016-06-14 DIAGNOSIS — N952 Postmenopausal atrophic vaginitis: Secondary | ICD-10-CM | POA: Diagnosis not present

## 2016-06-23 DIAGNOSIS — R300 Dysuria: Secondary | ICD-10-CM | POA: Diagnosis not present

## 2016-06-23 DIAGNOSIS — R8290 Unspecified abnormal findings in urine: Secondary | ICD-10-CM | POA: Diagnosis not present

## 2016-06-23 DIAGNOSIS — R350 Frequency of micturition: Secondary | ICD-10-CM | POA: Diagnosis not present

## 2016-07-21 ENCOUNTER — Other Ambulatory Visit: Payer: Self-pay | Admitting: Internal Medicine

## 2016-07-21 DIAGNOSIS — K219 Gastro-esophageal reflux disease without esophagitis: Secondary | ICD-10-CM

## 2016-07-22 ENCOUNTER — Other Ambulatory Visit: Payer: Self-pay | Admitting: Internal Medicine

## 2016-07-22 DIAGNOSIS — K219 Gastro-esophageal reflux disease without esophagitis: Secondary | ICD-10-CM

## 2016-08-04 DIAGNOSIS — Z23 Encounter for immunization: Secondary | ICD-10-CM | POA: Diagnosis not present

## 2016-08-23 DIAGNOSIS — N390 Urinary tract infection, site not specified: Secondary | ICD-10-CM | POA: Diagnosis not present

## 2016-08-23 DIAGNOSIS — R300 Dysuria: Secondary | ICD-10-CM | POA: Diagnosis not present

## 2016-10-13 ENCOUNTER — Other Ambulatory Visit: Payer: Self-pay

## 2016-10-13 DIAGNOSIS — K219 Gastro-esophageal reflux disease without esophagitis: Secondary | ICD-10-CM

## 2016-10-13 DIAGNOSIS — K449 Diaphragmatic hernia without obstruction or gangrene: Secondary | ICD-10-CM

## 2016-10-13 DIAGNOSIS — E785 Hyperlipidemia, unspecified: Secondary | ICD-10-CM

## 2016-10-13 DIAGNOSIS — E038 Other specified hypothyroidism: Secondary | ICD-10-CM

## 2016-10-20 ENCOUNTER — Ambulatory Visit (INDEPENDENT_AMBULATORY_CARE_PROVIDER_SITE_OTHER): Payer: Medicare Other

## 2016-10-20 ENCOUNTER — Other Ambulatory Visit: Payer: Medicare Other

## 2016-10-20 VITALS — BP 148/66 | HR 76 | Temp 97.7°F | Ht 61.0 in | Wt 187.2 lb

## 2016-10-20 DIAGNOSIS — E038 Other specified hypothyroidism: Secondary | ICD-10-CM

## 2016-10-20 DIAGNOSIS — K449 Diaphragmatic hernia without obstruction or gangrene: Secondary | ICD-10-CM | POA: Diagnosis not present

## 2016-10-20 DIAGNOSIS — K219 Gastro-esophageal reflux disease without esophagitis: Secondary | ICD-10-CM | POA: Diagnosis not present

## 2016-10-20 DIAGNOSIS — E785 Hyperlipidemia, unspecified: Secondary | ICD-10-CM | POA: Diagnosis not present

## 2016-10-20 DIAGNOSIS — Z0000 Encounter for general adult medical examination without abnormal findings: Secondary | ICD-10-CM | POA: Diagnosis not present

## 2016-10-20 LAB — CBC WITH DIFFERENTIAL/PLATELET
Basophils Absolute: 38 cells/uL (ref 0–200)
Basophils Relative: 1 %
Eosinophils Absolute: 38 cells/uL (ref 15–500)
Eosinophils Relative: 1 %
HCT: 40.3 % (ref 35.0–45.0)
Hemoglobin: 12.8 g/dL (ref 11.7–15.5)
Lymphocytes Relative: 60 %
Lymphs Abs: 2280 cells/uL (ref 850–3900)
MCH: 29.9 pg (ref 27.0–33.0)
MCHC: 31.8 g/dL — ABNORMAL LOW (ref 32.0–36.0)
MCV: 94.2 fL (ref 80.0–100.0)
MPV: 12.4 fL (ref 7.5–12.5)
Monocytes Absolute: 190 cells/uL — ABNORMAL LOW (ref 200–950)
Monocytes Relative: 5 %
Neutro Abs: 1254 cells/uL — ABNORMAL LOW (ref 1500–7800)
Neutrophils Relative %: 33 %
Platelets: 259 10*3/uL (ref 140–400)
RBC: 4.28 MIL/uL (ref 3.80–5.10)
RDW: 13.4 % (ref 11.0–15.0)
WBC: 3.8 10*3/uL (ref 3.8–10.8)

## 2016-10-20 LAB — COMPLETE METABOLIC PANEL WITH GFR
ALT: 12 U/L (ref 6–29)
AST: 28 U/L (ref 10–35)
Albumin: 4 g/dL (ref 3.6–5.1)
Alkaline Phosphatase: 88 U/L (ref 33–130)
BUN: 16 mg/dL (ref 7–25)
CO2: 25 mmol/L (ref 20–31)
Calcium: 9.6 mg/dL (ref 8.6–10.4)
Chloride: 107 mmol/L (ref 98–110)
Creat: 1.05 mg/dL — ABNORMAL HIGH (ref 0.60–0.93)
GFR, Est African American: 61 mL/min (ref >=60)
GFR, Est Non African American: 53 mL/min — ABNORMAL LOW (ref >=60)
Glucose, Bld: 99 mg/dL (ref 65–99)
Potassium: 4.4 mmol/L (ref 3.5–5.3)
Sodium: 143 mmol/L (ref 135–146)
Total Bilirubin: 0.5 mg/dL (ref 0.2–1.2)
Total Protein: 6.7 g/dL (ref 6.1–8.1)

## 2016-10-20 LAB — LIPID PANEL
Cholesterol: 215 mg/dL — ABNORMAL HIGH (ref <200)
HDL: 99 mg/dL (ref >50)
LDL Cholesterol: 103 mg/dL — ABNORMAL HIGH (ref <100)
Total CHOL/HDL Ratio: 2.2 Ratio (ref <5.0)
Triglycerides: 67 mg/dL (ref <150)
VLDL: 13 mg/dL (ref <30)

## 2016-10-20 LAB — TSH: TSH: 0.65 mIU/L

## 2016-10-20 NOTE — Progress Notes (Signed)
Quick Notes   Health Maintenance:   Up to date on health maintenance   Abnormal Screen:  None; MMSE-30/30 Passed Clock Test   Patient Concerns:  Pt still having lower back pain and left shoulder pain. Still having a burning sensation w/ voiding. Sees urologist in Jan.     Nurse Concerns:  None

## 2016-10-20 NOTE — Patient Instructions (Addendum)
Colleen [name] , Thank you for taking time to come for your Medicare Wellness Visit. I appreciate your ongoing commitment to your health goals. Please review the following plan we discussed and let me know if I can assist you in the future.   These are the goals we discussed: Goals    . Weight (lb) < 170 lb (77.1 kg)          Starting 10/20/16, I will attempt to decrease my weight to get to 170 lbs.       This is a list of the screening recommended for you and due dates:  Health Maintenance  Topic Date Due  . Mammogram  12/28/2017  . Colon Cancer Screening  02/16/2022  . Tetanus Vaccine  10/21/2025  . Flu Shot  Completed  . DEXA scan (bone density measurement)  Completed  . Shingles Vaccine  Completed  .  Hepatitis C: One time screening is recommended by Center for Disease Control  (CDC) for  adults born from [birth year] through 1967.   Completed  . Pneumonia vaccines  Completed  Preventive Care for Adults  A healthy lifestyle and preventive care can promote health and wellness. Preventive health guidelines for adults include the following key practices.  . A routine yearly physical is a good way to check with your health care provider about your health and preventive screening. It is a chance to share any concerns and updates on your health and to receive a thorough exam.  . Visit your dentist for a routine exam and preventive care every 6 months. Brush your teeth twice a day and floss once a day. Good oral hygiene prevents tooth decay and gum disease.  . The frequency of eye exams is based on your age, health, family medical history, use  of contact lenses, and other factors. Follow your health care provider's ecommendations for frequency of eye exams.  . Eat a healthy diet. Foods like vegetables, fruits, whole grains, low-fat dairy products, and lean protein foods contain the nutrients you need without too many calories. Decrease your intake of foods high in solid fats, added sugars, and  salt. Eat the right amount of calories for you. Get information about a proper diet from your health care provider, if necessary.  . Regular physical exercise is one of the most important things you can do for your health. Most adults should get at least 150 minutes of moderate-intensity exercise (any activity that increases your heart rate and causes you to sweat) each week. In addition, most adults need muscle-strengthening exercises on 2 or more days a week.  Silver Sneakers may be a benefit available to you. To determine eligibility, you may visit the website: www.silversneakers.com or contact program at (985) 490-6484 Mon-Fri between 8AM-8PM.   . Maintain a healthy weight. The body mass index (BMI) is a screening tool to identify possible weight problems. It provides an estimate of body fat based on height and weight. Your health care provider can find your BMI and can help you achieve or maintain a healthy weight.   For adults 20 years and older: ? A BMI below 18.5 is considered underweight. ? A BMI of 18.5 to 24.9 is normal. ? A BMI of 25 to 29.9 is considered overweight. ? A BMI of 30 and above is considered obese.   . Maintain normal blood lipids and cholesterol levels by exercising and minimizing your intake of saturated fat. Eat a balanced diet with plenty of fruit and vegetables. Blood tests for lipids  and cholesterol should begin at age 44 and be repeated every 5 years. If your lipid or cholesterol levels are high, you are over 50, or you are at high risk for heart disease, you may need your cholesterol levels checked more frequently. Ongoing high lipid and cholesterol levels should be treated with medicines if diet and exercise are not working.  . If you smoke, find out from your health care provider how to quit. If you do not use tobacco, please do not start.  . If you choose to drink alcohol, please do not consume more than 2 drinks per day. One drink is considered to be 12 ounces  (355 mL) of beer, 5 ounces (148 mL) of wine, or 1.5 ounces (44 mL) of liquor.  . If you are 61-59 years old, ask your health care provider if you should take aspirin to prevent strokes.  . Use sunscreen. Apply sunscreen liberally and repeatedly throughout the day. You should seek shade when your shadow is shorter than you. Protect yourself by wearing long sleeves, pants, a wide-brimmed hat, and sunglasses year round, whenever you are outdoors.  . Once a month, do a whole body skin exam, using a mirror to look at the skin on your back. Tell your health care provider of new moles, moles that have irregular borders, moles that are larger than a pencil eraser, or moles that have changed in shape or color.

## 2016-10-20 NOTE — Progress Notes (Signed)
Subjective:   Colleen [name] is a 70 y.o. female who presents for Medicare Annual (Subsequent) preventive examination.  Review of Systems:  Cardiac Risk Factors include: advanced age (>56men, >30 women);family history of premature cardiovascular disease;hypertension     Objective:     Vitals: BP (!) 148/66 (BP Location: Left Arm, Patient Position: Sitting, Cuff Size: Normal)   Pulse 76   Temp 97.7 F (36.5 C) (Oral)   Ht 5\' 1"  (1.549 m)   Wt 187 lb 3.2 oz (84.9 kg)   SpO2 99%   BMI 35.37 kg/m   Body mass index is 35.37 kg/m.   Tobacco History  Smoking Status  . Former Smoker  . Types: Cigarettes  . Quit date: 10/31/1986  Smokeless Tobacco  . Never Used     Counseling given: No   Past Medical History:  Diagnosis Date  . Allergic rhinitis   . Anxiety   . Arthritis   . Colon polyps    adenomatous  . Corns and callosities   . Diverticulosis   . GERD (gastroesophageal reflux disease)   . Glaucoma   . Hypercholesteremia   . Hypothyroidism   . Obesity   . Screening for diabetes mellitus   . Urge incontinence    Past Surgical History:  Procedure Laterality Date  . ABDOMINAL HYSTERECTOMY  1985   Dr Mancel Bale  . BUNIONECTOMY Right    Dr Jomarie Longs  . CARPAL TUNNEL RELEASE Bilateral 2010   Dr Laurance Flatten  . CESAREAN SECTION  1983  . COLONOSCOPY    . ROTATOR CUFF REPAIR Right    Dr Theda Sers  . THYROIDECTOMY  1987   Dr Guadlupe Spanish  . TONSILLECTOMY  1951   Family History  Problem Relation Age of Onset  . Leukemia Mother 23  . Colon cancer Mother   . Lung cancer Father   . Diabetes Brother   . COPD Brother   . Lung cancer Brother   . Diabetes Maternal Grandmother   . Cancer Maternal Grandfather     unknown type   History  Sexual Activity  . Sexual activity: No    Outpatient Encounter Prescriptions as of 10/20/2016  Medication Sig  . acetaminophen (TYLENOL) 500 MG tablet Take 1,000 mg by mouth every 6 (six) hours as needed for mild pain.  . Ascorbic Acid  (VITAMIN C PO) Take 1 tablet by mouth daily.  Marland Kitchen aspirin 81 MG tablet Take 81 mg by mouth daily.  Marland Kitchen CALCIUM-VITAMIN D PO Take 2 tablets by mouth daily.   . hydroxypropyl methylcellulose / hypromellose (ISOPTO TEARS / GONIOVISC) 2.5 % ophthalmic solution Place 1 drop into both eyes 4 (four) times daily as needed for dry eyes.  Marland Kitchen levothyroxine (SYNTHROID, LEVOTHROID) 50 MCG tablet Take 1 tablet (50 mcg total) by mouth daily.  . Multiple Vitamin (MULTIVITAMIN WITH MINERALS) TABS Take 1 tablet by mouth daily.  . nitroGLYCERIN (NITROSTAT) 0.4 MG SL tablet Place 1 tablet (0.4 mg total) under the tongue every 5 (five) minutes x 3 doses as needed for chest pain.  . Omega-3 Fatty Acids (OMEGA 3 PO) Take 1 gel cap daily  . pantoprazole (PROTONIX) 40 MG tablet take 1 tablet by mouth once daily  . ranitidine (ZANTAC) 150 MG tablet Take 150 mg by mouth 2 (two) times daily.  Marland Kitchen rOPINIRole (REQUIP) 0.25 MG tablet TAKE 1 TABLET BY MOUTH ONCE DAILY AFTER SUPPER. (TAKE 1-2 HOURS BEFORE GOING TO BED)  . Simethicone (GAS RELIEF) 125 MG CAPS Take 1 each by mouth 4 (  four) times daily as needed (GAS). As needed for gas relief  . traMADol (ULTRAM) 50 MG tablet Take 1 tablet (50 mg total) by mouth every 6 (six) hours as needed.  . [DISCONTINUED] pantoprazole (PROTONIX) 40 MG tablet take 1 tablet by mouth once daily   No facility-administered encounter medications on file as of 10/20/2016.     Activities of Daily Living In your present state of health, do you have any difficulty performing the following activities: 10/20/2016 10/22/2015  Hearing? N N  Vision? N N  Difficulty concentrating or making decisions? Y N  Walking or climbing stairs? N N  Dressing or bathing? N N  Doing errands, shopping? N N  Preparing Food and eating ? N -  Using the Toilet? N -  In the past six months, have you accidently leaked urine? Y -  Do you have problems with loss of bowel control? N -  Managing your Medications? N -  Managing  your Finances? N -  Housekeeping or managing your Housekeeping? N -  Some recent data might be hidden    Patient Care Team: [name], NP as PCP - General (Nurse Practitioner)    Assessment:    Exercise Activities and Dietary recommendations Current Exercise Habits: Home exercise routine, Type of exercise: Other - see comments;walking (Exercise bike ), Time (Minutes): 25, Frequency (Times/Week): 2, Weekly Exercise (Minutes/Week): 50, Intensity: Mild, Exercise limited by: None identified  Goals    . Weight (lb) < 170 lb (77.1 kg)          Starting 10/20/16, I will attempt to decrease my weight to get to 170 lbs.      Fall Risk Fall Risk  10/20/2016 04/21/2016 02/24/2016 12/31/2015 12/03/2015  Falls in the past year? No No No No No   Depression Screen PHQ 2/9 Scores 10/20/2016 04/21/2016 10/22/2015 12/23/2014  PHQ - 2 Score 0 0 0 0     Hearing Screening   125Hz  250Hz  500Hz  1000Hz  2000Hz  3000Hz  4000Hz  6000Hz  8000Hz   Right ear:   100 100 100  100    Left ear:   0 0 100  100    Comments: Pt has not had a hearing screen in several years. Denies any problems with hearing.   Vision Screening Comments: Last eye exam done in July 2017 at [facility] with Dr. Maryjane Hurter.   Cognitive Function MMSE - Mini Mental State Exam 10/20/2016 10/22/2015 07/08/2014  Not completed: - (No Data) -  Orientation to time 5 5 5   Orientation to Place 5 5 5   Registration 3 3 3   Attention/ Calculation 5 5 5   Recall 3 3 2   Language- name 2 objects 2 2 2   Language- repeat 1 1 1   Language- follow 3 step command 3 2 3   Language- read & follow direction 1 1 1   Write a sentence 1 1 1   Copy design 1 1 1   Total score 30 29 29         Immunization History  Administered Date(s) Administered  . Influenza,inj,Quad PF,36+ Mos 07/08/2014  . Influenza-Unspecified 08/09/2012, 08/01/2013, 06/30/2015, 08/04/2016  . Pneumococcal Conjugate-13 11/03/2014  . Pneumococcal Polysaccharide-23 02/19/2013  . Td  10/31/2000  . Tdap 10/22/2015  . Varicella 06/15/2011  . Zoster 10/31/2010   Screening Tests Health Maintenance  Topic Date Due  . MAMMOGRAM  12/28/2017  . COLONOSCOPY  02/16/2022  . TETANUS/TDAP  10/21/2025  . INFLUENZA VACCINE  Completed  . DEXA SCAN  Completed  . ZOSTAVAX  Completed  . Hepatitis C Screening  Completed  . PNA vac Low Risk Adult  Completed      Plan:    I have personally reviewed and addressed the Medicare Annual Wellness questionnaire and have noted the following in the patient's chart:  A. Medical and social history B. Use of alcohol, tobacco or illicit drugs  C. Current medications and supplements D. Functional ability and status E.  Nutritional status F.  Physical activity G. Advance directives H. List of other physicians I.  Hospitalizations, surgeries, and ER visits in previous 12 months J.  [place] to include hearing, vision, cognitive, depression L. Referrals and appointments - none  In addition, I have reviewed and discussed with patient certain preventive protocols, quality metrics, and best practice recommendations. A written personalized care plan for preventive services as well as general preventive health recommendations were provided to patient.  See attached scanned questionnaire for additional information.   Signed,   Allyn Kenner, LPN Health Advisor   I reviewed health advisors note, was available for consultation and agree with documentation and plan.  Carlos American. Harle Battiest  [facility] [phone] 8 am - 5 pm) [phone] (after hours)

## 2016-10-21 ENCOUNTER — Other Ambulatory Visit: Payer: Self-pay

## 2016-10-27 ENCOUNTER — Ambulatory Visit: Payer: Self-pay

## 2016-10-27 ENCOUNTER — Ambulatory Visit (INDEPENDENT_AMBULATORY_CARE_PROVIDER_SITE_OTHER): Payer: Medicare Other | Admitting: Nurse Practitioner

## 2016-10-27 ENCOUNTER — Encounter: Payer: Self-pay | Admitting: Nurse Practitioner

## 2016-10-27 VITALS — BP 142/76 | HR 69 | Temp 97.9°F | Resp 18 | Ht 61.0 in | Wt 187.2 lb

## 2016-10-27 DIAGNOSIS — E785 Hyperlipidemia, unspecified: Secondary | ICD-10-CM | POA: Diagnosis not present

## 2016-10-27 DIAGNOSIS — E2839 Other primary ovarian failure: Secondary | ICD-10-CM | POA: Diagnosis not present

## 2016-10-27 DIAGNOSIS — M67912 Unspecified disorder of synovium and tendon, left shoulder: Secondary | ICD-10-CM

## 2016-10-27 DIAGNOSIS — K219 Gastro-esophageal reflux disease without esophagitis: Secondary | ICD-10-CM | POA: Diagnosis not present

## 2016-10-27 DIAGNOSIS — G8929 Other chronic pain: Secondary | ICD-10-CM | POA: Diagnosis not present

## 2016-10-27 DIAGNOSIS — M545 Low back pain, unspecified: Secondary | ICD-10-CM

## 2016-10-27 DIAGNOSIS — R300 Dysuria: Secondary | ICD-10-CM

## 2016-10-27 DIAGNOSIS — E039 Hypothyroidism, unspecified: Secondary | ICD-10-CM | POA: Diagnosis not present

## 2016-10-27 DIAGNOSIS — N898 Other specified noninflammatory disorders of vagina: Secondary | ICD-10-CM | POA: Diagnosis not present

## 2016-10-27 DIAGNOSIS — R030 Elevated blood-pressure reading, without diagnosis of hypertension: Secondary | ICD-10-CM | POA: Diagnosis not present

## 2016-10-27 MED ORDER — LEVOTHYROXINE SODIUM 50 MCG PO TABS: 50.0000 ug | ORAL_TABLET | Freq: Every day | ORAL | 1 refills | Status: DC

## 2016-10-27 MED ORDER — TRAMADOL HCL 50 MG PO TABS: 50.0000 mg | ORAL_TABLET | Freq: Four times a day (QID) | ORAL | 2 refills | Status: DC | PRN

## 2016-10-27 MED ORDER — ROPINIROLE HCL 0.25 MG PO TABS: ORAL_TABLET | ORAL | 1 refills | Status: DC

## 2016-10-27 NOTE — Patient Instructions (Addendum)
Take random blood pressure after sitting for ~5 mins and record Bring recordings to next visit     [place] stands for "Dietary Approaches to Stop Hypertension." The DASH eating plan is a healthy eating plan that has been shown to reduce high blood pressure (hypertension). Additional health benefits may include reducing the risk of type 2 diabetes mellitus, heart disease, and stroke. The DASH eating plan may also help with weight loss. What do I need to know about the DASH eating plan? For the DASH eating plan, you will follow these general guidelines:  Choose foods with less than 150 milligrams of sodium per serving (as listed on the food label).  Use salt-free seasonings or herbs instead of table salt or sea salt.  Check with your health care provider or pharmacist before using salt substitutes.  Eat lower-sodium products. These are often labeled as "low-sodium" or "no salt added."  Eat fresh foods. Avoid eating a lot of canned foods.  Eat more vegetables, fruits, and low-fat dairy products.  Choose whole grains. Look for the word "whole" as the first word in the ingredient list.  Choose fish and skinless chicken or [place] more often than red meat. Limit fish, poultry, and meat to 6 oz (170 g) each day.  Limit sweets, desserts, sugars, and sugary drinks.  Choose heart-healthy fats.  Eat more home-cooked food and less restaurant, buffet, and fast food.  Limit fried foods.  Do not fry foods. Cook foods using methods such as baking, boiling, grilling, and broiling instead.  When eating at a restaurant, ask that your food be prepared with less salt, or no salt if possible. What foods can I eat? Seek help from a dietitian for individual calorie needs. Grains  Whole grain or whole wheat bread. Brown rice. Whole grain or whole wheat pasta. Quinoa, bulgur, and whole grain cereals. Low-sodium cereals. Corn or whole wheat flour tortillas. Whole grain cornbread. Whole  grain crackers. Low-sodium crackers. Vegetables  Fresh or frozen vegetables (raw, steamed, roasted, or grilled). Low-sodium or reduced-sodium tomato and vegetable juices. Low-sodium or reduced-sodium tomato sauce and paste. Low-sodium or reduced-sodium canned vegetables. Fruits  All fresh, canned (in natural juice), or frozen fruits. Meat and Other Protein Products  Ground beef (85% or leaner), grass-fed beef, or beef trimmed of fat. Skinless chicken or [place]. Ground chicken or [place]. Pork trimmed of fat. All fish and seafood. Eggs. Dried beans, peas, or lentils. Unsalted nuts and seeds. Unsalted canned beans. Dairy  Low-fat dairy products, such as skim or 1% milk, 2% or reduced-fat cheeses, low-fat ricotta or cottage cheese, or plain low-fat yogurt. Low-sodium or reduced-sodium cheeses. Fats and Oils  Tub margarines without trans fats. Light or reduced-fat mayonnaise and salad dressings (reduced sodium). Avocado. Safflower, olive, or canola oils. Natural peanut or almond butter. Other  Unsalted popcorn and pretzels. The items listed above may not be a complete list of recommended foods or beverages. Contact your dietitian for more options.  What foods are not recommended? Grains  White bread. White pasta. White rice. Refined cornbread. Bagels and croissants. Crackers that contain trans fat. Vegetables  Creamed or fried vegetables. Vegetables in a cheese sauce. Regular canned vegetables. Regular canned tomato sauce and paste. Regular tomato and vegetable juices. Fruits  Canned fruit in light or heavy syrup. Fruit juice. Meat and Other Protein Products  Fatty cuts of meat. Ribs, chicken wings, bacon, sausage, bologna, salami, chitterlings, fatback, hot dogs, bratwurst, and packaged luncheon meats. Salted nuts and seeds. Canned  beans with salt. Dairy  Whole or 2% milk, cream, half-and-half, and cream cheese. Whole-fat or sweetened yogurt. Full-fat cheeses or blue cheese. Nondairy creamers and  whipped toppings. Processed cheese, cheese spreads, or cheese curds. Condiments  Onion and garlic salt, seasoned salt, table salt, and sea salt. Canned and packaged gravies. Worcestershire sauce. Tartar sauce. Barbecue sauce. Teriyaki sauce. Soy sauce, including reduced sodium. Steak sauce. Fish sauce. Oyster sauce. Cocktail sauce. Horseradish. Ketchup and mustard. Meat flavorings and tenderizers. Bouillon cubes. Hot sauce. Tabasco sauce. Marinades. Taco seasonings. Relishes. Fats and Oils  Butter, stick margarine, lard, shortening, ghee, and bacon fat. Coconut, palm kernel, or palm oils. Regular salad dressings. Other  Pickles and olives. Salted popcorn and pretzels. The items listed above may not be a complete list of foods and beverages to avoid. Contact your dietitian for more information.  Where can I find more information? National Heart, Lung, and Blood Institute: travelstabloid.com This information is not intended to replace advice given to you by your health care provider. Make sure you discuss any questions you have with your health care provider. Document Released: 10/06/2011 Document Revised: 03/24/2016 Document Reviewed: 08/21/2013 Elsevier Interactive Patient Education  2017 [employer].

## 2016-10-27 NOTE — Progress Notes (Signed)
[name]: Patient Care Team: [name], NP as PCP - General (Nurse Practitioner)  Advanced Directive information Does Patient Have a Medical Advance Directive?: Yes, Type of Advance Directive: Healthcare Power of Attorney  No Known Allergies  Chief Complaint  Patient presents with  . Medical Management of Chronic Issues    6 month follow up for medication management  . Medication Management    Needs refills on tramadol, requip, synthroid      HPI: Patient is a 70 y.o. female seen in the office today for a routine follow-up.   Patient reports she previously had right rotator cuff surgery. She now feels like her left rotator cuff is beginning to bother her. She has full ROM in the left shoulder, at times she has some pain with movement. Pain is worse at night when she rolls over on that shoulder. She will follow-up with her orthopedic surgeon when it gets worse as she states she does not want to have surgery just yet.   The patient reports her back pain is starting back up again. She previously had PT which was helpful and is currently taking Tramadol PRN for the pain. Only needing the Tramadol maybe twice a week. She states it is not bad yet. She rates the pain a 2/10 when she is hurting. She states she does not need any further intervention at this time but just wanted to make aware that the pain is returning. The patient has resumed exercises she was doing with PT and states the part of PT that helped the most was the dry needles. Upon further questioning dry needles were found to be acupuncture. Pain is aggravated by exceeding 20 minutes on the stationary bike.   Since her last visit she has seen her urologist, Dr. Alona Bene, numerous times. She has been placed on a prophylactic low-dose antibiotic due to cystitis and estrace for atrophic vaginitis. Patient reports the burning with urination is "much much much better".  Denies any trouble with GERD. Avoids triggers.    Has occasional constipation if she does not eat enough fruits. Milk of magnesia is effective for her.   Review of Systems:  Review of Systems  Constitutional: Negative for activity change, appetite change, fatigue and unexpected weight change.  HENT: Negative for congestion and hearing loss.   Eyes: Negative.   Respiratory: Negative for cough and shortness of breath.   Cardiovascular: Negative for chest pain, palpitations and leg swelling.  Gastrointestinal: Positive for constipation (occasionally). Negative for abdominal pain and diarrhea.  Genitourinary: Positive for dysuria (much improved). Negative for difficulty urinating, flank pain, frequency and hematuria.  Musculoskeletal: Positive for back pain. Negative for arthralgias and myalgias.  Skin: Negative for color change and wound.  Neurological: Negative for dizziness and weakness.  Psychiatric/Behavioral: Negative for agitation, behavioral problems and confusion.    Past Medical History:  Diagnosis Date  . Allergic rhinitis   . Anxiety   . Arthritis   . Colon polyps    adenomatous  . Corns and callosities   . Diverticulosis   . GERD (gastroesophageal reflux disease)   . Glaucoma   . Hypercholesteremia   . Hypothyroidism   . Obesity   . Screening for diabetes mellitus   . Urge incontinence    Past Surgical History:  Procedure Laterality Date  . ABDOMINAL HYSTERECTOMY  1985   Dr Mancel Bale  . BUNIONECTOMY Right    Dr Jomarie Longs  . CARPAL TUNNEL RELEASE Bilateral 2010   Dr Laurance Flatten  .  CESAREAN SECTION  1983  . COLONOSCOPY    . ROTATOR CUFF REPAIR Right    Dr Theda Sers  . THYROIDECTOMY  1987   Dr Guadlupe Spanish  . TONSILLECTOMY  1951   Social History:   reports that she quit smoking about 30 years ago. Her smoking use included Cigarettes. She has never used smokeless tobacco. She reports that she does not drink alcohol or use drugs.  Family History  Problem Relation Age of Onset  . Leukemia Mother 61  . Colon cancer  Mother   . Lung cancer Father   . Diabetes Brother   . COPD Brother   . Lung cancer Brother   . Diabetes Maternal Grandmother   . Cancer Maternal Grandfather     unknown type    Medications: Patient's Medications  New Prescriptions   No medications on file  Previous Medications   ACETAMINOPHEN (TYLENOL) 500 MG TABLET    Take 1,000 mg by mouth every 6 (six) hours as needed for mild pain.   ASCORBIC ACID (VITAMIN C PO)    Take 1 tablet by mouth daily.   ASPIRIN 81 MG TABLET    Take 81 mg by mouth daily.   CALCIUM-VITAMIN D PO    Take 2 tablets by mouth daily.    CEPHALEXIN (KEFLEX) 500 MG CAPSULE    Take 500 mg by mouth daily.   HYDROXYPROPYL METHYLCELLULOSE / HYPROMELLOSE (ISOPTO TEARS / GONIOVISC) 2.5 % OPHTHALMIC SOLUTION    Place 1 drop into both eyes 4 (four) times daily as needed for dry eyes.   LEVOTHYROXINE (SYNTHROID, LEVOTHROID) 50 MCG TABLET    Take 1 tablet (50 mcg total) by mouth daily.   MULTIPLE VITAMIN (MULTIVITAMIN WITH MINERALS) TABS    Take 1 tablet by mouth daily.   NITROGLYCERIN (NITROSTAT) 0.4 MG SL TABLET    Place 1 tablet (0.4 mg total) under the tongue every 5 (five) minutes x 3 doses as needed for chest pain.   OMEGA-3 FATTY ACIDS (OMEGA 3 PO)    Take 1 gel cap daily   PANTOPRAZOLE (PROTONIX) 40 MG TABLET    take 1 tablet by mouth once daily   ROPINIROLE (REQUIP) 0.25 MG TABLET    TAKE 1 TABLET BY MOUTH ONCE DAILY AFTER SUPPER. (TAKE 1-2 HOURS BEFORE GOING TO BED)   SIMETHICONE (GAS RELIEF) 125 MG CAPS    Take 1 each by mouth 4 (four) times daily as needed (GAS). As needed for gas relief   TRAMADOL (ULTRAM) 50 MG TABLET    Take 1 tablet (50 mg total) by mouth every 6 (six) hours as needed.  Modified Medications   No medications on file  Discontinued Medications   RANITIDINE (ZANTAC) 150 MG TABLET    Take 150 mg by mouth 2 (two) times daily.     Physical Exam:  Vitals:   10/27/16 0826  BP: (!) 142/76  Pulse: 69  Resp: 18  Temp: 97.9 F (36.6 C)   TempSrc: Oral  SpO2: 96%  Weight: 187 lb 3.2 oz (84.9 kg)  Height: 5\' 1"  (1.549 m)   Body mass index is 35.37 kg/m.  Physical Exam  Constitutional: She is oriented to person, place, and time. She appears well-developed and well-nourished. No distress.  HENT:  Head: Normocephalic and atraumatic.  Eyes: Pupils are equal, round, and reactive to light.  Neck: Normal range of motion. Neck supple.  Cardiovascular: Normal rate, regular rhythm and normal heart sounds.   Pulmonary/Chest: Effort normal and breath sounds normal.  Abdominal: Soft. Bowel sounds are normal. She exhibits no distension. There is no tenderness.  Musculoskeletal: Normal range of motion. She exhibits no edema.  Neurological: She is alert and oriented to person, place, and time. She displays normal reflexes. Coordination normal.  Skin: Skin is warm and dry. She is not diaphoretic.  Psychiatric: She has a normal mood and affect. Her behavior is normal. Judgment and thought content normal.    Labs reviewed: Basic Metabolic Panel:  Recent Labs  01/29/16 0105 10/20/16 0848  NA 142 143  K 3.9 4.4  CL 107 107  CO2 26 25  GLUCOSE 107* 99  BUN 13 16  CREATININE 1.08* 1.05*  CALCIUM 9.4 9.6  TSH  --  0.65   Liver Function Tests:  Recent Labs  10/20/16 0848  AST 28  ALT 12  ALKPHOS 88  BILITOT 0.5  PROT 6.7  ALBUMIN 4.0   No results for input(s): LIPASE, AMYLASE in the last 8760 hours. No results for input(s): AMMONIA in the last 8760 hours. CBC:  Recent Labs  01/29/16 0105 10/20/16 0848  WBC 5.5 3.8  NEUTROABS 2.3 1,254*  HGB 11.9* 12.8  HCT 38.2 40.3  MCV 95.3 94.2  PLT 233 259   Lipid Panel:  Recent Labs  10/20/16 0848  CHOL 215*  HDL 99  LDLCALC 103*  TRIG 67  CHOLHDL 2.2   TSH:  Recent Labs  10/20/16 0848  TSH 0.65   A1C: Lab Results  Component Value Date   HGBA1C 5.6 07/05/2014     Assessment/Plan 1. Dysuria - Much improved. On prophylactic Keflex once daily.   - Continue to follow-up with Dr. Amalia Hailey (urologist)  2. Gastroesophageal reflux disease, esophagitis presence not specified - Controlled with dietary modifications and Protonix.   3. Vaginal dryness - stable with Estrace cream.   4. Hyperlipidemia, unspecified hyperlipidemia type - Stable. Dietary modifications encouraged.  - Education provided on the [employer].  - LDL 103 on 12/21  5. Dysfunction of left rotator cuff - Patient will follow-up with her orthopedic surgeon when she feels she is ready to have surgery as she knows this is what she needs, since she had surgery on the right rotator cuff.  6. Chronic left-sided low back pain without sciatica - Pain beginning to return. Patient does not desire any further intervention at this time. Reports acupuncture with PT helped the most. She has resumed exercises she was doing with PT the last time her back was bothering her.  - traMADol (ULTRAM) 50 MG tablet; Take 1 tablet (50 mg total) by mouth every 6 (six) hours as needed.  Dispense: 90 tablet; Refill: 2  7. Hypothyroidism, unspecified type - Stable on current treatment.  - levothyroxine (SYNTHROID, LEVOTHROID) 50 MCG tablet; Take 1 tablet (50 mcg total) by mouth daily.  Dispense: 90 tablet; Refill: 1  8. Elevated BP  - Patient educated on the DASH diet. - To take random BP after sitting for ~5 minutes, record, and bring recordings to next visit.  - If BP remains elevated will begin treatment with medication at next visit.  - Continue exercise.   9. Estrogen deficiency -Low bone mass on dexa from 2014. conts on calcium and vit D  - DG Bone Density; Future  Follow-up in 6 months with labs prior to visit.   Carlos American. Harle Battiest  [facility] [phone] 8 am - 5 pm) [phone] (after hours)

## 2016-11-10 ENCOUNTER — Other Ambulatory Visit: Payer: Self-pay

## 2016-11-16 ENCOUNTER — Other Ambulatory Visit: Payer: Self-pay

## 2016-11-22 ENCOUNTER — Other Ambulatory Visit: Payer: Self-pay | Admitting: Nurse Practitioner

## 2016-11-22 ENCOUNTER — Ambulatory Visit
Admission: RE | Admit: 2016-11-22 | Discharge: 2016-11-22 | Disposition: A | Discharge status: Home / Self Care | Payer: Medicare Other | Source: Ambulatory Visit | Attending: Nurse Practitioner | Admitting: Nurse Practitioner

## 2016-11-22 DIAGNOSIS — E2839 Other primary ovarian failure: Secondary | ICD-10-CM

## 2016-11-22 DIAGNOSIS — Z1231 Encounter for screening mammogram for malignant neoplasm of breast: Secondary | ICD-10-CM

## 2016-11-22 DIAGNOSIS — Z780 Asymptomatic menopausal state: Secondary | ICD-10-CM | POA: Diagnosis not present

## 2016-11-22 DIAGNOSIS — M8589 Other specified disorders of bone density and structure, multiple sites: Secondary | ICD-10-CM | POA: Diagnosis not present

## 2016-11-24 DIAGNOSIS — R300 Dysuria: Secondary | ICD-10-CM | POA: Diagnosis not present

## 2016-11-25 ENCOUNTER — Other Ambulatory Visit: Payer: Self-pay | Admitting: Internal Medicine

## 2016-11-25 DIAGNOSIS — K219 Gastro-esophageal reflux disease without esophagitis: Secondary | ICD-10-CM

## 2016-12-29 ENCOUNTER — Ambulatory Visit
Admission: RE | Admit: 2016-12-29 | Discharge: 2016-12-29 | Disposition: A | Discharge status: Home / Self Care | Payer: Medicare Other | Source: Ambulatory Visit | Attending: Nurse Practitioner | Admitting: Nurse Practitioner

## 2016-12-29 DIAGNOSIS — Z1231 Encounter for screening mammogram for malignant neoplasm of breast: Secondary | ICD-10-CM

## 2017-02-07 ENCOUNTER — Other Ambulatory Visit: Payer: Self-pay

## 2017-02-07 ENCOUNTER — Telehealth: Payer: Self-pay

## 2017-02-07 DIAGNOSIS — K862 Cyst of pancreas: Secondary | ICD-10-CM

## 2017-02-07 NOTE — Telephone Encounter (Signed)
Pt scheduled for MR of abdomen at [facility] 02/14/17@10am , pt to arrive there at 9:45am. Pt to be NPO after 6am. Pt to have bun and creatinine done prior to MR, orders in epic. Pt aware.

## 2017-02-07 NOTE — Telephone Encounter (Signed)
-----   Message from [name], RN sent at 02/03/2016 10:02 AM EDT ----- Needs MRI - see results on MRI 02/02/16 Colleen [name] patient

## 2017-02-08 ENCOUNTER — Other Ambulatory Visit (INDEPENDENT_AMBULATORY_CARE_PROVIDER_SITE_OTHER): Payer: Medicare Other

## 2017-02-08 DIAGNOSIS — K862 Cyst of pancreas: Secondary | ICD-10-CM | POA: Diagnosis not present

## 2017-02-08 LAB — BUN: BUN: 18 mg/dL (ref 6–23)

## 2017-02-08 LAB — CREATININE, SERUM: Creatinine, Ser: 1.05 mg/dL (ref 0.40–1.20)

## 2017-02-14 ENCOUNTER — Ambulatory Visit (HOSPITAL_COMMUNITY)
Admission: RE | Admit: 2017-02-14 | Discharge: 2017-02-14 | Disposition: A | Discharge status: Home / Self Care | Payer: Medicare Other | Source: Ambulatory Visit | Attending: Internal Medicine | Admitting: Internal Medicine

## 2017-02-14 ENCOUNTER — Other Ambulatory Visit: Payer: Self-pay | Admitting: Internal Medicine

## 2017-02-14 DIAGNOSIS — K7689 Other specified diseases of liver: Secondary | ICD-10-CM | POA: Diagnosis not present

## 2017-02-14 DIAGNOSIS — K862 Cyst of pancreas: Secondary | ICD-10-CM

## 2017-02-14 MED ORDER — GADOBENATE DIMEGLUMINE 529 MG/ML IV SOLN
20.0000 mL | Freq: Once | INTRAVENOUS | Status: AC | PRN
  Administered 2017-02-14: 20 mL via INTRAVENOUS

## 2017-03-11 DIAGNOSIS — H04123 Dry eye syndrome of bilateral lacrimal glands: Secondary | ICD-10-CM | POA: Diagnosis not present

## 2017-03-11 DIAGNOSIS — H40033 Anatomical narrow angle, bilateral: Secondary | ICD-10-CM | POA: Diagnosis not present

## 2017-03-22 DIAGNOSIS — H578 Other specified disorders of eye and adnexa: Secondary | ICD-10-CM | POA: Diagnosis not present

## 2017-04-17 DIAGNOSIS — M25562 Pain in left knee: Secondary | ICD-10-CM | POA: Diagnosis not present

## 2017-04-17 DIAGNOSIS — M7052 Other bursitis of knee, left knee: Secondary | ICD-10-CM | POA: Diagnosis not present

## 2017-04-17 DIAGNOSIS — G8929 Other chronic pain: Secondary | ICD-10-CM | POA: Diagnosis not present

## 2017-04-24 ENCOUNTER — Other Ambulatory Visit: Payer: Medicare Other

## 2017-04-27 ENCOUNTER — Ambulatory Visit: Payer: Medicare Other | Admitting: Nurse Practitioner

## 2017-04-27 DIAGNOSIS — M7052 Other bursitis of knee, left knee: Secondary | ICD-10-CM | POA: Diagnosis not present

## 2017-05-04 DIAGNOSIS — M7052 Other bursitis of knee, left knee: Secondary | ICD-10-CM | POA: Diagnosis not present

## 2017-05-09 ENCOUNTER — Other Ambulatory Visit: Payer: Medicare Other

## 2017-05-09 DIAGNOSIS — E785 Hyperlipidemia, unspecified: Secondary | ICD-10-CM

## 2017-05-09 DIAGNOSIS — K219 Gastro-esophageal reflux disease without esophagitis: Secondary | ICD-10-CM

## 2017-05-09 LAB — CBC WITH DIFFERENTIAL/PLATELET
Basophils Absolute: 33 cells/uL (ref 0–200)
Basophils Relative: 1 %
Eosinophils Absolute: 99 cells/uL (ref 15–500)
Eosinophils Relative: 3 %
HCT: 38.6 % (ref 35.0–45.0)
Hemoglobin: 12.3 g/dL (ref 11.7–15.5)
Lymphocytes Relative: 52 %
Lymphs Abs: 1716 cells/uL (ref 850–3900)
MCH: 30.5 pg (ref 27.0–33.0)
MCHC: 31.9 g/dL — ABNORMAL LOW (ref 32.0–36.0)
MCV: 95.8 fL (ref 80.0–100.0)
MPV: 12.2 fL (ref 7.5–12.5)
Monocytes Absolute: 165 cells/uL — ABNORMAL LOW (ref 200–950)
Monocytes Relative: 5 %
Neutro Abs: 1287 cells/uL — ABNORMAL LOW (ref 1500–7800)
Neutrophils Relative %: 39 %
Platelets: 284 10*3/uL (ref 140–400)
RBC: 4.03 MIL/uL (ref 3.80–5.10)
RDW: 13.9 % (ref 11.0–15.0)
WBC: 3.3 10*3/uL — ABNORMAL LOW (ref 3.8–10.8)

## 2017-05-10 LAB — COMPLETE METABOLIC PANEL WITH GFR
ALT: 11 U/L (ref 6–29)
AST: 26 U/L (ref 10–35)
Albumin: 4.1 g/dL (ref 3.6–5.1)
Alkaline Phosphatase: 102 U/L (ref 33–130)
BUN: 17 mg/dL (ref 7–25)
CO2: 25 mmol/L (ref 20–31)
Calcium: 9.6 mg/dL (ref 8.6–10.4)
Chloride: 108 mmol/L (ref 98–110)
Creat: 1.14 mg/dL — ABNORMAL HIGH (ref 0.60–0.93)
GFR, Est African American: 55 mL/min — ABNORMAL LOW (ref >=60)
GFR, Est Non African American: 48 mL/min — ABNORMAL LOW (ref >=60)
Glucose, Bld: 87 mg/dL (ref 65–99)
Potassium: 4.5 mmol/L (ref 3.5–5.3)
Sodium: 144 mmol/L (ref 135–146)
Total Bilirubin: 0.5 mg/dL (ref 0.2–1.2)
Total Protein: 6.5 g/dL (ref 6.1–8.1)

## 2017-05-10 LAB — LIPID PANEL
Cholesterol: 211 mg/dL — ABNORMAL HIGH (ref <200)
HDL: 112 mg/dL (ref >50)
LDL Cholesterol: 90 mg/dL (ref <100)
Total CHOL/HDL Ratio: 1.9 Ratio (ref <5.0)
Triglycerides: 47 mg/dL (ref <150)
VLDL: 9 mg/dL (ref <30)

## 2017-05-12 ENCOUNTER — Encounter: Payer: Self-pay | Admitting: Nurse Practitioner

## 2017-05-12 ENCOUNTER — Ambulatory Visit (INDEPENDENT_AMBULATORY_CARE_PROVIDER_SITE_OTHER): Payer: Medicare Other | Admitting: Nurse Practitioner

## 2017-05-12 VITALS — BP 132/82 | HR 68 | Temp 97.8°F | Resp 17 | Ht 61.0 in | Wt 186.8 lb

## 2017-05-12 DIAGNOSIS — E785 Hyperlipidemia, unspecified: Secondary | ICD-10-CM | POA: Diagnosis not present

## 2017-05-12 DIAGNOSIS — E039 Hypothyroidism, unspecified: Secondary | ICD-10-CM

## 2017-05-12 DIAGNOSIS — G8929 Other chronic pain: Secondary | ICD-10-CM | POA: Diagnosis not present

## 2017-05-12 DIAGNOSIS — M545 Low back pain: Secondary | ICD-10-CM

## 2017-05-12 DIAGNOSIS — K219 Gastro-esophageal reflux disease without esophagitis: Secondary | ICD-10-CM

## 2017-05-12 DIAGNOSIS — K5900 Constipation, unspecified: Secondary | ICD-10-CM

## 2017-05-12 MED ORDER — RANITIDINE HCL 150 MG PO TABS: 150.0000 mg | ORAL_TABLET | Freq: Two times a day (BID) | ORAL | 2 refills | Status: DC

## 2017-05-12 MED ORDER — TIZANIDINE HCL 2 MG PO CAPS: 2.0000 mg | ORAL_CAPSULE | Freq: Three times a day (TID) | ORAL | 2 refills | Status: DC | PRN

## 2017-05-12 MED ORDER — TRAMADOL HCL 50 MG PO TABS: 50.0000 mg | ORAL_TABLET | Freq: Four times a day (QID) | ORAL | 0 refills | Status: DC | PRN

## 2017-05-12 NOTE — Patient Instructions (Addendum)
Use aleve 1 tablet by mouth twice daily for 1 week Heating pad 3 times daily 30 mins for 1 week routinely  Use zanaflex as needed for muscle spasm.    To stop protonix Use ranitidine by mouth twice daily for acid reflux  Food Choices for Gastroesophageal Reflux Disease, Adult When you have gastroesophageal reflux disease (GERD), the foods you eat and your eating habits are very important. Choosing the right foods can help ease your discomfort. What guidelines do I need to follow?  Choose fruits, vegetables, whole grains, and low-fat dairy products.  Choose low-fat meat, fish, and poultry.  Limit fats such as oils, salad dressings, butter, nuts, and avocado.  Keep a food diary. This helps you identify foods that cause symptoms.  Avoid foods that cause symptoms. These may be different for everyone.  Eat small meals often instead of 3 large meals a day.  Eat your meals slowly, in a place where you are relaxed.  Limit fried foods.  Cook foods using methods other than frying.  Avoid drinking alcohol.  Avoid drinking large amounts of liquids with your meals.  Avoid bending over or lying down until 2-3 hours after eating. What foods are not recommended? These are some foods and drinks that may make your symptoms worse: Vegetables Tomatoes. Tomato juice. Tomato and spaghetti sauce. Chili peppers. Onion and garlic. Horseradish. Fruits Oranges, grapefruit, and lemon (fruit and juice). Meats High-fat meats, fish, and poultry. This includes hot dogs, ribs, ham, sausage, salami, and bacon. Dairy Whole milk and chocolate milk. Sour cream. Cream. Butter. Ice cream. Cream cheese. Drinks Coffee and tea. Bubbly (carbonated) drinks or energy drinks. Condiments Hot sauce. Barbecue sauce. Sweets/Desserts Chocolate and cocoa. Donuts. Peppermint and spearmint. Fats and Oils High-fat foods. This includes [place] fries and potato chips. Other Vinegar. Strong spices. This includes black  pepper, white pepper, red pepper, cayenne, curry powder, cloves, ginger, and chili powder. The items listed above may not be a complete list of foods and drinks to avoid. Contact your dietitian for more information. This information is not intended to replace advice given to you by your health care provider. Make sure you discuss any questions you have with your health care provider. Document Released: 04/17/2012 Document Revised: 03/24/2016 Document Reviewed: 08/21/2013 Elsevier Interactive Patient Education  2017 [employer].

## 2017-05-12 NOTE — Progress Notes (Signed)
[name]: Patient Care Team: [name], NP as PCP - General (Nurse Practitioner)  Advanced Directive information Does Patient Have a Medical Advance Directive?: No  No Known Allergies  Chief Complaint  Patient presents with  . Medical Management of Chronic Issues    Pt is being seen for a 6 month routine visit.      HPI: Patient is a 71 y.o. female seen in the office today for routine follow up. Back pain is flaring up again. PT was very effective but has not been able to walk or do her bike due to the pain. Tramadol has been helping the pain. Pain for over year that comes and goes but more persistent now.  Can place her hand on spot where the pain is. acupuncture helped a lot which they did during PT. Pain radiates to groin.  Steady sharp nagging pain. 3/10 now but gets up to a 8/10 when she gets up or walk it can be debilitating   Seeing Ortho- left knee is doing much better. Using Voltaren gel which is helpful.   GERD-controlled on protonix  Constipation- controlled on OTC  Had right rotator cuff repair, left bothering her but does not want intervention at this time. Knows she can call orthopedic when she is ready. Normal ROM and strength but does not have pain.   Review of Systems:  Review of Systems  Constitutional: Negative for chills, fever and weight loss.  HENT: Negative for tinnitus.   Respiratory: Negative for cough, sputum production and shortness of breath.   Cardiovascular: Negative for chest pain, palpitations and leg swelling.  Gastrointestinal: Positive for constipation (managed by OTC) and heartburn (controlled on protonix, occasional flare). Negative for abdominal pain and diarrhea.  Genitourinary: Negative for dysuria, frequency and urgency.  Musculoskeletal: Positive for back pain, joint pain and myalgias. Negative for falls.  Skin: Negative.   Neurological: Negative for dizziness, weakness and headaches.  Psychiatric/Behavioral: Negative for  depression and memory loss. The patient does not have insomnia.     Past Medical History:  Diagnosis Date  . Allergic rhinitis   . Anxiety   . Arthritis   . Colon polyps    adenomatous  . Corns and callosities   . Diverticulosis   . GERD (gastroesophageal reflux disease)   . Glaucoma   . Hypercholesteremia   . Hypothyroidism   . Obesity   . Screening for diabetes mellitus   . Urge incontinence    Past Surgical History:  Procedure Laterality Date  . ABDOMINAL HYSTERECTOMY  1985   Dr Mancel Bale  . BUNIONECTOMY Right    Dr Jomarie Longs  . CARPAL TUNNEL RELEASE Bilateral 2010   Dr Laurance Flatten  . CESAREAN SECTION  1983  . COLONOSCOPY    . ROTATOR CUFF REPAIR Right    Dr Theda Sers  . THYROIDECTOMY  1987   Dr Guadlupe Spanish  . TONSILLECTOMY  1951   Social History:   reports that she quit smoking about 30 years ago. Her smoking use included Cigarettes. She has never used smokeless tobacco. She reports that she does not drink alcohol or use drugs.  Family History  Problem Relation Age of Onset  . Leukemia Mother 23  . Colon cancer Mother   . Lung cancer Father   . Diabetes Brother   . COPD Brother   . Lung cancer Brother   . Diabetes Maternal Grandmother   . Cancer Maternal Grandfather        unknown type    Medications:  Patient's Medications  New Prescriptions   No medications on file  Previous Medications   ACETAMINOPHEN (TYLENOL) 500 MG TABLET    Take 1,000 mg by mouth every 6 (six) hours as needed for mild pain.   ASCORBIC ACID (VITAMIN C PO)    Take 1 tablet by mouth daily.   ASPIRIN 81 MG TABLET    Take 81 mg by mouth daily.   CALCIUM-VITAMIN D PO    Take 2 tablets by mouth daily.    DICLOFENAC SODIUM (VOLTAREN) 1 % GEL    Apply 2 g topically 2 (two) times daily.   HYDROXYPROPYL METHYLCELLULOSE / HYPROMELLOSE (ISOPTO TEARS / GONIOVISC) 2.5 % OPHTHALMIC SOLUTION    Place 1 drop into both eyes 4 (four) times daily as needed for dry eyes.   LEVOTHYROXINE (SYNTHROID, LEVOTHROID) 50  MCG TABLET    Take 1 tablet (50 mcg total) by mouth daily.   MULTIPLE VITAMIN (MULTIVITAMIN WITH MINERALS) TABS    Take 1 tablet by mouth daily.   PANTOPRAZOLE (PROTONIX) 40 MG TABLET    take 1 tablet by mouth once daily   ROPINIROLE (REQUIP) 0.25 MG TABLET    TAKE 1 TABLET BY MOUTH ONCE DAILY AFTER SUPPER. (TAKE 1-2 HOURS BEFORE GOING TO BED)   SIMETHICONE (GAS RELIEF) 125 MG CAPS    Take 1 each by mouth 4 (four) times daily as needed (GAS). As needed for gas relief   TRAMADOL (ULTRAM) 50 MG TABLET    Take 1 tablet (50 mg total) by mouth every 6 (six) hours as needed.  Modified Medications   No medications on file  Discontinued Medications   CEPHALEXIN (KEFLEX) 500 MG CAPSULE    Take 500 mg by mouth daily.   NITROGLYCERIN (NITROSTAT) 0.4 MG SL TABLET    Place 1 tablet (0.4 mg total) under the tongue every 5 (five) minutes x 3 doses as needed for chest pain.   OMEGA-3 FATTY ACIDS (OMEGA 3 PO)    Take 1 gel cap daily     Physical Exam:  Vitals:   05/12/17 0931  BP: 132/82  Pulse: 68  Resp: 17  Temp: 97.8 F (36.6 C)  TempSrc: Oral  SpO2: 98%  Weight: 186 lb 12.8 oz (84.7 kg)  Height: 5\' 1"  (1.549 m)   Body mass index is 35.3 kg/m.  Physical Exam  Constitutional: She is oriented to person, place, and time. She appears well-developed and well-nourished. No distress.  HENT:  Head: Normocephalic and atraumatic.  Eyes: Pupils are equal, round, and reactive to light.  Neck: Normal range of motion. Neck supple.  Cardiovascular: Normal rate, regular rhythm and normal heart sounds.   Pulmonary/Chest: Effort normal and breath sounds normal.  Abdominal: Soft. Bowel sounds are normal. She exhibits no distension. There is no tenderness.  Musculoskeletal: She exhibits no edema.       Back:  Point tenderness noted  Neurological: She is alert and oriented to person, place, and time. She displays normal reflexes. Coordination normal.  Skin: Skin is warm and dry. She is not diaphoretic.    Psychiatric: She has a normal mood and affect. Her behavior is normal. Judgment and thought content normal.    Labs reviewed: Basic Metabolic Panel:  Recent Labs  10/20/16 0848 02/08/17 1054 05/09/17 0819  NA 143  --  144  K 4.4  --  4.5  CL 107  --  108  CO2 25  --  25  GLUCOSE 99  --  87  BUN 16  18 17  CREATININE 1.05* 1.05 1.14*  CALCIUM 9.6  --  9.6  TSH 0.65  --   --    Liver Function Tests:  Recent Labs  10/20/16 0848 05/09/17 0819  AST 28 26  ALT 12 11  ALKPHOS 88 102  BILITOT 0.5 0.5  PROT 6.7 6.5  ALBUMIN 4.0 4.1   No results for input(s): LIPASE, AMYLASE in the last 8760 hours. No results for input(s): AMMONIA in the last 8760 hours. CBC:  Recent Labs  10/20/16 0848 05/09/17 0819  WBC 3.8 3.3*  NEUTROABS 1,254* 1,287*  HGB 12.8 12.3  HCT 40.3 38.6  MCV 94.2 95.8  PLT 259 284   Lipid Panel:  Recent Labs  10/20/16 0848 05/09/17 0819  CHOL 215* 211*  HDL 99 112  LDLCALC 103* 90  TRIG 67 47  CHOLHDL 2.2 1.9   TSH:  Recent Labs  10/20/16 0848  TSH 0.65   A1C: Lab Results  Component Value Date   HGBA1C 5.6 07/05/2014     Assessment/Plan 1. Chronic left-sided low back pain without sciatica Ongoing, worse at this time.  - traMADol (ULTRAM) 50 MG tablet; Take 1 tablet (50 mg total) by mouth every 6 (six) hours as needed.  Dispense: 90 tablet; Refill: 0 - DG Lumbar Spine Complete; Future - tizanidine (ZANAFLEX) 2 MG capsule; Take 1 capsule (2 mg total) by mouth 3 (three) times daily as needed for muscle spasms.  Dispense: 60 capsule; Refill: 2 - to use aleve 1 tablet PO BID -may use muscle rubs (after heat) -heat TID  2. Gastroesophageal reflux disease, esophagitis presence not specified -will try to stop protonix and start ranitidine  - ranitidine (ZANTAC) 150 MG tablet; Take 1 tablet (150 mg total) by mouth 2 (two) times daily.  Dispense: 60 tablet; Refill: 2  3. Hyperlipidemia, unspecified hyperlipidemia type LDL improved  on recent labs, will cont to monitor. Cont dietary modifications   4. Hypothyroidism, unspecified type TSH normal 0.65 in December. Will cont current dose.   5. Constipation, unspecified constipation type Controlled on OTC, to increase hydration.      Carlos American. Harle Battiest  [facility] [phone] 8 am - 5 pm) [phone] (after hours)

## 2017-05-30 DIAGNOSIS — G8929 Other chronic pain: Secondary | ICD-10-CM | POA: Diagnosis not present

## 2017-05-30 DIAGNOSIS — M25562 Pain in left knee: Secondary | ICD-10-CM | POA: Diagnosis not present

## 2017-05-30 DIAGNOSIS — M545 Low back pain: Secondary | ICD-10-CM | POA: Diagnosis not present

## 2017-06-27 DIAGNOSIS — M545 Low back pain: Secondary | ICD-10-CM | POA: Diagnosis not present

## 2017-06-27 DIAGNOSIS — M5136 Other intervertebral disc degeneration, lumbar region: Secondary | ICD-10-CM | POA: Diagnosis not present

## 2017-06-27 DIAGNOSIS — G8929 Other chronic pain: Secondary | ICD-10-CM | POA: Diagnosis not present

## 2017-07-20 ENCOUNTER — Ambulatory Visit (INDEPENDENT_AMBULATORY_CARE_PROVIDER_SITE_OTHER): Payer: Self-pay

## 2017-07-20 ENCOUNTER — Encounter: Payer: Self-pay | Admitting: Podiatry

## 2017-07-20 ENCOUNTER — Other Ambulatory Visit: Payer: Self-pay | Admitting: Podiatry

## 2017-07-20 ENCOUNTER — Ambulatory Visit (INDEPENDENT_AMBULATORY_CARE_PROVIDER_SITE_OTHER): Payer: Medicare Other | Admitting: Podiatry

## 2017-07-20 VITALS — BP 119/65 | HR 63

## 2017-07-20 DIAGNOSIS — M779 Enthesopathy, unspecified: Secondary | ICD-10-CM

## 2017-07-20 DIAGNOSIS — M79674 Pain in right toe(s): Secondary | ICD-10-CM | POA: Diagnosis not present

## 2017-07-20 DIAGNOSIS — M7751 Other enthesopathy of right foot: Secondary | ICD-10-CM

## 2017-07-20 MED ORDER — TRIAMCINOLONE ACETONIDE 10 MG/ML IJ SUSP
10.0000 mg | Freq: Once | INTRAMUSCULAR | Status: AC
  Administered 2017-07-20: 10 mg

## 2017-07-20 NOTE — Progress Notes (Signed)
   Subjective:    Patient ID: Colleen [name], female    DOB: November 26, 1945, 71 y.o.   MRN: [id number]  HPI  Chief Complaint  Patient presents with  . Toe Pain    Rt hallux       Review of Systems  Musculoskeletal: Positive for arthralgias, back pain and myalgias.  All other systems reviewed and are negative.      Objective:   Physical Exam        Assessment & Plan:

## 2017-07-20 NOTE — Progress Notes (Signed)
Subjective:    Patient ID: Colleen [name], female   DOB: 71 y.o.   MRN: [id number]   HPI patient states that she had surgery 7 years ago in her right foot and that the big toe has remained sore since then and hurts at the interphalangeal joint with mild redness and swelling associated with it. States that the pain is present at night but also hurts during the day with pressure    Review of Systems  All other systems reviewed and are negative.       Objective:  Physical Exam  Constitutional: She appears well-developed and well-nourished.  Cardiovascular: Intact distal pulses.   Musculoskeletal: Normal range of motion.  Neurological: She is alert.  Skin: Skin is warm.  Nursing note and vitals reviewed.  patient indicates that she does not currently smoke and did smoke in the past but has quit for a number of years. Patient is noted to have good digital pulses with neurological intact and is noted to have enlargement of the interphalangeal joint right big toe. Patient upon range of motion of the interphalangeal joint did not have significant discomfort and I did not note crepitus. Has good joint perfusion and well oriented patient 3     Assessment:   Inflammatory changes of the right interphalangeal joint right which may be due to the spur formation or may be in the care of of arthritis     Plan:    H&P condition reviewed and today I did a proximal nerve block of the digit and I then entered injected the interphalangeal joint with 3 mg [place] some Kenalog 5 mg Xylocaine. Discussed possibility for bone resection and reappoint in 2 months  X-ray indicates that there is spurring of the interphalangeal joint right big toe with no indication of arthritis of the joint

## 2017-08-11 DIAGNOSIS — N390 Urinary tract infection, site not specified: Secondary | ICD-10-CM | POA: Diagnosis not present

## 2017-08-11 DIAGNOSIS — Z23 Encounter for immunization: Secondary | ICD-10-CM | POA: Diagnosis not present

## 2017-08-11 DIAGNOSIS — R319 Hematuria, unspecified: Secondary | ICD-10-CM | POA: Diagnosis not present

## 2017-08-20 DIAGNOSIS — Z23 Encounter for immunization: Secondary | ICD-10-CM | POA: Diagnosis not present

## 2017-09-14 ENCOUNTER — Other Ambulatory Visit: Payer: Self-pay | Admitting: *Deleted

## 2017-09-14 DIAGNOSIS — K219 Gastro-esophageal reflux disease without esophagitis: Secondary | ICD-10-CM

## 2017-09-14 MED ORDER — RANITIDINE HCL 150 MG PO TABS: 150.0000 mg | ORAL_TABLET | Freq: Two times a day (BID) | ORAL | 2 refills | Status: DC

## 2017-09-14 NOTE — Telephone Encounter (Signed)
[facility] [address] 

## 2017-09-20 ENCOUNTER — Ambulatory Visit: Payer: Self-pay | Admitting: Podiatry

## 2017-09-28 ENCOUNTER — Other Ambulatory Visit: Payer: Self-pay | Admitting: *Deleted

## 2017-10-05 ENCOUNTER — Ambulatory Visit: Payer: Self-pay

## 2017-10-12 ENCOUNTER — Other Ambulatory Visit: Payer: Self-pay | Admitting: *Deleted

## 2017-10-12 DIAGNOSIS — E039 Hypothyroidism, unspecified: Secondary | ICD-10-CM

## 2017-10-12 MED ORDER — LEVOTHYROXINE SODIUM 50 MCG PO TABS: 50.0000 ug | ORAL_TABLET | Freq: Every day | ORAL | 0 refills | Status: DC

## 2017-10-16 ENCOUNTER — Other Ambulatory Visit: Payer: Self-pay | Admitting: *Deleted

## 2017-10-16 DIAGNOSIS — E039 Hypothyroidism, unspecified: Secondary | ICD-10-CM

## 2017-10-16 MED ORDER — LEVOTHYROXINE SODIUM 50 MCG PO TABS: 50.0000 ug | ORAL_TABLET | Freq: Every day | ORAL | 0 refills | Status: DC

## 2017-10-16 NOTE — Telephone Encounter (Signed)
Re faxed Rx. Patient stated pharmacy did not received.

## 2017-10-18 ENCOUNTER — Other Ambulatory Visit: Payer: Self-pay

## 2017-10-18 DIAGNOSIS — G8929 Other chronic pain: Secondary | ICD-10-CM

## 2017-10-18 DIAGNOSIS — M545 Low back pain, unspecified: Secondary | ICD-10-CM

## 2017-10-18 MED ORDER — TRAMADOL HCL 50 MG PO TABS: 50.0000 mg | ORAL_TABLET | Freq: Four times a day (QID) | ORAL | 0 refills | Status: DC | PRN

## 2017-10-18 NOTE — Telephone Encounter (Signed)
A faxed refill quest was received from [facility] for tramadol 50 mg tablets. Rx was called in to pharmacy after verifying last fill date, provider, and quantity on PMP [place].

## 2017-10-20 DIAGNOSIS — M7542 Impingement syndrome of left shoulder: Secondary | ICD-10-CM | POA: Diagnosis not present

## 2017-10-20 DIAGNOSIS — M19012 Primary osteoarthritis, left shoulder: Secondary | ICD-10-CM | POA: Diagnosis not present

## 2017-10-20 DIAGNOSIS — G8929 Other chronic pain: Secondary | ICD-10-CM | POA: Diagnosis not present

## 2017-10-20 DIAGNOSIS — M25512 Pain in left shoulder: Secondary | ICD-10-CM | POA: Diagnosis not present

## 2017-10-26 ENCOUNTER — Ambulatory Visit (INDEPENDENT_AMBULATORY_CARE_PROVIDER_SITE_OTHER): Payer: Medicare Other

## 2017-10-26 VITALS — BP 122/70 | HR 59 | Temp 97.9°F | Ht 61.0 in | Wt 194.0 lb

## 2017-10-26 DIAGNOSIS — Z0000 Encounter for general adult medical examination without abnormal findings: Secondary | ICD-10-CM | POA: Diagnosis not present

## 2017-10-26 NOTE — Patient Instructions (Signed)
Colleen [name] , Thank you for taking time to come for your Medicare Wellness Visit. I appreciate your ongoing commitment to your health goals. Please review the following plan we discussed and let me know if I can assist you in the future.   Screening recommendations/referrals: Colonoscopy excluded, you are over age 73 Mammogram excluded, you are over age 37 Bone Density excluded, you are over age 26 Recommended yearly ophthalmology/optometry visit for glaucoma screening and checkup Recommended yearly dental visit for hygiene and checkup  Vaccinations: Influenza vaccine up to date, due 2019 fall season Pneumococcal vaccine up to date Tdap vaccine up to date. Due 10/21/2025 Shingles vaccine up to date    Advanced directives: In Chart  Conditions/risks identified: None  Next appointment: Sherrie Mustache, NP 11/14/2017 @ 8:30am             Tyson Dense, RN 10/29/2018 @ 8:30am   Preventive Care 65 Years and Older, Female Preventive care refers to lifestyle choices and visits with your health care provider that can promote health and wellness. What does preventive care include?  A yearly physical exam. This is also called an annual well check.  Dental exams once or twice a year.  Routine eye exams. Ask your health care provider how often you should have your eyes checked.  Personal lifestyle choices, including:  Daily care of your teeth and gums.  Regular physical activity.  Eating a healthy diet.  Avoiding tobacco and drug use.  Limiting alcohol use.  Practicing safe sex.  Taking low-dose aspirin every day.  Taking vitamin and mineral supplements as recommended by your health care provider. What happens during an annual well check? The services and screenings done by your health care provider during your annual well check will depend on your age, overall health, lifestyle risk factors, and family history of disease. Counseling  Your health care provider may ask you  questions about your:  Alcohol use.  Tobacco use.  Drug use.  Emotional well-being.  Home and relationship well-being.  Sexual activity.  Eating habits.  History of falls.  Memory and ability to understand (cognition).  Work and work Statistician.  Reproductive health. Screening  You may have the following tests or measurements:  Height, weight, and BMI.  Blood pressure.  Lipid and cholesterol levels. These may be checked every 5 years, or more frequently if you are over 73 years old.  Skin check.  Lung cancer screening. You may have this screening every year starting at age 58 if you have a 30-pack-year history of smoking and currently smoke or have quit within the past 15 years.  Fecal occult blood test (FOBT) of the stool. You may have this test every year starting at age 66.  Flexible sigmoidoscopy or colonoscopy. You may have a sigmoidoscopy every 5 years or a colonoscopy every 10 years starting at age 72.  Hepatitis C blood test.  Hepatitis B blood test.  Sexually transmitted disease (STD) testing.  Diabetes screening. This is done by checking your blood sugar (glucose) after you have not eaten for a while (fasting). You may have this done every 1-3 years.  Bone density scan. This is done to screen for osteoporosis. You may have this done starting at age 73.  Mammogram. This may be done every 1-2 years. Talk to your health care provider about how often you should have regular mammograms. Talk with your health care provider about your test results, treatment options, and if necessary, the need for more tests. Vaccines  Your health care provider may recommend certain vaccines, such as:  Influenza vaccine. This is recommended every year.  Tetanus, diphtheria, and acellular pertussis (Tdap, Td) vaccine. You may need a Td booster every 10 years.  Zoster vaccine. You may need this after age 66.  Pneumococcal 13-valent conjugate (PCV13) vaccine. One dose is  recommended after age 27.  Pneumococcal polysaccharide (PPSV23) vaccine. One dose is recommended after age 85. Talk to your health care provider about which screenings and vaccines you need and how often you need them. This information is not intended to replace advice given to you by your health care provider. Make sure you discuss any questions you have with your health care provider. Document Released: 11/13/2015 Document Revised: 07/06/2016 Document Reviewed: 08/18/2015 Elsevier Interactive Patient Education  2017 [place] Prevention in the Home Falls can cause injuries. They can happen to people of all ages. There are many things you can do to make your home safe and to help prevent falls. What can I do on the outside of my home?  Regularly fix the edges of walkways and driveways and fix any cracks.  Remove anything that might make you trip as you walk through a door, such as a raised step or threshold.  Trim any bushes or trees on the path to your home.  Use bright outdoor lighting.  Clear any walking paths of anything that might make someone trip, such as rocks or tools.  Regularly check to see if handrails are loose or broken. Make sure that both sides of any steps have handrails.  Any raised decks and porches should have guardrails on the edges.  Have any leaves, snow, or ice cleared regularly.  Use sand or salt on walking paths during winter.  Clean up any spills in your garage right away. This includes oil or grease spills. What can I do in the bathroom?  Use night lights.  Install grab bars by the toilet and in the tub and shower. Do not use towel bars as grab bars.  Use non-skid mats or decals in the tub or shower.  If you need to sit down in the shower, use a plastic, non-slip stool.  Keep the floor dry. Clean up any water that spills on the floor as soon as it happens.  Remove soap buildup in the tub or shower regularly.  Attach bath mats  securely with double-sided non-slip rug tape.  Do not have throw rugs and other things on the floor that can make you trip. What can I do in the bedroom?  Use night lights.  Make sure that you have a light by your bed that is easy to reach.  Do not use any sheets or blankets that are too big for your bed. They should not hang down onto the floor.  Have a firm chair that has side arms. You can use this for support while you get dressed.  Do not have throw rugs and other things on the floor that can make you trip. What can I do in the kitchen?  Clean up any spills right away.  Avoid walking on wet floors.  Keep items that you use a lot in easy-to-reach places.  If you need to reach something above you, use a strong step stool that has a grab bar.  Keep electrical cords out of the way.  Do not use floor polish or wax that makes floors slippery. If you must use wax, use non-skid floor wax.  Do  not have throw rugs and other things on the floor that can make you trip. What can I do with my stairs?  Do not leave any items on the stairs.  Make sure that there are handrails on both sides of the stairs and use them. Fix handrails that are broken or loose. Make sure that handrails are as long as the stairways.  Check any carpeting to make sure that it is firmly attached to the stairs. Fix any carpet that is loose or worn.  Avoid having throw rugs at the top or bottom of the stairs. If you do have throw rugs, attach them to the floor with carpet tape.  Make sure that you have a light switch at the top of the stairs and the bottom of the stairs. If you do not have them, ask someone to add them for you. What else can I do to help prevent falls?  Wear shoes that:  Do not have high heels.  Have rubber bottoms.  Are comfortable and fit you well.  Are closed at the toe. Do not wear sandals.  If you use a stepladder:  Make sure that it is fully opened. Do not climb a closed  stepladder.  Make sure that both sides of the stepladder are locked into place.  Ask someone to hold it for you, if possible.  Clearly mark and make sure that you can see:  Any grab bars or handrails.  First and last steps.  Where the edge of each step is.  Use tools that help you move around (mobility aids) if they are needed. These include:  Canes.  Walkers.  Scooters.  Crutches.  Turn on the lights when you go into a dark area. Replace any light bulbs as soon as they burn out.  Set up your furniture so you have a clear path. Avoid moving your furniture around.  If any of your floors are uneven, fix them.  If there are any pets around you, be aware of where they are.  Review your medicines with your doctor. Some medicines can make you feel dizzy. This can increase your chance of falling. Ask your doctor what other things that you can do to help prevent falls. This information is not intended to replace advice given to you by your health care provider. Make sure you discuss any questions you have with your health care provider. Document Released: 08/13/2009 Document Revised: 03/24/2016 Document Reviewed: 11/21/2014 Elsevier Interactive Patient Education  2017 [employer].

## 2017-10-26 NOTE — Progress Notes (Signed)
Subjective:   Colleen [name] is a 71 y.o. female who presents for Medicare Annual (Subsequent) preventive examination.  Last AWV-10/20/2016    Objective:     Vitals: There were no vitals taken for this visit.  There is no height or weight on file to calculate BMI.  Advanced Directives 05/12/2017 10/27/2016 10/20/2016 04/21/2016 02/24/2016 01/29/2016 12/31/2015  Does Patient Have a Medical Advance Directive? No Yes Yes Yes No No Yes  Type of Advance Directive - Healthcare Power of [place]  Does patient want to make changes to medical advance directive? - - - - - - No - Patient declined  Copy of [place] in Chart? - Yes Yes Yes - - No - copy requested  Would patient like information on creating a medical advance directive? - - - - - - -    Tobacco Social History   Tobacco Use  Smoking Status Former Smoker  . Types: Cigarettes  . Last attempt to quit: 10/31/1986  . Years since quitting: 31.0  Smokeless Tobacco Never Used     Counseling given: Not Answered   Clinical Intake:                       Past Medical History:  Diagnosis Date  . Allergic rhinitis   . Anxiety   . Arthritis   . Colon polyps    adenomatous  . Corns and callosities   . Diverticulosis   . GERD (gastroesophageal reflux disease)   . Glaucoma   . Hypercholesteremia   . Hypothyroidism   . Obesity   . Screening for diabetes mellitus   . Urge incontinence    Past Surgical History:  Procedure Laterality Date  . ABDOMINAL HYSTERECTOMY  1985   Dr Mancel Bale  . BUNIONECTOMY Right    Dr Jomarie Longs  . CARPAL TUNNEL RELEASE Bilateral 2010   Dr Laurance Flatten  . CESAREAN SECTION  1983  . COLONOSCOPY    . ROTATOR CUFF REPAIR Right    Dr Theda Sers  . THYROIDECTOMY  1987   Dr Guadlupe Spanish  . TONSILLECTOMY  1951   Family History  Problem Relation Age of Onset  . Leukemia Mother 46  . Colon cancer Mother   . Lung  cancer Father   . Diabetes Brother   . COPD Brother   . Lung cancer Brother   . Diabetes Maternal Grandmother   . Cancer Maternal Grandfather        unknown type   Social History   Socioeconomic History  . Marital status: Widowed    Spouse name: Not on file  . Number of children: 1  . Years of education: Not on file  . Highest education level: Not on file  Social Needs  . Financial resource strain: Not on file  . Food insecurity - worry: Not on file  . Food insecurity - inability: Not on file  . Transportation needs - medical: Not on file  . Transportation needs - non-medical: Not on file  Occupational History  . Occupation: retired  Tobacco Use  . Smoking status: Former Smoker    Types: Cigarettes    Last attempt to quit: 10/31/1986    Years since quitting: 31.0  . Smokeless tobacco: Never Used  Substance and Sexual Activity  . Alcohol use: No    Alcohol/week: 0.0 oz  . Drug use: No  . Sexual activity: No  Other Topics Concern  .  Not on file  Social History Narrative  . Not on file    Outpatient Encounter Medications as of 10/26/2017  Medication Sig  . acetaminophen (TYLENOL) 500 MG tablet Take 1,000 mg by mouth every 6 (six) hours as needed for mild pain.  . Ascorbic Acid (VITAMIN C PO) Take 1 tablet by mouth daily.  Marland Kitchen aspirin 81 MG tablet Take 81 mg by mouth daily.  Marland Kitchen CALCIUM-VITAMIN D PO Take 2 tablets by mouth daily.   . cephALEXin (KEFLEX) 500 MG capsule take 1 capsule by mouth once daily  . diclofenac sodium (VOLTAREN) 1 % GEL Apply 2 g topically 2 (two) times daily.  . hydroxypropyl methylcellulose / hypromellose (ISOPTO TEARS / GONIOVISC) 2.5 % ophthalmic solution Place 1 drop into both eyes 4 (four) times daily as needed for dry eyes.  Marland Kitchen levothyroxine (SYNTHROID, LEVOTHROID) 50 MCG tablet Take 1 tablet (50 mcg total) by mouth daily.  . Multiple Vitamin (MULTIVITAMIN WITH MINERALS) TABS Take 1 tablet by mouth daily.  . ranitidine (ZANTAC) 150 MG tablet Take  1 tablet (150 mg total) 2 (two) times daily by mouth.  Marland Kitchen rOPINIRole (REQUIP) 0.25 MG tablet TAKE 1 TABLET BY MOUTH ONCE DAILY AFTER SUPPER. (TAKE 1-2 HOURS BEFORE GOING TO BED)  . tizanidine (ZANAFLEX) 2 MG capsule Take 1 capsule (2 mg total) by mouth 3 (three) times daily as needed for muscle spasms.  . traMADol (ULTRAM) 50 MG tablet Take 1 tablet (50 mg total) by mouth every 6 (six) hours as needed.   No facility-administered encounter medications on file as of 10/26/2017.     Activities of Daily Living No flowsheet data found.  Patient Care Team: [name], NP as PCP - General (Nurse Practitioner)    Assessment:   This is a routine wellness examination for [facility].  Exercise Activities and Dietary recommendations    Goals    . Weight (lb) < 170 lb (77.1 kg)     Starting 10/20/16, I will attempt to decrease my weight to get to 170 lbs.       Fall Risk Fall Risk  05/12/2017 10/27/2016 10/20/2016 04/21/2016 02/24/2016  Falls in the past year? No No No No No   Is the patient's home free of loose throw rugs in walkways, pet beds, electrical cords, etc?   yes      Grab bars in the bathroom? yes      Handrails on the stairs?   yes      Adequate lighting?   yes  Timed Get Up and Go performed: 18 seconds, fall risk  Depression Screen PHQ 2/9 Scores 10/20/2016 04/21/2016 10/22/2015 12/23/2014  PHQ - 2 Score 0 0 0 0     Cognitive Function MMSE - Mini Mental State Exam 10/20/2016 10/22/2015 07/08/2014  Not completed: - (No Data) -  Orientation to time 5 5 5   Orientation to Place 5 5 5   Registration 3 3 3   Attention/ Calculation 5 5 5   Recall 3 3 2   Language- name 2 objects 2 2 2   Language- repeat 1 1 1   Language- follow 3 step command 3 2 3   Language- read & follow direction 1 1 1   Write a sentence 1 1 1   Copy design 1 1 1   Total score 30 29 29         Immunization History  Administered Date(s) Administered  . Influenza,inj,Quad PF,6+ Mos 07/08/2014  .  Influenza-Unspecified 08/09/2012, 08/01/2013, 06/30/2015, 08/04/2016  . Pneumococcal Conjugate-13 11/03/2014  . Pneumococcal Polysaccharide-23  02/19/2013, 08/20/2017  . Td 10/31/2000  . Tdap 10/22/2015  . Varicella 06/15/2011  . Zoster 10/31/2010  . Zoster Recombinat (Shingrix) 03/21/2017, 06/04/2017    Qualifies for Shingles Vaccine? No, already got 2 Shingrix shots  Screening Tests Health Maintenance  Topic Date Due  . INFLUENZA VACCINE  05/31/2017  . MAMMOGRAM  12/30/2018  . COLONOSCOPY  02/16/2022  . TETANUS/TDAP  10/21/2025  . DEXA SCAN  Completed  . Hepatitis C Screening  Completed  . PNA vac Low Risk Adult  Completed    Cancer Screenings: Lung: Low Dose CT Chest recommended if Age 76-80 years, 30 pack-year currently smoking OR have quit w/in 15years. Patient does not qualify. Breast:  Up to date on Mammogram? Yes   Up to date of Bone Density/Dexa? Yes Colorectal: up to date  Additional Screenings:  Hepatitis B/HIV/Syphillis:not indicated Hepatitis C Screening: not indicated     Plan:    I have personally reviewed and addressed the Medicare Annual Wellness questionnaire and have noted the following in the patient's chart:  A. Medical and social history B. Use of alcohol, tobacco or illicit drugs  C. Current medications and supplements D. Functional ability and status E.  Nutritional status F.  Physical activity G. Advance directives H. List of other physicians I.  Hospitalizations, surgeries, and ER visits in previous 12 months J.  [place] to include hearing, vision, cognitive, depression L. Referrals and appointments - none  In addition, I have reviewed and discussed with patient certain preventive protocols, quality metrics, and best practice recommendations. A written personalized care plan for preventive services as well as general preventive health recommendations were provided to patient.  See attached scanned questionnaire for additional  information.   Signed,   Tyson Dense, RN Nurse Health Advisor   Quick Notes   Health Maintenance: Up to date.     Abnormal Screen: MMSE 28/30. Passed clock drawing     Patient Concerns: None     Nurse Concerns: None

## 2017-11-14 ENCOUNTER — Ambulatory Visit: Payer: Medicare HMO | Admitting: Nurse Practitioner

## 2017-11-14 ENCOUNTER — Encounter: Payer: Self-pay | Admitting: Nurse Practitioner

## 2017-11-14 VITALS — BP 120/72 | HR 84 | Temp 98.1°F | Ht 61.0 in | Wt 194.0 lb

## 2017-11-14 DIAGNOSIS — G2581 Restless legs syndrome: Secondary | ICD-10-CM | POA: Diagnosis not present

## 2017-11-14 DIAGNOSIS — M8580 Other specified disorders of bone density and structure, unspecified site: Secondary | ICD-10-CM

## 2017-11-14 DIAGNOSIS — E038 Other specified hypothyroidism: Secondary | ICD-10-CM

## 2017-11-14 DIAGNOSIS — K5901 Slow transit constipation: Secondary | ICD-10-CM | POA: Diagnosis not present

## 2017-11-14 DIAGNOSIS — E782 Mixed hyperlipidemia: Secondary | ICD-10-CM

## 2017-11-14 DIAGNOSIS — M150 Primary generalized (osteo)arthritis: Secondary | ICD-10-CM | POA: Diagnosis not present

## 2017-11-14 DIAGNOSIS — M159 Polyosteoarthritis, unspecified: Secondary | ICD-10-CM

## 2017-11-14 NOTE — Patient Instructions (Signed)
Try to increase activity as tolerates   DASH Eating Plan DASH stands for "Dietary Approaches to Stop Hypertension." The DASH eating plan is a healthy eating plan that has been shown to reduce high blood pressure (hypertension). It may also reduce your risk for type 2 diabetes, heart disease, and stroke. The DASH eating plan may also help with weight loss. What are tips for following this plan? General guidelines  Avoid eating more than 2,300 mg (milligrams) of salt (sodium) a day. If you have hypertension, you may need to reduce your sodium intake to 1,500 mg a day.  Limit alcohol intake to no more than 1 drink a day for nonpregnant women and 2 drinks a day for men. One drink equals 12 oz of beer, 5 oz of wine, or 1 oz of hard liquor.  Work with your health care provider to maintain a healthy body weight or to lose weight. Ask what an ideal weight is for you.  Get at least 30 minutes of exercise that causes your heart to beat faster (aerobic exercise) most days of the week. Activities may include walking, swimming, or biking.  Work with your health care provider or diet and nutrition specialist (dietitian) to adjust your eating plan to your individual calorie needs. Reading food labels  Check food labels for the amount of sodium per serving. Choose foods with less than 5 percent of the Daily Value of sodium. Generally, foods with less than 300 mg of sodium per serving fit into this eating plan.  To find whole grains, look for the word "whole" as the first word in the ingredient list. Shopping  Buy products labeled as "low-sodium" or "no salt added."  Buy fresh foods. Avoid canned foods and premade or frozen meals. Cooking  Avoid adding salt when cooking. Use salt-free seasonings or herbs instead of table salt or sea salt. Check with your health care provider or pharmacist before using salt substitutes.  Do not fry foods. Cook foods using healthy methods such as baking, boiling,  grilling, and broiling instead.  Cook with heart-healthy oils, such as olive, canola, soybean, or sunflower oil. Meal planning   Eat a balanced diet that includes: ? 5 or more servings of fruits and vegetables each day. At each meal, try to fill half of your plate with fruits and vegetables. ? Up to 6-8 servings of whole grains each day. ? Less than 6 oz of lean meat, poultry, or fish each day. A 3-oz serving of meat is about the same size as a deck of cards. One egg equals 1 oz. ? 2 servings of low-fat dairy each day. ? A serving of nuts, seeds, or beans 5 times each week. ? Heart-healthy fats. Healthy fats called Omega-3 fatty acids are found in foods such as flaxseeds and coldwater fish, like sardines, salmon, and mackerel.  Limit how much you eat of the following: ? Canned or prepackaged foods. ? Food that is high in trans fat, such as fried foods. ? Food that is high in saturated fat, such as fatty meat. ? Sweets, desserts, sugary drinks, and other foods with added sugar. ? Full-fat dairy products.  Do not salt foods before eating.  Try to eat at least 2 vegetarian meals each week.  Eat more home-cooked food and less restaurant, buffet, and fast food.  When eating at a restaurant, ask that your food be prepared with less salt or no salt, if possible. What foods are recommended? The items listed may not be a  complete list. Talk with your dietitian about what dietary choices are best for you. Grains Whole-grain or whole-wheat bread. Whole-grain or whole-wheat pasta. Brown rice. [place]. Bulgur. Whole-grain and low-sodium cereals. Pita bread. Low-fat, low-sodium crackers. Whole-wheat flour tortillas. Vegetables Fresh or frozen vegetables (raw, steamed, roasted, or grilled). Low-sodium or reduced-sodium tomato and vegetable juice. Low-sodium or reduced-sodium tomato sauce and tomato paste. Low-sodium or reduced-sodium canned vegetables. Fruits All fresh, dried, or frozen  fruit. Canned fruit in natural juice (without added sugar). Meat and other protein foods Skinless chicken or [place]. Ground chicken or [place]. Pork with fat trimmed off. Fish and seafood. Egg whites. Dried beans, peas, or lentils. Unsalted nuts, nut butters, and seeds. Unsalted canned beans. Lean cuts of beef with fat trimmed off. Low-sodium, lean deli meat. Dairy Low-fat (1%) or fat-free (skim) milk. Fat-free, low-fat, or reduced-fat cheeses. Nonfat, low-sodium ricotta or cottage cheese. Low-fat or nonfat yogurt. Low-fat, low-sodium cheese. Fats and oils Soft margarine without trans fats. Vegetable oil. Low-fat, reduced-fat, or light mayonnaise and salad dressings (reduced-sodium). Canola, safflower, olive, soybean, and sunflower oils. Avocado. Seasoning and other foods Herbs. Spices. Seasoning mixes without salt. Unsalted popcorn and pretzels. Fat-free sweets. What foods are not recommended? The items listed may not be a complete list. Talk with your dietitian about what dietary choices are best for you. Grains Baked goods made with fat, such as croissants, muffins, or some breads. Dry pasta or rice meal packs. Vegetables Creamed or fried vegetables. Vegetables in a cheese sauce. Regular canned vegetables (not low-sodium or reduced-sodium). Regular canned tomato sauce and paste (not low-sodium or reduced-sodium). Regular tomato and vegetable juice (not low-sodium or reduced-sodium). Angie Fava. Olives. Fruits Canned fruit in a light or heavy syrup. Fried fruit. Fruit in cream or butter sauce. Meat and other protein foods Fatty cuts of meat. Ribs. Fried meat. Berniece Salines. Sausage. Bologna and other processed lunch meats. Salami. Fatback. Hotdogs. Bratwurst. Salted nuts and seeds. Canned beans with added salt. Canned or smoked fish. Whole eggs or egg yolks. Chicken or [place] with skin. Dairy Whole or 2% milk, cream, and half-and-half. Whole or full-fat cream cheese. Whole-fat or sweetened yogurt. Full-fat  cheese. Nondairy creamers. Whipped toppings. Processed cheese and cheese spreads. Fats and oils Butter. Stick margarine. Lard. Shortening. Ghee. Bacon fat. Tropical oils, such as coconut, palm kernel, or palm oil. Seasoning and other foods Salted popcorn and pretzels. Onion salt, garlic salt, seasoned salt, table salt, and sea salt. Worcestershire sauce. Tartar sauce. Barbecue sauce. Teriyaki sauce. Soy sauce, including reduced-sodium. Steak sauce. Canned and packaged gravies. Fish sauce. Oyster sauce. Cocktail sauce. Horseradish that you find on the shelf. Ketchup. Mustard. Meat flavorings and tenderizers. Bouillon cubes. Hot sauce and Tabasco sauce. Premade or packaged marinades. Premade or packaged taco seasonings. Relishes. Regular salad dressings. Where to find more information:  National Heart, Lung, and [place]: https://wilson-eaton.com/  American Heart Association: www.heart.org Summary  The DASH eating plan is a healthy eating plan that has been shown to reduce high blood pressure (hypertension). It may also reduce your risk for type 2 diabetes, heart disease, and stroke.  With the DASH eating plan, you should limit salt (sodium) intake to 2,300 mg a day. If you have hypertension, you may need to reduce your sodium intake to 1,500 mg a day.  When on the DASH eating plan, aim to eat more fresh fruits and vegetables, whole grains, lean proteins, low-fat dairy, and heart-healthy fats.  Work with your health care provider or diet and nutrition specialist (dietitian)  to adjust your eating plan to your individual calorie needs. This information is not intended to replace advice given to you by your health care provider. Make sure you discuss any questions you have with your health care provider. Document Released: 10/06/2011 Document Revised: 10/10/2016 Document Reviewed: 10/10/2016 Elsevier Interactive Patient Education  2018 Elsevier Inc.  

## 2017-11-14 NOTE — Progress Notes (Signed)
[name]: Patient Care Team: [name], NP as PCP - General (Nurse Practitioner)  Advanced Directive information    No Known Allergies  Chief Complaint  Patient presents with  . Medical Management of Chronic Issues    Pt is being seen for a 6 month routine visit.      HPI: Patient is a 72 y.o. female seen in the office today for routine follow up. Pt had AWV on 10/26/17, no acute concerns based on screenings.   Hyperlipidemia- not on medications. Trying to work on diet, the holidays were not good  OA- complaints of pain in shoulder, knee and back, seeing 3 different orthopedic specialist for each joint. Several injections which help the pain.   GERD- on zantac.   Constipation-not on anything routinely. Doing better.   RLS- on requip which controls symptoms  Hypothyroid- taking synthroid 50 mcg last TSH in 0.65 ~1 year ago.  Osteopenia noted on dexa scan 11/22/2016, currently on calcium and vit d supplements. Unable to do exercises due to pain in back and knee  Recurrent UTIs on keflex daily for prevention. Had UTI a few months ago, went to urgent care for treatment, this has resolved currently   Review of Systems:  Review of Systems  Constitutional: Negative for chills, fever and weight loss.  HENT: Negative for tinnitus.   Respiratory: Negative for cough, sputum production and shortness of breath.   Cardiovascular: Negative for chest pain, palpitations and leg swelling.  Gastrointestinal: Positive for heartburn (controlled on zantac). Negative for abdominal pain, constipation and diarrhea.  Genitourinary: Negative for dysuria, frequency and urgency.  Musculoskeletal: Positive for back pain, joint pain and myalgias. Negative for falls.  Skin: Negative.   Neurological: Negative for dizziness, weakness and headaches.  Psychiatric/Behavioral: Negative for depression and memory loss. The patient does not have insomnia.     Past Medical History:  Diagnosis  Date  . Allergic rhinitis   . Anxiety   . Arthritis   . Colon polyps    adenomatous  . Corns and callosities   . Diverticulosis   . GERD (gastroesophageal reflux disease)   . Glaucoma   . Hypercholesteremia   . Hypothyroidism   . Obesity   . Screening for diabetes mellitus   . Urge incontinence    Past Surgical History:  Procedure Laterality Date  . ABDOMINAL HYSTERECTOMY  1985   Dr Mancel Bale  . BUNIONECTOMY Right    Dr Jomarie Longs  . CARPAL TUNNEL RELEASE Bilateral 2010   Dr Laurance Flatten  . CESAREAN SECTION  1983  . COLONOSCOPY    . ROTATOR CUFF REPAIR Right    Dr Theda Sers  . THYROIDECTOMY  1987   Dr Guadlupe Spanish  . TONSILLECTOMY  1951   Social History:   reports that she quit smoking about 31 years ago. Her smoking use included cigarettes. she has never used smokeless tobacco. She reports that she does not drink alcohol or use drugs.  Family History  Problem Relation Age of Onset  . Leukemia Mother 75  . Colon cancer Mother   . Lung cancer Father   . Diabetes Brother   . COPD Brother   . Lung cancer Brother   . Diabetes Maternal Grandmother   . Cancer Maternal Grandfather        unknown type    Medications: Patient's Medications  New Prescriptions   No medications on file  Previous Medications   ACETAMINOPHEN (TYLENOL) 500 MG TABLET    Take 1,000 mg by mouth  every 6 (six) hours as needed for mild pain.   ASCORBIC ACID (VITAMIN C PO)    Take 1 tablet by mouth daily.   ASPIRIN 81 MG TABLET    Take 81 mg by mouth daily.   CALCIUM-VITAMIN D PO    Take 2 tablets by mouth daily.    CEPHALEXIN (KEFLEX) 500 MG CAPSULE    take 1 capsule by mouth once daily   DICLOFENAC SODIUM (VOLTAREN) 1 % GEL    Apply 2 g topically 2 (two) times daily.   HYDROXYPROPYL METHYLCELLULOSE / HYPROMELLOSE (ISOPTO TEARS / GONIOVISC) 2.5 % OPHTHALMIC SOLUTION    Place 1 drop into both eyes 4 (four) times daily as needed for dry eyes.   LEVOTHYROXINE (SYNTHROID, LEVOTHROID) 50 MCG TABLET    Take 1 tablet  (50 mcg total) by mouth daily.   MULTIPLE VITAMIN (MULTIVITAMIN WITH MINERALS) TABS    Take 1 tablet by mouth daily.   RANITIDINE (ZANTAC) 150 MG TABLET    Take 1 tablet (150 mg total) 2 (two) times daily by mouth.   ROPINIROLE (REQUIP) 0.25 MG TABLET    TAKE 1 TABLET BY MOUTH ONCE DAILY AFTER SUPPER. (TAKE 1-2 HOURS BEFORE GOING TO BED)   TIZANIDINE (ZANAFLEX) 2 MG CAPSULE    Take 1 capsule (2 mg total) by mouth 3 (three) times daily as needed for muscle spasms.   TRAMADOL (ULTRAM) 50 MG TABLET    Take 1 tablet (50 mg total) by mouth every 6 (six) hours as needed.  Modified Medications   No medications on file  Discontinued Medications   No medications on file     Physical Exam:  Vitals:   11/14/17 0830  BP: 120/72  Pulse: 84  Temp: 98.1 F (36.7 C)  TempSrc: Oral  SpO2: 96%  Weight: 194 lb (88 kg)  Height: 5' 1"  (1.549 m)   Body mass index is 36.66 kg/m.  Physical Exam  Constitutional: She is oriented to person, place, and time. She appears well-developed and well-nourished. No distress.  HENT:  Head: Normocephalic and atraumatic.  Eyes: Pupils are equal, round, and reactive to light.  Neck: Normal range of motion. Neck supple.  Cardiovascular: Normal rate, regular rhythm and normal heart sounds.  Pulmonary/Chest: Effort normal and breath sounds normal.  Abdominal: Soft. Bowel sounds are normal. She exhibits no distension. There is no tenderness.  Musculoskeletal: Normal range of motion. She exhibits no edema.  Neurological: She is alert and oriented to person, place, and time. She displays normal reflexes. Coordination normal.  Skin: Skin is warm and dry. She is not diaphoretic.  Psychiatric: She has a normal mood and affect. Her behavior is normal. Judgment and thought content normal.    Labs reviewed: Basic Metabolic Panel: Recent Labs    02/08/17 1054 05/09/17 0819  NA  --  144  K  --  4.5  CL  --  108  CO2  --  25  GLUCOSE  --  87  BUN 18 17  CREATININE  1.05 1.14*  CALCIUM  --  9.6   Liver Function Tests: Recent Labs    05/09/17 0819  AST 26  ALT 11  ALKPHOS 102  BILITOT 0.5  PROT 6.5  ALBUMIN 4.1   No results for input(s): LIPASE, AMYLASE in the last 8760 hours. No results for input(s): AMMONIA in the last 8760 hours. CBC: Recent Labs    05/09/17 0819  WBC 3.3*  NEUTROABS 1,287*  HGB 12.3  HCT 38.6  MCV  95.8  PLT 284   Lipid Panel: Recent Labs    05/09/17 0819  CHOL 211*  HDL 112  LDLCALC 90  TRIG 47  CHOLHDL 1.9   TSH: No results for input(s): TSH in the last 8760 hours. A1C: Lab Results  Component Value Date   HGBA1C 5.6 07/05/2014     Assessment/Plan 1. Other specified hypothyroidism -on synthroid daily, will follow up TSH - TSH  2. Osteopenia, unspecified location -taking calcium and vit D daily, to increase to twice daily with weight bearing activity as tolerates, limited by OA  3. RLS (restless legs syndrome) Controlled on requip   4. Slow transit constipation -controlled with lifestyle modifications   5. Primary osteoarthritis involving multiple joints -conts to see orthopedic due to pain, uses Voltaren gel as needed and tramadol rarely.  - CBC with Differential/Platelets  . Mixed hyperlipidemia -encouraged dietary changes and exercise as tolerates, limited by arthritis  - Lipid Panel - CMP with eGFR  Next appt: 6 months.  Carlos American. Harle Battiest  [facility] [phone] 8 am - 5 pm) [phone] (after hours)

## 2017-11-15 LAB — LIPID PANEL
Cholesterol: 229 mg/dL — ABNORMAL HIGH (ref <200)
HDL: 99 mg/dL (ref >50)
LDL Cholesterol (Calc): 114 mg/dL (calc) — ABNORMAL HIGH
Non-HDL Cholesterol (Calc): 130 mg/dL (calc) — ABNORMAL HIGH (ref <130)
Total CHOL/HDL Ratio: 2.3 (calc) (ref <5.0)
Triglycerides: 66 mg/dL (ref <150)

## 2017-11-15 LAB — COMPLETE METABOLIC PANEL WITH GFR
AG Ratio: 1.4 (calc) (ref 1.0–2.5)
ALT: 15 U/L (ref 6–29)
AST: 27 U/L (ref 10–35)
Albumin: 3.7 g/dL (ref 3.6–5.1)
Alkaline phosphatase (APISO): 88 U/L (ref 33–130)
BUN/Creatinine Ratio: 13 (calc) (ref 6–22)
BUN: 13 mg/dL (ref 7–25)
CO2: 29 mmol/L (ref 20–32)
Calcium: 9.4 mg/dL (ref 8.6–10.4)
Chloride: 109 mmol/L (ref 98–110)
Creat: 1 mg/dL — ABNORMAL HIGH (ref 0.60–0.93)
GFR, Est African American: 65 mL/min/{1.73_m2} (ref >=60)
GFR, Est Non African American: 56 mL/min/{1.73_m2} — ABNORMAL LOW (ref >=60)
Globulin: 2.6 g/dL (calc) (ref 1.9–3.7)
Glucose, Bld: 91 mg/dL (ref 65–99)
Potassium: 4 mmol/L (ref 3.5–5.3)
Sodium: 145 mmol/L (ref 135–146)
Total Bilirubin: 0.5 mg/dL (ref 0.2–1.2)
Total Protein: 6.3 g/dL (ref 6.1–8.1)

## 2017-11-15 LAB — CBC WITH DIFFERENTIAL/PLATELET
Basophils Absolute: 30 cells/uL (ref 0–200)
Basophils Relative: 0.8 %
Eosinophils Absolute: 41 cells/uL (ref 15–500)
Eosinophils Relative: 1.1 %
HCT: 35.9 % (ref 35.0–45.0)
Hemoglobin: 11.5 g/dL — ABNORMAL LOW (ref 11.7–15.5)
Lymphs Abs: 1469 cells/uL (ref 850–3900)
MCH: 30 pg (ref 27.0–33.0)
MCHC: 32 g/dL (ref 32.0–36.0)
MCV: 93.7 fL (ref 80.0–100.0)
MPV: 12.2 fL (ref 7.5–12.5)
Monocytes Relative: 4.9 %
Neutro Abs: 1980 cells/uL (ref 1500–7800)
Neutrophils Relative %: 53.5 %
Platelets: 229 10*3/uL (ref 140–400)
RBC: 3.83 10*6/uL (ref 3.80–5.10)
RDW: 13 % (ref 11.0–15.0)
Total Lymphocyte: 39.7 %
WBC mixed population: 181 cells/uL — ABNORMAL LOW (ref 200–950)
WBC: 3.7 10*3/uL — ABNORMAL LOW (ref 3.8–10.8)

## 2017-11-15 LAB — TSH: TSH: 0.52 mIU/L (ref 0.40–4.50)

## 2017-11-16 ENCOUNTER — Other Ambulatory Visit: Payer: Self-pay | Admitting: Nurse Practitioner

## 2017-11-16 DIAGNOSIS — E782 Mixed hyperlipidemia: Secondary | ICD-10-CM

## 2017-12-29 ENCOUNTER — Other Ambulatory Visit: Payer: Self-pay | Admitting: Nurse Practitioner

## 2017-12-29 DIAGNOSIS — Z1231 Encounter for screening mammogram for malignant neoplasm of breast: Secondary | ICD-10-CM

## 2018-01-03 ENCOUNTER — Encounter: Payer: Self-pay | Admitting: Nurse Practitioner

## 2018-01-17 ENCOUNTER — Ambulatory Visit
Admission: RE | Admit: 2018-01-17 | Discharge: 2018-01-17 | Disposition: A | Discharge status: Home / Self Care | Payer: Medicare HMO | Source: Ambulatory Visit | Attending: Nurse Practitioner | Admitting: Nurse Practitioner

## 2018-01-17 DIAGNOSIS — Z1231 Encounter for screening mammogram for malignant neoplasm of breast: Secondary | ICD-10-CM

## 2018-01-31 ENCOUNTER — Other Ambulatory Visit: Payer: Self-pay | Admitting: Nurse Practitioner

## 2018-01-31 DIAGNOSIS — G8929 Other chronic pain: Secondary | ICD-10-CM

## 2018-01-31 DIAGNOSIS — M545 Low back pain: Principal | ICD-10-CM

## 2018-01-31 NOTE — Telephone Encounter (Signed)
A medication refill was received from pharmacy for tramadol 50 mg. Rx was called in to pharmacy after verifying last fill date, provider, and quantity on [place] database.   

## 2018-02-01 ENCOUNTER — Other Ambulatory Visit: Payer: Self-pay

## 2018-02-01 MED ORDER — ROPINIROLE HCL 0.25 MG PO TABS: ORAL_TABLET | ORAL | 1 refills | Status: DC

## 2018-02-02 ENCOUNTER — Telehealth: Payer: Self-pay | Admitting: *Deleted

## 2018-02-02 NOTE — Telephone Encounter (Signed)
Received fax from [employer] for Prior authorization for Tramadol. Initiated through [facility].  BIN [id number] Grp [id number] PCN MEDDAET ID: [id number] Ref#: [id number] Went into determination.

## 2018-02-05 NOTE — Telephone Encounter (Signed)
Received fax from [employer] stating Tramadol was APPROVED 10/29/17-10/30/2018 Referral #: [id number] Member ID: Colleen [name]

## 2018-02-07 ENCOUNTER — Telehealth: Payer: Self-pay

## 2018-02-07 ENCOUNTER — Other Ambulatory Visit: Payer: Self-pay

## 2018-02-07 DIAGNOSIS — K862 Cyst of pancreas: Secondary | ICD-10-CM

## 2018-02-07 NOTE — Telephone Encounter (Signed)
Spoke with pt and she is aware. Pt needs a different date for MRI. Pt given the phone number [phone] to call and reschedule the MRI. Pt verbalized understanding.

## 2018-02-07 NOTE — Telephone Encounter (Signed)
Pt scheduled for MRI of abd at [facility] 02/13/18@3pm , pt to arrive there at 2:45pm. Pt to be NPO after 11am. Left message for pt to call back.

## 2018-02-07 NOTE — Telephone Encounter (Signed)
-----   Message from Algernon Huxley, RN sent at 02/14/2017  1:57 PM EDT ----- Regarding: MRI Pt needs repeat MRI in 1 year to look at pancreas again-cysts.

## 2018-02-13 ENCOUNTER — Ambulatory Visit (HOSPITAL_COMMUNITY): Payer: Medicare HMO

## 2018-02-13 ENCOUNTER — Other Ambulatory Visit: Payer: Self-pay | Admitting: Nurse Practitioner

## 2018-02-13 DIAGNOSIS — K219 Gastro-esophageal reflux disease without esophagitis: Secondary | ICD-10-CM

## 2018-02-21 ENCOUNTER — Ambulatory Visit (HOSPITAL_COMMUNITY)
Admission: RE | Admit: 2018-02-21 | Discharge: 2018-02-21 | Disposition: A | Discharge status: Home / Self Care | Payer: Medicare HMO | Source: Ambulatory Visit | Attending: Internal Medicine | Admitting: Internal Medicine

## 2018-02-21 ENCOUNTER — Other Ambulatory Visit: Payer: Self-pay | Admitting: Internal Medicine

## 2018-02-21 DIAGNOSIS — K7689 Other specified diseases of liver: Secondary | ICD-10-CM | POA: Insufficient documentation

## 2018-02-21 DIAGNOSIS — K862 Cyst of pancreas: Secondary | ICD-10-CM

## 2018-02-21 DIAGNOSIS — K5730 Diverticulosis of large intestine without perforation or abscess without bleeding: Secondary | ICD-10-CM | POA: Diagnosis not present

## 2018-02-21 DIAGNOSIS — R935 Abnormal findings on diagnostic imaging of other abdominal regions, including retroperitoneum: Secondary | ICD-10-CM | POA: Diagnosis not present

## 2018-02-21 LAB — POCT I-STAT CREATININE: Creatinine, Ser: 0.9 mg/dL (ref 0.44–1.00)

## 2018-02-21 MED ORDER — GADOBENATE DIMEGLUMINE 529 MG/ML IV SOLN
20.0000 mL | Freq: Once | INTRAVENOUS | Status: AC | PRN
  Administered 2018-02-21: 18 mL via INTRAVENOUS

## 2018-03-30 ENCOUNTER — Other Ambulatory Visit: Payer: Self-pay | Admitting: Nurse Practitioner

## 2018-03-30 DIAGNOSIS — E039 Hypothyroidism, unspecified: Secondary | ICD-10-CM

## 2018-04-27 ENCOUNTER — Other Ambulatory Visit: Payer: Self-pay | Admitting: Nurse Practitioner

## 2018-04-27 DIAGNOSIS — K219 Gastro-esophageal reflux disease without esophagitis: Secondary | ICD-10-CM

## 2018-04-30 ENCOUNTER — Ambulatory Visit: Payer: Medicare HMO | Admitting: Nurse Practitioner

## 2018-04-30 ENCOUNTER — Ambulatory Visit: Payer: Self-pay | Admitting: Nurse Practitioner

## 2018-05-07 ENCOUNTER — Ambulatory Visit: Payer: Self-pay | Admitting: Nurse Practitioner

## 2018-06-06 DIAGNOSIS — S6992XA Unspecified injury of left wrist, hand and finger(s), initial encounter: Secondary | ICD-10-CM | POA: Diagnosis not present

## 2018-06-06 DIAGNOSIS — I10 Essential (primary) hypertension: Secondary | ICD-10-CM | POA: Diagnosis not present

## 2018-06-06 DIAGNOSIS — R9431 Abnormal electrocardiogram [ECG] [EKG]: Secondary | ICD-10-CM | POA: Diagnosis not present

## 2018-06-12 ENCOUNTER — Telehealth: Payer: Self-pay | Admitting: *Deleted

## 2018-06-12 DIAGNOSIS — S161XXA Strain of muscle, fascia and tendon at neck level, initial encounter: Secondary | ICD-10-CM

## 2018-06-12 DIAGNOSIS — S66912A Strain of unspecified muscle, fascia and tendon at wrist and hand level, left hand, initial encounter: Secondary | ICD-10-CM | POA: Diagnosis not present

## 2018-06-12 DIAGNOSIS — M25562 Pain in left knee: Secondary | ICD-10-CM | POA: Diagnosis not present

## 2018-06-12 DIAGNOSIS — S8012XA Contusion of left lower leg, initial encounter: Secondary | ICD-10-CM | POA: Diagnosis not present

## 2018-06-12 DIAGNOSIS — S8010XA Contusion of unspecified lower leg, initial encounter: Secondary | ICD-10-CM | POA: Insufficient documentation

## 2018-06-12 HISTORY — DX: Strain of muscle, fascia and tendon at neck level, initial encounter: S16.1XXA

## 2018-06-12 HISTORY — DX: Contusion of unspecified lower leg, initial encounter: S80.10XA
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
## 2018-06-19 NOTE — Telephone Encounter (Signed)
I called patient to follow-up for encounter was still open. Patient states that she contacted her orthopaedic specialist and was seen same day for concerns stated. Patient was in no distress at the time of call and did not have any additional concerns.   Patient confirmed pending appointment with Janett Billow for 08/06/18 @ 10:15 am. Patient asked if it was indicated that she needed labs and per AVS from Jan 2019 visit no indication that labs are needed.

## 2018-06-22 ENCOUNTER — Telehealth: Payer: Self-pay
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
## 2018-06-26 ENCOUNTER — Ambulatory Visit: Payer: Medicare HMO | Admitting: Family Medicine

## 2018-06-26 ENCOUNTER — Encounter: Payer: Self-pay | Admitting: Family Medicine

## 2018-06-26 DIAGNOSIS — S134XXA Sprain of ligaments of cervical spine, initial encounter: Secondary | ICD-10-CM | POA: Insufficient documentation

## 2018-06-26 DIAGNOSIS — S0990XA Unspecified injury of head, initial encounter: Secondary | ICD-10-CM

## 2018-06-26 HISTORY — DX: Sprain of ligaments of cervical spine, initial encounter: S13.4XXA

## 2018-06-26 NOTE — Patient Instructions (Signed)
Good to see you  Fish oil 2 grams daily  Vitamin D 4000 IU daily  Tart cherry extract any hdose at night Choline 500mg  daily can help with cognition  Arnica lotion to the knee will help the bruise.   See me again in 2-3 weeks and we will discuss if we need physical therapy

## 2018-06-26 NOTE — Assessment & Plan Note (Signed)
Patient did have a head injury.  I do believe that patient still has whiplash that is likely contributing to most of patient's aches and planes.  Patient still having some difficulty with confusion.  We discussed with patient that certain medications such as her muscle relaxer likely is contributing to most of the discomfort and pain.  Patient is with her daughter who states that the muscle relaxer is the only way the patient sleeps.  We discussed the possibility of gabapentin which they declined at the moment.  We discussed icing regimen and home exercises and patient given exercises.  I do not believe that there is a true concussion at this point.  I would like patient to follow-up on 2 to 3 weeks to make sure she improves.

## 2018-06-27 DIAGNOSIS — S8012XA Contusion of left lower leg, initial encounter: Secondary | ICD-10-CM | POA: Diagnosis not present

## 2018-06-27 DIAGNOSIS — M25562 Pain in left knee: Secondary | ICD-10-CM | POA: Diagnosis not present

## 2018-07-16 NOTE — Progress Notes (Signed)
[name] [address] [place], [place] [address] Phone: [phone] Subjective:   [name], am serving as a scribe for Dr. Hulan Saas.  I'm seeing this patient by the request  of:    CC: Head injury and neck pain follow-up  [id number]  Colleen [name] is a 72 y.o. female coming in with complaint of neck pain following a head injury. She did have some improvement since we last saw her. No headaches. She said that on Thursday the pain she was having in the left side of her neck transferred to the right side. She said that pain aches into the back and side of neck. Has been using a heating pad. Tramadol is not helping her pain. Does regularly perform HEP and this helped the left side of neck.   Did not seem to be more of a concussion and seemed to be more of a whiplash.  Patient states that the left side seem to be doing better but the right side seems to be worsening.  More tightness.  Seems to go from the base of the skull down to the her right shoulder.  No radiation down the hands and no numbness.     Past Medical History:  Diagnosis Date  . Allergic rhinitis   . Anxiety   . Arthritis   . Colon polyps    adenomatous  . Corns and callosities   . Diverticulosis   . GERD (gastroesophageal reflux disease)   . Glaucoma   . Hypercholesteremia   . Hypothyroidism   . Obesity   . Screening for diabetes mellitus   . Urge incontinence    Past Surgical History:  Procedure Laterality Date  . ABDOMINAL HYSTERECTOMY  1985   Dr Mancel Bale  . BUNIONECTOMY Right    Dr Jomarie Longs  . CARPAL TUNNEL RELEASE Bilateral 2010   Dr Laurance Flatten  . CESAREAN SECTION  1983  . COLONOSCOPY    . ROTATOR CUFF REPAIR Right    Dr Theda Sers  . THYROIDECTOMY  1987   Dr Guadlupe Spanish  . TONSILLECTOMY  1951   Social History   Socioeconomic History  . Marital status: Widowed    Spouse name: Not on file  . Number of children: 1  . Years of education: Not on file  . Highest  education level: Not on file  Occupational History  . Occupation: retired  Scientific laboratory technician  . Financial resource strain: Not hard at all  . Food insecurity:    Worry: Never true    Inability: Never true  . Transportation needs:    Medical: No    Non-medical: No  Tobacco Use  . Smoking status: Former Smoker    Types: Cigarettes    Last attempt to quit: 10/31/1986    Years since quitting: 31.7  . Smokeless tobacco: Never Used  Substance and Sexual Activity  . Alcohol use: No    Alcohol/week: 0.0 standard drinks  . Drug use: No  . Sexual activity: Never  Lifestyle  . Physical activity:    Days per week: 0 days    Minutes per session: 0 min  . Stress: Not at all  Relationships  . Social connections:    Talks on phone: More than three times a week    Gets together: More than three times a week    Attends religious service: 1 to 4 times per year    Active member of club or organization: No    Attends meetings of  clubs or organizations: Never    Relationship status: Widowed  Other Topics Concern  . Not on file  Social History Narrative  . Not on file   No Known Allergies Family History  Problem Relation Age of Onset  . Leukemia Mother 86  . Colon cancer Mother   . Lung cancer Father   . Diabetes Brother   . COPD Brother   . Lung cancer Brother   . Diabetes Maternal Grandmother   . Cancer Maternal Grandfather        unknown type  . Breast cancer Maternal Aunt     Current Outpatient Medications (Endocrine & Metabolic):  .  levothyroxine (SYNTHROID, LEVOTHROID) 50 MCG tablet, TAKE 1 TABLET BY MOUTH ONCE DAILY    Current Outpatient Medications (Analgesics):  .  acetaminophen (TYLENOL) 500 MG tablet, Take 1,000 mg by mouth every 6 (six) hours as needed for mild pain. Marland Kitchen  aspirin 81 MG tablet, Take 81 mg by mouth daily. .  traMADol (ULTRAM) 50 MG tablet, TAKE 1 TABLET BY MOUTH EVERY 6 HOURS IF NEEDED   Current Outpatient Medications (Other):  Marland Kitchen  Ascorbic Acid (VITAMIN  C PO), Take 1 tablet by mouth daily. Marland Kitchen  CALCIUM-VITAMIN D PO, Take 2 tablets by mouth daily.  .  cephALEXin (KEFLEX) 500 MG capsule, take 1 capsule by mouth once daily .  diclofenac sodium (VOLTAREN) 1 % GEL, Apply 2 g topically 2 (two) times daily. .  hydroxypropyl methylcellulose / hypromellose (ISOPTO TEARS / GONIOVISC) 2.5 % ophthalmic solution, Place 1 drop into both eyes 4 (four) times daily as needed for dry eyes. .  Multiple Vitamin (MULTIVITAMIN WITH MINERALS) TABS, Take 1 tablet by mouth daily. .  ranitidine (ZANTAC) 150 MG tablet, TAKE 1 TABLET BY MOUTH TWICE A DAY .  rOPINIRole (REQUIP) 0.25 MG tablet, TAKE 1 TABLET BY MOUTH ONCE DAILY AFTER SUPPER. (TAKE 1-2 HOURS BEFORE GOING TO BED) .  tizanidine (ZANAFLEX) 2 MG capsule, Take 1 capsule (2 mg total) by mouth 3 (three) times daily as needed for muscle spasms.    Past medical history, social, surgical and family history all reviewed in electronic medical record.  No pertanent information unless stated regarding to the chief complaint.   Review of Systems:  No  visual changes, nausea, vomiting, diarrhea, constipation, dizziness, abdominal pain, skin rash, fevers, chills, night sweats, weight loss, swollen lymph nodes, body aches, joint swelling,chest pain, shortness of breath, mood changes.  Positive muscle aches and headache  Objective  Blood pressure 128/82, height 5\' 1"  (1.549 m), weight 184 lb (83.5 kg).    General: No apparent distress alert and oriented x3 mood and affect normal, dressed appropriately.  HEENT: Pupils equal, extraocular movements intact  Respiratory: Patient's speak in full sentences and does not appear short of breath  Cardiovascular: No lower extremity edema, non tender, no erythema  Skin: Warm dry intact with no signs of infection or rash on extremities or on axial skeleton.  Abdomen: Soft nontender  Neuro: Cranial nerves II through XII are intact, neurovascularly intact in all extremities with 2+ DTRs  and 2+ pulses.  Lymph: No lymphadenopathy of posterior or anterior cervical chain or axillae bilaterally.  Gait normal with good balance and coordination.  MSK:  Non tender with full range of motion and good stability and symmetric strength and tone of shoulders, elbows, wrist, hip, knee and ankles bilaterally.  Arthritic changes of multiple joints neck: Inspection significant loss of lordosis. No palpable stepoffs. Negative Spurling's maneuver. Limited right-sided rotation  by 10 degrees and limited left-sided rotation by 5 degrees.  Extension is also limited by 5 degrees.  Full flexion noted. Grip strength and sensation normal in bilateral hands Strength good C4 to T1 distribution No sensory change to C4 to T1 Negative Hoffman sign bilaterally Reflexes normal Significant tightness noted with multiple trigger points in the right shoulder region  After verbal consent patient was prepped with alcohol swabs and with a 25-gauge half inch needle injected with 3 cc of 0.5% Marcaine and 1 cc of Kenalog 40 mg/mL and 4 distinct trigger points in the right shoulder and trapezius region.  No blood loss.  Postinjection instructions given.   '    Impression and Recommendations:     This case required medical decision making of moderate complexity. The above documentation has been reviewed and is accurate and complete Lyndal Pulley, DO       Note: This dictation was prepared with Dragon dictation along with smaller phrase technology. Any transcriptional errors that result from this process are unintentional.

## 2018-07-17 ENCOUNTER — Ambulatory Visit: Payer: Medicare HMO | Admitting: Family Medicine

## 2018-07-17 ENCOUNTER — Encounter: Payer: Self-pay | Admitting: Family Medicine

## 2018-07-17 VITALS — BP 128/82 | Ht 61.0 in | Wt 184.0 lb

## 2018-07-17 DIAGNOSIS — M25511 Pain in right shoulder: Secondary | ICD-10-CM | POA: Diagnosis not present

## 2018-07-17 DIAGNOSIS — M542 Cervicalgia: Secondary | ICD-10-CM | POA: Diagnosis not present

## 2018-07-17 DIAGNOSIS — S134XXD Sprain of ligaments of cervical spine, subsequent encounter: Secondary | ICD-10-CM | POA: Diagnosis not present

## 2018-07-17 NOTE — Assessment & Plan Note (Signed)
Continues to have symptoms more of whiplash.  I do not see any type of confusion at this moment.  Do not believe that the neck pain can cause some of the intermittent headaches.  Patient will be sent to formal physical therapy with her facility of choice.  We discussed that this could be beneficial to help with the range of motion.  Patient did respond well to trigger point injections and I think will do well.  Follow-up with me again in 4  weeks

## 2018-07-17 NOTE — Assessment & Plan Note (Signed)
Injections given today, icing regimen is suggested, posture and ergonomics, sent to physical therapy.  Follow-up again in 4 weeks

## 2018-07-17 NOTE — Patient Instructions (Addendum)
Good to see you  Ice is your friend Stay active Tried some trigger point injections today to help  Continue other modalities.  Physical therapy will be calling you  Lets get a neck xray as well  See me again in 3 weeks

## 2018-07-18 ENCOUNTER — Ambulatory Visit (INDEPENDENT_AMBULATORY_CARE_PROVIDER_SITE_OTHER)
Admission: RE | Admit: 2018-07-18 | Discharge: 2018-07-18 | Disposition: A | Discharge status: Home / Self Care | Payer: Medicare HMO | Source: Ambulatory Visit | Attending: Family Medicine | Admitting: Family Medicine

## 2018-07-18 DIAGNOSIS — M542 Cervicalgia: Secondary | ICD-10-CM | POA: Diagnosis not present

## 2018-07-31 DIAGNOSIS — M542 Cervicalgia: Secondary | ICD-10-CM | POA: Diagnosis not present

## 2018-08-06 ENCOUNTER — Encounter: Payer: Self-pay | Admitting: Nurse Practitioner

## 2018-08-06 ENCOUNTER — Ambulatory Visit: Payer: Medicare HMO | Admitting: Nurse Practitioner

## 2018-08-06 VITALS — BP 122/78 | HR 64 | Temp 98.0°F | Ht 61.0 in | Wt 174.9 lb

## 2018-08-06 DIAGNOSIS — M545 Low back pain: Secondary | ICD-10-CM

## 2018-08-06 DIAGNOSIS — G2581 Restless legs syndrome: Secondary | ICD-10-CM

## 2018-08-06 DIAGNOSIS — G8929 Other chronic pain: Secondary | ICD-10-CM

## 2018-08-06 DIAGNOSIS — N390 Urinary tract infection, site not specified: Secondary | ICD-10-CM | POA: Diagnosis not present

## 2018-08-06 DIAGNOSIS — M159 Polyosteoarthritis, unspecified: Secondary | ICD-10-CM

## 2018-08-06 DIAGNOSIS — Z23 Encounter for immunization: Secondary | ICD-10-CM

## 2018-08-06 DIAGNOSIS — E782 Mixed hyperlipidemia: Secondary | ICD-10-CM

## 2018-08-06 DIAGNOSIS — M150 Primary generalized (osteo)arthritis: Secondary | ICD-10-CM | POA: Diagnosis not present

## 2018-08-06 DIAGNOSIS — E785 Hyperlipidemia, unspecified: Secondary | ICD-10-CM | POA: Diagnosis not present

## 2018-08-06 DIAGNOSIS — K219 Gastro-esophageal reflux disease without esophagitis: Secondary | ICD-10-CM | POA: Diagnosis not present

## 2018-08-06 DIAGNOSIS — E038 Other specified hypothyroidism: Secondary | ICD-10-CM | POA: Diagnosis not present

## 2018-08-06 DIAGNOSIS — M8580 Other specified disorders of bone density and structure, unspecified site: Secondary | ICD-10-CM

## 2018-08-06 LAB — CBC WITH DIFFERENTIAL/PLATELET
Basophils Absolute: 29 cells/uL (ref 0–200)
Basophils Relative: 0.7 %
Eosinophils Absolute: 50 cells/uL (ref 15–500)
Eosinophils Relative: 1.2 %
HCT: 40.1 % (ref 35.0–45.0)
Hemoglobin: 12.9 g/dL (ref 11.7–15.5)
Lymphs Abs: 1890 cells/uL (ref 850–3900)
MCH: 30.4 pg (ref 27.0–33.0)
MCHC: 32.2 g/dL (ref 32.0–36.0)
MCV: 94.4 fL (ref 80.0–100.0)
MPV: 12.6 fL — ABNORMAL HIGH (ref 7.5–12.5)
Monocytes Relative: 6.4 %
Neutro Abs: 1961 cells/uL (ref 1500–7800)
Neutrophils Relative %: 46.7 %
Platelets: 224 10*3/uL (ref 140–400)
RBC: 4.25 10*6/uL (ref 3.80–5.10)
RDW: 12.2 % (ref 11.0–15.0)
Total Lymphocyte: 45 %
WBC mixed population: 269 cells/uL (ref 200–950)
WBC: 4.2 10*3/uL (ref 3.8–10.8)

## 2018-08-06 LAB — COMPLETE METABOLIC PANEL WITH GFR
AG Ratio: 1.6 (calc) (ref 1.0–2.5)
ALT: 18 U/L (ref 6–29)
AST: 33 U/L (ref 10–35)
Albumin: 4.4 g/dL (ref 3.6–5.1)
Alkaline phosphatase (APISO): 69 U/L (ref 33–130)
BUN/Creatinine Ratio: 20 (calc) (ref 6–22)
BUN: 22 mg/dL (ref 7–25)
CO2: 28 mmol/L (ref 20–32)
Calcium: 10 mg/dL (ref 8.6–10.4)
Chloride: 103 mmol/L (ref 98–110)
Creat: 1.09 mg/dL — ABNORMAL HIGH (ref 0.60–0.93)
GFR, Est African American: 58 mL/min/{1.73_m2} — ABNORMAL LOW (ref >=60)
GFR, Est Non African American: 50 mL/min/{1.73_m2} — ABNORMAL LOW (ref >=60)
Globulin: 2.7 g/dL (calc) (ref 1.9–3.7)
Glucose, Bld: 79 mg/dL (ref 65–99)
Potassium: 4.1 mmol/L (ref 3.5–5.3)
Sodium: 141 mmol/L (ref 135–146)
Total Bilirubin: 0.5 mg/dL (ref 0.2–1.2)
Total Protein: 7.1 g/dL (ref 6.1–8.1)

## 2018-08-06 LAB — LIPID PANEL
Cholesterol: 240 mg/dL — ABNORMAL HIGH (ref <200)
HDL: 93 mg/dL (ref >50)
LDL Cholesterol (Calc): 131 mg/dL (calc) — ABNORMAL HIGH
Non-HDL Cholesterol (Calc): 147 mg/dL (calc) — ABNORMAL HIGH (ref <130)
Total CHOL/HDL Ratio: 2.6 (calc) (ref <5.0)
Triglycerides: 67 mg/dL (ref <150)

## 2018-08-06 MED ORDER — TRAMADOL HCL 50 MG PO TABS: ORAL_TABLET | ORAL | 0 refills | Status: DC
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
## 2018-08-07 ENCOUNTER — Other Ambulatory Visit: Payer: Self-pay

## 2018-08-07 DIAGNOSIS — E785 Hyperlipidemia, unspecified: Secondary | ICD-10-CM

## 2018-08-07 LAB — TSH: TSH: 1.21 mIU/L (ref 0.40–4.50)

## 2018-08-08 ENCOUNTER — Other Ambulatory Visit: Payer: Self-pay | Admitting: *Deleted

## 2018-08-08 NOTE — Telephone Encounter (Signed)
Patient called and stated that she saw Jessica on 10/7 and Tramadol was to be sent to pharmacy but pharmacy does not have the Rx.   Spoke with Pharmacist at [facility] and called in Rx.
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
## 2018-08-15 ENCOUNTER — Encounter: Payer: Self-pay | Admitting: Family Medicine

## 2018-08-15 ENCOUNTER — Ambulatory Visit: Payer: Medicare HMO | Admitting: Family Medicine

## 2018-08-15 DIAGNOSIS — S134XXD Sprain of ligaments of cervical spine, subsequent encounter: Secondary | ICD-10-CM | POA: Diagnosis not present
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
## 2018-08-16 ENCOUNTER — Telehealth: Payer: Self-pay | Admitting: *Deleted

## 2018-08-16 NOTE — Telephone Encounter (Signed)
Patient called and requested labs to be mailed to her. Printed and mailed to home address as requested.

## 2018-08-22 DIAGNOSIS — M542 Cervicalgia: Secondary | ICD-10-CM | POA: Diagnosis not present

## 2018-08-27 ENCOUNTER — Other Ambulatory Visit: Payer: Self-pay | Admitting: Nurse Practitioner

## 2018-09-14 ENCOUNTER — Ambulatory Visit: Payer: Medicare HMO | Admitting: Gastroenterology

## 2018-09-14 ENCOUNTER — Encounter: Payer: Self-pay | Admitting: Gastroenterology

## 2018-09-14 ENCOUNTER — Other Ambulatory Visit (INDEPENDENT_AMBULATORY_CARE_PROVIDER_SITE_OTHER): Payer: Medicare HMO

## 2018-09-14 VITALS — BP 120/60 | HR 64 | Ht 61.0 in | Wt 179.0 lb

## 2018-09-14 DIAGNOSIS — R1033 Periumbilical pain: Secondary | ICD-10-CM | POA: Insufficient documentation

## 2018-09-14 DIAGNOSIS — R1032 Left lower quadrant pain: Secondary | ICD-10-CM | POA: Diagnosis not present

## 2018-09-14 DIAGNOSIS — K5909 Other constipation: Secondary | ICD-10-CM | POA: Diagnosis not present

## 2018-09-14 LAB — BASIC METABOLIC PANEL
BUN: 17 mg/dL (ref 6–23)
CO2: 30 mEq/L (ref 19–32)
Calcium: 9.9 mg/dL (ref 8.4–10.5)
Chloride: 108 mEq/L (ref 96–112)
Creatinine, Ser: 0.98 mg/dL (ref 0.40–1.20)
GFR: 71.27 mL/min (ref >60.00)
Glucose, Bld: 101 mg/dL — ABNORMAL HIGH (ref 70–99)
Potassium: 4.4 mEq/L (ref 3.5–5.1)
Sodium: 143 mEq/L (ref 135–145)

## 2018-09-14 MED ORDER — LINACLOTIDE 145 MCG PO CAPS: 145.0000 ug | ORAL_CAPSULE | Freq: Every day | ORAL | 2 refills | Status: DC

## 2018-09-14 NOTE — Progress Notes (Signed)
Physician assistant assessment and plans reviewed 

## 2018-09-14 NOTE — Patient Instructions (Addendum)
Your provider has requested that you go to the basement level for lab work before leaving today. Press "B" on the elevator. The lab is located at the first door on the left as you exit the elevator.  We have provided you with Linzess 145 mcg samples.3 We also sent a prescription to your pharmacy.  [facility] [address].     You have been scheduled for a CT scan of the abdomen and pelvis at [facility].    You are scheduled on Wednesday 09-19-2018  at 1:30 PM. You should arrive at 1:15 Pm  to your appointment time for registration. Please follow the written instructions below on the day of your exam:  WARNING: IF YOU ARE ALLERGIC TO IODINE/X-RAY DYE, PLEASE NOTIFY RADIOLOGY IMMEDIATELY AT [phone]! YOU WILL BE GIVEN A 13 HOUR PREMEDICATION PREP.  1) Do not eat anything after 10:30 am (4 hours prior to your test) 2) You have been given 2 bottles of oral contrast to drink. The solution may taste better if refrigerated, but do NOT add ice or any other liquid to this solution. Shake well before drinking.    Drink 1 bottle of contrast @ 11:30 AM (2 hours prior to your exam)  Drink 1 bottle of contrast @ 12:30 PM (1 hour prior to your exam)  You may take any medications as prescribed with a small amount of water, if necessary. If you take any of the following medications: METFORMIN, GLUCOPHAGE, GLUCOVANCE, AVANDAMET, RIOMET, FORTAMET, [place] MET, JANUMET, GLUMETZA or METAGLIP, you MAY be asked to HOLD this medication 48 hours AFTER the exam.  The purpose of you drinking the oral contrast is to aid in the visualization of your intestinal tract. The contrast solution may cause some diarrhea. Depending on your individual set of symptoms, you may also receive an intravenous injection of x-ray contrast/dye. Plan on being at [facility] for 30 minutes or longer, depending on the type of exam you are having performed.  This test typically takes 30-45  minutes to complete.  If you have any questions regarding your exam or if you need to reschedule, you may call the CT department at [phone] between the hours of 8:00 am and 5:00 pm, Monday-Friday.  ________________________________________________________________________

## 2018-09-14 NOTE — Progress Notes (Signed)
09/14/2018 Colleen [name] [id number] [birth year]   HISTORY OF PRESENT ILLNESS: This is a 72 year old female who is a patient of Dr. Blanch Media.  She has not been seen here since April 2016.  She presents here today with complaints of abdominal pain and constipation.  She describes left lower quadrant and periumbilical abdominal pain for the past 3 weeks.  Seems to hurt most when she is having a bowel movement.  She denies any associated nausea, vomiting, fevers, chills.  Does not hurt to eat.  Appetite is good.  She does admit to long-standing constipation.  She admits to only passing rabbit pellet type stools.  She has tried fleets enemas and as needed lactulose.  She tried MiraLAX once daily, but did not seem to make a difference.  She has been taking mineral oil more regularly, but not much results with that either.  She denies any rectal bleeding.  Her last colonoscopy was in April 2013 in [place].  At that time she was found to have some polyps removed, at least 1 of which was a tubular adenoma.  Repeat was recommended 5-year interval.   Past Medical History:  Diagnosis Date  . Allergic rhinitis   . Anxiety   . Arthritis   . Colon polyps    adenomatous  . Corns and callosities   . Diverticulosis   . GERD (gastroesophageal reflux disease)   . Glaucoma   . Hypercholesteremia   . Hypothyroidism   . Obesity   . Screening for diabetes mellitus   . Urge incontinence    Past Surgical History:  Procedure Laterality Date  . ABDOMINAL HYSTERECTOMY  1985   Dr Mancel Bale  . BUNIONECTOMY Right    Dr Jomarie Longs  . CARPAL TUNNEL RELEASE Bilateral 2010   Dr Laurance Flatten  . CESAREAN SECTION  1983  . COLONOSCOPY    . ROTATOR CUFF REPAIR Right    Dr Theda Sers  . THYROIDECTOMY  1987   Dr Guadlupe Spanish  . TONSILLECTOMY  1951    reports that she quit smoking about 31 years ago. Her smoking use included cigarettes. She has never used smokeless tobacco. She reports that she does not drink alcohol or use  drugs. family history includes Breast cancer in her maternal aunt; COPD in her brother; Cancer in her maternal grandfather; Colon cancer in her mother; Diabetes in her brother and maternal grandmother; Leukemia (age of onset: 74) in her mother; Lung cancer in her brother and father. No Known Allergies    Outpatient Encounter Medications as of 09/14/2018  Medication Sig  . acetaminophen (TYLENOL) 500 MG tablet Take 1,000 mg by mouth every 6 (six) hours as needed for mild pain.  . Ascorbic Acid (VITAMIN C PO) Take 1 tablet by mouth daily.  Marland Kitchen aspirin 81 MG tablet Take 81 mg by mouth daily.  Marland Kitchen CALCIUM-VITAMIN D PO Take 2 tablets by mouth daily.   . cephALEXin (KEFLEX) 500 MG capsule take 1 capsule by mouth once daily  . diclofenac sodium (VOLTAREN) 1 % GEL Apply 2 g topically 2 (two) times daily.  . hydroxypropyl methylcellulose / hypromellose (ISOPTO TEARS / GONIOVISC) 2.5 % ophthalmic solution Place 1 drop into both eyes 4 (four) times daily as needed for dry eyes.  Marland Kitchen levothyroxine (SYNTHROID, LEVOTHROID) 50 MCG tablet TAKE 1 TABLET BY MOUTH ONCE DAILY  . Multiple Vitamin (MULTIVITAMIN WITH MINERALS) TABS Take 1 tablet by mouth daily.  Marland Kitchen rOPINIRole (REQUIP) 0.25 MG tablet TAKE 1 TABLET BY MOUTH EVERY DAY AFTER SUPPER.  TAKE 1 TO 2 HOURS BEFORE GOING TO BEDTIME  . tizanidine (ZANAFLEX) 2 MG capsule Take 1 capsule (2 mg total) by mouth 3 (three) times daily as needed for muscle spasms.  . traMADol (ULTRAM) 50 MG tablet TAKE 1 TABLET BY MOUTH EVERY 6 HOURS IF NEEDED  . linaclotide (LINZESS) 145 MCG CAPS capsule Take 1 capsule (145 mcg total) by mouth daily before breakfast.   No facility-administered encounter medications on file as of 09/14/2018.      REVIEW OF SYSTEMS  : All other systems reviewed and negative except where noted in the History of Present Illness.   PHYSICAL EXAM: BP 120/60 (BP Location: Left Arm, Patient Position: Sitting, Cuff Size: Normal)   Pulse 64   Ht 5\' 1"  (1.549 m)    Wt 179 lb (81.2 kg)   BMI 33.82 kg/m  General: Well developed black female in no acute distress Head: Normocephalic and atraumatic Eyes:  Sclerae anicteric, conjunctiva pink. Ears: Normal auditory acuity Lungs: Clear throughout to auscultation; no increased WOB. Heart: Regular rate and rhythm; no M/R/G. Abdomen: Soft, non-distended.  BS present.  Mild LLQ TTP. Musculoskeletal: Symmetrical with no gross deformities  Skin: No lesions on visible extremities Extremities: No edema  Neurological: Alert oriented x 4, grossly non-focal Psychological:  Alert and cooperative. Normal mood and affect  ASSESSMENT AND PLAN: *LLQ abdominal pain and some periumbilical abdominal pain:  Present for the past 3 weeks or so.  ? Low-grade diverticulitis.  Or possibly due to her constipation.  Will check CT scan abdomen and pelvis with contrast to rule out diverctiulitis, etc. *Chronic constipation:  Needs a regular regimen.  Will try Linzess 145 mcg daily (samples and prescription given). *Personal history of colon polyps:  Last colonoscopy 01/2012 with at least one tubular adenoma.  Due for repeat.  Will discuss and schedule this after CT scan pending results, etc.   CC:  [name], NP

## 2018-09-19 ENCOUNTER — Ambulatory Visit (HOSPITAL_COMMUNITY)
Admission: RE | Admit: 2018-09-19 | Discharge: 2018-09-19 | Disposition: A | Discharge status: Home / Self Care | Payer: Medicare HMO | Source: Ambulatory Visit | Attending: Gastroenterology | Admitting: Gastroenterology

## 2018-09-19 ENCOUNTER — Encounter (HOSPITAL_COMMUNITY): Payer: Self-pay

## 2018-09-19 DIAGNOSIS — K869 Disease of pancreas, unspecified: Secondary | ICD-10-CM | POA: Diagnosis not present

## 2018-09-19 DIAGNOSIS — R1033 Periumbilical pain: Secondary | ICD-10-CM | POA: Insufficient documentation

## 2018-09-19 DIAGNOSIS — K5900 Constipation, unspecified: Secondary | ICD-10-CM | POA: Insufficient documentation

## 2018-09-19 DIAGNOSIS — R1032 Left lower quadrant pain: Secondary | ICD-10-CM | POA: Diagnosis present

## 2018-09-19 MED ORDER — IOHEXOL 300 MG/ML  SOLN
100.0000 mL | Freq: Once | INTRAMUSCULAR | Status: AC | PRN
  Administered 2018-09-19: 100 mL via INTRAVENOUS

## 2018-09-19 MED ORDER — SODIUM CHLORIDE (PF) 0.9 % IJ SOLN
INTRAMUSCULAR | Status: AC
  Filled 2018-09-19: qty 50

## 2018-09-20 ENCOUNTER — Telehealth: Payer: Self-pay | Admitting: Gastroenterology

## 2018-09-20 NOTE — Telephone Encounter (Signed)
See results note. 

## 2018-09-20 NOTE — Telephone Encounter (Signed)
Pt left message with answering message. She is looking for test results.

## 2018-09-22 ENCOUNTER — Other Ambulatory Visit: Payer: Self-pay | Admitting: Nurse Practitioner

## 2018-09-22 DIAGNOSIS — E039 Hypothyroidism, unspecified: Secondary | ICD-10-CM

## 2018-10-01 ENCOUNTER — Encounter: Payer: Self-pay | Admitting: Family Medicine

## 2018-10-01 ENCOUNTER — Ambulatory Visit: Payer: Medicare HMO | Admitting: Family Medicine

## 2018-10-01 DIAGNOSIS — S134XXD Sprain of ligaments of cervical spine, subsequent encounter: Secondary | ICD-10-CM | POA: Diagnosis not present
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
## 2018-10-01 NOTE — Assessment & Plan Note (Signed)
Much better overall.  Discussed icing regimen and home exercise.  Discussed topical anti-inflammatories.  Discussed avoiding certain activities.  Follow-up again in 4 to 8 weeks.

## 2018-10-01 NOTE — Patient Instructions (Signed)
Good to see you  Colleen [name] is your friend Keep trucking along Can tell your insurance that you are at maximal medical improvement.  See me when you need me and call [phone] when you do need [place] Happy holidays!

## 2018-10-17 ENCOUNTER — Ambulatory Visit (AMBULATORY_SURGERY_CENTER): Payer: Self-pay

## 2018-10-17 VITALS — Ht 62.0 in | Wt 180.4 lb

## 2018-10-17 DIAGNOSIS — R1032 Left lower quadrant pain: Secondary | ICD-10-CM

## 2018-10-17 DIAGNOSIS — K5909 Other constipation: Secondary | ICD-10-CM

## 2018-10-17 MED ORDER — NA SULFATE-K SULFATE-MG SULF 17.5-3.13-1.6 GM/177ML PO SOLN: 1.0000 | Freq: Once | ORAL | 0 refills | Status: AC

## 2018-10-17 NOTE — Progress Notes (Signed)
Per pt, no allergies to soy or egg products.Pt not taking any weight loss meds or using  O2 at home.  Pt refused emmi video. 

## 2018-10-19 ENCOUNTER — Encounter: Payer: Self-pay | Admitting: Internal Medicine

## 2018-10-29 ENCOUNTER — Other Ambulatory Visit: Payer: Self-pay | Admitting: Nurse Practitioner

## 2018-10-29 ENCOUNTER — Ambulatory Visit (INDEPENDENT_AMBULATORY_CARE_PROVIDER_SITE_OTHER): Payer: Medicare HMO

## 2018-10-29 VITALS — BP 122/60 | HR 63 | Temp 97.9°F | Ht 62.0 in | Wt 179.0 lb

## 2018-10-29 DIAGNOSIS — E2839 Other primary ovarian failure: Secondary | ICD-10-CM

## 2018-10-29 DIAGNOSIS — Z0000 Encounter for general adult medical examination without abnormal findings: Secondary | ICD-10-CM | POA: Diagnosis not present

## 2018-10-29 DIAGNOSIS — Z1231 Encounter for screening mammogram for malignant neoplasm of breast: Secondary | ICD-10-CM

## 2018-10-29 NOTE — Progress Notes (Signed)
Subjective:   Colleen [name] is a 72 y.o. female who presents for Medicare Annual (Subsequent) preventive examination.  Last AWV-10/26/2017    Objective:     Vitals: BP 122/60 (BP Location: Left Arm, Patient Position: Sitting)   Pulse 63   Temp 97.9 F (36.6 C) (Oral)   Ht 5\' 2"  (1.575 m)   Wt 179 lb (81.2 kg)   SpO2 96%   BMI 32.74 kg/m   Body mass index is 32.74 kg/m.  Advanced Directives 10/29/2018 08/06/2018 10/26/2017 05/12/2017 10/27/2016 10/20/2016 04/21/2016  Does Patient Have a Medical Advance Directive? Yes No Yes No Yes Yes Yes  Type of Paramedic of [place];Living will - Healthcare Power of [place] will  Does patient want to make changes to medical advance directive? No - Patient declined - No - Patient declined - - - -  Copy of [place] in Chart? No - copy requested - Yes - Yes Yes Yes  Would patient like information on creating a medical advance directive? - - - - - - -    Tobacco Social History   Tobacco Use  Smoking Status Former Smoker  . Types: Cigarettes  . Last attempt to quit: 10/31/1986  . Years since quitting: 32.0  Smokeless Tobacco Never Used     Counseling given: Not Answered   Clinical Intake:  Pre-visit preparation completed: No  Pain : No/denies pain     Nutritional Risks: None Diabetes: No  How often do you need to have someone help you when you read instructions, pamphlets, or other written materials from your doctor or pharmacy?: 1 - Never What is the last grade level you completed in school?: 10th grade  Interpreter Needed?: No  Information entered by :: Tyson Dense, RN  Past Medical History:  Diagnosis Date  . Allergic rhinitis   . Anxiety   . Arthritis   . Colon polyps    adenomatous  . Corns and callosities   . Diverticulosis   . GERD (gastroesophageal reflux disease)   . Glaucoma   .  Hypercholesteremia    no meds  . Hypothyroidism   . Obesity   . Screening for diabetes mellitus   . Urge incontinence    Past Surgical History:  Procedure Laterality Date  . ABDOMINAL HYSTERECTOMY  1985   Dr Mancel Bale  . BUNIONECTOMY Right    Dr Jomarie Longs  . CARPAL TUNNEL RELEASE Bilateral 2010   Dr Laurance Flatten  . [place]   1 time  . COLONOSCOPY    . ROTATOR CUFF REPAIR Right    Dr Theda Sers  . THYROIDECTOMY  1987   Dr Guadlupe Spanish  . TONSILLECTOMY  1951   Family History  Problem Relation Age of Onset  . Leukemia Mother 50  . Lung cancer Father   . Diabetes Brother   . COPD Brother   . Lung cancer Brother   . Diabetes Maternal Grandmother   . Cancer Maternal Grandfather        unknown type  . Breast cancer Maternal Aunt    Social History   Socioeconomic History  . Marital status: Widowed    Spouse name: Not on file  . Number of children: 1  . Years of education: Not on file  . Highest education level: Not on file  Occupational History  . Occupation: retired  Scientific laboratory technician  . Financial resource strain: Not hard at all  .  Food insecurity:    Worry: Never true    Inability: Never true  . Transportation needs:    Medical: No    Non-medical: No  Tobacco Use  . Smoking status: Former Smoker    Types: Cigarettes    Last attempt to quit: 10/31/1986    Years since quitting: 32.0  . Smokeless tobacco: Never Used  Substance and Sexual Activity  . Alcohol use: No    Alcohol/week: 0.0 standard drinks  . Drug use: No  . Sexual activity: Never  Lifestyle  . Physical activity:    Days per week: 0 days    Minutes per session: 0 min  . Stress: Not at all  Relationships  . Social connections:    Talks on phone: More than three times a week    Gets together: More than three times a week    Attends religious service: 1 to 4 times per year    Active member of club or organization: No    Attends meetings of clubs or organizations: Never    Relationship status: Widowed   Other Topics Concern  . Not on file  Social History Narrative  . Not on file    Outpatient Encounter Medications as of 10/29/2018  Medication Sig  . acetaminophen (TYLENOL) 500 MG tablet Take 1,000 mg by mouth every 6 (six) hours as needed for mild pain.  . Ascorbic Acid (VITAMIN C PO) Take 1 tablet by mouth daily.  Marland Kitchen aspirin 81 MG tablet Take 81 mg by mouth daily.  Marland Kitchen CALCIUM-VITAMIN D PO Take 2 tablets by mouth daily.   . cephALEXin (KEFLEX) 500 MG capsule take 1 capsule by mouth once daily  . diclofenac sodium (VOLTAREN) 1 % GEL Apply 2 g topically 2 (two) times daily.  . hydroxypropyl methylcellulose / hypromellose (ISOPTO TEARS / GONIOVISC) 2.5 % ophthalmic solution Place 1 drop into both eyes 4 (four) times daily as needed for dry eyes.  Marland Kitchen levothyroxine (SYNTHROID, LEVOTHROID) 50 MCG tablet TAKE 1 TABLET BY MOUTH ONCE DAILY  . linaclotide (LINZESS) 145 MCG CAPS capsule Take 1 capsule (145 mcg total) by mouth daily before breakfast.  . Multiple Vitamin (MULTIVITAMIN WITH MINERALS) TABS Take 1 tablet by mouth daily.  Marland Kitchen omeprazole (PRILOSEC) 40 MG capsule Take 40 mg by mouth daily.  Marland Kitchen rOPINIRole (REQUIP) 0.25 MG tablet TAKE 1 TABLET BY MOUTH EVERY DAY AFTER SUPPER. TAKE 1 TO 2 HOURS BEFORE GOING TO BEDTIME  . tizanidine (ZANAFLEX) 2 MG capsule Take 1 capsule (2 mg total) by mouth 3 (three) times daily as needed for muscle spasms.  . traMADol (ULTRAM) 50 MG tablet TAKE 1 TABLET BY MOUTH EVERY 6 HOURS IF NEEDED   No facility-administered encounter medications on file as of 10/29/2018.     Activities of Daily Living In your present state of health, do you have any difficulty performing the following activities: 10/29/2018  Hearing? N  Vision? N  Difficulty concentrating or making decisions? N  Walking or climbing stairs? N  Dressing or bathing? N  Doing errands, shopping? N  Preparing Food and eating ? N  Using the Toilet? N  In the past six months, have you accidently leaked  urine? N  Do you have problems with loss of bowel control? N  Managing your Medications? N  Managing your Finances? N  Housekeeping or managing your Housekeeping? N  Some recent data might be hidden    Patient Care Team: [name], NP as PCP - General (Nurse Practitioner)  Assessment:   This is a routine wellness examination for [facility].  Exercise Activities and Dietary recommendations Current Exercise Habits: The patient does not participate in regular exercise at present, Exercise limited by: None identified  Goals    . Weight (lb) < 170 lb (77.1 kg)     Starting 10/20/16, I will attempt to decrease my weight to get to 170 lbs.       Fall Risk Fall Risk  10/29/2018 08/06/2018 11/14/2017 10/26/2017 05/12/2017  Falls in the past year? 0 No No No No   Is the patient's home free of loose throw rugs in walkways, pet beds, electrical cords, etc?   yes      Grab bars in the bathroom? no      Handrails on the stairs?   yes      Adequate lighting?   yes  Depression Screen PHQ 2/9 Scores 10/29/2018 08/06/2018 10/26/2017 10/20/2016  PHQ - 2 Score 0 0 0 0     Cognitive Function MMSE - Mini Mental State Exam 10/29/2018 10/26/2017 10/20/2016 10/22/2015 07/08/2014  Not completed: - - - (No Data) -  Orientation to time 5 4 5 5 5   Orientation to Place 5 5 5 5 5   Registration 3 3 3 3 3   Attention/ Calculation 5 5 5 5 5   Recall 2 2 3 3 2   Language- name 2 objects 2 2 2 2 2   Language- repeat 1 1 1 1 1   Language- follow 3 step command 3 3 3 2 3   Language- read & follow direction 1 1 1 1 1   Write a sentence 1 1 1 1 1   Copy design 1 1 1 1 1   Total score 29 28 30 29 29         Immunization History  Administered Date(s) Administered  . Influenza, High Dose Seasonal PF 08/26/2017, 08/06/2018  . Influenza,inj,Quad PF,6+ Mos 07/08/2014  . Influenza-Unspecified 08/09/2012, 08/01/2013, 06/30/2015, 08/04/2016, 07/31/2017  . Pneumococcal Conjugate-13 11/03/2014  . Pneumococcal  Polysaccharide-23 02/19/2013, 08/20/2017  . Td 10/31/2000  . Tdap 10/22/2015  . Varicella 06/15/2011  . Zoster 10/31/2010  . Zoster Recombinat (Shingrix) 03/21/2017, 06/04/2017    Qualifies for Shingles Vaccine? Up to date, completed  Screening Tests Health Maintenance  Topic Date Due  . MAMMOGRAM  01/18/2020  . COLONOSCOPY  02/16/2022  . TETANUS/TDAP  10/21/2025  . INFLUENZA VACCINE  Completed  . DEXA SCAN  Completed  . Hepatitis C Screening  Completed  . PNA vac Low Risk Adult  Completed    Cancer Screenings: Lung: Low Dose CT Chest recommended if Age 31-80 years, 30 pack-year currently smoking OR have quit w/in 15years. Patient does not qualify. Breast:  Up to date on Mammogram? Yes   Up to date of Bone Density/Dexa? Yes Colorectal: up to date  Additional Screenings:  Hepatitis C Screening: declined     Plan:    I have personally reviewed and addressed the Medicare Annual Wellness questionnaire and have noted the following in the patient's chart:  A. Medical and social history B. Use of alcohol, tobacco or illicit drugs  C. Current medications and supplements D. Functional ability and status E.  Nutritional status F.  Physical activity G. Advance directives H. List of other physicians I.  Hospitalizations, surgeries, and ER visits in previous 12 months J.  [place] to include hearing, vision, cognitive, depression L. Referrals and appointments - none  In addition, I have reviewed and discussed with patient certain preventive protocols, quality metrics,  and best practice recommendations. A written personalized care plan for preventive services as well as general preventive health recommendations were provided to patient.  See attached scanned questionnaire for additional information.   Signed,   Tyson Dense, RN Nurse Health Advisor  Patient concerns: None

## 2018-10-29 NOTE — Patient Instructions (Addendum)
Colleen [name] , Thank you for taking time to come for your Medicare Wellness Visit. I appreciate your ongoing commitment to your health goals. Please review the following plan we discussed and let me know if I can assist you in the future.   Screening recommendations/referrals: Colonoscopy up to date, scheduled for Friday Mammogram up to date, due 01/18/2019 Bone Density up to date, due with next mammogram. Ordered. Recommended yearly ophthalmology/optometry visit for glaucoma screening and checkup Recommended yearly dental visit for hygiene and checkup  Vaccinations: Influenza vaccine up to date Pneumococcal vaccine up to date, completed Tdap vaccine up to date, due 10/21/2025 Shingles vaccine up to date, completed    Advanced directives: Please bring [place] a copy of your living will and health care power of attorney  Conditions/risks identified: none  Next appointment: Dr. Mariea Clonts 02/07/2019 @ 10am            Medicare Wellness visit 11/04/2019 @ 8:30am   Preventive Care 74 Years and Older, Female Preventive care refers to lifestyle choices and visits with your health care provider that can promote health and wellness. What does preventive care include?  A yearly physical exam. This is also called an annual well check.  Dental exams once or twice a year.  Routine eye exams. Ask your health care provider how often you should have your eyes checked.  Personal lifestyle choices, including:  Daily care of your teeth and gums.  Regular physical activity.  Eating a healthy diet.  Avoiding tobacco and drug use.  Limiting alcohol use.  Practicing safe sex.  Taking low-dose aspirin every day.  Taking vitamin and mineral supplements as recommended by your health care provider. What happens during an annual well check? The services and screenings done by your health care provider during your annual well check will depend on your age, overall health, lifestyle risk factors, and family  history of disease. Counseling  Your health care provider may ask you questions about your:  Alcohol use.  Tobacco use.  Drug use.  Emotional well-being.  Home and relationship well-being.  Sexual activity.  Eating habits.  History of falls.  Memory and ability to understand (cognition).  Work and work Statistician.  Reproductive health. Screening  You may have the following tests or measurements:  Height, weight, and BMI.  Blood pressure.  Lipid and cholesterol levels. These may be checked every 5 years, or more frequently if you are over 74 years old.  Skin check.  Lung cancer screening. You may have this screening every year starting at age 74 if you have a 30-pack-year history of smoking and currently smoke or have quit within the past 15 years.  Fecal occult blood test (FOBT) of the stool. You may have this test every year starting at age 74.  Flexible sigmoidoscopy or colonoscopy. You may have a sigmoidoscopy every 5 years or a colonoscopy every 10 years starting at age 74.  Hepatitis C blood test.  Hepatitis B blood test.  Sexually transmitted disease (STD) testing.  Diabetes screening. This is done by checking your blood sugar (glucose) after you have not eaten for a while (fasting). You may have this done every 1-3 years.  Bone density scan. This is done to screen for osteoporosis. You may have this done starting at age 74.  Mammogram. This may be done every 1-2 years. Talk to your health care provider about how often you should have regular mammograms. Talk with your health care provider about your test results, treatment options,  and if necessary, the need for more tests. Vaccines  Your health care provider may recommend certain vaccines, such as:  Influenza vaccine. This is recommended every year.  Tetanus, diphtheria, and acellular pertussis (Tdap, Td) vaccine. You may need a Td booster every 10 years.  Zoster vaccine. You may need this after  age 74.  Pneumococcal 13-valent conjugate (PCV13) vaccine. One dose is recommended after age 74.  Pneumococcal polysaccharide (PPSV23) vaccine. One dose is recommended after age 74. Talk to your health care provider about which screenings and vaccines you need and how often you need them. This information is not intended to replace advice given to you by your health care provider. Make sure you discuss any questions you have with your health care provider. Document Released: 11/13/2015 Document Revised: 07/06/2016 Document Reviewed: 08/18/2015 Elsevier Interactive Patient Education  2017 [employer] Prevention in the Home Falls can cause injuries. They can happen to people of all ages. There are many things you can do to make your home safe and to help prevent falls. What can I do on the outside of my home?  Regularly fix the edges of walkways and driveways and fix any cracks.  Remove anything that might make you trip as you walk through a door, such as a raised step or threshold.  Trim any bushes or trees on the path to your home.  Use bright outdoor lighting.  Clear any walking paths of anything that might make someone trip, such as rocks or tools.  Regularly check to see if handrails are loose or broken. Make sure that both sides of any steps have handrails.  Any raised decks and porches should have guardrails on the edges.  Have any leaves, snow, or ice cleared regularly.  Use sand or salt on walking paths during winter.  Clean up any spills in your garage right away. This includes oil or grease spills. What can I do in the bathroom?  Use night lights.  Install grab bars by the toilet and in the tub and shower. Do not use towel bars as grab bars.  Use non-skid mats or decals in the tub or shower.  If you need to sit down in the shower, use a plastic, non-slip stool.  Keep the floor dry. Clean up any water that spills on the floor as soon as it  happens.  Remove soap buildup in the tub or shower regularly.  Attach bath mats securely with double-sided non-slip rug tape.  Do not have throw rugs and other things on the floor that can make you trip. What can I do in the bedroom?  Use night lights.  Make sure that you have a light by your bed that is easy to reach.  Do not use any sheets or blankets that are too big for your bed. They should not hang down onto the floor.  Have a firm chair that has side arms. You can use this for support while you get dressed.  Do not have throw rugs and other things on the floor that can make you trip. What can I do in the kitchen?  Clean up any spills right away.  Avoid walking on wet floors.  Keep items that you use a lot in easy-to-reach places.  If you need to reach something above you, use a strong step stool that has a grab bar.  Keep electrical cords out of the way.  Do not use floor polish or wax that makes floors slippery. If  you must use wax, use non-skid floor wax.  Do not have throw rugs and other things on the floor that can make you trip. What can I do with my stairs?  Do not leave any items on the stairs.  Make sure that there are handrails on both sides of the stairs and use them. Fix handrails that are broken or loose. Make sure that handrails are as long as the stairways.  Check any carpeting to make sure that it is firmly attached to the stairs. Fix any carpet that is loose or worn.  Avoid having throw rugs at the top or bottom of the stairs. If you do have throw rugs, attach them to the floor with carpet tape.  Make sure that you have a light switch at the top of the stairs and the bottom of the stairs. If you do not have them, ask someone to add them for you. What else can I do to help prevent falls?  Wear shoes that:  Do not have high heels.  Have rubber bottoms.  Are comfortable and fit you well.  Are closed at the toe. Do not wear sandals.  If you  use a stepladder:  Make sure that it is fully opened. Do not climb a closed stepladder.  Make sure that both sides of the stepladder are locked into place.  Ask someone to hold it for you, if possible.  Clearly mark and make sure that you can see:  Any grab bars or handrails.  First and last steps.  Where the edge of each step is.  Use tools that help you move around (mobility aids) if they are needed. These include:  Canes.  Walkers.  Scooters.  Crutches.  Turn on the lights when you go into a dark area. Replace any light bulbs as soon as they burn out.  Set up your furniture so you have a clear path. Avoid moving your furniture around.  If any of your floors are uneven, fix them.  If there are any pets around you, be aware of where they are.  Review your medicines with your doctor. Some medicines can make you feel dizzy. This can increase your chance of falling. Ask your doctor what other things that you can do to help prevent falls. This information is not intended to replace advice given to you by your health care provider. Make sure you discuss any questions you have with your health care provider. Document Released: 08/13/2009 Document Revised: 03/24/2016 Document Reviewed: 11/21/2014 Elsevier Interactive Patient Education  2017 [employer].

## 2018-11-01 ENCOUNTER — Encounter: Payer: Medicare HMO | Admitting: Internal Medicine

## 2018-11-02 ENCOUNTER — Encounter: Payer: Self-pay | Admitting: Internal Medicine

## 2018-11-02 ENCOUNTER — Ambulatory Visit (AMBULATORY_SURGERY_CENTER): Payer: Medicare HMO | Admitting: Internal Medicine

## 2018-11-02 VITALS — BP 124/58 | HR 57 | Temp 97.3°F | Resp 28 | Ht 62.0 in | Wt 180.0 lb

## 2018-11-02 DIAGNOSIS — D122 Benign neoplasm of ascending colon: Secondary | ICD-10-CM | POA: Diagnosis not present

## 2018-11-02 DIAGNOSIS — K5909 Other constipation: Secondary | ICD-10-CM

## 2018-11-02 DIAGNOSIS — Z86010 Personal history of colonic polyps: Secondary | ICD-10-CM | POA: Diagnosis not present

## 2018-11-02 DIAGNOSIS — R1030 Lower abdominal pain, unspecified: Secondary | ICD-10-CM | POA: Diagnosis not present

## 2018-11-02 DIAGNOSIS — R1032 Left lower quadrant pain: Secondary | ICD-10-CM

## 2018-11-02 DIAGNOSIS — K219 Gastro-esophageal reflux disease without esophagitis: Secondary | ICD-10-CM | POA: Diagnosis not present

## 2018-11-02 MED ORDER — SODIUM CHLORIDE 0.9 % IV SOLN: 500.0000 mL | Freq: Once | INTRAVENOUS | Status: DC

## 2018-11-02 NOTE — Patient Instructions (Signed)
Discharge instructions given. Handouts on polyps,diverticulosis and hemorrhoids. Resume previous medications. YOU HAD AN ENDOSCOPIC PROCEDURE TODAY AT THE [facility]:   Refer to the procedure report that was given to you for any specific questions about what was found during the examination.  If the procedure report does not answer your questions, please call your gastroenterologist to clarify.  If you requested that your care partner not be given the details of your procedure findings, then the procedure report has been included in a sealed envelope for you to review at your convenience later.  YOU SHOULD EXPECT: Some feelings of bloating in the abdomen. Passage of more gas than usual.  Walking can help get rid of the air that was put into your GI tract during the procedure and reduce the bloating. If you had a lower endoscopy (such as a colonoscopy or flexible sigmoidoscopy) you may notice spotting of blood in your stool or on the toilet paper. If you underwent a bowel prep for your procedure, you may not have a normal bowel movement for a few days.  Please Note:  You might notice some irritation and congestion in your nose or some drainage.  This is from the oxygen used during your procedure.  There is no need for concern and it should clear up in a day or so.  SYMPTOMS TO REPORT IMMEDIATELY:   Following lower endoscopy (colonoscopy or flexible sigmoidoscopy):  Excessive amounts of blood in the stool  Significant tenderness or worsening of abdominal pains  Swelling of the abdomen that is new, acute  Fever of 100F or higher   For urgent or emergent issues, a gastroenterologist can be reached at any hour by calling [phone].   DIET:  We do recommend a small meal at first, but then you may proceed to your regular diet.  Drink plenty of fluids but you should avoid alcoholic beverages for 24 hours.  ACTIVITY:  You should plan to take it easy for the rest of today and you  should NOT DRIVE or use heavy machinery until tomorrow (because of the sedation medicines used during the test).    FOLLOW UP: Our staff will call the number listed on your records the next business day following your procedure to check on you and address any questions or concerns that you may have regarding the information given to you following your procedure. If we do not reach you, we will leave a message.  However, if you are feeling well and you are not experiencing any problems, there is no need to return our call.  We will assume that you have returned to your regular daily activities without incident.  If any biopsies were taken you will be contacted by phone or by letter within the next 1-3 weeks.  Please call us at [phone] if you have not heard about the biopsies in 3 weeks.    SIGNATURES/CONFIDENTIALITY: You and/or your care partner have signed paperwork which will be entered into your electronic medical record.  These signatures attest to the fact that that the information above on your After Visit Summary has been reviewed and is understood.  Full responsibility of the confidentiality of this discharge information lies with you and/or your care-partner. 

## 2018-11-02 NOTE — Progress Notes (Signed)
Pt's states no medical or surgical changes since previsit or office visit. 

## 2018-11-02 NOTE — Progress Notes (Signed)
Called to room to assist during endoscopic procedure.  Patient ID and intended procedure confirmed with present staff. Received instructions for my participation in the procedure from the performing physician.  

## 2018-11-02 NOTE — Op Note (Signed)
[name] Procedure Date: 11/02/2018 2:27 PM MRN: [id number] Endoscopist: Docia Chuck. Colleen [name] , MD Age: 73 Referring MD:  Date of Birth: October 18, 1946 Gender: Female Account #: [id number] Procedure:                Colonoscopy with cold snare polypectomy x 2 Indications:              High risk colon cancer surveillance: Personal                            history of non-advanced adenoma, High risk colon                            cancer surveillance: Personal history of sessile                            serrated colon polyp (less than 10 mm in size) with                            no dysplasia previous examination [facility] April 2013. Recent office evaluation for                            constipation and left lower quadrant pain secondary                            to the same. Negative CT. Has responded to [facility] Medicines:                Monitored Anesthesia Care Procedure:                Pre-Anesthesia Assessment:                           - Prior to the procedure, a History and Physical                            was performed, and patient medications and                            allergies were reviewed. The patient's tolerance of                            previous anesthesia was also reviewed. The risks                            and benefits of the procedure and the sedation                            options and risks were discussed with the patient.                            All questions were answered, and informed consent  was obtained. Prior Anticoagulants: The patient has                            taken no previous anticoagulant or antiplatelet                            agents. ASA Grade Assessment: II - A patient with                            mild systemic disease. After reviewing the risks                            and benefits, the patient was deemed in                       satisfactory condition to undergo the procedure.                           After obtaining informed consent, the colonoscope                            was passed under direct vision. Throughout the                            procedure, the patient's blood pressure, pulse, and                            oxygen saturations were monitored continuously. The                            Colonoscope was introduced through the anus and                            advanced to the the cecum, identified by                            appendiceal orifice and ileocecal valve. The                            ileocecal valve, appendiceal orifice, and rectum                            were photographed. The quality of the bowel                            preparation was good. The colonoscopy was performed                            without difficulty. The patient tolerated the                            procedure well. The bowel preparation used was  SUPREP. Scope In: 2:33:10 PM Scope Out: 3:89:37 PM Scope Withdrawal Time: 0 hours 11 minutes 44 seconds  Total Procedure Duration: 0 hours 15 minutes 58 seconds  Findings:                 Two polyps were found in the ascending colon. The                            polyps were 2 to 3 mm in size. These polyps were                            removed with a cold snare. Resection and retrieval                            were complete.                           Multiple diverticula were found in the sigmoid                            colon.                           Internal hemorrhoids were found during retroflexion.                           The exam was otherwise without abnormality on                            direct and retroflexion views. Complications:            No immediate complications. Estimated blood loss:                            None. Estimated Blood Loss:     Estimated blood loss: none. Impression:                - Two 2 to 3 mm polyps in the ascending colon,                            removed with a cold snare. Resected and retrieved.                           - Diverticulosis in the sigmoid colon.                           - Internal hemorrhoids.                           - The examination was otherwise normal on direct                            and retroflexion views. Recommendation:           - Repeat colonoscopy is not recommended for  surveillance.                           - Patient has a contact number available for                            emergencies. The signs and symptoms of potential                            delayed complications were discussed with the                            patient. Return to normal activities tomorrow.                            Written discharge instructions were provided to the                            patient.                           - Resume previous diet.                           - Continue present medications.                           - Await pathology results. Docia Chuck. Colleen Pastor, MD 11/02/2018 2:54:00 PM This report has been signed electronically.

## 2018-11-02 NOTE — Progress Notes (Signed)
Report to PACU, RN, vss, BBS= Clear.  

## 2018-11-05 ENCOUNTER — Telehealth: Payer: Self-pay

## 2018-11-05 NOTE — Telephone Encounter (Signed)
  Follow up Call-  Call back number 11/02/2018  Post procedure Call Back phone  # [phone]  Permission to leave phone message Yes  Some recent data might be hidden     Patient questions:  Do you have a fever, pain , or abdominal swelling? No. Pain Score  0 *  Have you tolerated food without any problems? Yes.    Have you been able to return to your normal activities? Yes.    Do you have any questions about your discharge instructions: Diet   No. Medications  No. Follow up visit  No.  Do you have questions or concerns about your Care? No.  Actions: * If pain score is 4 or above: No action needed, pain <4.

## 2018-11-08 ENCOUNTER — Encounter: Payer: Self-pay | Admitting: Internal Medicine

## 2018-11-23 ENCOUNTER — Other Ambulatory Visit: Payer: Self-pay | Admitting: Nurse Practitioner

## 2018-11-23 DIAGNOSIS — E039 Hypothyroidism, unspecified: Secondary | ICD-10-CM

## 2018-12-19 ENCOUNTER — Other Ambulatory Visit: Payer: Self-pay | Admitting: Internal Medicine

## 2018-12-19 DIAGNOSIS — G8929 Other chronic pain: Secondary | ICD-10-CM

## 2018-12-19 DIAGNOSIS — M545 Low back pain: Principal | ICD-10-CM

## 2018-12-20 NOTE — Telephone Encounter (Signed)
[place] Database verified and compliance confirmed   Last filled 08/09/2018  Last OV 08/06/18  Pending OV 02/28/2019 with Dr.Reed

## 2018-12-21 ENCOUNTER — Other Ambulatory Visit: Payer: Self-pay | Admitting: Gastroenterology

## 2018-12-24 ENCOUNTER — Encounter: Payer: Self-pay | Admitting: Family Medicine

## 2018-12-24 ENCOUNTER — Ambulatory Visit: Payer: Medicare HMO | Admitting: Family Medicine

## 2018-12-24 DIAGNOSIS — S134XXD Sprain of ligaments of cervical spine, subsequent encounter: Secondary | ICD-10-CM | POA: Diagnosis not present

## 2018-12-24 MED ORDER — PREDNISONE 20 MG PO TABS: 40.0000 mg | ORAL_TABLET | Freq: Every day | ORAL | 0 refills | Status: AC

## 2018-12-24 NOTE — Patient Instructions (Addendum)
Good to see you  Ice is your friend Ice 20 minutes 2 times daily. Usually after activity and before bed. Prednisone daily for 5 days  Exercises 3 times a week.  Keep hands within peripheral vision Not unusual to have a flare or 2 within 1st year and the underlying arthritis.   My feeling would be a flare for 2-3 days every 6 months is the most  Call medical records [phone] See me again in 2 weeks
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
## 2018-12-24 NOTE — Assessment & Plan Note (Signed)
Patient seems to be having exacerbation of more the muscle aches.  We discussed going back to the same exercises, icing regimen, and routines.  We discussed the possibility of advanced imaging which patient declined.  Patient given prednisone and warned potential side effects.  Discussed gabapentin.  Follow-up again in 2 weeks.  Worsening symptoms would have to encourage patient to get the advanced imaging significantly.

## 2018-12-29 ENCOUNTER — Ambulatory Visit (INDEPENDENT_AMBULATORY_CARE_PROVIDER_SITE_OTHER): Payer: Medicare HMO

## 2018-12-29 ENCOUNTER — Ambulatory Visit
Admission: EM | Admit: 2018-12-29 | Discharge: 2018-12-29 | Disposition: A | Discharge status: Home / Self Care | Payer: Medicare HMO | Attending: Physician Assistant | Admitting: Physician Assistant

## 2018-12-29 ENCOUNTER — Other Ambulatory Visit: Payer: Self-pay

## 2018-12-29 DIAGNOSIS — J22 Unspecified acute lower respiratory infection: Secondary | ICD-10-CM

## 2018-12-29 DIAGNOSIS — R05 Cough: Secondary | ICD-10-CM | POA: Diagnosis not present

## 2018-12-29 DIAGNOSIS — R197 Diarrhea, unspecified: Secondary | ICD-10-CM

## 2018-12-29 DIAGNOSIS — R030 Elevated blood-pressure reading, without diagnosis of hypertension: Secondary | ICD-10-CM | POA: Diagnosis not present

## 2018-12-29 DIAGNOSIS — R059 Cough, unspecified: Secondary | ICD-10-CM

## 2018-12-29 MED ORDER — GUAIFENESIN-CODEINE 100-10 MG/5ML PO SYRP: 5.0000 mL | ORAL_SOLUTION | Freq: Three times a day (TID) | ORAL | 0 refills | Status: DC | PRN

## 2018-12-29 MED ORDER — DOXYCYCLINE HYCLATE 100 MG PO CAPS: 100.0000 mg | ORAL_CAPSULE | Freq: Two times a day (BID) | ORAL | 0 refills | Status: AC

## 2018-12-29 MED ORDER — ONDANSETRON HCL 4 MG PO TABS: 4.0000 mg | ORAL_TABLET | Freq: Four times a day (QID) | ORAL | 0 refills | Status: DC

## 2018-12-29 NOTE — Discharge Instructions (Addendum)
Please pick up some Imodium right ear at the pharmacy.  Take 2 tabs once and then 1 tab every time you have diarrhea.  You can take a maximum of 4 tabs a day.  Please continue taking Tylenol while taking antibiotics.  I would avoid NSAIDs like ibuprofen, Aleve, high-dose aspirin.  If you take low-dose aspirin is okay to continue that.  Please go back to your primary care provider to have your blood pressure rechecked.

## 2018-12-29 NOTE — ED Triage Notes (Signed)
Per pt she has been having cough and congestion in her chest for about 2 days with diarrhea. Pt has been around grandson with pneumonia this week. Discomfort in her chest when she cough with appetite. Pt is having more weakness than normal. No fevers, no chest pain no SOB

## 2018-12-29 NOTE — ED Provider Notes (Addendum)
12/29/2018 11:12 AM   DOB: Apr 04, 1946 / MRN: [id number]  SUBJECTIVE:  Colleen [name] is a 73 y.o. female presenting for worsening productive cough that started about 5 days ago.  She denies chest pain and shortness of breath.  Associates diarrhea that started 2 days ago.  Having some dizziness this morning in the shower but better since getting out and about.  She has No Known Allergies.   She  has a past medical history of Allergic rhinitis, Anxiety, Arthritis, Colon polyps, Corns and callosities, Diverticulosis, GERD (gastroesophageal reflux disease), Glaucoma, Hypercholesteremia, Hypothyroidism, Obesity, Screening for diabetes mellitus, and Urge incontinence.    She  reports that she quit smoking about 32 years ago. Her smoking use included cigarettes. She has never used smokeless tobacco. She reports that she does not drink alcohol or use drugs. She  reports never being sexually active. The patient  has a past surgical history that includes Abdominal hysterectomy (1985); Tonsillectomy (1951); Thyroidectomy (1987); Carpal tunnel release (Bilateral, 2010); Bunionectomy (Right); Rotator cuff repair (Right); Cesarean section (1983); and Colonoscopy.  Her family history includes Breast cancer in her maternal aunt; COPD in her brother; Cancer in her maternal grandfather; Diabetes in her brother and maternal grandmother; Leukemia (age of onset: 34) in her mother; Lung cancer in her brother and father.  Review of Systems  HENT: Negative for sore throat.   Respiratory: Positive for cough. Negative for hemoptysis, sputum production, shortness of breath and wheezing.   Cardiovascular: Negative for chest pain.  Gastrointestinal: Negative for nausea.  Musculoskeletal: Positive for myalgias.  Skin: Negative for rash.    OBJECTIVE:  BP (!) 153/80 (BP Location: Left Arm)   Pulse 77   Temp 98.7 F (37.1 C) (Oral)   Resp 16   SpO2 96%   Wt Readings from Last 3 Encounters:  12/24/18 176 lb (79.8 kg)   11/02/18 180 lb (81.6 kg)  10/29/18 179 lb (81.2 kg)   Temp Readings from Last 3 Encounters:  12/29/18 98.7 F (37.1 C) (Oral)  11/02/18 (!) 97.3 F (36.3 C) (Temporal)  10/29/18 97.9 F (36.6 C) (Oral)   BP Readings from Last 3 Encounters:  12/29/18 (!) 153/80  12/24/18 126/70  11/02/18 (!) 124/58   Pulse Readings from Last 3 Encounters:  12/29/18 77  12/24/18 (!) 52  11/02/18 (!) 57    Physical Exam Vitals signs reviewed.  Constitutional:      General: She is not in acute distress.    Appearance: She is ill-appearing. She is not toxic-appearing or diaphoretic.  Eyes:     Pupils: Pupils are equal, round, and reactive to light.  Cardiovascular:     Rate and Rhythm: Normal rate and regular rhythm.     Pulses: Normal pulses.     Heart sounds: Normal heart sounds.  Pulmonary:     Effort: Pulmonary effort is normal.     Breath sounds: Wheezing (Faint wheeze in the right lower airspace.) present.  Abdominal:     General: There is no distension.  Musculoskeletal: Normal range of motion.  Skin:    General: Skin is warm and dry.  Neurological:     General: No focal deficit present.     Mental Status: She is alert and oriented to person, place, and time.     Cranial Nerves: No cranial nerve deficit.     Gait: Gait normal.    Lab Results  Component Value Date   CREATININE 0.98 09/14/2018   BUN 17 09/14/2018   NA 143 09/14/2018  K 4.4 09/14/2018   CL 108 09/14/2018   CO2 30 09/14/2018     No results found for this or any previous visit (from the past 72 hour(s)).  No results found.  ASSESSMENT AND PLAN:   LRTI (lower respiratory tract infection)  Cough  Diarrhea, unspecified type  Elevated blood pressure reading    Discharge Instructions     Please pick up some Imodium right ear at the pharmacy.  Take 2 tabs once and then 1 tab every time you have diarrhea.  You can take a maximum of 4 tabs a day.  Please continue taking Tylenol while taking  antibiotics.  I would avoid NSAIDs like ibuprofen, Aleve, high-dose aspirin.  If you take low-dose aspirin is okay to continue that.  Please go back to your primary care provider to have your blood pressure rechecked.        The patient is advised to call or return to clinic if she does not see an improvement in symptoms, or to seek the care of the closest emergency department if she worsens with the above plan.   Philis Fendt, MHS, PA-C 12/29/2018 11:12 AM  Tereasa Coop, PA-C 12/29/18 1055  Tereasa Coop, PA-C 12/29/18 1112

## 2019-01-02 DIAGNOSIS — M7542 Impingement syndrome of left shoulder: Secondary | ICD-10-CM | POA: Diagnosis not present

## 2019-01-02 DIAGNOSIS — M25512 Pain in left shoulder: Secondary | ICD-10-CM | POA: Diagnosis not present

## 2019-01-22 ENCOUNTER — Ambulatory Visit: Payer: Medicare HMO

## 2019-01-22 ENCOUNTER — Other Ambulatory Visit: Payer: Medicare HMO

## 2019-01-23 ENCOUNTER — Ambulatory Visit: Payer: Medicare HMO | Admitting: Family Medicine

## 2019-01-25 ENCOUNTER — Other Ambulatory Visit: Payer: Medicare HMO

## 2019-01-25 ENCOUNTER — Ambulatory Visit: Payer: Medicare HMO

## 2019-02-07 ENCOUNTER — Ambulatory Visit: Payer: Self-pay | Admitting: Internal Medicine

## 2019-02-07 ENCOUNTER — Other Ambulatory Visit: Payer: Self-pay

## 2019-02-25 ENCOUNTER — Other Ambulatory Visit: Payer: Self-pay | Admitting: Nurse Practitioner

## 2019-02-26 ENCOUNTER — Other Ambulatory Visit: Payer: Self-pay

## 2019-02-26 ENCOUNTER — Other Ambulatory Visit: Payer: Medicare HMO

## 2019-02-26 DIAGNOSIS — E785 Hyperlipidemia, unspecified: Secondary | ICD-10-CM

## 2019-02-26 LAB — COMPLETE METABOLIC PANEL WITH GFR
AG Ratio: 1.8 (calc) (ref 1.0–2.5)
ALT: 13 U/L (ref 6–29)
AST: 26 U/L (ref 10–35)
Albumin: 4.2 g/dL (ref 3.6–5.1)
Alkaline phosphatase (APISO): 91 U/L (ref 37–153)
BUN: 18 mg/dL (ref 7–25)
CO2: 29 mmol/L (ref 20–32)
Calcium: 9.8 mg/dL (ref 8.6–10.4)
Chloride: 109 mmol/L (ref 98–110)
Creat: 0.82 mg/dL (ref 0.60–0.93)
GFR, Est African American: 82 mL/min/{1.73_m2} (ref >=60)
GFR, Est Non African American: 70 mL/min/{1.73_m2} (ref >=60)
Globulin: 2.4 g/dL (calc) (ref 1.9–3.7)
Glucose, Bld: 101 mg/dL — ABNORMAL HIGH (ref 65–99)
Potassium: 3.9 mmol/L (ref 3.5–5.3)
Sodium: 145 mmol/L (ref 135–146)
Total Bilirubin: 0.4 mg/dL (ref 0.2–1.2)
Total Protein: 6.6 g/dL (ref 6.1–8.1)

## 2019-02-26 LAB — LIPID PANEL
Cholesterol: 200 mg/dL — ABNORMAL HIGH (ref <200)
HDL: 75 mg/dL (ref >=50)
LDL Cholesterol (Calc): 111 mg/dL (calc) — ABNORMAL HIGH
Non-HDL Cholesterol (Calc): 125 mg/dL (calc) (ref <130)
Total CHOL/HDL Ratio: 2.7 (calc) (ref <5.0)
Triglycerides: 57 mg/dL (ref <150)

## 2019-02-28 ENCOUNTER — Other Ambulatory Visit: Payer: Self-pay

## 2019-02-28 ENCOUNTER — Other Ambulatory Visit: Payer: Self-pay | Admitting: Nurse Practitioner

## 2019-02-28 ENCOUNTER — Ambulatory Visit (INDEPENDENT_AMBULATORY_CARE_PROVIDER_SITE_OTHER): Payer: Medicare HMO | Admitting: Nurse Practitioner

## 2019-02-28 ENCOUNTER — Encounter: Payer: Self-pay | Admitting: Nurse Practitioner

## 2019-02-28 DIAGNOSIS — G8929 Other chronic pain: Secondary | ICD-10-CM

## 2019-02-28 DIAGNOSIS — M545 Low back pain, unspecified: Secondary | ICD-10-CM

## 2019-02-28 DIAGNOSIS — E038 Other specified hypothyroidism: Secondary | ICD-10-CM | POA: Diagnosis not present

## 2019-02-28 DIAGNOSIS — E785 Hyperlipidemia, unspecified: Secondary | ICD-10-CM

## 2019-02-28 DIAGNOSIS — M8580 Other specified disorders of bone density and structure, unspecified site: Secondary | ICD-10-CM | POA: Diagnosis not present

## 2019-02-28 DIAGNOSIS — M542 Cervicalgia: Secondary | ICD-10-CM

## 2019-02-28 DIAGNOSIS — R519 Headache, unspecified: Secondary | ICD-10-CM

## 2019-02-28 DIAGNOSIS — R12 Heartburn: Secondary | ICD-10-CM | POA: Diagnosis not present

## 2019-02-28 DIAGNOSIS — E039 Hypothyroidism, unspecified: Secondary | ICD-10-CM

## 2019-02-28 DIAGNOSIS — S134XXD Sprain of ligaments of cervical spine, subsequent encounter: Secondary | ICD-10-CM | POA: Diagnosis not present

## 2019-02-28 DIAGNOSIS — R51 Headache: Secondary | ICD-10-CM | POA: Diagnosis not present

## 2019-02-28 DIAGNOSIS — K5909 Other constipation: Secondary | ICD-10-CM | POA: Diagnosis not present

## 2019-02-28 DIAGNOSIS — G2581 Restless legs syndrome: Secondary | ICD-10-CM

## 2019-02-28 MED ORDER — MELOXICAM 7.5 MG PO TABS: 7.5000 mg | ORAL_TABLET | Freq: Every day | ORAL | 1 refills | Status: DC

## 2019-02-28 MED ORDER — TRAMADOL HCL 50 MG PO TABS: ORAL_TABLET | ORAL | 0 refills | Status: DC

## 2019-02-28 MED ORDER — TIZANIDINE HCL 2 MG PO CAPS: 2.0000 mg | ORAL_CAPSULE | Freq: Three times a day (TID) | ORAL | 2 refills | Status: DC | PRN

## 2019-02-28 MED ORDER — DICLOFENAC SODIUM 1 % TD GEL: 2.0000 g | Freq: Two times a day (BID) | TRANSDERMAL | 3 refills | Status: DC
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
## 2019-02-28 NOTE — Patient Instructions (Signed)
To use mobic 7.5 mg daily as needed for exacerbations.  To limit to 5 days when using To use zanaflex 2-3 times daily when having exacerbations as well to help muscles in neck and pain To use Voltaren gel and tramadol as needed pain.

## 2019-03-05 ENCOUNTER — Telehealth: Payer: Self-pay | Admitting: *Deleted

## 2019-03-05 NOTE — Telephone Encounter (Signed)
Received fax from [facility] for prior authorization for Diclofenac Sodium Gel 1%. Filled out and placed in [place] folder for review and to sign. To be faxed back to [facility] Fax: [phone]

## 2019-03-07 NOTE — Telephone Encounter (Signed)
Received fax from [facility] stating that medication was already APPROVED from 10/29/2018-10/31/2019 Member ID: [facility]

## 2019-03-15 ENCOUNTER — Other Ambulatory Visit: Payer: Self-pay | Admitting: *Deleted

## 2019-03-15 MED ORDER — OMEPRAZOLE 40 MG PO CPDR: 40.0000 mg | DELAYED_RELEASE_CAPSULE | Freq: Every day | ORAL | 0 refills | Status: DC

## 2019-03-15 NOTE — Telephone Encounter (Signed)
Patient requested rx

## 2019-04-11 ENCOUNTER — Other Ambulatory Visit: Payer: Self-pay | Admitting: Internal Medicine

## 2019-05-02 ENCOUNTER — Ambulatory Visit
Admission: RE | Admit: 2019-05-02 | Discharge: 2019-05-02 | Disposition: A | Discharge status: Home / Self Care | Payer: Medicare HMO | Source: Ambulatory Visit | Attending: Nurse Practitioner | Admitting: Nurse Practitioner

## 2019-05-02 ENCOUNTER — Other Ambulatory Visit: Payer: Self-pay

## 2019-05-02 DIAGNOSIS — Z1231 Encounter for screening mammogram for malignant neoplasm of breast: Secondary | ICD-10-CM | POA: Diagnosis not present

## 2019-05-27 ENCOUNTER — Other Ambulatory Visit: Payer: Self-pay | Admitting: Nurse Practitioner

## 2019-05-27 DIAGNOSIS — E039 Hypothyroidism, unspecified: Secondary | ICD-10-CM

## 2019-06-04 ENCOUNTER — Other Ambulatory Visit: Payer: Self-pay

## 2019-06-04 ENCOUNTER — Telehealth: Payer: Self-pay

## 2019-06-04 DIAGNOSIS — K862 Cyst of pancreas: Secondary | ICD-10-CM

## 2019-06-04 NOTE — Telephone Encounter (Signed)
Pt scheduled for MR of abd at [facility] 06/13/19@9am , pt to arrive there at 8:30am and be NPO after midnight. Pt aware of appt.

## 2019-06-04 NOTE — Telephone Encounter (Signed)
-----   Message from Algernon Huxley, RN sent at 02/15/2019  2:24 PM EDT -----  ----- Message ----- From: Algernon Huxley, RN Sent: 02/04/2019 To: Algernon Huxley, RN  Pt needs repeat MRI in 1 year

## 2019-06-06 ENCOUNTER — Ambulatory Visit
Admission: RE | Admit: 2019-06-06 | Discharge: 2019-06-06 | Disposition: A | Discharge status: Home / Self Care | Payer: Medicare HMO | Source: Ambulatory Visit | Attending: Nurse Practitioner | Admitting: Nurse Practitioner

## 2019-06-06 ENCOUNTER — Other Ambulatory Visit: Payer: Self-pay

## 2019-06-06 DIAGNOSIS — Z780 Asymptomatic menopausal state: Secondary | ICD-10-CM | POA: Diagnosis not present

## 2019-06-06 DIAGNOSIS — E2839 Other primary ovarian failure: Secondary | ICD-10-CM

## 2019-06-06 DIAGNOSIS — M8589 Other specified disorders of bone density and structure, multiple sites: Secondary | ICD-10-CM | POA: Diagnosis not present

## 2019-06-07 ENCOUNTER — Other Ambulatory Visit: Payer: Self-pay | Admitting: Nurse Practitioner

## 2019-06-13 ENCOUNTER — Ambulatory Visit (HOSPITAL_COMMUNITY)
Admission: RE | Admit: 2019-06-13 | Discharge: 2019-06-13 | Disposition: A | Discharge status: Home / Self Care | Payer: Medicare HMO | Source: Ambulatory Visit | Attending: Internal Medicine | Admitting: Internal Medicine

## 2019-06-13 ENCOUNTER — Other Ambulatory Visit: Payer: Self-pay

## 2019-06-13 ENCOUNTER — Other Ambulatory Visit: Payer: Self-pay | Admitting: Internal Medicine

## 2019-06-13 DIAGNOSIS — K862 Cyst of pancreas: Secondary | ICD-10-CM | POA: Insufficient documentation

## 2019-06-13 DIAGNOSIS — K8689 Other specified diseases of pancreas: Secondary | ICD-10-CM | POA: Diagnosis not present

## 2019-06-13 LAB — POCT I-STAT CREATININE: Creatinine, Ser: 0.9 mg/dL (ref 0.44–1.00)

## 2019-06-13 MED ORDER — GADOBUTROL 1 MMOL/ML IV SOLN
7.5000 mL | Freq: Once | INTRAVENOUS | Status: AC | PRN
  Administered 2019-06-13: 7.5 mL via INTRAVENOUS

## 2019-06-25 DIAGNOSIS — N952 Postmenopausal atrophic vaginitis: Secondary | ICD-10-CM | POA: Insufficient documentation

## 2019-06-25 DIAGNOSIS — R350 Frequency of micturition: Secondary | ICD-10-CM | POA: Diagnosis not present

## 2019-06-25 DIAGNOSIS — N390 Urinary tract infection, site not specified: Secondary | ICD-10-CM | POA: Insufficient documentation

## 2019-06-25 HISTORY — DX: Urinary tract infection, site not specified: N39.0

## 2019-06-25 HISTORY — DX: Postmenopausal atrophic vaginitis: N95.2

## 2019-07-14 ENCOUNTER — Other Ambulatory Visit: Payer: Self-pay | Admitting: Nurse Practitioner

## 2019-07-14 DIAGNOSIS — S134XXD Sprain of ligaments of cervical spine, subsequent encounter: Secondary | ICD-10-CM

## 2019-07-14 DIAGNOSIS — M542 Cervicalgia: Secondary | ICD-10-CM

## 2019-07-28 DIAGNOSIS — R69 Illness, unspecified: Secondary | ICD-10-CM | POA: Diagnosis not present

## 2019-08-06 ENCOUNTER — Other Ambulatory Visit: Payer: Self-pay | Admitting: Internal Medicine

## 2019-08-22 ENCOUNTER — Other Ambulatory Visit: Payer: Self-pay | Admitting: Nurse Practitioner

## 2019-08-22 DIAGNOSIS — E039 Hypothyroidism, unspecified: Secondary | ICD-10-CM

## 2019-08-27 ENCOUNTER — Ambulatory Visit: Payer: Medicare HMO | Admitting: Nurse Practitioner

## 2019-08-28 ENCOUNTER — Ambulatory Visit (INDEPENDENT_AMBULATORY_CARE_PROVIDER_SITE_OTHER): Payer: Medicare HMO | Admitting: Nurse Practitioner

## 2019-08-28 ENCOUNTER — Encounter: Payer: Self-pay | Admitting: Nurse Practitioner

## 2019-08-28 ENCOUNTER — Other Ambulatory Visit: Payer: Self-pay

## 2019-08-28 VITALS — BP 134/78 | HR 60 | Temp 97.1°F | Ht 61.0 in | Wt 169.0 lb

## 2019-08-28 DIAGNOSIS — K5901 Slow transit constipation: Secondary | ICD-10-CM | POA: Diagnosis not present

## 2019-08-28 DIAGNOSIS — Z7189 Other specified counseling: Secondary | ICD-10-CM

## 2019-08-28 DIAGNOSIS — G2581 Restless legs syndrome: Secondary | ICD-10-CM

## 2019-08-28 DIAGNOSIS — N390 Urinary tract infection, site not specified: Secondary | ICD-10-CM

## 2019-08-28 DIAGNOSIS — M8580 Other specified disorders of bone density and structure, unspecified site: Secondary | ICD-10-CM

## 2019-08-28 DIAGNOSIS — M8949 Other hypertrophic osteoarthropathy, multiple sites: Secondary | ICD-10-CM | POA: Diagnosis not present

## 2019-08-28 DIAGNOSIS — E785 Hyperlipidemia, unspecified: Secondary | ICD-10-CM

## 2019-08-28 DIAGNOSIS — R12 Heartburn: Secondary | ICD-10-CM

## 2019-08-28 DIAGNOSIS — E038 Other specified hypothyroidism: Secondary | ICD-10-CM

## 2019-08-28 DIAGNOSIS — M159 Polyosteoarthritis, unspecified: Secondary | ICD-10-CM
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
## 2019-08-29 LAB — CBC WITH DIFFERENTIAL/PLATELET
Absolute Monocytes: 237 cells/uL (ref 200–950)
Basophils Absolute: 22 cells/uL (ref 0–200)
Basophils Relative: 0.4 %
Eosinophils Absolute: 22 cells/uL (ref 15–500)
Eosinophils Relative: 0.4 %
HCT: 39.7 % (ref 35.0–45.0)
Hemoglobin: 12.7 g/dL (ref 11.7–15.5)
Lymphs Abs: 2299 cells/uL (ref 850–3900)
MCH: 30.7 pg (ref 27.0–33.0)
MCHC: 32 g/dL (ref 32.0–36.0)
MCV: 95.9 fL (ref 80.0–100.0)
MPV: 12.3 fL (ref 7.5–12.5)
Monocytes Relative: 4.3 %
Neutro Abs: 2921 cells/uL (ref 1500–7800)
Neutrophils Relative %: 53.1 %
Platelets: 252 10*3/uL (ref 140–400)
RBC: 4.14 10*6/uL (ref 3.80–5.10)
RDW: 11.6 % (ref 11.0–15.0)
Total Lymphocyte: 41.8 %
WBC: 5.5 10*3/uL (ref 3.8–10.8)

## 2019-08-29 LAB — LIPID PANEL
Cholesterol: 262 mg/dL — ABNORMAL HIGH (ref <200)
HDL: 95 mg/dL (ref >=50)
LDL Cholesterol (Calc): 150 mg/dL (calc) — ABNORMAL HIGH
Non-HDL Cholesterol (Calc): 167 mg/dL (calc) — ABNORMAL HIGH (ref <130)
Total CHOL/HDL Ratio: 2.8 (calc) (ref <5.0)
Triglycerides: 73 mg/dL (ref <150)

## 2019-08-29 LAB — BASIC METABOLIC PANEL WITH GFR
BUN/Creatinine Ratio: 21 (calc) (ref 6–22)
BUN: 20 mg/dL (ref 7–25)
CO2: 28 mmol/L (ref 20–32)
Calcium: 10.1 mg/dL (ref 8.6–10.4)
Chloride: 106 mmol/L (ref 98–110)
Creat: 0.96 mg/dL — ABNORMAL HIGH (ref 0.60–0.93)
GFR, Est African American: 67 mL/min/{1.73_m2} (ref >=60)
GFR, Est Non African American: 58 mL/min/{1.73_m2} — ABNORMAL LOW (ref >=60)
Glucose, Bld: 98 mg/dL (ref 65–99)
Potassium: 3.9 mmol/L (ref 3.5–5.3)
Sodium: 143 mmol/L (ref 135–146)

## 2019-08-29 LAB — TSH: TSH: 0.53 mIU/L (ref 0.40–4.50)

## 2019-09-10 ENCOUNTER — Other Ambulatory Visit: Payer: Self-pay | Admitting: Nurse Practitioner

## 2019-09-10 DIAGNOSIS — M542 Cervicalgia: Secondary | ICD-10-CM

## 2019-09-10 DIAGNOSIS — S134XXD Sprain of ligaments of cervical spine, subsequent encounter: Secondary | ICD-10-CM

## 2019-09-10 NOTE — Telephone Encounter (Signed)
Routing to provider for approval. Medication indicates drug allergy warning alert

## 2019-11-04 ENCOUNTER — Ambulatory Visit (INDEPENDENT_AMBULATORY_CARE_PROVIDER_SITE_OTHER): Payer: Medicare HMO | Admitting: Family

## 2019-11-04 ENCOUNTER — Encounter: Payer: Self-pay | Admitting: Family

## 2019-11-04 ENCOUNTER — Other Ambulatory Visit: Payer: Self-pay

## 2019-11-04 DIAGNOSIS — Z0000 Encounter for general adult medical examination without abnormal findings: Secondary | ICD-10-CM

## 2019-11-04 NOTE — Progress Notes (Signed)
This service is provided via telemedicine  No vital signs collected/recorded due to the encounter was a telemedicine visit.   Location of patient (ex: home, work):  Home   Patient consents to a telephone visit:  Yes  Location of the provider (ex: office, home):  Office   Name of any referring provider: Sherrie Mustache, NP   Names of all persons participating in the telemedicine service and their role in the encounter:  Marlowe Sax, NP, Ruthell Rummage CMA, and Patient   Time spent on call:  Ruthell Rummage CMA spent 11 minutes on phone with patient

## 2019-11-04 NOTE — Patient Instructions (Signed)
Colleen [name] , Thank you for taking time to come for your Medicare Wellness Visit. I appreciate your ongoing commitment to your health goals. Please review the following plan we discussed and let me know if I can assist you in the future.   Screening recommendations/referrals: Colonoscopy: Up to date due 11/02/2028  Mammogram: Up to date  Bone Density: Up to date  Recommended yearly ophthalmology/optometry visit for glaucoma screening and checkup Recommended yearly dental visit for hygiene and checkup  Vaccinations: Influenza vaccine : Up to date Pneumococcal vaccine : Up to date Tdap vaccine : Up to date due 10/21/2025  Shingles vaccine : Up to date   Advanced directives: Yes   Conditions/risks identified: Advance age female > 4 yrs,dyslipidemia,Obesity BMI > 30,Hx Smoking,sedentary lifestyle  Next appointment: 1 year    Preventive Care 76 Years and Older, Female Preventive care refers to lifestyle choices and visits with your health care provider that can promote health and wellness. What does preventive care include?  A yearly physical exam. This is also called an annual well check.  Dental exams once or twice a year.  Routine eye exams. Ask your health care provider how often you should have your eyes checked.  Personal lifestyle choices, including:  Daily care of your teeth and gums.  Regular physical activity.  Eating a healthy diet.  Avoiding tobacco and drug use.  Limiting alcohol use.  Practicing safe sex.  Taking low-dose aspirin every day.  Taking vitamin and mineral supplements as recommended by your health care provider. What happens during an annual well check? The services and screenings done by your health care provider during your annual well check will depend on your age, overall health, lifestyle risk factors, and family history of disease. Counseling  Your health care provider may ask you questions about your:  Alcohol use.  Tobacco  use.  Drug use.  Emotional well-being.  Home and relationship well-being.  Sexual activity.  Eating habits.  History of falls.  Memory and ability to understand (cognition).  Work and work Statistician.  Reproductive health. Screening  You may have the following tests or measurements:  Height, weight, and BMI.  Blood pressure.  Lipid and cholesterol levels. These may be checked every 5 years, or more frequently if you are over 76 years old.  Skin check.  Lung cancer screening. You may have this screening every year starting at age 76 if you have a 30-pack-year history of smoking and currently smoke or have quit within the past 15 years.  Fecal occult blood test (FOBT) of the stool. You may have this test every year starting at age 76.  Flexible sigmoidoscopy or colonoscopy. You may have a sigmoidoscopy every 5 years or a colonoscopy every 10 years starting at age 76.  Hepatitis C blood test.  Hepatitis B blood test.  Sexually transmitted disease (STD) testing.  Diabetes screening. This is done by checking your blood sugar (glucose) after you have not eaten for a while (fasting). You may have this done every 1-3 years.  Bone density scan. This is done to screen for osteoporosis. You may have this done starting at age 76.  Mammogram. This may be done every 1-2 years. Talk to your health care provider about how often you should have regular mammograms. Talk with your health care provider about your test results, treatment options, and if necessary, the need for more tests. Vaccines  Your health care provider may recommend certain vaccines, such as:  Influenza vaccine. This is  recommended every year.  Tetanus, diphtheria, and acellular pertussis (Tdap, Td) vaccine. You may need a Td booster every 10 years.  Zoster vaccine. You may need this after age 76.  Pneumococcal 13-valent conjugate (PCV13) vaccine. One dose is recommended after age 76.  Pneumococcal  polysaccharide (PPSV23) vaccine. One dose is recommended after age 76. Talk to your health care provider about which screenings and vaccines you need and how often you need them. This information is not intended to replace advice given to you by your health care provider. Make sure you discuss any questions you have with your health care provider. Document Released: 11/13/2015 Document Revised: 07/06/2016 Document Reviewed: 08/18/2015 Elsevier Interactive Patient Education  2017 [employer] Prevention in the Home Falls can cause injuries. They can happen to people of all ages. There are many things you can do to make your home safe and to help prevent falls. What can I do on the outside of my home?  Regularly fix the edges of walkways and driveways and fix any cracks.  Remove anything that might make you trip as you walk through a door, such as a raised step or threshold.  Trim any bushes or trees on the path to your home.  Use bright outdoor lighting.  Clear any walking paths of anything that might make someone trip, such as rocks or tools.  Regularly check to see if handrails are loose or broken. Make sure that both sides of any steps have handrails.  Any raised decks and porches should have guardrails on the edges.  Have any leaves, snow, or ice cleared regularly.  Use sand or salt on walking paths during winter.  Clean up any spills in your garage right away. This includes oil or grease spills. What can I do in the bathroom?  Use night lights.  Install grab bars by the toilet and in the tub and shower. Do not use towel bars as grab bars.  Use non-skid mats or decals in the tub or shower.  If you need to sit down in the shower, use a plastic, non-slip stool.  Keep the floor dry. Clean up any water that spills on the floor as soon as it happens.  Remove soap buildup in the tub or shower regularly.  Attach bath mats securely with double-sided non-slip rug  tape.  Do not have throw rugs and other things on the floor that can make you trip. What can I do in the bedroom?  Use night lights.  Make sure that you have a light by your bed that is easy to reach.  Do not use any sheets or blankets that are too big for your bed. They should not hang down onto the floor.  Have a firm chair that has side arms. You can use this for support while you get dressed.  Do not have throw rugs and other things on the floor that can make you trip. What can I do in the kitchen?  Clean up any spills right away.  Avoid walking on wet floors.  Keep items that you use a lot in easy-to-reach places.  If you need to reach something above you, use a strong step stool that has a grab bar.  Keep electrical cords out of the way.  Do not use floor polish or wax that makes floors slippery. If you must use wax, use non-skid floor wax.  Do not have throw rugs and other things on the floor that can make you trip.  What can I do with my stairs?  Do not leave any items on the stairs.  Make sure that there are handrails on both sides of the stairs and use them. Fix handrails that are broken or loose. Make sure that handrails are as long as the stairways.  Check any carpeting to make sure that it is firmly attached to the stairs. Fix any carpet that is loose or worn.  Avoid having throw rugs at the top or bottom of the stairs. If you do have throw rugs, attach them to the floor with carpet tape.  Make sure that you have a light switch at the top of the stairs and the bottom of the stairs. If you do not have them, ask someone to add them for you. What else can I do to help prevent falls?  Wear shoes that:  Do not have high heels.  Have rubber bottoms.  Are comfortable and fit you well.  Are closed at the toe. Do not wear sandals.  If you use a stepladder:  Make sure that it is fully opened. Do not climb a closed stepladder.  Make sure that both sides of the  stepladder are locked into place.  Ask someone to hold it for you, if possible.  Clearly mark and make sure that you can see:  Any grab bars or handrails.  First and last steps.  Where the edge of each step is.  Use tools that help you move around (mobility aids) if they are needed. These include:  Canes.  Walkers.  Scooters.  Crutches.  Turn on the lights when you go into a dark area. Replace any light bulbs as soon as they burn out.  Set up your furniture so you have a clear path. Avoid moving your furniture around.  If any of your floors are uneven, fix them.  If there are any pets around you, be aware of where they are.  Review your medicines with your doctor. Some medicines can make you feel dizzy. This can increase your chance of falling. Ask your doctor what other things that you can do to help prevent falls. This information is not intended to replace advice given to you by your health care provider. Make sure you discuss any questions you have with your health care provider. Document Released: 08/13/2009 Document Revised: 03/24/2016 Document Reviewed: 11/21/2014 Elsevier Interactive Patient Education  2017 [employer].

## 2019-11-04 NOTE — Progress Notes (Signed)
Subjective:   Colleen [name] is a 74 y.o. female who presents for Medicare Annual (Subsequent) preventive examination.  Review of Systems:   Cardiac Risk Factors include: advanced age (>2men, >80 women);dyslipidemia;obesity (BMI >30kg/m2);smoking/ tobacco exposure;sedentary lifestyle     Objective:     Vitals: There were no vitals taken for this visit.  There is no height or weight on file to calculate BMI.  Advanced Directives 11/04/2019 08/28/2019 02/28/2019 10/29/2018 08/06/2018 10/26/2017 05/12/2017  Does Patient Have a Medical Advance Directive? Yes No Yes Yes No Yes No  Type of [employer] Power of [place];Living will - [place] -  Does patient want to make changes to medical advance directive? No - Patient declined - No - Patient declined No - Patient declined - No - Patient declined -  Copy of [place] in Chart? Yes - validated most recent copy scanned in chart (See row information) - Yes - validated most recent copy scanned in chart (See row information) No - copy requested - Yes -  Would patient like information on creating a medical advance directive? - No - Patient declined - - - - -    Tobacco Social History   Tobacco Use  Smoking Status Former Smoker  . Types: Cigarettes  . Quit date: 10/31/1986  . Years since quitting: 33.0  Smokeless Tobacco Never Used     Counseling given: Not Answered   Clinical Intake:  Pre-visit preparation completed: No  Pain : 0-10 Pain Score: 8  Pain Type: Chronic pain Pain Location: Hip Pain Orientation: Right Pain Radiating Towards: no Pain Descriptors / Indicators: Aching, Sharp Pain Onset: Other (comment)(several years) Pain Frequency: Intermittent Pain Relieving Factors: topical analgesic,Pain medication Effect of Pain on Daily Activities: OTC Rub on  Pain Relieving Factors: topical analgesic,Pain  medication  BMI - recorded: 31.93 Nutritional Status: BMI > 30  Obese Nutritional Risks: None Diabetes: No  How often do you need to have someone help you when you read instructions, pamphlets, or other written materials from your doctor or pharmacy?: 1 - Never What is the last grade level you completed in school?: 12 Grade  Interpreter Needed?: No  Information entered by :: Marquitta Persichetti FNP-C  Past Medical History:  Diagnosis Date  . Allergic rhinitis   . Anxiety   . Arthritis   . Colon polyps    adenomatous  . Corns and callosities   . Diverticulosis   . GERD (gastroesophageal reflux disease)   . Glaucoma   . Hypercholesteremia    no meds  . Hypothyroidism   . Obesity   . Screening for diabetes mellitus   . Urge incontinence    Past Surgical History:  Procedure Laterality Date  . ABDOMINAL HYSTERECTOMY  1985   Dr Mancel Bale  . BUNIONECTOMY Right    Dr Jomarie Longs  . CARPAL TUNNEL RELEASE Bilateral 2010   Dr Laurance Flatten  . [place]   1 time  . COLONOSCOPY    . ROTATOR CUFF REPAIR Right    Dr Theda Sers  . THYROIDECTOMY  1987   Dr Guadlupe Spanish  . TONSILLECTOMY  1951   Family History  Problem Relation Age of Onset  . Leukemia Mother 21  . Lung cancer Father   . Diabetes Brother   . COPD Brother   . Lung cancer Brother   . Diabetes Maternal Grandmother   . Cancer Maternal Grandfather  unknown type  . Breast cancer Maternal Aunt   . Colon polyps Neg Hx   . Rectal cancer Neg Hx    Social History   Socioeconomic History  . Marital status: Widowed    Spouse name: Not on file  . Number of children: 1  . Years of education: Not on file  . Highest education level: Not on file  Occupational History  . Occupation: retired  Tobacco Use  . Smoking status: Former Smoker    Types: Cigarettes    Quit date: 10/31/1986    Years since quitting: 33.0  . Smokeless tobacco: Never Used  Substance and Sexual Activity  . Alcohol use: No    Alcohol/week: 0.0  standard drinks  . Drug use: No  . Sexual activity: Never  Other Topics Concern  . Not on file  Social History Narrative  . Not on file   Social Determinants of Health   Financial Resource Strain:   . Difficulty of Paying Living Expenses: Not on file  Food Insecurity:   . Worried About Charity fundraiser in the Last Year: Not on file  . Ran Out of Food in the Last Year: Not on file  Transportation Needs:   . Lack of Transportation (Medical): Not on file  . Lack of Transportation (Non-Medical): Not on file  Physical Activity:   . Days of Exercise per Week: Not on file  . Minutes of Exercise per Session: Not on file  Stress:   . Feeling of Stress : Not on file  Social Connections:   . Frequency of Communication with Friends and Family: Not on file  . Frequency of Social Gatherings with Friends and Family: Not on file  . Attends Religious Services: Not on file  . Active Member of Clubs or Organizations: Not on file  . Attends Archivist Meetings: Not on file  . Marital Status: Not on file    Outpatient Encounter Medications as of 11/04/2019  Medication Sig  . acetaminophen (TYLENOL) 500 MG tablet Take 1,000 mg by mouth every 6 (six) hours as needed for mild pain.  . Ascorbic Acid (VITAMIN C PO) Take 1 tablet by mouth daily.  Marland Kitchen aspirin 81 MG tablet Take 81 mg by mouth daily.  Marland Kitchen CALCIUM-VITAMIN D PO Take 2 tablets by mouth daily.   Marland Kitchen CRANBERRY PO Take 1 tablet by mouth daily.  . diclofenac Sodium (VOLTAREN) 1 % GEL Apply 2 g topically 2 (two) times daily as needed.  . hydroxypropyl methylcellulose / hypromellose (ISOPTO TEARS / GONIOVISC) 2.5 % ophthalmic solution Place 1 drop into both eyes 4 (four) times daily as needed for dry eyes.  Marland Kitchen levothyroxine (SYNTHROID) 50 MCG tablet TAKE 1 TABLET BY MOUTH EVERY DAY  . LINZESS 145 MCG CAPS capsule TAKE 1 CAPSULE(145 MCG) BY MOUTH DAILY BEFORE BREAKFAST  . meloxicam (MOBIC) 7.5 MG tablet TAKE 1 TABLET(7.5 MG) BY MOUTH DAILY   . Multiple Vitamin (MULTIVITAMIN WITH MINERALS) TABS Take 1 tablet by mouth daily.  Marland Kitchen omeprazole (PRILOSEC) 40 MG capsule TAKE 1 CAPSULE(40 MG) BY MOUTH DAILY  . rOPINIRole (REQUIP) 0.25 MG tablet TAKE 1 TABLET BY MOUTH EVERY DAY AFTER SUPPER. TAKE 1-2 HRS BEFORE GOING TO BEDTIME  . tizanidine (ZANAFLEX) 2 MG capsule Take 1 capsule (2 mg total) by mouth 3 (three) times daily as needed for muscle spasms.  . traMADol (ULTRAM) 50 MG tablet TAKE 1 TABLET BY MOUTH EVERY 6 HOURS AS NEEDED  . [DISCONTINUED] diclofenac sodium (VOLTAREN) 1 %  GEL Apply 2 g topically 2 (two) times daily. (Patient taking differently: Apply 2 g topically 2 (two) times daily. As needed)   No facility-administered encounter medications on file as of 11/04/2019.    Activities of Daily Living In your present state of health, do you have any difficulty performing the following activities: 11/04/2019  Hearing? N  Vision? N  Difficulty concentrating or making decisions? N  Walking or climbing stairs? N  Dressing or bathing? N  Doing errands, shopping? N  Preparing Food and eating ? N  Using the Toilet? N  In the past six months, have you accidently leaked urine? N  Do you have problems with loss of bowel control? N  Managing your Medications? N  Managing your Finances? N  Housekeeping or managing your Housekeeping? N  Some recent data might be hidden    Patient Care Team: [name], NP as PCP - General (Nurse Practitioner)    Assessment:   This is a routine wellness examination for [facility].  Exercise Activities and Dietary recommendations Exercise limited by: Other - see comments(right hip pain and back pain)  Goals    . Weight (lb) < 170 lb (77.1 kg)     Starting 10/20/16, I will attempt to decrease my weight to get to 170 lbs.       Fall Risk Fall Risk  11/04/2019 08/28/2019 02/28/2019 10/29/2018 08/06/2018  Falls in the past year? 0 0 0 0 No  Number falls in past yr: 0 0 0 - -  Injury with Fall? 0 0  0 - -   Is the patient's home free of loose throw rugs in walkways, pet beds, electrical cords, etc?   no      Grab bars in the bathroom? no      Handrails on the stairs?   no      Adequate lighting?   yes  Depression Screen PHQ 2/9 Scores 11/04/2019 08/28/2019 10/29/2018 08/06/2018  PHQ - 2 Score 0 0 0 0     Cognitive Function MMSE - Mini Mental State Exam 10/29/2018 10/26/2017 10/20/2016 10/22/2015 07/08/2014  Not completed: - - - (No Data) -  Orientation to time 5 4 5 5 5   Orientation to Place 5 5 5 5 5   Registration 3 3 3 3 3   Attention/ Calculation 5 5 5 5 5   Recall 2 2 3 3 2   Language- name 2 objects 2 2 2 2 2   Language- repeat 1 1 1 1 1   Language- follow 3 step command 3 3 3 2 3   Language- read & follow direction 1 1 1 1 1   Write a sentence 1 1 1 1 1   Copy design 1 1 1 1 1   Total score 29 28 30 29 29      6CIT Screen 11/04/2019  What Year? 0 points  What month? 0 points  What time? 0 points  Count back from 20 0 points  Months in reverse 0 points  Repeat phrase 0 points  Total Score 0    Immunization History  Administered Date(s) Administered  . Influenza, High Dose Seasonal PF 08/26/2017, 08/06/2018, 07/28/2019  . Influenza,inj,Quad PF,6+ Mos 07/08/2014  . Influenza-Unspecified 08/09/2012, 08/01/2013, 06/30/2015, 08/04/2016, 07/31/2017  . Pneumococcal Conjugate-13 11/03/2014  . Pneumococcal Polysaccharide-23 02/19/2013, 08/20/2017  . Td 10/31/2000  . Tdap 10/22/2015  . Varicella 06/15/2011  . Zoster 10/31/2010  . Zoster Recombinat (Shingrix) 03/21/2017, 06/04/2017    Qualifies for Shingles Vaccine? Up to date   Screening Tests  Health Maintenance  Topic Date Due  . TETANUS/TDAP  10/21/2025  . COLONOSCOPY  11/02/2028  . INFLUENZA VACCINE  Completed  . DEXA SCAN  Completed  . Hepatitis C Screening  Completed  . PNA vac Low Risk Adult  Completed    Cancer Screenings: Lung: Low Dose CT Chest recommended if Age 56-80 years, 30 pack-year currently smoking  OR have quit w/in 15years. Patient does not qualify. Breast:  Up to date on Mammogram? Yes   Up to date of Bone Density/Dexa? Yes Colorectal: Up to date   Additional Screenings: Hepatitis C Screening: Low       Plan:   - increase physical activity/exercise at least x 3/week for 30 minutes - dietary modification advised low carbohydrates,low saturated fats and high vegetable diet.     I have personally reviewed and noted the following in the patient's chart:   . Medical and social history . Use of alcohol, tobacco or illicit drugs  . Current medications and supplements . Functional ability and status . Nutritional status . Physical activity . Advanced directives . List of other physicians . Hospitalizations, surgeries, and ER visits in previous 12 months . Vitals . Screenings to include cognitive, depression, and falls . Referrals and appointments  In addition, I have reviewed and discussed with patient certain preventive protocols, quality metrics, and best practice recommendations. A written personalized care plan for preventive services as well as general preventive health recommendations were provided to patient.   Sandrea Hughs, NP  11/04/2019

## 2019-11-17 ENCOUNTER — Other Ambulatory Visit: Payer: Self-pay | Admitting: Nurse Practitioner

## 2019-11-17 DIAGNOSIS — E039 Hypothyroidism, unspecified: Secondary | ICD-10-CM

## 2019-11-21 ENCOUNTER — Other Ambulatory Visit: Payer: Self-pay | Admitting: *Deleted

## 2019-11-21 ENCOUNTER — Other Ambulatory Visit: Payer: Self-pay

## 2019-11-21 DIAGNOSIS — G8929 Other chronic pain: Secondary | ICD-10-CM

## 2019-11-21 DIAGNOSIS — M542 Cervicalgia: Secondary | ICD-10-CM

## 2019-11-21 DIAGNOSIS — M545 Low back pain, unspecified: Secondary | ICD-10-CM

## 2019-11-21 DIAGNOSIS — S134XXD Sprain of ligaments of cervical spine, subsequent encounter: Secondary | ICD-10-CM

## 2019-11-21 MED ORDER — OMEPRAZOLE 40 MG PO CPDR: DELAYED_RELEASE_CAPSULE | ORAL | 1 refills | Status: DC

## 2019-11-21 NOTE — Addendum Note (Signed)
Addended by: Ruthell Rummage A on: 11/21/2019 12:43 PM   Modules accepted: Orders

## 2019-11-21 NOTE — Telephone Encounter (Signed)
Tried calling patient to schedule 6 month follow up appointment. Also patient is in need for non opioid contract update. Left message for patient to call back and schedule appointment. Last appointment was 08/28/2019 and last refill for Tramadol was 02/28/2019   Routing to provider for approval.

## 2019-11-22 NOTE — Telephone Encounter (Signed)
Pt will wait until April for the refill.

## 2019-11-22 NOTE — Telephone Encounter (Signed)
Will need to have follow up appt scheduled before refill will be approved.

## 2019-11-22 NOTE — Telephone Encounter (Signed)
Yes she needs to update pain contract if she does not have an updated one on file.

## 2019-11-22 NOTE — Telephone Encounter (Signed)
Pt has appt 02/26/2020, you want her seen sooner?

## 2019-11-26 ENCOUNTER — Other Ambulatory Visit: Payer: Self-pay | Admitting: Internal Medicine

## 2019-11-26 ENCOUNTER — Other Ambulatory Visit: Payer: Self-pay

## 2019-11-26 ENCOUNTER — Ambulatory Visit (INDEPENDENT_AMBULATORY_CARE_PROVIDER_SITE_OTHER): Payer: Medicare HMO | Admitting: Family

## 2019-11-26 ENCOUNTER — Encounter: Payer: Self-pay | Admitting: Family

## 2019-11-26 VITALS — BP 132/68 | HR 61 | Temp 98.1°F | Ht 61.0 in | Wt 173.0 lb

## 2019-11-26 DIAGNOSIS — M8949 Other hypertrophic osteoarthropathy, multiple sites: Secondary | ICD-10-CM

## 2019-11-26 DIAGNOSIS — G8929 Other chronic pain: Secondary | ICD-10-CM

## 2019-11-26 DIAGNOSIS — M545 Low back pain, unspecified: Secondary | ICD-10-CM

## 2019-11-26 DIAGNOSIS — S134XXD Sprain of ligaments of cervical spine, subsequent encounter: Secondary | ICD-10-CM

## 2019-11-26 DIAGNOSIS — M542 Cervicalgia: Secondary | ICD-10-CM | POA: Diagnosis not present

## 2019-11-26 DIAGNOSIS — M159 Polyosteoarthritis, unspecified: Secondary | ICD-10-CM

## 2019-11-26 MED ORDER — TRAMADOL HCL 50 MG PO TABS: ORAL_TABLET | ORAL | 0 refills | Status: DC

## 2019-11-26 MED ORDER — KETOROLAC TROMETHAMINE 30 MG/ML IJ SOLN
30.0000 mg | Freq: Once | INTRAMUSCULAR | Status: AC
  Administered 2019-11-26: 11:00:00 30 mg via INTRAMUSCULAR
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
## 2019-11-26 NOTE — Patient Instructions (Signed)
-   continue with current pain medication for neck pain and Osteoarthritis.Tramdol prescription send to pharmacy today. - Notify provider's office if pain worsen or not relieved. - continue to apply warm compressor for neck pain

## 2019-12-19 ENCOUNTER — Telehealth: Payer: Self-pay | Admitting: *Deleted

## 2019-12-19 NOTE — Telephone Encounter (Signed)
Received Prior Authorization from [facility] for patient's Diclofenac. Initiated through [facility].  Went into determination. 48-72 hours.  Key: [id number] PA Case ID: [id number]

## 2019-12-19 NOTE — Telephone Encounter (Signed)
Received fax from [facility] [phone] and Diclofenac Sodium Gel has been APPROVED from 11/01/2019-10/30/2020.  Member ID: Colleen [name]

## 2020-01-01 ENCOUNTER — Other Ambulatory Visit: Payer: Self-pay | Admitting: *Deleted

## 2020-01-01 DIAGNOSIS — S134XXD Sprain of ligaments of cervical spine, subsequent encounter: Secondary | ICD-10-CM

## 2020-01-01 DIAGNOSIS — M159 Polyosteoarthritis, unspecified: Secondary | ICD-10-CM

## 2020-01-01 DIAGNOSIS — G8929 Other chronic pain: Secondary | ICD-10-CM

## 2020-01-01 DIAGNOSIS — M542 Cervicalgia: Secondary | ICD-10-CM

## 2020-01-01 DIAGNOSIS — M545 Low back pain, unspecified: Secondary | ICD-10-CM

## 2020-01-01 MED ORDER — TRAMADOL HCL 50 MG PO TABS: ORAL_TABLET | ORAL | 0 refills | Status: DC

## 2020-01-01 NOTE — Telephone Encounter (Signed)
Received fax refill Request from [place] Verified LR: 11/26/2019 Contract updated Pended Rx and sent to [facility] for approval.

## 2020-01-24 ENCOUNTER — Other Ambulatory Visit: Payer: Self-pay

## 2020-01-24 DIAGNOSIS — S134XXD Sprain of ligaments of cervical spine, subsequent encounter: Secondary | ICD-10-CM

## 2020-01-24 DIAGNOSIS — M542 Cervicalgia: Secondary | ICD-10-CM

## 2020-01-24 DIAGNOSIS — E039 Hypothyroidism, unspecified: Secondary | ICD-10-CM

## 2020-01-24 MED ORDER — MELOXICAM 7.5 MG PO TABS: ORAL_TABLET | ORAL | 1 refills | Status: DC

## 2020-01-24 MED ORDER — LEVOTHYROXINE SODIUM 50 MCG PO TABS: 50.0000 ug | ORAL_TABLET | Freq: Every day | ORAL | 0 refills | Status: DC

## 2020-01-24 MED ORDER — DICLOFENAC SODIUM 1 % EX GEL: 2.0000 g | Freq: Two times a day (BID) | CUTANEOUS | 1 refills | Status: DC | PRN

## 2020-01-24 MED ORDER — ROPINIROLE HCL 0.25 MG PO TABS: ORAL_TABLET | ORAL | 1 refills | Status: DC

## 2020-01-24 NOTE — Telephone Encounter (Signed)
Patient has requested all medications to be refilled. Patient next appointment is 02/26/2020. Please Advise.

## 2020-02-11 ENCOUNTER — Other Ambulatory Visit: Payer: Self-pay | Admitting: Nurse Practitioner

## 2020-02-11 DIAGNOSIS — M542 Cervicalgia: Secondary | ICD-10-CM

## 2020-02-11 DIAGNOSIS — S134XXD Sprain of ligaments of cervical spine, subsequent encounter: Secondary | ICD-10-CM

## 2020-02-11 DIAGNOSIS — E039 Hypothyroidism, unspecified: Secondary | ICD-10-CM

## 2020-02-11 NOTE — Telephone Encounter (Signed)
All 3 medications requested were approved on 01/24/2020 and should be on file at the pharmacy

## 2020-02-24 ENCOUNTER — Ambulatory Visit (INDEPENDENT_AMBULATORY_CARE_PROVIDER_SITE_OTHER): Payer: Medicare HMO | Admitting: Family

## 2020-02-24 ENCOUNTER — Encounter: Payer: Self-pay | Admitting: Family

## 2020-02-24 ENCOUNTER — Other Ambulatory Visit: Payer: Self-pay

## 2020-02-24 VITALS — Temp 100.1°F | Ht 61.0 in | Wt 173.0 lb

## 2020-02-24 DIAGNOSIS — J069 Acute upper respiratory infection, unspecified: Secondary | ICD-10-CM | POA: Diagnosis not present

## 2020-02-24 MED ORDER — DOXYCYCLINE HYCLATE 100 MG PO TABS: 100.0000 mg | ORAL_TABLET | Freq: Two times a day (BID) | ORAL | 0 refills | Status: AC

## 2020-02-24 MED ORDER — GUAIFENESIN-DM 100-10 MG/5ML PO SYRP: 5.0000 mL | ORAL_SOLUTION | ORAL | 0 refills | Status: DC | PRN

## 2020-02-24 MED ORDER — SACCHAROMYCES BOULARDII 250 MG PO CAPS: 250.0000 mg | ORAL_CAPSULE | Freq: Two times a day (BID) | ORAL | 0 refills | Status: AC

## 2020-02-24 NOTE — Progress Notes (Signed)
This service is provided via telemedicine  No vital signs collected/recorded due to the encounter was a telemedicine visit.   Location of patient (ex: home, work):  Home  Patient consents to a telephone visit:  Yes  Location of the provider (ex: office, home):  [place] office  Name of any referring provider:  Sherrie Mustache, NP  Names of all persons participating in the telemedicine service and their role in the encounter:  Marlowe Sax, NP; Bonney Leitz, [place]; Bernadette Hoit; Darolyn Rua, daughter  Time spent on call:  CMA time 8 minutes  Provider: Odelia Graciano FNP-C  [name], NP  Patient Care Team: [name], NP as PCP - General (Nurse Practitioner)  Extended Emergency Contact Information Primary Emergency Contact: [facility] Address: [address]          [place], [place] [address] [place] Phone: [phone] Relation: Daughter  Code Status: Full Code  Goals of care: Advanced Directive information Advanced Directives 11/04/2019  Does Patient Have a Medical Advance Directive? Yes  Type of Advance Directive [place]  Does patient want to make changes to medical advance directive? No - Patient declined  Copy of [place] in Chart? Yes - validated most recent copy scanned in chart (See row information)  Would patient like information on creating a medical advance directive? -     Chief Complaint  Patient presents with  . Acute Visit    Televisit due to symptoms of cold, fever, headache, cough. Has had both COVID vaccines.     HPI:  Pt is a 74 y.o. female seen today for an acute visit for evaluation of cough,fever and headache.Her daughter Aldona Bar who is a Designer, jewellery present during visit.Daughter had her stethoscope listen to patient's lungs states clear bilaterally.Has had cough for several weeks.states cough started with her grandson, then her daughter and son-in law.Son  in law was treated for bronchitis.The family members got better but hers seems to be worst.Had fever Temp 100.0 yesterday but temp is down to 98.0 this morning.Coughing makes her head hurt.Also has clear nasal drainage.Cough also keeps her awake at night.Has taken tylenol 1000 mg tablet every 6 hours and Mucinex for cough but not helping.she denies any chest tightness,chest pain,shortness of breath or wheezing.Also denies any N/V/D,loss of smell or tasteshe has had x 2 of her COVID-19 vaccine.Her family members have completed their COVID-19 vaccine too.she has not been in contact with person sick with [place].   Past Medical History:  Diagnosis Date  . Allergic rhinitis   . Anxiety   . Arthritis   . Colon polyps    adenomatous  . Corns and callosities   . Diverticulosis   . GERD (gastroesophageal reflux disease)   . Glaucoma   . Hypercholesteremia    no meds  . Hypothyroidism   . Obesity   . Screening for diabetes mellitus   . Urge incontinence    Past Surgical History:  Procedure Laterality Date  . ABDOMINAL HYSTERECTOMY  1985   Dr Mancel Bale  . BUNIONECTOMY Right    Dr Jomarie Longs  . CARPAL TUNNEL RELEASE Bilateral 2010   Dr Laurance Flatten  . [place]   1 time  . COLONOSCOPY    . ROTATOR CUFF REPAIR Right    Dr Theda Sers  . THYROIDECTOMY  1987   Dr Guadlupe Spanish  . TONSILLECTOMY  1951    No Known Allergies  Outpatient Encounter Medications as of 02/24/2020  Medication Sig  .  acetaminophen (TYLENOL) 500 MG tablet Take 1,000 mg by mouth every 6 (six) hours as needed for mild pain.  . Ascorbic Acid (VITAMIN C PO) Take 1 tablet by mouth daily.  Marland Kitchen aspirin 81 MG tablet Take 81 mg by mouth daily.  Marland Kitchen CALCIUM-VITAMIN D PO Take 2 tablets by mouth daily.   Marland Kitchen CRANBERRY PO Take 1 tablet by mouth daily.  . diclofenac Sodium (VOLTAREN) 1 % GEL Apply 2 g topically 2 (two) times daily as needed.  . hydroxypropyl methylcellulose / hypromellose (ISOPTO TEARS / GONIOVISC) 2.5 % ophthalmic  solution Place 1 drop into both eyes 4 (four) times daily as needed for dry eyes.  Marland Kitchen levothyroxine (SYNTHROID) 50 MCG tablet Take 1 tablet (50 mcg total) by mouth daily.  Marland Kitchen LINZESS 145 MCG CAPS capsule TAKE 1 CAPSULE(145 MCG) BY MOUTH DAILY BEFORE BREAKFAST  . meloxicam (MOBIC) 7.5 MG tablet TAKE 1 TABLET(7.5 MG) BY MOUTH DAILY  . Multiple Vitamin (MULTIVITAMIN WITH MINERALS) TABS Take 1 tablet by mouth daily.  Marland Kitchen omeprazole (PRILOSEC) 40 MG capsule TAKE 1 CAPSULE(40 MG) BY MOUTH DAILY  . rOPINIRole (REQUIP) 0.25 MG tablet TAKE 1 TABLET BY MOUTH EVERY DAY AFTER SUPPER. TAKE 1-2 HRS BEFORE GOING TO BEDTIME  . tizanidine (ZANAFLEX) 2 MG capsule Take 1 capsule (2 mg total) by mouth 3 (three) times daily as needed for muscle spasms.  . traMADol (ULTRAM) 50 MG tablet Take one tablet by mouth every 6 hours as needed for pain   No facility-administered encounter medications on file as of 02/24/2020.    Review of Systems  Constitutional: Negative for appetite change, chills and fatigue.       Temp 100 yesterday and 98.0   HENT: Positive for rhinorrhea. Negative for congestion, sinus pressure, sinus pain, sneezing and sore throat.   Eyes: Negative for discharge, redness and itching.  Respiratory: Positive for cough. Negative for chest tightness, shortness of breath and wheezing.   Cardiovascular: Negative for chest pain, palpitations and leg swelling.  Gastrointestinal: Negative for abdominal distention, abdominal pain, diarrhea, nausea and vomiting.  Genitourinary: Negative for difficulty urinating, dysuria, flank pain, frequency and urgency.  Musculoskeletal: Positive for arthralgias. Negative for myalgias.  Skin: Negative for color change, pallor and rash.  Neurological: Negative for dizziness, speech difficulty and light-headedness.  Psychiatric/Behavioral: Negative for agitation and confusion.       Cough Keeps her awake     Immunization History  Administered Date(s) Administered  .  Influenza, High Dose Seasonal PF 08/26/2017, 08/06/2018, 07/28/2019  . Influenza,inj,Quad PF,6+ Mos 07/08/2014  . Influenza-Unspecified 08/09/2012, 08/01/2013, 06/30/2015, 08/04/2016, 07/31/2017  . PFIZER SARS-COV-2 Vaccination 11/19/2019, 12/10/2019  . Pneumococcal Conjugate-13 11/03/2014  . Pneumococcal Polysaccharide-23 02/19/2013, 08/20/2017  . Td 10/31/2000  . Tdap 10/22/2015  . Varicella 06/15/2011  . Zoster 10/31/2010  . Zoster Recombinat (Shingrix) 03/21/2017, 06/04/2017   Pertinent  Health Maintenance Due  Topic Date Due  . INFLUENZA VACCINE  05/31/2020  . COLONOSCOPY  11/02/2028  . DEXA SCAN  Completed  . PNA vac Low Risk Adult  Completed   Fall Risk  02/24/2020 11/26/2019 11/04/2019 08/28/2019 02/28/2019  Falls in the past year? 1 0 0 0 0  Comment Misstep on curb - - - -  Number falls in past yr: 0 0 0 0 0  Injury with Fall? 1 0 0 0 0  Comment Bruising - - - -    Vitals:   02/24/20 0831  Temp: 100.1 F (37.8 C)  TempSrc: Oral  Weight: 173 lb (  78.5 kg)  Height: 5\' 1"  (1.549 m)   Body mass index is 32.69 kg/m. Physical Exam  Unable to complete on Telephone  Labs reviewed: Recent Labs    02/26/19 0855 06/13/19 0950 08/28/19 1012  NA 145  --  143  K 3.9  --  3.9  CL 109  --  106  CO2 29  --  28  GLUCOSE 101*  --  98  BUN 18  --  20  CREATININE 0.82 0.90 0.96*  CALCIUM 9.8  --  10.1   Recent Labs    02/26/19 0855  AST 26  ALT 13  BILITOT 0.4  PROT 6.6   Recent Labs    08/28/19 1012  WBC 5.5  NEUTROABS 2,921  HGB 12.7  HCT 39.7  MCV 95.9  PLT 252   Lab Results  Component Value Date   TSH 0.53 08/28/2019   Lab Results  Component Value Date   HGBA1C 5.6 07/05/2014   Lab Results  Component Value Date   CHOL 262 (H) 08/28/2019   HDL 95 08/28/2019   LDLCALC 150 (H) 08/28/2019   TRIG 73 08/28/2019   CHOLHDL 2.8 08/28/2019    Significant Diagnostic Results in last 30 days:  No results found.  Assessment/Plan  Acute upper  respiratory infection Temp 100.1 took tylenol down to 98.0 - ongoing cough for several weeks.No shortness of breath or wheezing.bilateral lungs clear per daughter who is a Designer, jewellery request treatment with oral antibiotics since cough has lasted several weeks.Also request Tussinex to help with cough.Discussed starting on Z-pak but daughter prefers Doxycycline.Advised to take antibiotics along with a Probiotics as below.  - doxycycline (VIBRA-TABS) 100 MG tablet; Take 1 tablet (100 mg total) by mouth 2 (two) times daily for 7 days.  Dispense: 14 tablet; Refill: 0 - saccharomyces boulardii (FLORASTOR) 250 MG capsule; Take 1 capsule (250 mg total) by mouth 2 (two) times daily for 10 days.  Dispense: 20 capsule; Refill: 0 - guaiFENesin-dextromethorphan (ROBITUSSIN DM) 100-10 MG/5ML syrup; Take 5 mLs by mouth every 4 (four) hours as needed for cough.  Dispense: 118 mL; Refill: 0 - Advised to notify provider's office for worsening symptoms or if fail to resolve.   Family/ staff Communication: Reviewed plan of care with patient and Daughter Aldona Bar verbalized understanding   Labs/tests ordered: None   Next Appointment: 6 months follow up 03/13/2020 with PCP Dani Gobble   Spent 15 minutes of non-face to face with patient and daughter.  I connected with  Bernadette Hoit on 02/24/20 by a video enabled telemedicine application and verified that I am speaking with the correct person using two identifiers.   I discussed the limitations of evaluation and management by telemedicine. The patient expressed understanding and agreed to proceed.  Sandrea Hughs, NP

## 2020-02-24 NOTE — Patient Instructions (Signed)
-   Take doxycycline (VIBRA-TABS) 100 MG tablet; Take 1 tablet (100 mg total) by mouth 2 (two) times daily for 7 days.  Dispense: 14 tablet; Refill: 0 - Take Probiotic saccharomyces boulardii (FLORASTOR) 250 MG capsule; Take 1 capsule (250 mg total) by mouth 2 (two) times daily for 10 days.  Dispense: 20 capsule; Refill: 0 - Take cough syrup guaiFENesin-dextromethorphan (ROBITUSSIN DM) 100-10 MG/5ML syrup; Take 5 mLs by mouth every 4 (four) hours as needed for cough.  Dispense: 118 mL; Refill: 0 - Notify provider's office or go to ED if symptoms worsen or fail to resolve.

## 2020-02-26 ENCOUNTER — Ambulatory Visit: Payer: Medicare HMO | Admitting: Nurse Practitioner

## 2020-03-13 ENCOUNTER — Other Ambulatory Visit: Payer: Self-pay

## 2020-03-13 ENCOUNTER — Encounter: Payer: Self-pay | Admitting: Nurse Practitioner

## 2020-03-13 ENCOUNTER — Ambulatory Visit (INDEPENDENT_AMBULATORY_CARE_PROVIDER_SITE_OTHER): Payer: Medicare HMO | Admitting: Nurse Practitioner

## 2020-03-13 VITALS — BP 110/70 | HR 63 | Temp 97.4°F | Ht 61.0 in | Wt 169.0 lb

## 2020-03-13 DIAGNOSIS — E7800 Pure hypercholesterolemia, unspecified: Secondary | ICD-10-CM

## 2020-03-13 DIAGNOSIS — G2581 Restless legs syndrome: Secondary | ICD-10-CM

## 2020-03-13 DIAGNOSIS — N390 Urinary tract infection, site not specified: Secondary | ICD-10-CM

## 2020-03-13 DIAGNOSIS — M542 Cervicalgia: Secondary | ICD-10-CM

## 2020-03-13 DIAGNOSIS — K5909 Other constipation: Secondary | ICD-10-CM | POA: Diagnosis not present

## 2020-03-13 DIAGNOSIS — H6123 Impacted cerumen, bilateral: Secondary | ICD-10-CM

## 2020-03-13 DIAGNOSIS — N952 Postmenopausal atrophic vaginitis: Secondary | ICD-10-CM | POA: Diagnosis not present

## 2020-03-13 DIAGNOSIS — M159 Polyosteoarthritis, unspecified: Secondary | ICD-10-CM

## 2020-03-13 DIAGNOSIS — E038 Other specified hypothyroidism: Secondary | ICD-10-CM

## 2020-03-13 DIAGNOSIS — M8949 Other hypertrophic osteoarthropathy, multiple sites: Secondary | ICD-10-CM
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
## 2020-03-13 NOTE — Patient Instructions (Addendum)
To make appt next week for fasting labs  To make appt for virtual visit to complete MOST form when your daughter is available to help with the smart phone  Follow up in 6 months for routine follow up

## 2020-03-17 ENCOUNTER — Other Ambulatory Visit: Payer: Medicare HMO

## 2020-03-17 ENCOUNTER — Other Ambulatory Visit: Payer: Self-pay

## 2020-03-17 ENCOUNTER — Telehealth: Payer: Self-pay

## 2020-03-17 DIAGNOSIS — E7800 Pure hypercholesterolemia, unspecified: Secondary | ICD-10-CM

## 2020-03-17 LAB — LIPID PANEL
Cholesterol: 226 mg/dL — ABNORMAL HIGH (ref <200)
HDL: 102 mg/dL (ref >=50)
LDL Cholesterol (Calc): 110 mg/dL (calc) — ABNORMAL HIGH
Non-HDL Cholesterol (Calc): 124 mg/dL (calc) (ref <130)
Total CHOL/HDL Ratio: 2.2 (calc) (ref <5.0)
Triglycerides: 55 mg/dL (ref <150)

## 2020-03-17 LAB — COMPLETE METABOLIC PANEL WITH GFR
AG Ratio: 1.6 (calc) (ref 1.0–2.5)
ALT: 17 U/L (ref 6–29)
AST: 25 U/L (ref 10–35)
Albumin: 3.9 g/dL (ref 3.6–5.1)
Alkaline phosphatase (APISO): 98 U/L (ref 37–153)
BUN/Creatinine Ratio: 14 (calc) (ref 6–22)
BUN: 14 mg/dL (ref 7–25)
CO2: 28 mmol/L (ref 20–32)
Calcium: 9.7 mg/dL (ref 8.6–10.4)
Chloride: 108 mmol/L (ref 98–110)
Creat: 1.03 mg/dL — ABNORMAL HIGH (ref 0.60–0.93)
GFR, Est African American: 62 mL/min/{1.73_m2} (ref >=60)
GFR, Est Non African American: 53 mL/min/{1.73_m2} — ABNORMAL LOW (ref >=60)
Globulin: 2.4 g/dL (calc) (ref 1.9–3.7)
Glucose, Bld: 98 mg/dL (ref 65–99)
Potassium: 3.9 mmol/L (ref 3.5–5.3)
Sodium: 144 mmol/L (ref 135–146)
Total Bilirubin: 0.5 mg/dL (ref 0.2–1.2)
Total Protein: 6.3 g/dL (ref 6.1–8.1)

## 2020-03-17 NOTE — Telephone Encounter (Signed)
Ms. Vesna came for her labs this morning and turned in her MOST form to be signed by Janett Billow. I placed the form in [place] so she can sign it when she's  come back in the office

## 2020-03-17 NOTE — Telephone Encounter (Signed)
This has to be a in person visit, we would have to discuss this together and she has to sign the document in the system.

## 2020-03-17 NOTE — Telephone Encounter (Signed)
She stated she is confused and that's why she came up here today with the form. She will be in on 04/10/20 to sign the most form @9 :20 am

## 2020-03-23 ENCOUNTER — Other Ambulatory Visit: Payer: Self-pay | Admitting: Nurse Practitioner

## 2020-03-23 DIAGNOSIS — Z1231 Encounter for screening mammogram for malignant neoplasm of breast: Secondary | ICD-10-CM

## 2020-04-10 ENCOUNTER — Ambulatory Visit (INDEPENDENT_AMBULATORY_CARE_PROVIDER_SITE_OTHER): Payer: Medicare HMO | Admitting: Nurse Practitioner

## 2020-04-10 ENCOUNTER — Other Ambulatory Visit: Payer: Self-pay

## 2020-04-10 ENCOUNTER — Encounter: Payer: Self-pay | Admitting: Nurse Practitioner

## 2020-04-10 VITALS — BP 132/78 | HR 55 | Temp 96.8°F | Ht 61.0 in | Wt 175.6 lb

## 2020-04-10 DIAGNOSIS — M8949 Other hypertrophic osteoarthropathy, multiple sites: Secondary | ICD-10-CM | POA: Diagnosis not present

## 2020-04-10 DIAGNOSIS — Z7189 Other specified counseling: Secondary | ICD-10-CM

## 2020-04-10 DIAGNOSIS — M159 Polyosteoarthritis, unspecified: Secondary | ICD-10-CM

## 2020-04-10 NOTE — Progress Notes (Signed)
[name]: Patient Care Team: [name], NP as PCP - General (Nurse Practitioner)  PLACE OF SERVICE:  [place] Directive information Does Patient Have a Medical Advance Directive?: Yes, Type of Advance Directive: [place], Does patient want to make changes to medical advance directive?: No - Patient declined  No Known Allergies  Chief Complaint  Patient presents with  . Advanced Directive    Advance care planning, discuss MOST form .      HPI: Patient is a 74 y.o. female for MOST form completion. Reports her daughter filled out the copy and she signed it.  Daughter request full scope of treatment until she can decide anything otherwise. Pt is in agreement with this.   Cr was slightly worse on last labs. Recommended to cut back to as needed on mobic, she has not needed since she was notified ~3 weeks ago.   OA- stable, no increase in pain since taking mobic.   Review of Systems:  Review of Systems  Constitutional: Negative for chills and fever.  Respiratory: Negative for shortness of breath.   Cardiovascular: Negative for chest pain.  Musculoskeletal: Positive for joint pain. Negative for myalgias.  Skin: Negative for itching and rash.  Neurological: Negative for dizziness and headaches.    Past Medical History:  Diagnosis Date  . Allergic rhinitis   . Anxiety   . Arthritis   . Colon polyps    adenomatous  . Corns and callosities   . Diverticulosis   . GERD (gastroesophageal reflux disease)   . Glaucoma   . Hypercholesteremia    no meds  . Hypothyroidism   . Obesity   . Screening for diabetes mellitus   . Urge incontinence    Past Surgical History:  Procedure Laterality Date  . ABDOMINAL HYSTERECTOMY  1985   Dr Mancel Bale  . BUNIONECTOMY Right    Dr Jomarie Longs  . CARPAL TUNNEL RELEASE Bilateral 2010   Dr Laurance Flatten  . [place]   1 time  . COLONOSCOPY    . ROTATOR CUFF REPAIR Right    Dr Theda Sers  .  THYROIDECTOMY  1987   Dr Guadlupe Spanish  . TONSILLECTOMY  1951   Social History:   reports that she quit smoking about 33 years ago. Her smoking use included cigarettes. She has never used smokeless tobacco. She reports that she does not drink alcohol and does not use drugs.  Family History  Problem Relation Age of Onset  . Leukemia Mother 52  . Lung cancer Father   . Diabetes Brother   . COPD Brother   . Lung cancer Brother   . Diabetes Maternal Grandmother   . Cancer Maternal Grandfather        unknown type  . Breast cancer Maternal Aunt   . Colon polyps Neg Hx   . Rectal cancer Neg Hx     Medications: Patient's Medications  New Prescriptions   No medications on file  Previous Medications   ACETAMINOPHEN (TYLENOL) 500 MG TABLET    Take 1,000 mg by mouth every 6 (six) hours as needed for mild pain.   ASCORBIC ACID (VITAMIN C PO)    Take 1 tablet by mouth daily.   ASPIRIN 81 MG TABLET    Take 81 mg by mouth daily.   CALCIUM-VITAMIN D PO    Take 2 tablets by mouth daily.    CRANBERRY PO    Take 1 tablet by mouth daily.   DICLOFENAC  SODIUM (VOLTAREN) 1 % GEL    Apply 2 g topically 2 (two) times daily as needed.   GUAIFENESIN-DEXTROMETHORPHAN (ROBITUSSIN DM) 100-10 MG/5ML SYRUP    Take 5 mLs by mouth every 4 (four) hours as needed for cough.   HYDROXYPROPYL METHYLCELLULOSE / HYPROMELLOSE (ISOPTO TEARS / GONIOVISC) 2.5 % OPHTHALMIC SOLUTION    Place 1 drop into both eyes 4 (four) times daily as needed for dry eyes.   LEVOTHYROXINE (SYNTHROID) 50 MCG TABLET    Take 1 tablet (50 mcg total) by mouth daily.   LINZESS 145 MCG CAPS CAPSULE    TAKE 1 CAPSULE(145 MCG) BY MOUTH DAILY BEFORE BREAKFAST   MELOXICAM (MOBIC) 7.5 MG TABLET    Take 7.5 mg by mouth daily as needed for pain.   MULTIPLE VITAMIN (MULTIVITAMIN WITH MINERALS) TABS    Take 1 tablet by mouth daily.   OMEPRAZOLE (PRILOSEC) 40 MG CAPSULE    TAKE 1 CAPSULE(40 MG) BY MOUTH DAILY   ROPINIROLE (REQUIP) 0.25 MG TABLET    TAKE 1 TABLET  BY MOUTH EVERY DAY AFTER SUPPER. TAKE 1-2 HRS BEFORE GOING TO BEDTIME   TIZANIDINE (ZANAFLEX) 2 MG CAPSULE    Take 1 capsule (2 mg total) by mouth 3 (three) times daily as needed for muscle spasms.   TRAMADOL (ULTRAM) 50 MG TABLET    Take one tablet by mouth every 6 hours as needed for pain  Modified Medications   No medications on file  Discontinued Medications   MELOXICAM (MOBIC) 7.5 MG TABLET    TAKE 1 TABLET(7.5 MG) BY MOUTH DAILY    Physical Exam:  Vitals:   04/10/20 1109  Pulse: (!) 55  Temp: (!) 96.8 F (36 C)  TempSrc: Temporal  SpO2: 95%  Weight: 175 lb 9.6 oz (79.7 kg)  Height: 5\' 1"  (1.549 m)   Body mass index is 33.18 kg/m. Wt Readings from Last 3 Encounters:  04/10/20 175 lb 9.6 oz (79.7 kg)  03/13/20 169 lb (76.7 kg)  02/24/20 173 lb (78.5 kg)    Physical Exam Constitutional:      Appearance: Normal appearance.  HENT:     Head: Normocephalic and atraumatic.  Musculoskeletal:        General: No tenderness.  Skin:    General: Skin is warm and dry.  Neurological:     General: No focal deficit present.     Mental Status: She is alert and oriented to person, place, and time. Mental status is at baseline.  Psychiatric:        Mood and Affect: Mood normal.        Behavior: Behavior normal.     Labs reviewed: Basic Metabolic Panel: Recent Labs    06/13/19 0950 08/28/19 1012 03/17/20 0814  NA  --  143 144  K  --  3.9 3.9  CL  --  106 108  CO2  --  28 28  GLUCOSE  --  98 98  BUN  --  20 14  CREATININE 0.90 0.96* 1.03*  CALCIUM  --  10.1 9.7  TSH  --  0.53  --    Liver Function Tests: Recent Labs    03/17/20 0814  AST 25  ALT 17  BILITOT 0.5  PROT 6.3   No results for input(s): LIPASE, AMYLASE in the last 8760 hours. No results for input(s): AMMONIA in the last 8760 hours. CBC: Recent Labs    08/28/19 1012  WBC 5.5  NEUTROABS 2,921  HGB 12.7  HCT 39.7  MCV 95.9  PLT 252   Lipid Panel: Recent Labs    08/28/19 1012  03/17/20 0814  CHOL 262* 226*  HDL 95 102  LDLCALC 150* 110*  TRIG 73 55  CHOLHDL 2.8 2.2   TSH: Recent Labs    08/28/19 1012  TSH 0.53   A1C: Lab Results  Component Value Date   HGBA1C 5.6 07/05/2014     Assessment/Plan 1. Primary osteoarthritis involving multiple joints Stable, has not needed mobic ~3 weeks. Encouraged use of tylenol OTC  if needed oral medication for pain   2. Advanced care planning/counseling discussion -MOST form reviewed and discussed with pt and her daughter (via telephone) -completed MOST, printed pink copy for pt  Next appt: 09/14/2020 As scheduled  Janelli Welling K. [place], [facility] [phone]

## 2020-04-19 ENCOUNTER — Other Ambulatory Visit: Payer: Self-pay | Admitting: Nurse Practitioner

## 2020-04-19 DIAGNOSIS — E039 Hypothyroidism, unspecified: Secondary | ICD-10-CM

## 2020-04-28 ENCOUNTER — Other Ambulatory Visit: Payer: Self-pay | Admitting: Nurse Practitioner

## 2020-05-05 ENCOUNTER — Other Ambulatory Visit: Payer: Self-pay

## 2020-05-05 ENCOUNTER — Ambulatory Visit
Admission: RE | Admit: 2020-05-05 | Discharge: 2020-05-05 | Disposition: A | Discharge status: Home / Self Care | Payer: Medicare HMO | Source: Ambulatory Visit | Attending: Nurse Practitioner | Admitting: Nurse Practitioner

## 2020-05-05 DIAGNOSIS — Z1231 Encounter for screening mammogram for malignant neoplasm of breast: Secondary | ICD-10-CM

## 2020-05-09 ENCOUNTER — Other Ambulatory Visit: Payer: Self-pay | Admitting: Internal Medicine

## 2020-06-08 ENCOUNTER — Other Ambulatory Visit: Payer: Self-pay | Admitting: Nurse Practitioner

## 2020-07-13 ENCOUNTER — Other Ambulatory Visit: Payer: Self-pay

## 2020-07-13 DIAGNOSIS — M542 Cervicalgia: Secondary | ICD-10-CM

## 2020-07-13 DIAGNOSIS — M159 Polyosteoarthritis, unspecified: Secondary | ICD-10-CM

## 2020-07-13 DIAGNOSIS — S134XXD Sprain of ligaments of cervical spine, subsequent encounter: Secondary | ICD-10-CM

## 2020-07-13 DIAGNOSIS — G8929 Other chronic pain: Secondary | ICD-10-CM

## 2020-07-13 MED ORDER — TRAMADOL HCL 50 MG PO TABS: ORAL_TABLET | ORAL | 0 refills | Status: DC

## 2020-07-13 NOTE — Telephone Encounter (Signed)
Patient called requesting a refill on Tramadol. She stated she had changed pharmacies. I verified pharmacy.   #30 given on 01/01/20.  Last OV 04/10/20.

## 2020-07-17 ENCOUNTER — Other Ambulatory Visit: Payer: Self-pay | Admitting: Nurse Practitioner

## 2020-07-29 ENCOUNTER — Other Ambulatory Visit: Payer: Self-pay | Admitting: Nurse Practitioner

## 2020-07-29 DIAGNOSIS — S134XXD Sprain of ligaments of cervical spine, subsequent encounter: Secondary | ICD-10-CM

## 2020-07-29 DIAGNOSIS — M542 Cervicalgia: Secondary | ICD-10-CM

## 2020-08-01 ENCOUNTER — Other Ambulatory Visit: Payer: Self-pay | Admitting: Nurse Practitioner

## 2020-08-17 ENCOUNTER — Telehealth: Payer: Self-pay

## 2020-08-17 NOTE — Telephone Encounter (Signed)
Yes she can stop her ASA

## 2020-08-17 NOTE — Telephone Encounter (Signed)
Called patient and let her know that she could stop the aspirin.

## 2020-08-17 NOTE — Telephone Encounter (Signed)
Patient had seen on TV that there may not be a need to take baby aspirin and that they recommend consulting with your provider.  She is currently on 81 mg aspirin and is wanting to know if she should continue to take it.

## 2020-08-30 ENCOUNTER — Other Ambulatory Visit: Payer: Self-pay | Admitting: Internal Medicine

## 2020-09-02 ENCOUNTER — Other Ambulatory Visit: Payer: Self-pay | Admitting: Nurse Practitioner

## 2020-09-02 DIAGNOSIS — E039 Hypothyroidism, unspecified: Secondary | ICD-10-CM

## 2020-09-14 ENCOUNTER — Ambulatory Visit (INDEPENDENT_AMBULATORY_CARE_PROVIDER_SITE_OTHER): Payer: Medicare HMO | Admitting: Nurse Practitioner

## 2020-09-14 ENCOUNTER — Encounter: Payer: Self-pay | Admitting: Nurse Practitioner

## 2020-09-14 ENCOUNTER — Other Ambulatory Visit: Payer: Self-pay

## 2020-09-14 VITALS — BP 134/70 | HR 61 | Temp 96.9°F | Ht 61.0 in | Wt 177.0 lb

## 2020-09-14 DIAGNOSIS — M542 Cervicalgia: Secondary | ICD-10-CM

## 2020-09-14 DIAGNOSIS — G2581 Restless legs syndrome: Secondary | ICD-10-CM

## 2020-09-14 DIAGNOSIS — S134XXD Sprain of ligaments of cervical spine, subsequent encounter: Secondary | ICD-10-CM

## 2020-09-14 DIAGNOSIS — E038 Other specified hypothyroidism: Secondary | ICD-10-CM

## 2020-09-14 DIAGNOSIS — M159 Polyosteoarthritis, unspecified: Secondary | ICD-10-CM

## 2020-09-14 DIAGNOSIS — G8929 Other chronic pain: Secondary | ICD-10-CM

## 2020-09-14 DIAGNOSIS — M150 Primary generalized (osteo)arthritis: Secondary | ICD-10-CM

## 2020-09-14 DIAGNOSIS — M545 Low back pain, unspecified: Secondary | ICD-10-CM

## 2020-09-14 DIAGNOSIS — M8949 Other hypertrophic osteoarthropathy, multiple sites: Secondary | ICD-10-CM | POA: Diagnosis not present

## 2020-09-14 DIAGNOSIS — E7800 Pure hypercholesterolemia, unspecified: Secondary | ICD-10-CM | POA: Diagnosis not present

## 2020-09-14 DIAGNOSIS — K219 Gastro-esophageal reflux disease without esophagitis: Secondary | ICD-10-CM

## 2020-09-14 DIAGNOSIS — I10 Essential (primary) hypertension: Secondary | ICD-10-CM

## 2020-09-14 MED ORDER — DICLOFENAC SODIUM 1 % EX GEL: 2.0000 g | Freq: Four times a day (QID) | CUTANEOUS | 1 refills | Status: DC | PRN

## 2020-09-14 MED ORDER — TRAMADOL HCL 50 MG PO TABS: ORAL_TABLET | ORAL | 0 refills | Status: DC

## 2020-09-14 NOTE — Patient Instructions (Signed)
Can hold mobic- if no changes in pain would use as needed  Continue to use heat to lower back- 20-30 mins, can use muscle rub after heat To use TENS unit as directed Encouraged PT- if you decide you want let us know we can order for low back pain  Max tylenol is 3000 mg in 24 hours.
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
## 2020-09-15 ENCOUNTER — Telehealth: Payer: Self-pay

## 2020-09-15 LAB — CBC WITH DIFFERENTIAL/PLATELET
Absolute Monocytes: 266 cells/uL (ref 200–950)
Basophils Absolute: 40 cells/uL (ref 0–200)
Basophils Relative: 1.1 %
Eosinophils Absolute: 29 cells/uL (ref 15–500)
Eosinophils Relative: 0.8 %
HCT: 37.7 % (ref 35.0–45.0)
Hemoglobin: 12.1 g/dL (ref 11.7–15.5)
Lymphs Abs: 1757 cells/uL (ref 850–3900)
MCH: 30.3 pg (ref 27.0–33.0)
MCHC: 32.1 g/dL (ref 32.0–36.0)
MCV: 94.5 fL (ref 80.0–100.0)
MPV: 12.9 fL — ABNORMAL HIGH (ref 7.5–12.5)
Monocytes Relative: 7.4 %
Neutro Abs: 1508 cells/uL (ref 1500–7800)
Neutrophils Relative %: 41.9 %
Platelets: 243 10*3/uL (ref 140–400)
RBC: 3.99 10*6/uL (ref 3.80–5.10)
RDW: 11.7 % (ref 11.0–15.0)
Total Lymphocyte: 48.8 %
WBC: 3.6 10*3/uL — ABNORMAL LOW (ref 3.8–10.8)

## 2020-09-15 LAB — COMPLETE METABOLIC PANEL WITH GFR
AG Ratio: 1.6 (calc) (ref 1.0–2.5)
ALT: 11 U/L (ref 6–29)
AST: 25 U/L (ref 10–35)
Albumin: 4.1 g/dL (ref 3.6–5.1)
Alkaline phosphatase (APISO): 109 U/L (ref 37–153)
BUN: 13 mg/dL (ref 7–25)
CO2: 27 mmol/L (ref 20–32)
Calcium: 9.5 mg/dL (ref 8.6–10.4)
Chloride: 107 mmol/L (ref 98–110)
Creat: 0.87 mg/dL (ref 0.60–0.93)
GFR, Est African American: 75 mL/min/{1.73_m2} (ref >=60)
GFR, Est Non African American: 65 mL/min/{1.73_m2} (ref >=60)
Globulin: 2.6 g/dL (calc) (ref 1.9–3.7)
Glucose, Bld: 104 mg/dL — ABNORMAL HIGH (ref 65–99)
Potassium: 4.1 mmol/L (ref 3.5–5.3)
Sodium: 142 mmol/L (ref 135–146)
Total Bilirubin: 0.4 mg/dL (ref 0.2–1.2)
Total Protein: 6.7 g/dL (ref 6.1–8.1)

## 2020-09-15 LAB — TSH: TSH: 0.38 mIU/L — ABNORMAL LOW (ref 0.40–4.50)

## 2020-09-15 MED ORDER — LEVOTHYROXINE SODIUM 25 MCG PO TABS: 25.0000 ug | ORAL_TABLET | Freq: Every day | ORAL | 0 refills | Status: DC

## 2020-09-15 NOTE — Telephone Encounter (Signed)
Discussed results with patient, patient verbalized understanding of results  1.) Scheduled appointment to recheck labs on 11/11/2020  2.) Future order placed  3.) New rx sent to pharmacy on file, confirmed. Medication list updated

## 2020-09-15 NOTE — Telephone Encounter (Signed)
-----   Message from [name], NP sent at 09/15/2020  9:11 AM EST ----- Tsh is low, meaning too much thyroid medication, would recommend decreasing synthroid to 25 mcg by mouth daily. To follow up TSH in 8 weeks to make sure level has normalized.  Fasting glucose slightly elevated. Kidney function, blood counts, liver function electrolytes stable.

## 2020-09-16 ENCOUNTER — Ambulatory Visit (INDEPENDENT_AMBULATORY_CARE_PROVIDER_SITE_OTHER): Payer: Medicare HMO | Admitting: Nurse Practitioner

## 2020-09-16 ENCOUNTER — Encounter: Payer: Self-pay | Admitting: Nurse Practitioner

## 2020-09-16 ENCOUNTER — Other Ambulatory Visit: Payer: Self-pay

## 2020-09-16 VITALS — BP 128/82 | HR 57 | Temp 96.0°F | Ht 61.0 in | Wt 177.0 lb

## 2020-09-16 DIAGNOSIS — H6122 Impacted cerumen, left ear: Secondary | ICD-10-CM

## 2020-09-16 DIAGNOSIS — E038 Other specified hypothyroidism: Secondary | ICD-10-CM | POA: Diagnosis not present

## 2020-09-16 NOTE — Progress Notes (Signed)
[name]: Patient Care Team: Colleen Chandler, NP as PCP - General (Nurse Practitioner)  PLACE OF SERVICE:  [place] Directive information    No Known Allergies  No chief complaint on file.    HPI: Patient is a 74 y.o. female to follow up ear fullness Pt with cerumen impaction and came back today for ear lavage.  Denies pain or discharge.   She plans to go by pharmacy and pick up reduced dose of synthroid 25 mcg. Lab scheduled for 8 weeks.   She has been taking tramadol more routinely with better pain management.   Review of Systems:  Review of Systems  Constitutional: Negative for chills and fever.  HENT: Positive for hearing loss. Negative for ear discharge and ear pain.     Past Medical History:  Diagnosis Date  . Allergic rhinitis   . Anxiety   . Arthritis   . Colon polyps    adenomatous  . Corns and callosities   . Diverticulosis   . GERD (gastroesophageal reflux disease)   . Glaucoma   . Hypercholesteremia    no meds  . Hypothyroidism   . Obesity   . Screening for diabetes mellitus   . Urge incontinence    Past Surgical History:  Procedure Laterality Date  . ABDOMINAL HYSTERECTOMY  1985   Dr Mancel Bale  . BUNIONECTOMY Right    Dr Jomarie Longs  . CARPAL TUNNEL RELEASE Bilateral 2010   Dr Laurance Flatten  . Colleen [name]   1 time  . COLONOSCOPY    . ROTATOR CUFF REPAIR Right    Dr Theda Sers  . THYROIDECTOMY  1987   Dr Guadlupe Spanish  . TONSILLECTOMY  1951   Social History:   reports that she quit smoking about 33 years ago. Her smoking use included cigarettes. She has never used smokeless tobacco. She reports that she does not drink alcohol and does not use drugs.  Family History  Problem Relation Age of Onset  . Leukemia Mother 47  . Lung cancer Father   . Diabetes Brother   . COPD Brother   . Lung cancer Brother   . Diabetes Maternal Grandmother   . Cancer Maternal Grandfather        unknown type  . Breast cancer Maternal Aunt   .  Colon polyps Neg Hx   . Rectal cancer Neg Hx     Medications: Patient's Medications  New Prescriptions   No medications on file  Previous Medications   ACETAMINOPHEN (TYLENOL) 500 MG TABLET    Take 1,000 mg by mouth every 6 (six) hours as needed for mild pain.   ASCORBIC ACID (VITAMIN C PO)    Take 1 tablet by mouth daily.   CALCIUM-VITAMIN D PO    Take 2 tablets by mouth daily.    CRANBERRY PO    Take 1 tablet by mouth daily.   DICLOFENAC SODIUM (VOLTAREN) 1 % GEL    Apply 2 g topically 4 (four) times daily as needed.   HYDROXYPROPYL METHYLCELLULOSE / HYPROMELLOSE (ISOPTO TEARS / GONIOVISC) 2.5 % OPHTHALMIC SOLUTION    Place 1 drop into both eyes as needed for dry eyes.    LEVOTHYROXINE (SYNTHROID) 25 MCG TABLET    Take 1 tablet (25 mcg total) by mouth daily before breakfast.   LINACLOTIDE (LINZESS) 145 MCG CAPS CAPSULE    Take 1 capsule (145 mcg total) by mouth daily before breakfast. Office visit for further refills   MELOXICAM (MOBIC) 7.5 MG  TABLET    TAKE 1 TABLET(7.5 MG) BY MOUTH DAILY   MULTIPLE VITAMIN (MULTIVITAMIN WITH MINERALS) TABS    Take 1 tablet by mouth daily.   OMEPRAZOLE (PRILOSEC) 40 MG CAPSULE    TAKE 1 CAPSULE BY MOUTH EVERY DAY   ROPINIROLE (REQUIP) 0.25 MG TABLET    TAKE 1 TABLET BY MOUTH EVERY DAY AFTER SUPPER. TAKE 1-2 HRS BEFORE GOING TO BEDTIME   TRAMADOL (ULTRAM) 50 MG TABLET    Take one tablet by mouth every 6 hours as needed for pain  Modified Medications   No medications on file  Discontinued Medications   No medications on file    Physical Exam:  Vitals:   09/16/20 0923  BP: 128/82  Pulse: (!) 57  Temp: (!) 96 F (35.6 C)  TempSrc: Temporal  SpO2: 99%  Weight: 177 lb (80.3 kg)  Height: 5\' 1"  (1.549 m)   Body mass index is 33.44 kg/m. Wt Readings from Last 3 Encounters:  09/16/20 177 lb (80.3 kg)  09/14/20 177 lb (80.3 kg)  04/10/20 175 lb 9.6 oz (79.7 kg)    Physical Exam Constitutional:      Appearance: Normal appearance.  HENT:      Head: Normocephalic and atraumatic.     Right Ear: Tympanic membrane, ear canal and external ear normal. There is no impacted cerumen.     Left Ear: Ear canal and external ear normal. There is impacted cerumen.  Neurological:     Mental Status: She is alert and oriented to person, place, and time.  Psychiatric:        Mood and Affect: Mood normal.        Behavior: Behavior normal.     Labs reviewed: Basic Metabolic Panel: Recent Labs    03/17/20 0814 09/14/20 1102  NA 144 142  K 3.9 4.1  CL 108 107  CO2 28 27  GLUCOSE 98 104*  BUN 14 13  CREATININE 1.03* 0.87  CALCIUM 9.7 9.5  TSH  --  0.38*   Liver Function Tests: Recent Labs    03/17/20 0814 09/14/20 1102  AST 25 25  ALT 17 11  BILITOT 0.5 0.4  PROT 6.3 6.7   No results for input(s): LIPASE, AMYLASE in the last 8760 hours. No results for input(s): AMMONIA in the last 8760 hours. CBC: Recent Labs    09/14/20 1102  WBC 3.6*  NEUTROABS 1,508  HGB 12.1  HCT 37.7  MCV 94.5  PLT 243   Lipid Panel: Recent Labs    03/17/20 0814  CHOL 226*  HDL 102  LDLCALC 110*  TRIG 55  CHOLHDL 2.2   TSH: Recent Labs    09/14/20 1102  TSH 0.38*   A1C: Lab Results  Component Value Date   HGBA1C 5.6 07/05/2014     Assessment/Plan 1. Impacted cerumen of left ear Ear lavage completed and use grabber to remove, pt tolerated well and reports full feeling improved.   2. Other specified hypothyroidism To start synthroid 25 mcg due to low TSH at 0.38. Follow upTSH in 8 weeks.   Next appt:  11/05/2020 Colleen [name]. [place], [facility] [phone]

## 2020-09-26 ENCOUNTER — Other Ambulatory Visit: Payer: Self-pay | Admitting: Nurse Practitioner

## 2020-09-26 DIAGNOSIS — E039 Hypothyroidism, unspecified: Secondary | ICD-10-CM

## 2020-09-29 ENCOUNTER — Ambulatory Visit (INDEPENDENT_AMBULATORY_CARE_PROVIDER_SITE_OTHER): Payer: Medicare HMO | Admitting: Family

## 2020-09-29 ENCOUNTER — Encounter: Payer: Self-pay | Admitting: Family

## 2020-09-29 ENCOUNTER — Other Ambulatory Visit: Payer: Self-pay

## 2020-09-29 VITALS — BP 130/68 | HR 57 | Temp 97.3°F | Resp 16 | Ht 61.0 in | Wt 180.4 lb

## 2020-09-29 DIAGNOSIS — R600 Localized edema: Secondary | ICD-10-CM

## 2020-09-29 MED ORDER — HYDROCHLOROTHIAZIDE 12.5 MG PO TABS: 12.5000 mg | ORAL_TABLET | Freq: Every day | ORAL | 0 refills | Status: DC

## 2020-09-29 NOTE — Patient Instructions (Signed)
-   wear Knee high compression stockings on in the morning and off at bedtime for leg swelling  - check weight daily and record on log.Notify provider's office for any abrupt weight gain greater than 3 lbs per day ,cough  Or shortness of breath.

## 2020-09-29 NOTE — Progress Notes (Signed)
Provider: Zhanae Proffit FNP-C  [name], NP  Patient Care Team: [name], NP as PCP - General (Nurse Practitioner)  Extended Emergency Contact Information Primary Emergency Contact: [facility] Address: [address]          [place], [place] [address] [place] Phone: [phone] Relation: Daughter  Code Status:  Full Code  Goals of care: Advanced Directive information Advanced Directives 09/29/2020  Does Patient Have a Medical Advance Directive? Yes  Type of Paramedic of [place];Out of facility DNR (pink MOST or yellow form)  Does patient want to make changes to medical advance directive? No - Patient declined  Copy of [place] in Chart? Yes - validated most recent copy scanned in chart (See row information)  Would patient like information on creating a medical advance directive? -     Chief Complaint  Patient presents with  . Acute Visit    Swollen feet and hands x 39month    HPI:  Pt is a 74y.o. female seen today for an acute visit for evaluation of swollen feet and hands. States swelling has been going on for the past one month.Describes swelling as tightness on the leg and with some numbness sensation.hands also feels tight.she denies any cough,wheezing,fatigue or shortness of breath.sleeps on two pillows at night though has used two pillows for many, many years not new for her. Has no feeling of abdomen distention or feeling like pants are tight.States pants are actually loose.thinks she has lost weight.on chart review,has had a 3.4 lbs weight gain since seen by PCP JSherrie Mustache,NP on 09/16/2020.      Past Medical History:  Diagnosis Date  . Allergic rhinitis   . Anxiety   . Arthritis   . Colon polyps    adenomatous  . Corns and callosities   . Diverticulosis   . GERD (gastroesophageal reflux disease)   . Glaucoma   . Hypercholesteremia    no meds  . Hypothyroidism    . Obesity   . Screening for diabetes mellitus   . Urge incontinence    Past Surgical History:  Procedure Laterality Date  . ABDOMINAL HYSTERECTOMY  1985   Dr RMancel Bale . BUNIONECTOMY Right    Dr OJomarie Longs . CARPAL TUNNEL RELEASE Bilateral 2010   Dr MLaurance Flatten . [place]  1 time  . COLONOSCOPY    . ROTATOR CUFF REPAIR Right    Dr CTheda Sers . THYROIDECTOMY  1987   Dr HGuadlupe Spanish . TONSILLECTOMY  1951    No Known Allergies  Outpatient Encounter Medications as of 09/29/2020  Medication Sig  . acetaminophen (TYLENOL) 500 MG tablet Take 1,000 mg by mouth every 6 (six) hours as needed for mild pain.  . Ascorbic Acid (VITAMIN C PO) Take 1 tablet by mouth daily.  .Marland KitchenCALCIUM-VITAMIN D PO Take 2 tablets by mouth daily.   .Marland KitchenCRANBERRY PO Take 1 tablet by mouth daily.  . diclofenac Sodium (VOLTAREN) 1 % GEL Apply 2 g topically 4 (four) times daily as needed.  . hydroxypropyl methylcellulose / hypromellose (ISOPTO TEARS / GONIOVISC) 2.5 % ophthalmic solution Place 1 drop into both eyes as needed for dry eyes.   .Marland Kitchenlevothyroxine (SYNTHROID) 25 MCG tablet Take 1 tablet (25 mcg total) by mouth daily before breakfast.  . linaclotide (LINZESS) 145 MCG CAPS capsule Take 1 capsule (145 mcg total) by mouth daily before breakfast. Office visit for further refills  .  meloxicam (MOBIC) 7.5 MG tablet TAKE 1 TABLET(7.5 MG) BY MOUTH DAILY  . Multiple Vitamin (MULTIVITAMIN WITH MINERALS) TABS Take 1 tablet by mouth daily.  Marland Kitchen omeprazole (PRILOSEC) 40 MG capsule TAKE 1 CAPSULE BY MOUTH EVERY DAY  . rOPINIRole (REQUIP) 0.25 MG tablet TAKE 1 TABLET BY MOUTH EVERY DAY AFTER SUPPER. TAKE 1-2 HRS BEFORE GOING TO BEDTIME  . traMADol (ULTRAM) 50 MG tablet Take one tablet by mouth every 6 hours as needed for pain   No facility-administered encounter medications on file as of 09/29/2020.    Review of Systems  Constitutional: Negative for appetite change, chills, fatigue and fever.  HENT: Negative for  congestion, rhinorrhea, sinus pressure, sinus pain, sneezing, sore throat and trouble swallowing.   Respiratory: Negative for cough, chest tightness, shortness of breath and wheezing.   Cardiovascular: Positive for leg swelling. Negative for chest pain and palpitations.  Gastrointestinal: Negative for abdominal distention, abdominal pain, constipation, diarrhea, nausea and vomiting.  Genitourinary: Negative for difficulty urinating, dysuria, flank pain, frequency and urgency.  Musculoskeletal: Negative for gait problem and joint swelling.  Skin: Negative for color change, pallor and rash.  Neurological: Positive for numbness. Negative for dizziness, speech difficulty, weakness, light-headedness and headaches.  Psychiatric/Behavioral: Negative for agitation, confusion and sleep disturbance. The patient is not nervous/anxious.     Immunization History  Administered Date(s) Administered  . Influenza, High Dose Seasonal PF 08/26/2017, 08/06/2018, 07/28/2019, 08/13/2020  . Influenza,inj,Quad PF,6+ Mos 07/08/2014  . Influenza-Unspecified 08/09/2012, 08/01/2013, 06/30/2015, 08/04/2016, 07/31/2017  . PFIZER SARS-COV-2 Vaccination 11/19/2019, 12/10/2019, 07/22/2020  . Pneumococcal Conjugate-13 11/03/2014  . Pneumococcal Polysaccharide-23 02/19/2013, 08/20/2017  . Td 10/31/2000  . Tdap 10/22/2015  . Varicella 06/15/2011  . Zoster 10/31/2010  . Zoster Recombinat (Shingrix) 03/21/2017, 06/04/2017   Pertinent  Health Maintenance Due  Topic Date Due  . INFLUENZA VACCINE  Completed  . DEXA SCAN  Completed  . PNA vac Low Risk Adult  Completed   Fall Risk  09/29/2020 03/13/2020 02/24/2020 11/26/2019 11/04/2019  Falls in the past year? 0 1 1 0 0  Comment - - Misstep on curb - -  Number falls in past yr: 0 0 0 0 0  Injury with Fall? 0 0 1 0 0  Comment - - Bruising - -   Functional Status Survey:    Vitals:   09/29/20 1005  BP: 130/68  Pulse: (!) 57  Resp: 16  Temp: (!) 97.3 F (36.3 C)  SpO2:  97%  Weight: 180 lb 6.4 oz (81.8 kg)  Height: 5' 1"  (1.549 m)   Body mass index is 34.09 kg/m. Physical Exam Vitals reviewed.  Constitutional:      General: She is not in acute distress.    Appearance: She is not ill-appearing.  HENT:     Head: Normocephalic.     Mouth/Throat:     Mouth: Mucous membranes are moist.     Pharynx: Oropharynx is clear. No oropharyngeal exudate or posterior oropharyngeal erythema.  Eyes:     General: No scleral icterus.       Right eye: No discharge.        Left eye: No discharge.     Conjunctiva/sclera: Conjunctivae normal.     Pupils: Pupils are equal, round, and reactive to light.  Neck:     Vascular: No carotid bruit.  Cardiovascular:     Rate and Rhythm: Normal rate and regular rhythm.     Pulses: Normal pulses.     Heart sounds: Normal heart sounds. No  murmur heard.  No friction rub. No gallop.   Pulmonary:     Effort: Pulmonary effort is normal. No respiratory distress.     Breath sounds: Normal breath sounds. No wheezing, rhonchi or rales.  Chest:     Chest wall: No tenderness.  Abdominal:     General: Bowel sounds are normal. There is no distension.     Palpations: Abdomen is soft. There is no mass.     Tenderness: There is no abdominal tenderness. There is no right CVA tenderness, left CVA tenderness, guarding or rebound.  Musculoskeletal:        General: No swelling or tenderness. Normal range of motion.     Cervical back: Normal range of motion. No rigidity or tenderness.     Comments: Bilateral lower extremities 1-2 + edema   Lymphadenopathy:     Cervical: No cervical adenopathy.  Skin:    General: Skin is warm and dry.     Coloration: Skin is not pale.     Findings: No erythema, lesion or rash.  Neurological:     Mental Status: She is alert and oriented to person, place, and time.     Cranial Nerves: No cranial nerve deficit.     Sensory: No sensory deficit.     Motor: No weakness.     Coordination: Coordination normal.       Gait: Gait normal.  Psychiatric:        Mood and Affect: Mood normal.        Behavior: Behavior normal.        Thought Content: Thought content normal.        Judgment: Judgment normal.     Labs reviewed: Recent Labs    03/17/20 0814 09/14/20 1102  NA 144 142  K 3.9 4.1  CL 108 107  CO2 28 27  GLUCOSE 98 104*  BUN 14 13  CREATININE 1.03* 0.87  CALCIUM 9.7 9.5   Recent Labs    03/17/20 0814 09/14/20 1102  AST 25 25  ALT 17 11  BILITOT 0.5 0.4  PROT 6.3 6.7   Recent Labs    09/14/20 1102  WBC 3.6*  NEUTROABS 1,508  HGB 12.1  HCT 37.7  MCV 94.5  PLT 243   Lab Results  Component Value Date   TSH 0.38 (L) 09/14/2020   Lab Results  Component Value Date   HGBA1C 5.6 07/05/2014   Lab Results  Component Value Date   CHOL 226 (H) 03/17/2020   HDL 102 03/17/2020   LDLCALC 110 (H) 03/17/2020   TRIG 55 03/17/2020   CHOLHDL 2.2 03/17/2020    Significant Diagnostic Results in last 30 days:  No results found.  Assessment/Plan   Edema, lower extremity Bilateral 1-2 + edema.Lungs clear to auscultation bilateral.Has had a 3.4 lbs weight gain from previous visit  - hydrochlorothiazide (HYDRODIURIL) 12.5 MG tablet; Take 1 tablet (12.5 mg total) by mouth daily.  Dispense: 30 tablet; Refill: 0 - Compression stockings wear knee high in the morning and off at bedtime.  - BMP with eGFR([facility]); Future - Ambulatory referral to Cardiology per patient's daughter request.  Family/ staff Communication: Reviewed plan of care with patient  Labs/tests ordered: BMP in 2 weeks   Next Appointment: 2 weeks with Dani Gobble for lower extremities edema.  Sandrea Hughs, NP

## 2020-10-08 DIAGNOSIS — K635 Polyp of colon: Secondary | ICD-10-CM | POA: Insufficient documentation

## 2020-10-08 DIAGNOSIS — E7800 Pure hypercholesterolemia, unspecified: Secondary | ICD-10-CM | POA: Insufficient documentation

## 2020-10-08 DIAGNOSIS — H409 Unspecified glaucoma: Secondary | ICD-10-CM | POA: Insufficient documentation

## 2020-10-08 DIAGNOSIS — E669 Obesity, unspecified: Secondary | ICD-10-CM | POA: Insufficient documentation

## 2020-10-08 DIAGNOSIS — N3941 Urge incontinence: Secondary | ICD-10-CM | POA: Insufficient documentation

## 2020-10-08 DIAGNOSIS — F419 Anxiety disorder, unspecified: Secondary | ICD-10-CM | POA: Insufficient documentation

## 2020-10-08 DIAGNOSIS — Z131 Encounter for screening for diabetes mellitus: Secondary | ICD-10-CM | POA: Insufficient documentation

## 2020-10-08 DIAGNOSIS — L84 Corns and callosities: Secondary | ICD-10-CM | POA: Insufficient documentation

## 2020-10-08 DIAGNOSIS — J309 Allergic rhinitis, unspecified: Secondary | ICD-10-CM | POA: Insufficient documentation

## 2020-10-08 DIAGNOSIS — K219 Gastro-esophageal reflux disease without esophagitis: Secondary | ICD-10-CM | POA: Insufficient documentation

## 2020-10-08 DIAGNOSIS — K5790 Diverticulosis of intestine, part unspecified, without perforation or abscess without bleeding: Secondary | ICD-10-CM | POA: Insufficient documentation

## 2020-10-08 DIAGNOSIS — M1990 Unspecified osteoarthritis, unspecified site: Secondary | ICD-10-CM | POA: Insufficient documentation

## 2020-10-09 ENCOUNTER — Encounter: Payer: Self-pay | Admitting: Cardiology

## 2020-10-09 ENCOUNTER — Other Ambulatory Visit: Payer: Self-pay

## 2020-10-09 ENCOUNTER — Ambulatory Visit: Payer: Medicare HMO | Admitting: Cardiology

## 2020-10-09 DIAGNOSIS — R0609 Other forms of dyspnea: Secondary | ICD-10-CM

## 2020-10-09 DIAGNOSIS — R011 Cardiac murmur, unspecified: Secondary | ICD-10-CM

## 2020-10-09 DIAGNOSIS — R0600 Dyspnea, unspecified: Secondary | ICD-10-CM | POA: Diagnosis not present

## 2020-10-09 HISTORY — DX: Other forms of dyspnea: R06.09

## 2020-10-09 HISTORY — DX: Cardiac murmur, unspecified: R01.1

## 2020-10-09 NOTE — Progress Notes (Signed)
Cardiology Office Note:    Date:  10/09/2020   ID:  Colleen [name], DOB 19-Jul-1946, MRN [id number]  PCP:  [name], NP  Cardiologist:  Jenean Lindau, MD   Referring MD: Sandrea Hughs, NP    ASSESSMENT:    1. DOE (dyspnea on exertion)   2. Cardiac murmur    PLAN:    In order of problems listed above:  1. Primary prevention stressed with the patient.  Importance of compliance with diet medication stressed and she vocalized understanding. 2. Dyspnea on exertion: I discussed my findings with the patient at length.  In view of risk factors she will undergo Lexiscan sestamibi.  She is agreeable for this.  This will help [place] rule out objective evidence of coronary artery disease. 3. Cardiac murmur: Echocardiogram will be done lesion. 4. She will continue her hydrochlorothiazide as she is taking now.  I told her that if these tests are fine then she must begin a graded exercise program and she vocalized understanding.  She knows to go to the nearest emergency room for any concerning symptoms. 5. Patient will be seen in follow-up appointment in 2 months or earlier if the patient has any concerns    Medication Adjustments/Labs and Tests Ordered: Current medicines are reviewed at length with the patient today.  Concerns regarding medicines are outlined above.  No orders of the defined types were placed in this encounter.  No orders of the defined types were placed in this encounter.    No chief complaint on file.    History of Present Illness:    Colleen [name] is a 74 y.o. female.  Patient is a pleasant 74 year old female.  Patient has no significant past medical history.  She mentions to me that over the past 3 months she has been noticing some pedal edema.  This is mostly dependent.  Gets better in the morning.  No orthopnea or PND.  She is taking hydrochlorothiazide 12.5 mg half tablet every other day as prescribed by her primary care physician.  No chest pain  orthopnea or PND.  She has some shortness of breath on exertion.  Overall she leads a sedentary lifestyle.  At the time of my evaluation, the patient is alert awake oriented and in no distress.  Past Medical History:  Diagnosis Date  . Allergic rhinitis   . Anxiety   . Arthritis   . Atrophic vaginitis 06/25/2019  . Breast screening 10/26/2010   Last Assessment & Plan:  Formatting of this note might be different from the original. Will order a mammogram for this fall;  Follow up with me around Thanksgiving.  . Callus of heel 02/19/2013  . Colon polyps    adenomatous  . Constipation 08/20/2013  . Corns and callosities   . Diverticulosis   . Dysuria 06/03/2011   Last Assessment & Plan:  Formatting of this note might be different from the original. New, acute complaint today;  Will check urinalysis with her labs today;  . GERD (gastroesophageal reflux disease)   . Glaucoma   . H/O total hysterectomy 02/19/2013  . Hallux valgus 10/26/2010   Last Assessment & Plan:  Formatting of this note might be different from the original. Had surgery with Dr Jomarie Longs back in October;   Will have her follow up with him.  Marland Kitchen Heartburn 04/30/2014  . Helicobacter pylori gastritis 08/12/2014  . Hiatal hernia with gastroesophageal reflux 12/23/2014  . Hypercholesteremia    no meds  . Hypercholesterolemia 06/03/2011  Overview:  TC 205 (elevated) in 2010  Last Assessment & Plan:  Formatting of this note might be different from the original. Lab Results  Component Value Date   CHOL 205 12/09/2011   CHOL 233 06/03/2011   CHOL 205 05/26/2009   Lab Results  Component Value Date   HDL 82 12/09/2011   HDL 90 06/03/2011   HDL 75 05/26/2009   Lab Results  Component Value Date   LDLCALC 113 12/09/2011   [place] 133 06/03/2011   LDLCA  . Hypothyroidism   . Left-sided low back pain with left-sided sciatica 08/12/2014  . Neck pain 10/26/2010  . Obesity   . Osteoarthritis 10/26/2010  . Osteopenia 01/08/2014  . Overweight 10/26/2010  .  Postmenopausal estrogen deficiency 02/19/2013  . Recurrent UTI 06/25/2019  . Restless leg syndrome 08/20/2013  . RLS (restless legs syndrome) 07/08/2014  . Routine general medical examination at a health care facility 02/19/2013  . S/P carpal tunnel release 10/26/2010  . Screen for colon cancer 12/16/2011   Last Assessment & Plan:  Formatting of this note might be different from the original. Refer to GI;  . Screening for diabetes mellitus   . Shoulder pain, right 12/16/2011   Last Assessment & Plan:  Formatting of this note might be different from the original. We have tried physical therapy;   We shot and reviewed films today;  She has  1)  Osteopenia 2)  Small spur at humeral head 3)  Some hypertrophic/lipping changes at the inferior glenoid rim;   I also injected it last visit;  She is on NSAIDS and tylenol;  Will trial voltaren gel;  Then get ortho opinion;  . Skin spots-aging 07/08/2014  . Urge incontinence   . Whiplash injury 06/26/2018    Past Surgical History:  Procedure Laterality Date  . ABDOMINAL HYSTERECTOMY  1985   Dr Mancel Bale  . BUNIONECTOMY Right    Dr Jomarie Longs  . CARPAL TUNNEL RELEASE Bilateral 2010   Dr Laurance Flatten  . [place]   1 time  . COLONOSCOPY    . ROTATOR CUFF REPAIR Right    Dr Theda Sers  . THYROIDECTOMY  1987   Dr Guadlupe Spanish  . TONSILLECTOMY  1951    Current Medications: Current Meds  Medication Sig  . acetaminophen (TYLENOL) 500 MG tablet Take 1,000 mg by mouth every 6 (six) hours as needed for mild pain.  . Ascorbic Acid (VITAMIN C PO) Take 1 tablet by mouth daily.  Marland Kitchen CALCIUM-VITAMIN D PO Take 2 tablets by mouth daily.   Marland Kitchen CRANBERRY PO Take 1 tablet by mouth daily.  . diclofenac Sodium (VOLTAREN) 1 % GEL Apply 2 g topically 4 (four) times daily as needed.  . hydrochlorothiazide (HYDRODIURIL) 12.5 MG tablet Take 6.25 mg by mouth every other day.  . hydroxypropyl methylcellulose / hypromellose (ISOPTO TEARS / GONIOVISC) 2.5 % ophthalmic solution Place 1  drop into both eyes as needed for dry eyes.   Marland Kitchen levothyroxine (SYNTHROID) 25 MCG tablet Take 1 tablet (25 mcg total) by mouth daily before breakfast.  . linaclotide (LINZESS) 145 MCG CAPS capsule Take 1 capsule (145 mcg total) by mouth daily before breakfast. Office visit for further refills  . meloxicam (MOBIC) 7.5 MG tablet TAKE 1 TABLET(7.5 MG) BY MOUTH DAILY  . Multiple Vitamin (MULTIVITAMIN WITH MINERALS) TABS Take 1 tablet by mouth daily.  Marland Kitchen omeprazole (PRILOSEC) 40 MG capsule TAKE 1 CAPSULE BY MOUTH EVERY DAY  . rOPINIRole (REQUIP) 0.25 MG tablet TAKE 1  TABLET BY MOUTH EVERY DAY AFTER SUPPER. TAKE 1-2 HRS BEFORE GOING TO BEDTIME  . traMADol (ULTRAM) 50 MG tablet Take one tablet by mouth every 6 hours as needed for pain     Allergies:   Patient has no known allergies.   Social History   Socioeconomic History  . Marital status: Widowed    Spouse name: Not on file  . Number of children: 1  . Years of education: Not on file  . Highest education level: Not on file  Occupational History  . Occupation: retired  Tobacco Use  . Smoking status: Former Smoker    Types: Cigarettes    Quit date: 10/31/1986    Years since quitting: 33.9  . Smokeless tobacco: Never Used  Vaping Use  . Vaping Use: Never used  Substance and Sexual Activity  . Alcohol use: No    Alcohol/week: 0.0 standard drinks  . Drug use: No  . Sexual activity: Never  Other Topics Concern  . Not on file  Social History Narrative  . Not on file   Social Determinants of Health   Financial Resource Strain: Not on file  Food Insecurity: Not on file  Transportation Needs: Not on file  Physical Activity: Not on file  Stress: Not on file  Social Connections: Not on file     Family History: The patient's family history includes Breast cancer in her maternal aunt; COPD in her brother; Cancer in her maternal grandfather; Diabetes in her brother and maternal grandmother; Leukemia (age of onset: 37) in her mother; Lung  cancer in her brother and father. There is no history of Colon polyps or Rectal cancer.  ROS:   Please see the history of present illness.    All other systems reviewed and are negative.  EKGs/Labs/Other Studies Reviewed:    The following studies were reviewed today: Study Conclusions   - Left ventricle: The cavity size was normal. Systolic function was  normal. The estimated ejection fraction was in the range of 55%  to 60%. Wall motion was normal; there were no regional wall  motion abnormalities.  - Mitral valve: There was mild regurgitation.  - Tricuspid valve: There was moderate regurgitation.     Recent Labs: 09/14/2020: ALT 11; BUN 13; Creat 0.87; Hemoglobin 12.1; Platelets 243; Potassium 4.1; Sodium 142; TSH 0.38  Recent Lipid Panel    Component Value Date/Time   CHOL 226 (H) 03/17/2020 0814   CHOL 206 (H) 10/19/2015 0806   TRIG 55 03/17/2020 0814   HDL 102 03/17/2020 0814   HDL 91 10/19/2015 0806   CHOLHDL 2.2 03/17/2020 0814   VLDL 9 05/09/2017 0819   LDLCALC 110 (H) 03/17/2020 0814    Physical Exam:    VS:  BP 138/70   Pulse 70   Ht 5\' 1"  (1.549 m)   Wt 177 lb 0.6 oz (80.3 kg)   SpO2 99%   BMI 33.45 kg/m     Wt Readings from Last 3 Encounters:  10/09/20 177 lb 0.6 oz (80.3 kg)  09/29/20 180 lb 6.4 oz (81.8 kg)  09/16/20 177 lb (80.3 kg)     GEN: Patient is in no acute distress HEENT: Normal NECK: No JVD; No carotid bruits LYMPHATICS: No lymphadenopathy CARDIAC: Hear sounds regular, 2/6 systolic murmur at the apex. RESPIRATORY:  Clear to auscultation without rales, wheezing or rhonchi  ABDOMEN: Soft, non-tender, non-distended MUSCULOSKELETAL: Bilateral 1+ pitting pedal edema; No deformity  SKIN: Warm and dry NEUROLOGIC:  Alert and oriented x 3  PSYCHIATRIC:  Normal affect   Signed, Jenean Lindau, MD  10/09/2020 3:09 PM    [place]

## 2020-10-09 NOTE — Patient Instructions (Signed)
Medication Instructions:  No medication changes. *If you need a refill on your cardiac medications before your next appointment, please call your pharmacy*   Lab Work: None ordered If you have labs (blood work) drawn today and your tests are completely normal, you will receive your results only by: . MyChart Message (if you have MyChart) OR . A paper copy in the mail If you have any lab test that is abnormal or we need to change your treatment, we will call you to review the results.   Testing/Procedures: Your physician has requested that you have an echocardiogram. Echocardiography is a painless test that uses sound waves to create images of your heart. It provides your doctor with information about the size and shape of your heart and how well your heart's chambers and valves are working. This procedure takes approximately one hour. There are no restrictions for this procedure.  Your physician has requested that you have a lexiscan myoview. For further information please visit [email or web address]. Please follow instruction sheet, as given.  The test will take approximately 3 to 4 hours to complete; you may bring reading material.  If someone comes with you to your appointment, they will need to remain in the main lobby due to limited space in the testing area. **If you are pregnant or breastfeeding, please notify the nuclear lab prior to your appointment**  How to prepare for your Myocardial Perfusion Test: . Do not eat or drink 3 hours prior to your test, except you may have water. . Do not consume products containing caffeine (regular or decaffeinated) 12 hours prior to your test. (ex: coffee, chocolate, sodas, tea). . Do bring a list of your current medications with you.  If not listed below, you may take your medications as normal. . Do wear comfortable clothes (no dresses or overalls) and walking shoes, tennis shoes preferred (No heels or open toe shoes are allowed). . Do NOT wear  cologne, perfume, aftershave, or lotions (deodorant is allowed). . If these instructions are not followed, your test will have to be rescheduled.    Follow-Up: At [facility], you and your health needs are our priority.  As part of our continuing mission to provide you with exceptional heart care, we have created designated Provider Care Teams.  These Care Teams include your primary Cardiologist (physician) and Advanced Practice Providers (APPs -  Physician Assistants and Nurse Practitioners) who all work together to provide you with the care you need, when you need it.  We recommend signing up for the patient portal called "MyChart".  Sign up information is provided on this After Visit Summary.  MyChart is used to connect with patients for Virtual Visits (Telemedicine).  Patients are able to view lab/test results, encounter notes, upcoming appointments, etc.  Non-urgent messages can be sent to your provider as well.   To learn more about what you can do with MyChart, go to [email or web address].    Your next appointment:   6 month(s)  The format for your next appointment:   In Person  Provider:   Rajan Revankar, MD   Other Instructions  Cardiac Nuclear Scan  A cardiac nuclear scan is a test that is done to check the flow of blood to your heart. It is done when you are resting and when you are exercising. The test looks for problems such as:  Not enough blood reaching a portion of the heart.  The heart muscle not working as it should. You may need this test if:    You have heart disease.  You have had lab results that are not normal.  You have had heart surgery or a balloon procedure to open up blocked arteries (angioplasty).  You have chest pain.  You have shortness of breath. In this test, a special dye (tracer) is put into your bloodstream. The tracer will travel to your heart. A camera will then take pictures of your heart to see how the tracer moves through your  heart. This test is usually done at a hospital and takes 2-4 hours. Tell a doctor about:  Any allergies you have.  All medicines you are taking, including vitamins, herbs, eye drops, creams, and over-the-counter medicines.  Any problems you or family members have had with anesthetic medicines.  Any blood disorders you have.  Any surgeries you have had.  Any medical conditions you have.  Whether you are pregnant or may be pregnant. What are the risks? Generally, this is a safe test. However, problems may occur, such as:  Serious chest pain and heart attack. This is only a risk if the stress portion of the test is done.  Rapid heartbeat.  A feeling of warmth in your chest. This feeling usually does not last long.  Allergic reaction to the tracer. What happens before the test?  Ask your doctor about changing or stopping your normal medicines. This is important.  Follow instructions from your doctor about what you cannot eat or drink.  Remove your jewelry on the day of the test. What happens during the test? 1. An IV tube will be inserted into one of your veins. 2. Your doctor will give you a small amount of tracer through the IV tube. 3. You will wait for 20-40 minutes while the tracer moves through your bloodstream. 4. Your heart will be monitored with an electrocardiogram (ECG). 5. You will lie down on an exam table. 6. Pictures of your heart will be taken for about 15-20 minutes. 7. You may also have a stress test. For this test, one of these things may be done: ? You will be asked to exercise on a treadmill or a stationary bike. ? You will be given medicines that will make your heart work harder. This is done if you are unable to exercise. 8. When blood flow to your heart has peaked, a tracer will again be given through the IV tube. 9. After 20-40 minutes, you will get back on the exam table. More pictures will be taken of your heart. 10. Depending on the tracer that is  used, more pictures may need to be taken 3-4 hours later. 11. Your IV tube will be removed when the test is over. The test may vary among doctors and hospitals. What happens after the test? 1. Ask your doctor: ? Whether you can return to your normal schedule, including diet, activities, and medicines. ? Whether you should drink more fluids. This will help to remove the tracer from your body. Drink enough fluid to keep your pee (urine) pale yellow. 2. Ask your doctor, or the department that is doing the test: ? When will my results be ready? ? How will I get my results? Summary  A cardiac nuclear scan is a test that is done to check the flow of blood to your heart.  Tell your doctor whether you are pregnant or may be pregnant.  Before the test, ask your doctor about changing or stopping your normal medicines. This is important.  Ask your doctor whether you can return   to your normal activities. You may be asked to drink more fluids. This information is not intended to replace advice given to you by your health care provider. Make sure you discuss any questions you have with your health care provider. Document Revised: 02/06/2019 Document Reviewed: 04/02/2018 Elsevier Patient Education  2020 Elsevier Inc.  Echocardiogram An echocardiogram is a procedure that uses painless sound waves (ultrasound) to produce an image of the heart. Images from an echocardiogram can provide important information about:  Signs of coronary artery disease (CAD).  Aneurysm detection. An aneurysm is a weak or damaged part of an artery wall that bulges out from the normal force of blood pumping through the body.  Heart size and shape. Changes in the size or shape of the heart can be associated with certain conditions, including heart failure, aneurysm, and CAD.  Heart muscle function.  Heart valve function.  Signs of a past heart attack.  Fluid buildup around the heart.  Thickening of the heart  muscle.  A tumor or infectious growth around the heart valves. Tell a health care provider about:  Any allergies you have.  All medicines you are taking, including vitamins, herbs, eye drops, creams, and over-the-counter medicines.  Any blood disorders you have.  Any surgeries you have had.  Any medical conditions you have.  Whether you are pregnant or may be pregnant. What are the risks? Generally, this is a safe procedure. However, problems may occur, including:  Allergic reaction to dye (contrast) that may be used during the procedure. What happens before the procedure? No specific preparation is needed. You may eat and drink normally. What happens during the procedure?    An IV tube may be inserted into one of your veins.  You may receive contrast through this tube. A contrast is an injection that improves the quality of the pictures from your heart.  A gel will be applied to your chest.  A wand-like tool (transducer) will be moved over your chest. The gel will help to transmit the sound waves from the transducer.  The sound waves will harmlessly bounce off of your heart to allow the heart images to be captured in real-time motion. The images will be recorded on a computer. The procedure may vary among health care providers and hospitals. What happens after the procedure?  You may return to your normal, everyday life, including diet, activities, and medicines, unless your health care provider tells you not to do that. Summary  An echocardiogram is a procedure that uses painless sound waves (ultrasound) to produce an image of the heart.  Images from an echocardiogram can provide important information about the size and shape of your heart, heart muscle function, heart valve function, and fluid buildup around your heart.  You do not need to do anything to prepare before this procedure. You may eat and drink normally.  After the echocardiogram is completed, you may  return to your normal, everyday life, unless your health care provider tells you not to do that. This information is not intended to replace advice given to you by your health care provider. Make sure you discuss any questions you have with your health care provider. Document Revised: 02/07/2019 Document Reviewed: 11/19/2016 Elsevier Patient Education  2020 Elsevier Inc.  

## 2020-10-14 ENCOUNTER — Ambulatory Visit (INDEPENDENT_AMBULATORY_CARE_PROVIDER_SITE_OTHER): Payer: Medicare HMO | Admitting: Nurse Practitioner

## 2020-10-14 ENCOUNTER — Other Ambulatory Visit: Payer: Medicare HMO

## 2020-10-14 ENCOUNTER — Other Ambulatory Visit: Payer: Self-pay

## 2020-10-14 ENCOUNTER — Encounter: Payer: Self-pay | Admitting: Nurse Practitioner

## 2020-10-14 VITALS — BP 130/80 | HR 67 | Temp 97.1°F | Ht 61.0 in | Wt 174.4 lb

## 2020-10-14 DIAGNOSIS — R600 Localized edema: Secondary | ICD-10-CM

## 2020-10-14 NOTE — Progress Notes (Signed)
[name]: Patient Care Team: [name], NP as PCP - General (Nurse Practitioner)  PLACE OF SERVICE:  [place] Directive information    No Known Allergies  Chief Complaint  Patient presents with  . Follow-up    2 week follow up for leg swelling. Patient states leg swelling seem to be improving. Swelling in both legs. Patient states that last two toes on left foot turned dark.     HPI: Patient is a 74 y.o. female for follow up on LE edema.  She was referred to cardiology and plans to have echo and stress test which has been scheduled.  She was placed on HCTZ due to LE edema which improved swelling. When she saw cardiologist he recommended half tablet of HCTZ so she has reduced medication and taking 6.25 mg daily Reports she is also wearing compression hose during the day and that has helped.  No shortness of breath, no chest pains, no palpitations.  Reports good energy.  She has lost 6 lbs in 2 weeks. ? Fluid.   Review of Systems:  Review of Systems  Constitutional: Negative for chills, fever and weight loss.  Respiratory: Negative for cough, sputum production and shortness of breath.   Cardiovascular: Negative for chest pain, palpitations and leg swelling.  Skin: Negative.   Neurological: Negative for dizziness, tingling, weakness and headaches.    Past Medical History:  Diagnosis Date  . Allergic rhinitis   . Anxiety   . Arthritis   . Atrophic vaginitis 06/25/2019  . Breast screening 10/26/2010   Last Assessment & Plan:  Formatting of this note might be different from the original. Will order a mammogram for this fall;  Follow up with me around Thanksgiving.  . Callus of heel 02/19/2013  . Colon polyps    adenomatous  . Constipation 08/20/2013  . Corns and callosities   . Diverticulosis   . Dysuria 06/03/2011   Last Assessment & Plan:  Formatting of this note might be different from the original. New, acute complaint today;  Will check  urinalysis with her labs today;  . GERD (gastroesophageal reflux disease)   . Glaucoma   . H/O total hysterectomy 02/19/2013  . Hallux valgus 10/26/2010   Last Assessment & Plan:  Formatting of this note might be different from the original. Had surgery with Dr Jomarie Longs back in October;   Will have her follow up with him.  Marland Kitchen Heartburn 04/30/2014  . Helicobacter pylori gastritis 08/12/2014  . Hiatal hernia with gastroesophageal reflux 12/23/2014  . Hypercholesteremia    no meds  . Hypercholesterolemia 06/03/2011   Overview:  TC 205 (elevated) in 2010  Last Assessment & Plan:  Formatting of this note might be different from the original. Lab Results  Component Value Date   CHOL 205 12/09/2011   CHOL 233 06/03/2011   CHOL 205 05/26/2009   Lab Results  Component Value Date   HDL 82 12/09/2011   HDL 90 06/03/2011   HDL 75 05/26/2009   Lab Results  Component Value Date   LDLCALC 113 12/09/2011   [place] 133 06/03/2011   LDLCA  . Hypothyroidism   . Left-sided low back pain with left-sided sciatica 08/12/2014  . Neck pain 10/26/2010  . Obesity   . Osteoarthritis 10/26/2010  . Osteopenia 01/08/2014  . Overweight 10/26/2010  . Postmenopausal estrogen deficiency 02/19/2013  . Recurrent UTI 06/25/2019  . Restless leg syndrome 08/20/2013  . RLS (restless legs syndrome) 07/08/2014  . Routine general  medical examination at a health care facility 02/19/2013  . S/P carpal tunnel release 10/26/2010  . Screen for colon cancer 12/16/2011   Last Assessment & Plan:  Formatting of this note might be different from the original. Refer to GI;  . Screening for diabetes mellitus   . Shoulder pain, right 12/16/2011   Last Assessment & Plan:  Formatting of this note might be different from the original. We have tried physical therapy;   We shot and reviewed films today;  She has  1)  Osteopenia 2)  Small spur at humeral head 3)  Some hypertrophic/lipping changes at the inferior glenoid rim;   I also injected it last visit;  She is on NSAIDS  and tylenol;  Will trial voltaren gel;  Then get ortho opinion;  . Skin spots-aging 07/08/2014  . Urge incontinence   . Whiplash injury 06/26/2018   Past Surgical History:  Procedure Laterality Date  . ABDOMINAL HYSTERECTOMY  1985   Dr Mancel Bale  . BUNIONECTOMY Right    Dr Jomarie Longs  . CARPAL TUNNEL RELEASE Bilateral 2010   Dr Laurance Flatten  . [place]   1 time  . COLONOSCOPY    . ROTATOR CUFF REPAIR Right    Dr Theda Sers  . THYROIDECTOMY  1987   Dr Guadlupe Spanish  . TONSILLECTOMY  1951   Social History:   reports that she quit smoking about 33 years ago. Her smoking use included cigarettes. She has never used smokeless tobacco. She reports that she does not drink alcohol and does not use drugs.  Family History  Problem Relation Age of Onset  . Leukemia Mother 69  . Lung cancer Father   . Diabetes Brother   . COPD Brother   . Lung cancer Brother   . Diabetes Maternal Grandmother   . Cancer Maternal Grandfather        unknown type  . Breast cancer Maternal Aunt   . Colon polyps Neg Hx   . Rectal cancer Neg Hx     Medications: Patient's Medications  New Prescriptions   No medications on file  Previous Medications   ACETAMINOPHEN (TYLENOL) 500 MG TABLET    Take 1,000 mg by mouth every 6 (six) hours as needed for mild pain.   ASCORBIC ACID (VITAMIN C PO)    Take 1 tablet by mouth daily.   CALCIUM-VITAMIN D PO    Take 2 tablets by mouth daily.    CRANBERRY PO    Take 1 tablet by mouth daily.   DICLOFENAC SODIUM (VOLTAREN) 1 % GEL    Apply 2 g topically 4 (four) times daily as needed.   HYDROCHLOROTHIAZIDE (HYDRODIURIL) 12.5 MG TABLET    Take 6.25 mg by mouth every other day.   HYDROXYPROPYL METHYLCELLULOSE / HYPROMELLOSE (ISOPTO TEARS / GONIOVISC) 2.5 % OPHTHALMIC SOLUTION    Place 1 drop into both eyes as needed for dry eyes.    LEVOTHYROXINE (SYNTHROID) 25 MCG TABLET    Take 1 tablet (25 mcg total) by mouth daily before breakfast.   LINACLOTIDE (LINZESS) 145 MCG CAPS CAPSULE     Take 1 capsule (145 mcg total) by mouth daily before breakfast. Office visit for further refills   MELOXICAM (MOBIC) 7.5 MG TABLET    TAKE 1 TABLET(7.5 MG) BY MOUTH DAILY   MULTIPLE VITAMIN (MULTIVITAMIN WITH MINERALS) TABS    Take 1 tablet by mouth daily.   OMEPRAZOLE (PRILOSEC) 40 MG CAPSULE    TAKE 1 CAPSULE BY MOUTH EVERY DAY  ROPINIROLE (REQUIP) 0.25 MG TABLET    TAKE 1 TABLET BY MOUTH EVERY DAY AFTER SUPPER. TAKE 1-2 HRS BEFORE GOING TO BEDTIME   TRAMADOL (ULTRAM) 50 MG TABLET    Take one tablet by mouth every 6 hours as needed for pain  Modified Medications   No medications on file  Discontinued Medications   No medications on file    Physical Exam:  Vitals:   10/14/20 1133  BP: 130/80  Pulse: 67  Temp: (!) 97.1 F (36.2 C)  TempSrc: Temporal  SpO2: 99%  Weight: 174 lb 6.4 oz (79.1 kg)  Height: 5\' 1"  (1.549 m)   Body mass index is 32.95 kg/m. Wt Readings from Last 3 Encounters:  10/14/20 174 lb 6.4 oz (79.1 kg)  10/09/20 177 lb 0.6 oz (80.3 kg)  09/29/20 180 lb 6.4 oz (81.8 kg)    Physical Exam Constitutional:      General: She is not in acute distress.    Appearance: She is well-developed and well-nourished. She is not diaphoretic.  HENT:     Head: Normocephalic and atraumatic.     Mouth/Throat:     Mouth: Oropharynx is clear and moist.  Eyes:     Conjunctiva/sclera: Conjunctivae normal.     Pupils: Pupils are equal, round, and reactive to light.  Cardiovascular:     Rate and Rhythm: Normal rate and regular rhythm.     Heart sounds: Normal heart sounds.  Pulmonary:     Effort: Pulmonary effort is normal.     Breath sounds: Normal breath sounds.  Abdominal:     General: Bowel sounds are normal.     Palpations: Abdomen is soft.  Musculoskeletal:        General: No edema.     Cervical back: Normal range of motion and neck supple.     Right lower leg: No edema.     Left lower leg: No edema.  Skin:    General: Skin is warm and dry.  Neurological:      Mental Status: She is alert and oriented to person, place, and time.  Psychiatric:        Mood and Affect: Mood and affect and mood normal.        Behavior: Behavior normal.     Labs reviewed: Basic Metabolic Panel: Recent Labs    03/17/20 0814 09/14/20 1102  NA 144 142  K 3.9 4.1  CL 108 107  CO2 28 27  GLUCOSE 98 104*  BUN 14 13  CREATININE 1.03* 0.87  CALCIUM 9.7 9.5  TSH  --  0.38*   Liver Function Tests: Recent Labs    03/17/20 0814 09/14/20 1102  AST 25 25  ALT 17 11  BILITOT 0.5 0.4  PROT 6.3 6.7   No results for input(s): LIPASE, AMYLASE in the last 8760 hours. No results for input(s): AMMONIA in the last 8760 hours. CBC: Recent Labs    09/14/20 1102  WBC 3.6*  NEUTROABS 1,508  HGB 12.1  HCT 37.7  MCV 94.5  PLT 243   Lipid Panel: Recent Labs    03/17/20 0814  CHOL 226*  HDL 102  LDLCALC 110*  TRIG 55  CHOLHDL 2.2   TSH: Recent Labs    09/14/20 1102  TSH 0.38*   A1C: Lab Results  Component Value Date   HGBA1C 5.6 07/05/2014     Assessment/Plan 1. Edema, lower extremity Resolved at this time. Continues with compression hose, hctz 6.25 mg daily and elevation of lower  legs with low sodium diet.  - BASIC METABOLIC PANEL WITH GFR -to keep follow up with cardiologist for additional testing.   Next appt: 11/05/2020 Carlos American. [place], [facility] [phone]

## 2020-10-15 LAB — BASIC METABOLIC PANEL WITH GFR
BUN: 15 mg/dL (ref 7–25)
CO2: 29 mmol/L (ref 20–32)
Calcium: 9.4 mg/dL (ref 8.6–10.4)
Chloride: 107 mmol/L (ref 98–110)
Creat: 0.9 mg/dL (ref 0.60–0.93)
GFR, Est African American: 72 mL/min/{1.73_m2} (ref >=60)
GFR, Est Non African American: 62 mL/min/{1.73_m2} (ref >=60)
Glucose, Bld: 100 mg/dL (ref 65–139)
Potassium: 3.9 mmol/L (ref 3.5–5.3)
Sodium: 143 mmol/L (ref 135–146)

## 2020-10-21 ENCOUNTER — Other Ambulatory Visit: Payer: Self-pay | Admitting: Family

## 2020-10-21 DIAGNOSIS — R600 Localized edema: Secondary | ICD-10-CM

## 2020-10-21 NOTE — Telephone Encounter (Signed)
Patient is requesting refill on medication "Hydrochlorathiazide 12.5 mg" . Patient has Hydrochlorathiazide 6.25mg  on medication list. Medication routed to provider Marlowe Sax, NP to confirm if this is the correct dosage for medication due to PCP Dewaine Oats Carlos American, NP being out of office.

## 2020-10-27 IMAGING — MR MRI ABDOMEN WITH AND WITHOUT CONTRAST
12 of 24 series · 21 of 48 positions shown · IV contrast (agent unspecified)
Comparison: 7.5 mL Gadavist.

CLINICAL DATA: Pancreatic cystic lesion.  Surveillance MRI.

EXAM:
MRI ABDOMEN WITHOUT AND WITH CONTRAST
TECHNIQUE: Multiplanar multisequence MR imaging of the abdomen was performed
both before and after the administration of intravenous contrast.
CONTRAST:  MRI 02/21/2018

[Series 3: T2 fat-sat · axial · 5.0mm · 0.74mm/px · z∈[-58,+136]mm · 2 of 40 slices shown]
[im 1/40]
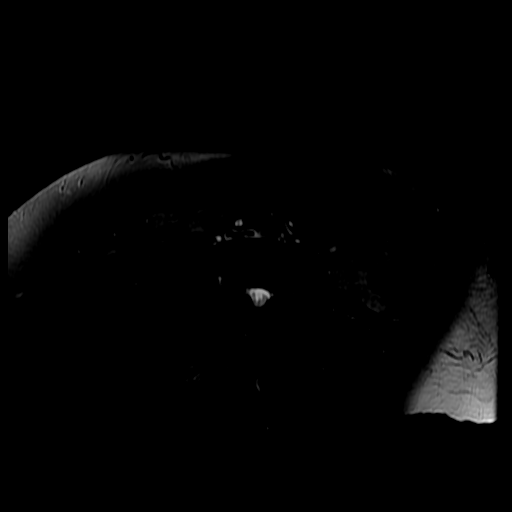
[im 40/40]
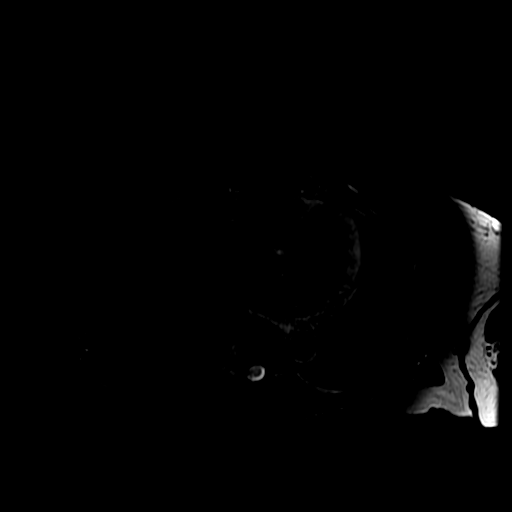

[Series 4: DWI b500 · axial · 6.0mm · 1.76mm/px · z∈[-144,+152]mm · 3 of 78 slices shown]
[im 1/78]
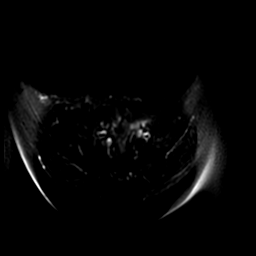
[im 39/78]
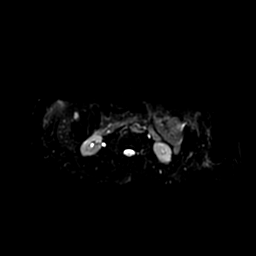
[im 78/78]
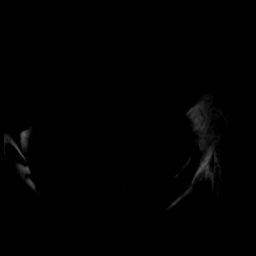

[Series 6: MRCP · coronal · 2.0mm · 0.70mm/px · 1 of 41 slices shown (1 of 2)]
[im 1/41]
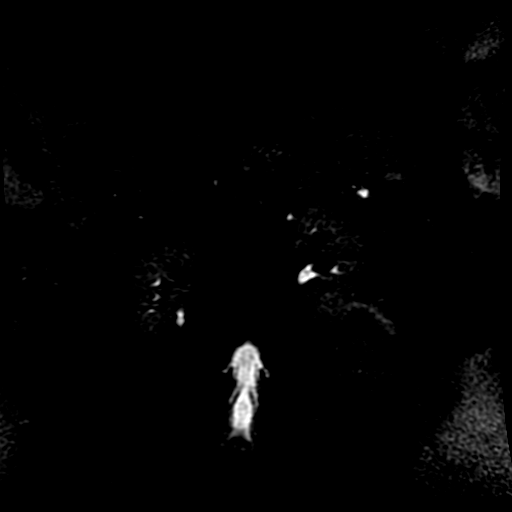

[Series 7: bSSFP fat-sat · coronal · 5.0mm · 0.78mm/px · 1 of 33 slices shown]
[im 1/33]
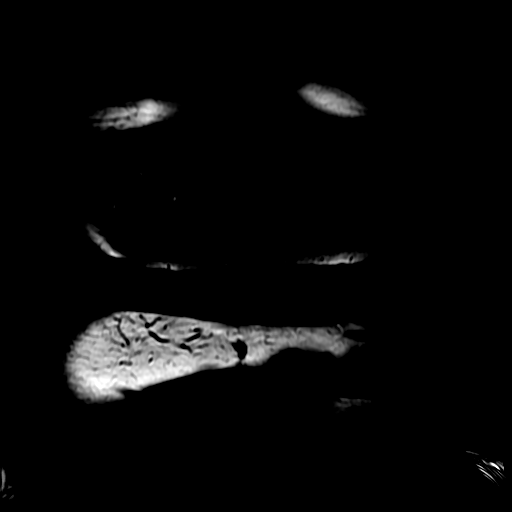

[Series 8: T2 · axial · 5.0mm · 0.86mm/px · z∈[-106,+159]mm · 2 of 54 slices shown]
[im 1/54]
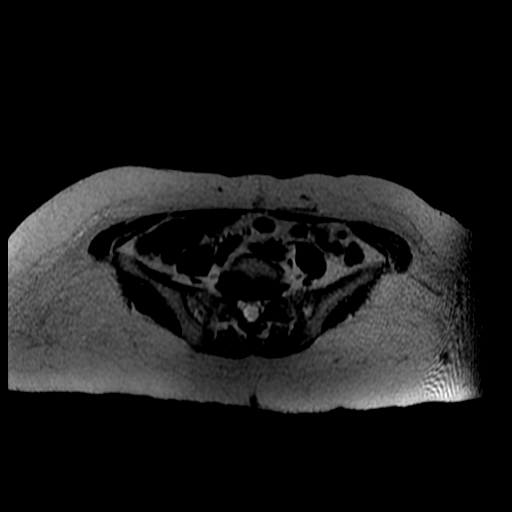
[im 54/54]
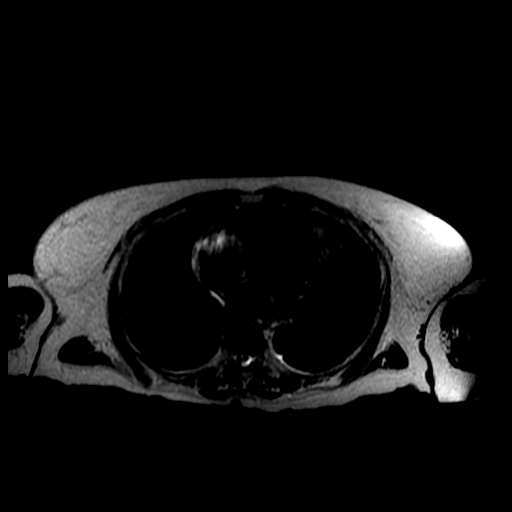

[Series 9: ax dualecho bh · axial · 5.0mm · 0.86mm/px · z∈[-108,+157]mm · 3 of 108 slices shown]
[im 1/108]
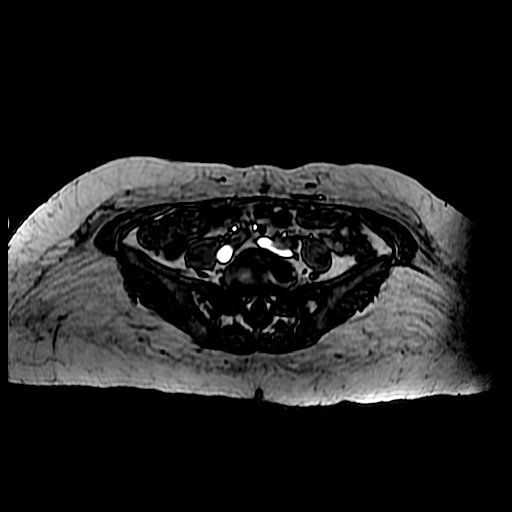
[im 54/108]
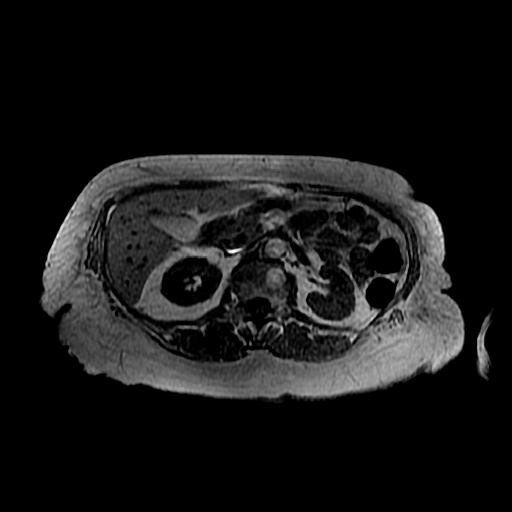
[im 108/108]
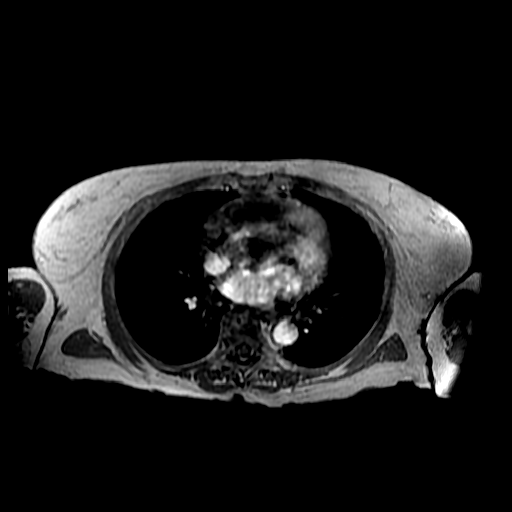

[Series 10: MRCP · sagittal · 40.0mm · 0.70mm/px · 1 of 9 slices shown (2 of 2)]
[im 1/9]
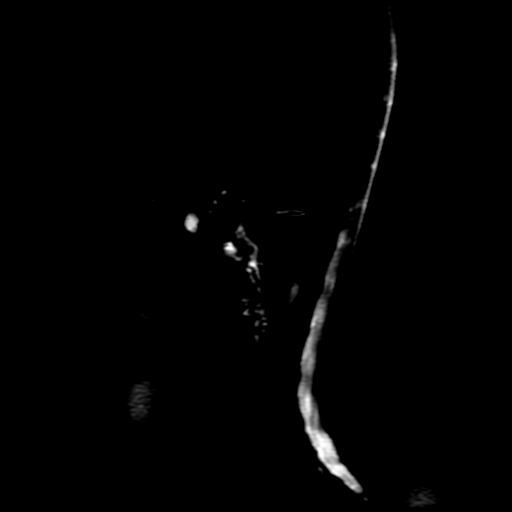

[Series 12: T1 dynamic · coronal · delayed · 4.0mm · 0.78mm/px · 2 of 80 slices shown]
[im 1/80]
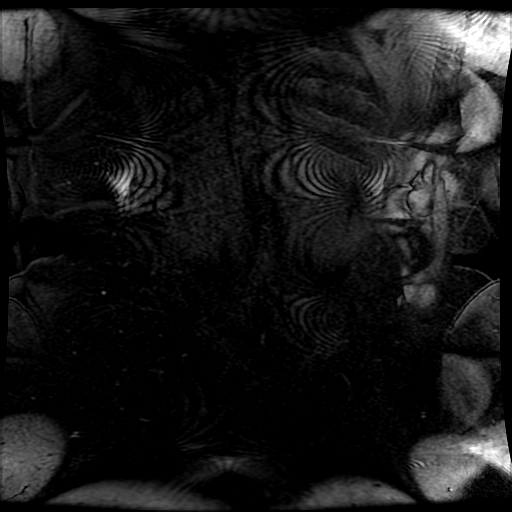
[im 80/80]
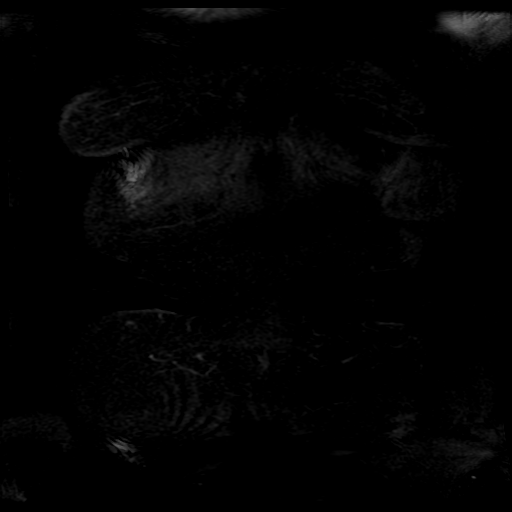

[Series 400: DWI · axial · 6.0mm · 1.76mm/px · 1 of 39 slices shown]
[im 1/39]
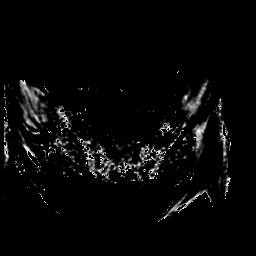

[Series 500: pjn · sagittal · 1.6mm · 0.39mm/px · 1 of 18 slices shown (1 of 3)]
[im 1/18]
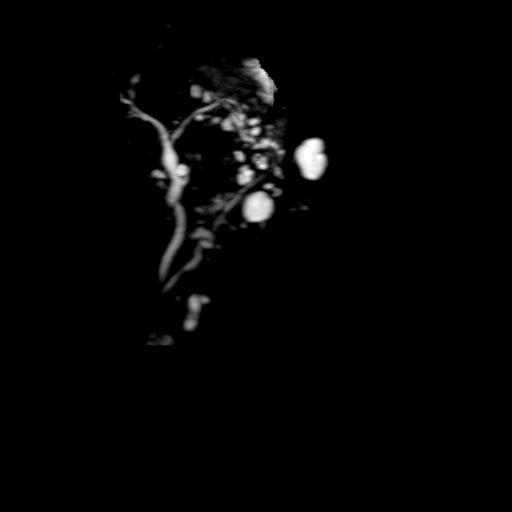

[Series 501: pjn · axial · 1.6mm · 0.59mm/px · 1 of 19 slices shown (2 of 3)]
[im 1/19]
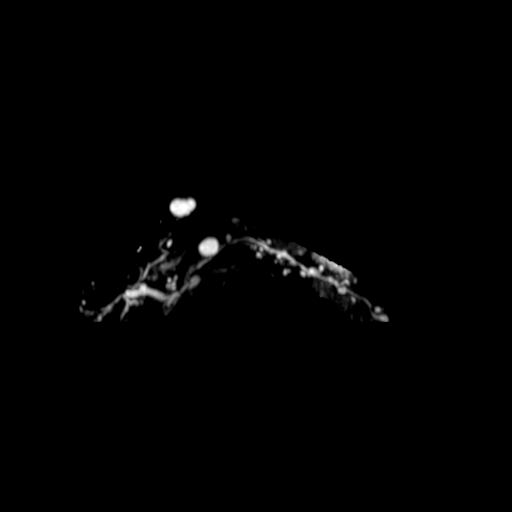

[Series 502: pjn · axial · 0.6mm · 0.62mm/px · z∈[-79,-29]mm · 3 of 145 slices shown (3 of 3)]
[im 1/145]
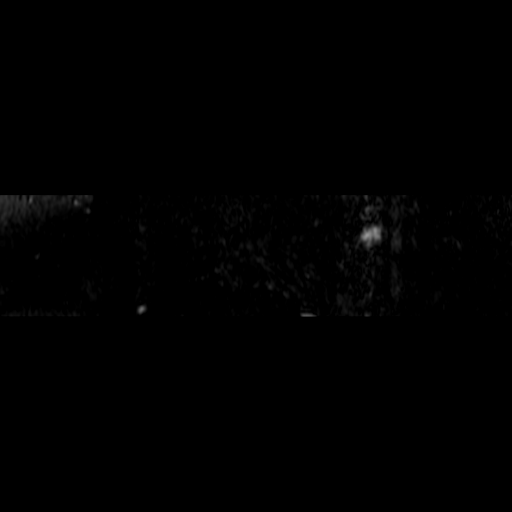
[im 49/145]
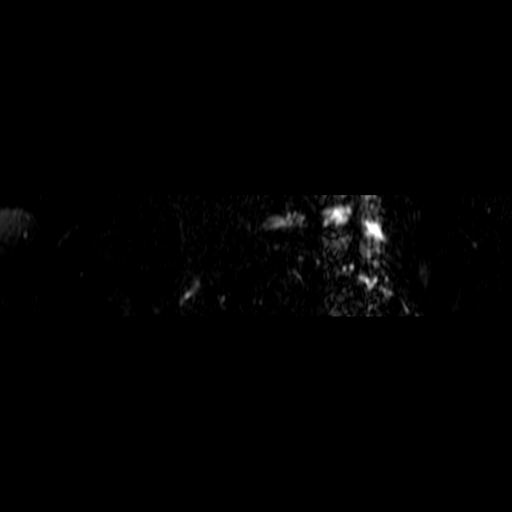
[im 97/145]
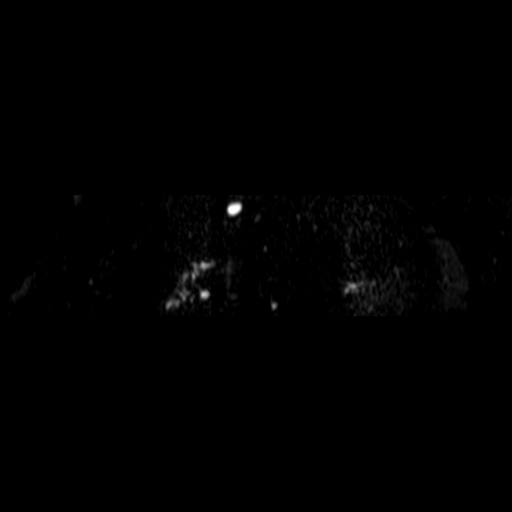

[21 of 48 positions shown; findings below may reference images not displayed]

FINDINGS: Lower chest: Lung bases are clear.

Hepatobiliary: Several benign cysts in the liver parenchyma
unchanged. No biliary duct dilatation. Gallbladder normal.

Pancreas: On the fat-suppressed T2 weighted series (series 3), again
demonstrated unilocular cystic lesion within the body and tail of
the pancreas. The largest unilocular cyst is at the neck of the
pancreas measuring 1.1 cm (image [DATE]) not changed from 1.0 cm on
prior. No Internal complexity. No clear communication with the
pancreatic duct.

There is side branch ductal ectasia in the tail of the pancreas with
smaller cystic crypts measuring up to 5 mm (image [DATE]) and also
unchanged.

There is no dilatation of the pancreatic duct. The common bile duct
is normal caliber.

Spleen:  Normal spleen

Adrenals/Urinary Tract: Adrenal glands normal. No renal obstruction.
Normal renal parenchyma

Stomach/Bowel: Stomach and limited view of the small bowel and colon
are unremarkable. Duodenum normal.

Vascular/Lymphatic: Aneurysm of the aorta. No retroperitoneal
adenopathy

Other:  No free fluid

Musculoskeletal: No aggressive osseous lesion
IMPRESSION: 1. Stable unilocular cyst at the genu of the pancreas without ductal
communication. No mucinous features. Favor post inflammatory cysts.
Recommend follow-up MRI in 2 years from current year. This
recommendation follows ACR consensus guidelines: Management of
Incidental Pancreatic Cysts: A White Paper of the ACR Incidental
Findings Committee. [HOSPITAL] 0248;[DATE].
2. Stable ductal ectasia in the tail the pancreas.

## 2020-11-04 ENCOUNTER — Telehealth (HOSPITAL_COMMUNITY): Payer: Self-pay | Admitting: *Deleted

## 2020-11-04 NOTE — Telephone Encounter (Signed)
Patient given detailed instructions per Myocardial Perfusion Study Information Sheet for the test on 11/10/19 at 8:15. Patient notified to arrive 15 minutes early and that it is imperative to arrive on time for appointment to keep from having the test rescheduled.  If you need to cancel or reschedule your appointment, please call the office within 24 hours of your appointment. . Patient verbalized understanding.Daneil Dolin

## 2020-11-05 ENCOUNTER — Telehealth: Payer: Self-pay

## 2020-11-05 ENCOUNTER — Ambulatory Visit (INDEPENDENT_AMBULATORY_CARE_PROVIDER_SITE_OTHER): Payer: Medicare HMO | Admitting: Nurse Practitioner

## 2020-11-05 ENCOUNTER — Other Ambulatory Visit: Payer: Self-pay

## 2020-11-05 ENCOUNTER — Encounter: Payer: Self-pay | Admitting: Nurse Practitioner

## 2020-11-05 DIAGNOSIS — S134XXD Sprain of ligaments of cervical spine, subsequent encounter: Secondary | ICD-10-CM | POA: Diagnosis not present

## 2020-11-05 DIAGNOSIS — M159 Polyosteoarthritis, unspecified: Secondary | ICD-10-CM

## 2020-11-05 DIAGNOSIS — Z0000 Encounter for general adult medical examination without abnormal findings: Secondary | ICD-10-CM

## 2020-11-05 DIAGNOSIS — G8929 Other chronic pain: Secondary | ICD-10-CM | POA: Diagnosis not present

## 2020-11-05 DIAGNOSIS — M8949 Other hypertrophic osteoarthropathy, multiple sites: Secondary | ICD-10-CM | POA: Diagnosis not present

## 2020-11-05 DIAGNOSIS — M545 Low back pain, unspecified: Secondary | ICD-10-CM | POA: Diagnosis not present

## 2020-11-05 DIAGNOSIS — M542 Cervicalgia: Secondary | ICD-10-CM

## 2020-11-05 MED ORDER — TRAMADOL HCL 50 MG PO TABS: ORAL_TABLET | ORAL | 0 refills | Status: DC

## 2020-11-05 NOTE — Patient Instructions (Addendum)
Colleen [name] , Thank you for taking time to come for your Medicare Wellness Visit. I appreciate your ongoing commitment to your health goals. Please review the following plan we discussed and let me know if I can assist you in the future.   Screening recommendations/referrals: Colonoscopy aged out Mammogram up to date Bone Density up to date Recommended yearly ophthalmology/optometry visit for glaucoma screening and checkup Recommended yearly dental visit for hygiene and checkup  Vaccinations: Influenza vaccine up to date Pneumococcal vaccine up to date Tdap vaccine up to date Shingles vaccine up to date    Advanced directives: on file.   Conditions/risks identified: cardiovascular risk, obesity  Next appointment: 1 year    Preventive Care 11 Years and Older, Female Preventive care refers to lifestyle choices and visits with your health care provider that can promote health and wellness. What does preventive care include?  A yearly physical exam. This is also called an annual well check.  Dental exams once or twice a year.  Routine eye exams. Ask your health care provider how often you should have your eyes checked.  Personal lifestyle choices, including:  Daily care of your teeth and gums.  Regular physical activity.  Eating a healthy diet.  Avoiding tobacco and drug use.  Limiting alcohol use.  Practicing safe sex.  Taking low-dose aspirin every day.  Taking vitamin and mineral supplements as recommended by your health care provider. What happens during an annual well check? The services and screenings done by your health care provider during your annual well check will depend on your age, overall health, lifestyle risk factors, and family history of disease. Counseling  Your health care provider may ask you questions about your:  Alcohol use.  Tobacco use.  Drug use.  Emotional well-being.  Home and relationship well-being.  Sexual  activity.  Eating habits.  History of falls.  Memory and ability to understand (cognition).  Work and work Astronomer.  Reproductive health. Screening  You may have the following tests or measurements:  Height, weight, and BMI.  Blood pressure.  Lipid and cholesterol levels. These may be checked every 5 years, or more frequently if you are over 71 years old.  Skin check.  Lung cancer screening. You may have this screening every year starting at age 16 if you have a 30-pack-year history of smoking and currently smoke or have quit within the past 15 years.  Fecal occult blood test (FOBT) of the stool. You may have this test every year starting at age 34.  Flexible sigmoidoscopy or colonoscopy. You may have a sigmoidoscopy every 5 years or a colonoscopy every 10 years starting at age 61.  Hepatitis C blood test.  Hepatitis B blood test.  Sexually transmitted disease (STD) testing.  Diabetes screening. This is done by checking your blood sugar (glucose) after you have not eaten for a while (fasting). You may have this done every 1-3 years.  Bone density scan. This is done to screen for osteoporosis. You may have this done starting at age 49.  Mammogram. This may be done every 1-2 years. Talk to your health care provider about how often you should have regular mammograms. Talk with your health care provider about your test results, treatment options, and if necessary, the need for more tests. Vaccines  Your health care provider may recommend certain vaccines, such as:  Influenza vaccine. This is recommended every year.  Tetanus, diphtheria, and acellular pertussis (Tdap, Td) vaccine. You may need a Td booster every  10 years.  Zoster vaccine. You may need this after age 78.  Pneumococcal 13-valent conjugate (PCV13) vaccine. One dose is recommended after age 65.  Pneumococcal polysaccharide (PPSV23) vaccine. One dose is recommended after age 30. Talk to your health care  provider about which screenings and vaccines you need and how often you need them. This information is not intended to replace advice given to you by your health care provider. Make sure you discuss any questions you have with your health care provider. Document Released: 11/13/2015 Document Revised: 07/06/2016 Document Reviewed: 08/18/2015 Elsevier Interactive Patient Education  2017 [employer] Prevention in the Home Falls can cause injuries. They can happen to people of all ages. There are many things you can do to make your home safe and to help prevent falls. What can I do on the outside of my home?  Regularly fix the edges of walkways and driveways and fix any cracks.  Remove anything that might make you trip as you walk through a door, such as a raised step or threshold.  Trim any bushes or trees on the path to your home.  Use bright outdoor lighting.  Clear any walking paths of anything that might make someone trip, such as rocks or tools.  Regularly check to see if handrails are loose or broken. Make sure that both sides of any steps have handrails.  Any raised decks and porches should have guardrails on the edges.  Have any leaves, snow, or ice cleared regularly.  Use sand or salt on walking paths during winter.  Clean up any spills in your garage right away. This includes oil or grease spills. What can I do in the bathroom?  Use night lights.  Install grab bars by the toilet and in the tub and shower. Do not use towel bars as grab bars.  Use non-skid mats or decals in the tub or shower.  If you need to sit down in the shower, use a plastic, non-slip stool.  Keep the floor dry. Clean up any water that spills on the floor as soon as it happens.  Remove soap buildup in the tub or shower regularly.  Attach bath mats securely with double-sided non-slip rug tape.  Do not have throw rugs and other things on the floor that can make you trip. What can I do in  the bedroom?  Use night lights.  Make sure that you have a light by your bed that is easy to reach.  Do not use any sheets or blankets that are too big for your bed. They should not hang down onto the floor.  Have a firm chair that has side arms. You can use this for support while you get dressed.  Do not have throw rugs and other things on the floor that can make you trip. What can I do in the kitchen?  Clean up any spills right away.  Avoid walking on wet floors.  Keep items that you use a lot in easy-to-reach places.  If you need to reach something above you, use a strong step stool that has a grab bar.  Keep electrical cords out of the way.  Do not use floor polish or wax that makes floors slippery. If you must use wax, use non-skid floor wax.  Do not have throw rugs and other things on the floor that can make you trip. What can I do with my stairs?  Do not leave any items on the stairs.  Make sure  that there are handrails on both sides of the stairs and use them. Fix handrails that are broken or loose. Make sure that handrails are as long as the stairways.  Check any carpeting to make sure that it is firmly attached to the stairs. Fix any carpet that is loose or worn.  Avoid having throw rugs at the top or bottom of the stairs. If you do have throw rugs, attach them to the floor with carpet tape.  Make sure that you have a light switch at the top of the stairs and the bottom of the stairs. If you do not have them, ask someone to add them for you. What else can I do to help prevent falls?  Wear shoes that:  Do not have high heels.  Have rubber bottoms.  Are comfortable and fit you well.  Are closed at the toe. Do not wear sandals.  If you use a stepladder:  Make sure that it is fully opened. Do not climb a closed stepladder.  Make sure that both sides of the stepladder are locked into place.  Ask someone to hold it for you, if possible.  Clearly mark and  make sure that you can see:  Any grab bars or handrails.  First and last steps.  Where the edge of each step is.  Use tools that help you move around (mobility aids) if they are needed. These include:  Canes.  Walkers.  Scooters.  Crutches.  Turn on the lights when you go into a dark area. Replace any light bulbs as soon as they burn out.  Set up your furniture so you have a clear path. Avoid moving your furniture around.  If any of your floors are uneven, fix them.  If there are any pets around you, be aware of where they are.  Review your medicines with your doctor. Some medicines can make you feel dizzy. This can increase your chance of falling. Ask your doctor what other things that you can do to help prevent falls. This information is not intended to replace advice given to you by your health care provider. Make sure you discuss any questions you have with your health care provider. Document Released: 08/13/2009 Document Revised: 03/24/2016 Document Reviewed: 11/21/2014 Elsevier Interactive Patient Education  2017 [employer].

## 2020-11-05 NOTE — Telephone Encounter (Signed)
[name], credeur are scheduled for a virtual visit with your provider today.    Just as we do with appointments in the office, we must obtain your consent to participate.  Your consent will be active for this visit and any virtual visit you may have with one of our providers in the next 365 days.    If you have a MyChart account, I can also send a copy of this consent to you electronically.  All virtual visits are billed to your insurance company just like a traditional visit in the office.  As this is a virtual visit, video technology does not allow for your provider to perform a traditional examination.  This may limit your provider's ability to fully assess your condition.  If your provider identifies any concerns that need to be evaluated in person or the need to arrange testing such as labs, EKG, etc, we will make arrangements to do so.    Although advances in technology are sophisticated, we cannot ensure that it will always work on either your end or our end.  If the connection with a video visit is poor, we may have to switch to a telephone visit.  With either a video or telephone visit, we are not always able to ensure that we have a secure connection.   I need to obtain your verbal consent now.   Are you willing to proceed with your visit today?   Colleen [name] has provided verbal consent on 11/05/2020 for a virtual visit (video or telephone).   Elveria Royals, CMA 11/05/2020  10:19 AM

## 2020-11-05 NOTE — Progress Notes (Signed)
Subjective:   Colleen [name] is a 75 y.o. female who presents for Medicare Annual (Subsequent) preventive examination.  Review of Systems     Cardiac Risk Factors include: advanced age (>5men, >81 women);obesity (BMI >30kg/m2);dyslipidemia     Objective:    There were no vitals filed for this visit. There is no height or weight on file to calculate BMI.  Advanced Directives 11/05/2020 09/29/2020 04/10/2020 03/13/2020 11/04/2019 08/28/2019 02/28/2019  Does Patient Have a Medical Advance Directive? Yes Yes Yes Yes Yes No Yes  Type of Estate agent of [place];Out of facility DNR (pink MOST or yellow form) Healthcare Power of [place];Out of facility DNR (pink MOST or yellow form) Healthcare Power of [employer] Power of [employer] Power of Attorney - Healthcare Power of Attorney  Does patient want to make changes to medical advance directive? No - Patient declined No - Patient declined No - Patient declined No - Patient declined No - Patient declined - No - Patient declined  Copy of Healthcare Power of Attorney in Chart? Yes - validated most recent copy scanned in chart (See row information) Yes - validated most recent copy scanned in chart (See row information) Yes - validated most recent copy scanned in chart (See row information) Yes - validated most recent copy scanned in chart (See row information) Yes - validated most recent copy scanned in chart (See row information) - Yes - validated most recent copy scanned in chart (See row information)  Would patient like information on creating a medical advance directive? - - - - - No - Patient declined -    Current Medications (verified) Outpatient Encounter Medications as of 11/05/2020  Medication Sig  . acetaminophen (TYLENOL) 500 MG tablet Take 1,000 mg by mouth every 8 (eight) hours as needed for mild pain.  . Ascorbic Acid (VITAMIN C PO) Take 1 tablet by mouth daily.  Marland Kitchen CALCIUM-VITAMIN D PO Take 2 tablets  by mouth daily.   Marland Kitchen CRANBERRY PO Take 1 tablet by mouth daily.  . diclofenac Sodium (VOLTAREN) 1 % GEL Apply 2 g topically 4 (four) times daily as needed.  . hydrochlorothiazide (HYDRODIURIL) 12.5 MG tablet TAKE 1 TABLET BY MOUTH EVERY DAY  . hydroxypropyl methylcellulose / hypromellose (ISOPTO TEARS / GONIOVISC) 2.5 % ophthalmic solution Place 1 drop into both eyes as needed for dry eyes.   Marland Kitchen levothyroxine (SYNTHROID) 25 MCG tablet Take 1 tablet (25 mcg total) by mouth daily before breakfast.  . linaclotide (LINZESS) 145 MCG CAPS capsule Take 1 capsule (145 mcg total) by mouth daily before breakfast. Office visit for further refills  . meloxicam (MOBIC) 7.5 MG tablet TAKE 1 TABLET(7.5 MG) BY MOUTH DAILY  . Multiple Vitamin (MULTIVITAMIN WITH MINERALS) TABS Take 1 tablet by mouth daily.  Marland Kitchen omeprazole (PRILOSEC) 40 MG capsule TAKE 1 CAPSULE BY MOUTH EVERY DAY  . rOPINIRole (REQUIP) 0.25 MG tablet TAKE 1 TABLET BY MOUTH EVERY DAY AFTER SUPPER. TAKE 1-2 HRS BEFORE GOING TO BEDTIME  . [DISCONTINUED] traMADol (ULTRAM) 50 MG tablet Take one tablet by mouth every 6 hours as needed for pain  . traMADol (ULTRAM) 50 MG tablet Take one tablet by mouth every 6 hours as needed for pain   No facility-administered encounter medications on file as of 11/05/2020.    Allergies (verified) Patient has no known allergies.   History: Past Medical History:  Diagnosis Date  . Allergic rhinitis   . Anxiety   . Arthritis   . Atrophic vaginitis 06/25/2019  .  Breast screening 10/26/2010   Last Assessment & Plan:  Formatting of this note might be different from the original. Will order a mammogram for this fall;  Follow up with me around Thanksgiving.  . Callus of heel 02/19/2013  . Colon polyps    adenomatous  . Constipation 08/20/2013  . Corns and callosities   . Diverticulosis   . Dysuria 06/03/2011   Last Assessment & Plan:  Formatting of this note might be different from the original. New, acute complaint  today;  Will check urinalysis with her labs today;  . GERD (gastroesophageal reflux disease)   . Glaucoma   . H/O total hysterectomy 02/19/2013  . Hallux valgus 10/26/2010   Last Assessment & Plan:  Formatting of this note might be different from the original. Had surgery with Dr Jomarie Longs back in October;   Will have her follow up with him.  Marland Kitchen Heartburn 04/30/2014  . Helicobacter pylori gastritis 08/12/2014  . Hiatal hernia with gastroesophageal reflux 12/23/2014  . Hypercholesteremia    no meds  . Hypercholesterolemia 06/03/2011   Overview:  TC 205 (elevated) in 2010  Last Assessment & Plan:  Formatting of this note might be different from the original. Lab Results  Component Value Date   CHOL 205 12/09/2011   CHOL 233 06/03/2011   CHOL 205 05/26/2009   Lab Results  Component Value Date   HDL 82 12/09/2011   HDL 90 06/03/2011   HDL 75 05/26/2009   Lab Results  Component Value Date   LDLCALC 113 12/09/2011   [place] 133 06/03/2011   LDLCA  . Hypothyroidism   . Left-sided low back pain with left-sided sciatica 08/12/2014  . Neck pain 10/26/2010  . Obesity   . Osteoarthritis 10/26/2010  . Osteopenia 01/08/2014  . Overweight 10/26/2010  . Postmenopausal estrogen deficiency 02/19/2013  . Recurrent UTI 06/25/2019  . Restless leg syndrome 08/20/2013  . RLS (restless legs syndrome) 07/08/2014  . Routine general medical examination at a health care facility 02/19/2013  . S/P carpal tunnel release 10/26/2010  . Screen for colon cancer 12/16/2011   Last Assessment & Plan:  Formatting of this note might be different from the original. Refer to GI;  . Screening for diabetes mellitus   . Shoulder pain, right 12/16/2011   Last Assessment & Plan:  Formatting of this note might be different from the original. We have tried physical therapy;   We shot and reviewed films today;  She has  1)  Osteopenia 2)  Small spur at humeral head 3)  Some hypertrophic/lipping changes at the inferior glenoid rim;   I also injected it last visit;   She is on NSAIDS and tylenol;  Will trial voltaren gel;  Then get ortho opinion;  . Skin spots-aging 07/08/2014  . Urge incontinence   . Whiplash injury 06/26/2018   Past Surgical History:  Procedure Laterality Date  . ABDOMINAL HYSTERECTOMY  1985   Dr Mancel Bale  . BUNIONECTOMY Right    Dr Jomarie Longs  . CARPAL TUNNEL RELEASE Bilateral 2010   Dr Laurance Flatten  . [place]   1 time  . COLONOSCOPY    . ROTATOR CUFF REPAIR Right    Dr Theda Sers  . THYROIDECTOMY  1987   Dr Guadlupe Spanish  . TONSILLECTOMY  1951   Family History  Problem Relation Age of Onset  . Leukemia Mother 74  . Lung cancer Father   . Diabetes Brother   . COPD Brother   . Lung cancer Brother   .  Diabetes Maternal Grandmother   . Cancer Maternal Grandfather        unknown type  . Breast cancer Maternal Aunt   . Colon polyps Neg Hx   . Rectal cancer Neg Hx    Social History   Socioeconomic History  . Marital status: Widowed    Spouse name: Not on file  . Number of children: 1  . Years of education: Not on file  . Highest education level: Not on file  Occupational History  . Occupation: retired  Tobacco Use  . Smoking status: Former Smoker    Types: Cigarettes    Quit date: 10/31/1986    Years since quitting: 34.0  . Smokeless tobacco: Never Used  Vaping Use  . Vaping Use: Never used  Substance and Sexual Activity  . Alcohol use: No    Alcohol/week: 0.0 standard drinks  . Drug use: No  . Sexual activity: Never  Other Topics Concern  . Not on file  Social History Narrative  . Not on file   Social Determinants of Health   Financial Resource Strain: Not on file  Food Insecurity: Not on file  Transportation Needs: Not on file  Physical Activity: Not on file  Stress: Not on file  Social Connections: Not on file    Tobacco Counseling Counseling given: Not Answered   Clinical Intake:  Pre-visit preparation completed: Yes  Pain : No/denies pain     BMI - recorded: 32 Nutritional Status: BMI  > 30  Obese Nutritional Risks: None Diabetes: No  How often do you need to have someone help you when you read instructions, pamphlets, or other written materials from your doctor or pharmacy?: 1 - Never  Diabetic?no         Activities of Daily Living In your present state of health, do you have any difficulty performing the following activities: 11/05/2020  Hearing? N  Vision? N  Difficulty concentrating or making decisions? Y  Walking or climbing stairs? N  Dressing or bathing? N  Doing errands, shopping? N  Preparing Food and eating ? N  Using the Toilet? N  In the past six months, have you accidently leaked urine? N  Do you have problems with loss of bowel control? N  Managing your Medications? N  Managing your Finances? N  Housekeeping or managing your Housekeeping? N  Some recent data might be hidden    Patient Care Team: [name], NP as PCP - General (Nurse Practitioner)  Indicate any recent Medical Services you may have received from other than [facility] providers in the past year (date may be approximate).     Assessment:   This is a routine wellness examination for [facility].  Hearing/Vision screen  Hearing Screening   125Hz  250Hz  500Hz  1000Hz  2000Hz  3000Hz  4000Hz  6000Hz  8000Hz   Right ear:           Left ear:           Comments: Patient has no hearing problems  Vision Screening Comments: Patient wears glasses. Patient has had a recent eye exam within past year. She sees Dr. Catalina Antigua at [facility] issues and exercise activities discussed: Current Exercise Habits: The patient does not participate in regular exercise at present  Goals    . Weight (lb) < 170 lb (77.1 kg)     Starting 10/20/16, I will attempt to decrease my weight to get to 170 lbs.  11/05/2020- would like to get down to 165 lbs  Depression Screen PHQ 2/9 Scores 11/05/2020 03/13/2020 11/26/2019 11/04/2019 08/28/2019 10/29/2018 08/06/2018  PHQ - 2 Score 0 0 0 0 0 0 0    Fall  Risk Fall Risk  11/05/2020 09/29/2020 03/13/2020 02/24/2020 11/26/2019  Falls in the past year? 1 0 1 1 0  Comment - - - Misstep on curb -  Number falls in past yr: 1 0 0 0 0  Injury with Fall? 0 0 0 1 0  Comment - - - Bruising -    FALL RISK PREVENTION PERTAINING TO THE HOME:   Any stairs in or around the home? Yes  If so, are there any without handrails? No  Home free of loose throw rugs in walkways, pet beds, electrical cords, etc? yes Adequate lighting in your home to reduce risk of falls? Yes   ASSISTIVE DEVICES UTILIZED TO PREVENT FALLS:  Life alert? No  Use of a cane, walker or w/c? No  Grab bars in the bathroom? Yes  Shower chair or bench in shower? No  Elevated toilet seat or a handicapped toilet? Yes   TIMED UP AND GO:  Was the test performed? No .    Cognitive Function: MMSE - Mini Mental State Exam 10/29/2018 10/26/2017 10/20/2016 10/22/2015 07/08/2014  Not completed: - - - (No Data) -  Orientation to time 5 4 5 5 5   Orientation to Place 5 5 5 5 5   Registration 3 3 3 3 3   Attention/ Calculation 5 5 5 5 5   Recall 2 2 3 3 2   Language- name 2 objects 2 2 2 2 2   Language- repeat 1 1 1 1 1   Language- follow 3 step command 3 3 3 2 3   Language- read & follow direction 1 1 1 1 1   Write a sentence 1 1 1 1 1   Copy design 1 1 1 1 1   Total score 29 28 30 29 29      6CIT Screen 11/05/2020 11/04/2019  What Year? 0 points 0 points  What month? 0 points 0 points  What time? 0 points 0 points  Count back from 20 0 points 0 points  Months in reverse 2 points 0 points  Repeat phrase 2 points 0 points  Total Score 4 0    Immunizations Immunization History  Administered Date(s) Administered  . Influenza, High Dose Seasonal PF 08/26/2017, 08/06/2018, 07/28/2019, 08/13/2020  . Influenza,inj,Quad PF,6+ Mos 07/08/2014  . Influenza-Unspecified 08/09/2012, 08/01/2013, 06/30/2015, 08/04/2016, 07/31/2017  . PFIZER SARS-COV-2 Vaccination 11/19/2019, 12/10/2019, 07/22/2020  .  Pneumococcal Conjugate-13 11/03/2014  . Pneumococcal Polysaccharide-23 02/19/2013, 08/20/2017  . Td 10/31/2000  . Tdap 10/22/2015  . Varicella 06/15/2011  . Zoster 10/31/2010  . Zoster Recombinat (Shingrix) 03/21/2017, 06/04/2017    TDAP status: Up to date  Flu Vaccine status: Up to date  Pneumococcal vaccine status: Up to date  Covid-19 vaccine status: Completed vaccines  Qualifies for Shingles Vaccine? Yes   Zostavax completed Yes   Shingrix Completed?: Yes  Screening Tests Health Maintenance  Topic Date Due  . TETANUS/TDAP  10/21/2025  . INFLUENZA VACCINE  Completed  . DEXA SCAN  Completed  . COVID-19 Vaccine  Completed  . Hepatitis C Screening  Completed  . PNA vac Low Risk Adult  Completed    Health Maintenance  There are no preventive care reminders to display for this patient.  Colorectal cancer screening: No longer required.   Mammogram status: Completed 2021. Repeat every year  Bone Density status: Completed 06/06/2019. Results reflect: Bone density results:  OSTEOPENIA. Repeat every 2 years.  Lung Cancer Screening: (Low Dose CT Chest recommended if Age 59-80 years, 30 pack-year currently smoking OR have quit w/in 15years.) does not qualify.   Lung Cancer Screening Referral: na  Additional Screening:  Hepatitis C Screening: does qualify; Completed 2016   Vision Screening: Recommended annual ophthalmology exams for early detection of glaucoma and other disorders of the eye. Is the patient up to date with their annual eye exam?  Yes  Who is the provider or what is the name of the office in which the patient attends annual eye exams? [facility] If pt is not established with a provider, would they like to be referred to a provider to establish care? No .   Dental Screening: Recommended annual dental exams for proper oral hygiene  Community Resource Referral / Chronic Care Management: CRR required this visit?  No   CCM required this visit?  No      Plan:      I have personally reviewed and noted the following in the patient's chart:   . Medical and social history . Use of alcohol, tobacco or illicit drugs  . Current medications and supplements . Functional ability and status . Nutritional status . Physical activity . Advanced directives . List of other physicians . Hospitalizations, surgeries, and ER visits in previous 12 months . Vitals . Screenings to include cognitive, depression, and falls . Referrals and appointments  In addition, I have reviewed and discussed with patient certain preventive protocols, quality metrics, and best practice recommendations. A written personalized care plan for preventive services as well as general preventive health recommendations were provided to patient.     [name], NP   11/05/2020    Virtual Visit via Telephone Note  I connected with@ on 11/05/20 at  9:30 AM EST by telephone and verified that I am speaking with the correct person using two identifiers.  Location: Patient: home Provider: [facility]    I discussed the limitations, risks, security and privacy concerns of performing an evaluation and management service by telephone and the availability of in person appointments. I also discussed with the patient that there may be a patient responsible charge related to this service. The patient expressed understanding and agreed to proceed.   I discussed the assessment and treatment plan with the patient. The patient was provided an opportunity to ask questions and all were answered. The patient agreed with the plan and demonstrated an understanding of the instructions.   The patient was advised to call back or seek an in-person evaluation if the symptoms worsen or if the condition fails to improve as anticipated.  I provided 16 minutes of non-face-to-face time during this encounter.  Carlos American. Harle Battiest Avs printed and mailed

## 2020-11-05 NOTE — Progress Notes (Signed)
This service is provided via telemedicine  No vital signs collected/recorded due to the encounter was a telemedicine visit.   Location of patient (ex: home, work):  Home  Patient consents to a telephone visit: Yes, see encounter dated 11/05/2020  Location of the provider (ex: office, home):  [facility]  Name of any referring provider: N/A  Names of all persons participating in the telemedicine service and their role in the encounter: Abbey Chatters, Nurse Practitioner, Elveria Royals, CMA, and patient.   Time spent on call: 9 minutes with medical assistant

## 2020-11-09 ENCOUNTER — Other Ambulatory Visit: Payer: Self-pay

## 2020-11-09 ENCOUNTER — Ambulatory Visit (HOSPITAL_BASED_OUTPATIENT_CLINIC_OR_DEPARTMENT_OTHER): Payer: Medicare HMO

## 2020-11-09 ENCOUNTER — Ambulatory Visit (HOSPITAL_COMMUNITY): Payer: Medicare HMO | Attending: Cardiology

## 2020-11-09 DIAGNOSIS — R0600 Dyspnea, unspecified: Secondary | ICD-10-CM | POA: Insufficient documentation

## 2020-11-09 DIAGNOSIS — R0609 Other forms of dyspnea: Secondary | ICD-10-CM

## 2020-11-09 LAB — MYOCARDIAL PERFUSION IMAGING
LV dias vol: 69 mL (ref 46–106)
LV sys vol: 27 mL
Peak HR: 64 {beats}/min
Rest HR: 49 {beats}/min
SDS: 0
SRS: 0
SSS: 0
TID: 0.81

## 2020-11-09 LAB — ECHOCARDIOGRAM COMPLETE
Area-P 1/2: 3.17 cm2
Height: 61 in
S' Lateral: 2.6 cm
Weight: 2784 oz

## 2020-11-09 MED ORDER — TECHNETIUM TC 99M TETROFOSMIN IV KIT
10.1000 | PACK | Freq: Once | INTRAVENOUS | Status: AC | PRN
  Administered 2020-11-09: 10.1 via INTRAVENOUS
  Filled 2020-11-09: qty 11

## 2020-11-09 MED ORDER — TECHNETIUM TC 99M TETROFOSMIN IV KIT
30.4000 | PACK | Freq: Once | INTRAVENOUS | Status: AC | PRN
  Administered 2020-11-09: 30.4 via INTRAVENOUS
  Filled 2020-11-09: qty 31

## 2020-11-09 MED ORDER — REGADENOSON 0.4 MG/5ML IV SOLN
0.4000 mg | Freq: Once | INTRAVENOUS | Status: AC
Start: 2020-11-09 — End: 2020-11-09
  Administered 2020-11-09: 0.4 mg via INTRAVENOUS

## 2020-11-11 ENCOUNTER — Other Ambulatory Visit: Payer: Self-pay

## 2020-11-17 ENCOUNTER — Other Ambulatory Visit: Payer: Self-pay | Admitting: Family

## 2020-11-17 ENCOUNTER — Other Ambulatory Visit: Payer: Self-pay | Admitting: Nurse Practitioner

## 2020-11-17 DIAGNOSIS — R600 Localized edema: Secondary | ICD-10-CM

## 2020-11-17 MED ORDER — HYDROCHLOROTHIAZIDE 12.5 MG PO TABS: 6.2500 mg | ORAL_TABLET | ORAL | 0 refills | Status: DC

## 2020-11-17 NOTE — Telephone Encounter (Signed)
Pharmacy requested refill.  Pended Rx and sent to [facility] for approval due to OV Note gives different dosage than whats in current medication list.    OV Note dated 10/14/2020: Assessment/Plan 1. Edema, lower extremity Resolved at this time. Continues with compression hose, hctz 6.25 mg daily and elevation of lower legs with low sodium diet.  - BASIC METABOLIC PANEL WITH GFR -to keep follow up with cardiologist for additional testing.

## 2020-11-24 ENCOUNTER — Other Ambulatory Visit: Payer: Self-pay | Admitting: Nurse Practitioner

## 2020-11-24 DIAGNOSIS — M159 Polyosteoarthritis, unspecified: Secondary | ICD-10-CM

## 2020-11-24 DIAGNOSIS — M8949 Other hypertrophic osteoarthropathy, multiple sites: Secondary | ICD-10-CM

## 2020-11-25 ENCOUNTER — Other Ambulatory Visit: Payer: Self-pay | Admitting: Nurse Practitioner

## 2020-11-25 DIAGNOSIS — M159 Polyosteoarthritis, unspecified: Secondary | ICD-10-CM

## 2020-11-25 DIAGNOSIS — M8949 Other hypertrophic osteoarthropathy, multiple sites: Secondary | ICD-10-CM

## 2020-11-25 NOTE — Telephone Encounter (Signed)
Patient has request refill on medication "Voltaren 1% Gel". Medication was last refilled 09/14/2020. Patient was directed to use 2 grams four times daily as needed. Patient was given 200g. I tried to send patient a refill but medication has warning. Medication pend and sent to PCP [name], NP . Please Advise.

## 2020-12-04 ENCOUNTER — Other Ambulatory Visit: Payer: Self-pay | Admitting: *Deleted

## 2020-12-04 ENCOUNTER — Other Ambulatory Visit: Payer: Self-pay | Admitting: Nurse Practitioner

## 2020-12-04 MED ORDER — LEVOTHYROXINE SODIUM 25 MCG PO TABS: 25.0000 ug | ORAL_TABLET | Freq: Every day | ORAL | 1 refills | Status: DC

## 2020-12-04 NOTE — Telephone Encounter (Signed)
Patient Requested refill.  

## 2020-12-18 ENCOUNTER — Other Ambulatory Visit: Payer: Self-pay | Admitting: *Deleted

## 2020-12-18 DIAGNOSIS — M542 Cervicalgia: Secondary | ICD-10-CM

## 2020-12-18 DIAGNOSIS — G8929 Other chronic pain: Secondary | ICD-10-CM

## 2020-12-18 DIAGNOSIS — M159 Polyosteoarthritis, unspecified: Secondary | ICD-10-CM

## 2020-12-18 DIAGNOSIS — S134XXD Sprain of ligaments of cervical spine, subsequent encounter: Secondary | ICD-10-CM

## 2020-12-18 DIAGNOSIS — M8949 Other hypertrophic osteoarthropathy, multiple sites: Secondary | ICD-10-CM

## 2020-12-18 DIAGNOSIS — M545 Low back pain, unspecified: Secondary | ICD-10-CM

## 2020-12-18 MED ORDER — TRAMADOL HCL 50 MG PO TABS
ORAL_TABLET | ORAL | 0 refills | Status: DC
Start: 2020-12-18 — End: 2021-02-05

## 2020-12-18 NOTE — Telephone Encounter (Signed)
Patient requested refill Needs contract updated, added to upcoming appointment.  Epic LR: 11/05/2020 Pended Rx and sent to [facility] for approval.

## 2020-12-22 ENCOUNTER — Other Ambulatory Visit: Payer: Self-pay | Admitting: Internal Medicine

## 2021-01-07 ENCOUNTER — Other Ambulatory Visit: Payer: Self-pay | Admitting: Nurse Practitioner

## 2021-01-14 ENCOUNTER — Other Ambulatory Visit: Payer: Self-pay | Admitting: Nurse Practitioner

## 2021-01-14 DIAGNOSIS — R600 Localized edema: Secondary | ICD-10-CM

## 2021-01-15 ENCOUNTER — Telehealth: Payer: Self-pay | Admitting: Nurse Practitioner

## 2021-01-15 MED ORDER — LINACLOTIDE 145 MCG PO CAPS: 145.0000 ug | ORAL_CAPSULE | Freq: Every day | ORAL | 5 refills | Status: DC

## 2021-01-15 NOTE — Addendum Note (Signed)
Addended by: Logan Bores on: 01/15/2021 04:16 PM   Modules accepted: Orders

## 2021-01-15 NOTE — Telephone Encounter (Signed)
Yes we can refill 

## 2021-01-15 NOTE — Telephone Encounter (Signed)
Patient called and said she needed a refill for lingess, she called the pharmacy and they said Janett Billow had to send another script for it

## 2021-01-15 NOTE — Telephone Encounter (Signed)
Linzess previously filled by Dr.Perry (GI doctor). Colleen [name] please advise if you are agreeable to refill this rx for patient or if you prefer that she follow-up with Dr.Perry for refills

## 2021-01-18 ENCOUNTER — Other Ambulatory Visit: Payer: Self-pay | Admitting: Nurse Practitioner

## 2021-01-18 ENCOUNTER — Other Ambulatory Visit: Payer: Self-pay | Admitting: *Deleted

## 2021-01-18 DIAGNOSIS — M159 Polyosteoarthritis, unspecified: Secondary | ICD-10-CM

## 2021-01-18 DIAGNOSIS — M8949 Other hypertrophic osteoarthropathy, multiple sites: Secondary | ICD-10-CM

## 2021-01-18 DIAGNOSIS — S134XXD Sprain of ligaments of cervical spine, subsequent encounter: Secondary | ICD-10-CM

## 2021-01-18 DIAGNOSIS — M542 Cervicalgia: Secondary | ICD-10-CM

## 2021-01-18 MED ORDER — DICLOFENAC SODIUM 1 % EX GEL: CUTANEOUS | 1 refills | Status: DC

## 2021-01-18 NOTE — Telephone Encounter (Signed)
Patient has request refill on medication "Meloxicam 7.5mg ". Patient last refill was 07/29/2020 with 90 tablets to be taken daily and 1 refill. I tried to refill medication but warnings show. Medication pend and sent to provider [name], NP .

## 2021-01-18 NOTE — Telephone Encounter (Signed)
Patient requested refill

## 2021-02-05 ENCOUNTER — Telehealth: Payer: Self-pay | Admitting: *Deleted

## 2021-02-05 ENCOUNTER — Other Ambulatory Visit: Payer: Self-pay

## 2021-02-05 DIAGNOSIS — S134XXD Sprain of ligaments of cervical spine, subsequent encounter: Secondary | ICD-10-CM

## 2021-02-05 DIAGNOSIS — G8929 Other chronic pain: Secondary | ICD-10-CM

## 2021-02-05 DIAGNOSIS — M159 Polyosteoarthritis, unspecified: Secondary | ICD-10-CM

## 2021-02-05 DIAGNOSIS — M542 Cervicalgia: Secondary | ICD-10-CM

## 2021-02-05 DIAGNOSIS — M8949 Other hypertrophic osteoarthropathy, multiple sites: Secondary | ICD-10-CM

## 2021-02-05 MED ORDER — DICLOFENAC SODIUM 1 % EX GEL
CUTANEOUS | 1 refills | Status: DC
Start: 2021-02-05 — End: 2021-05-31

## 2021-02-05 MED ORDER — TRAMADOL HCL 50 MG PO TABS: ORAL_TABLET | ORAL | 0 refills | Status: DC

## 2021-02-05 NOTE — Telephone Encounter (Signed)
Received fax from [facility].  Diclofenac Gel was APPROVED from 10/31/2020-10/30/2021

## 2021-02-05 NOTE — Telephone Encounter (Signed)
Patient would like refill on Tramadol.Patient needs to update contract.  Medication pended and sent to Sherrie Mustache, NP for approval

## 2021-02-05 NOTE — Telephone Encounter (Signed)
Received Prior Authorization from [facility] for Diclofenac Gel Initiated Authorization through [employer].  Key: [id number] Case ID: [id number]  Went into determination. 48-72 hours.  Awaiting Status.

## 2021-02-05 NOTE — Telephone Encounter (Signed)
Patient called requesting refill  Epic LR: 12/18/2020 Needs updated contracted, note added to upcoming appointment.  Pended Rx and sent to [facility] for approval.

## 2021-03-13 ENCOUNTER — Other Ambulatory Visit: Payer: Self-pay | Admitting: Nurse Practitioner

## 2021-03-13 DIAGNOSIS — R600 Localized edema: Secondary | ICD-10-CM

## 2021-03-15 ENCOUNTER — Encounter: Payer: Self-pay | Admitting: Nurse Practitioner

## 2021-03-15 ENCOUNTER — Ambulatory Visit: Payer: Medicare HMO | Admitting: Nurse Practitioner

## 2021-03-15 ENCOUNTER — Other Ambulatory Visit: Payer: Self-pay

## 2021-03-15 VITALS — BP 122/70 | HR 60 | Temp 96.8°F | Ht 61.0 in | Wt 164.0 lb

## 2021-03-15 DIAGNOSIS — S134XXD Sprain of ligaments of cervical spine, subsequent encounter: Secondary | ICD-10-CM

## 2021-03-15 DIAGNOSIS — R600 Localized edema: Secondary | ICD-10-CM | POA: Diagnosis not present

## 2021-03-15 DIAGNOSIS — M545 Low back pain, unspecified: Secondary | ICD-10-CM

## 2021-03-15 DIAGNOSIS — H6123 Impacted cerumen, bilateral: Secondary | ICD-10-CM

## 2021-03-15 DIAGNOSIS — E7800 Pure hypercholesterolemia, unspecified: Secondary | ICD-10-CM | POA: Diagnosis not present

## 2021-03-15 DIAGNOSIS — M159 Polyosteoarthritis, unspecified: Secondary | ICD-10-CM

## 2021-03-15 DIAGNOSIS — M542 Cervicalgia: Secondary | ICD-10-CM | POA: Diagnosis not present

## 2021-03-15 DIAGNOSIS — R7989 Other specified abnormal findings of blood chemistry: Secondary | ICD-10-CM | POA: Diagnosis not present

## 2021-03-15 DIAGNOSIS — G8929 Other chronic pain: Secondary | ICD-10-CM

## 2021-03-15 DIAGNOSIS — M8949 Other hypertrophic osteoarthropathy, multiple sites: Secondary | ICD-10-CM

## 2021-03-15 DIAGNOSIS — I10 Essential (primary) hypertension: Secondary | ICD-10-CM

## 2021-03-15 MED ORDER — TRAMADOL HCL 50 MG PO TABS: ORAL_TABLET | ORAL | 0 refills | Status: DC

## 2021-03-15 MED ORDER — HYDROCHLOROTHIAZIDE 12.5 MG PO TABS: 6.2500 mg | ORAL_TABLET | Freq: Every day | ORAL | 1 refills | Status: DC

## 2021-03-15 NOTE — Progress Notes (Signed)
[name]: Patient Care Team: Sharon Seller, NP as PCP - General (Nurse Practitioner)  PLACE OF SERVICE:  [facility]  Advanced Directive information Does Patient Have a Medical Advance Directive?: Yes, Type of Advance Directive: Healthcare Power of [place];Out of facility DNR (pink MOST or yellow form), Pre-existing out of facility DNR order (yellow form or pink MOST form): Pink MOST form placed in chart (order not valid for inpatient use), Does patient want to make changes to medical advance directive?: No - Patient declined  No Known Allergies  Chief Complaint  Patient presents with  . Medical Management of Chronic Issues    6 month follow-up. Patient c/o bilateral foot swelling off/on (right is worse). Patient c/o right ear concerns.      HPI: Patient is a 75 y.o. female for routine follo wup  Ongoing left shoulder and low back pain- has arthritis and using tramadol and tylenol to help manage pain. Has completed PT in the past.   Bilateral LE edema- right worse than left. Goes and goes  Eats food high in sodium Feel tight but no pain.  Does not report swelling goes down over night.  Already taking HCTZ 12.5 1/2 tablet every other day She is babysitting her 41 month old grand baby not elevating legs like she should.  Has lost weight, not eating as much as she used to.   Feels swooshing in her right ear. No pain or drainage. No nasal congestion or allergies noted.  Review of Systems:  Review of Systems  Constitutional: Negative for chills, fever and weight loss.  HENT: Negative for congestion, hearing loss, sore throat and tinnitus.   Respiratory: Negative for cough, sputum production and shortness of breath.   Cardiovascular: Positive for leg swelling. Negative for chest pain and palpitations.  Gastrointestinal: Negative for abdominal pain, constipation, diarrhea and heartburn.  Genitourinary: Negative for dysuria, frequency and urgency.  Musculoskeletal: Positive  for back pain and joint pain. Negative for falls and myalgias.  Skin: Negative.   Neurological: Negative for dizziness and headaches.  Psychiatric/Behavioral: Negative for depression and memory loss. The patient does not have insomnia.     Past Medical History:  Diagnosis Date  . Allergic rhinitis   . Anxiety   . Arthritis   . Atrophic vaginitis 06/25/2019  . Breast screening 10/26/2010   Last Assessment & Plan:  Formatting of this note might be different from the original. Will order a mammogram for this fall;  Follow up with me around Thanksgiving.  . Callus of heel 02/19/2013  . Colon polyps    adenomatous  . Constipation 08/20/2013  . Corns and callosities   . Diverticulosis   . Dysuria 06/03/2011   Last Assessment & Plan:  Formatting of this note might be different from the original. New, acute complaint today;  Will check urinalysis with her labs today;  . GERD (gastroesophageal reflux disease)   . Glaucoma   . H/O total hysterectomy 02/19/2013  . Hallux valgus 10/26/2010   Last Assessment & Plan:  Formatting of this note might be different from the original. Had surgery with Dr Ernestine Conrad back in October;   Will have her follow up with him.  Marland Kitchen Heartburn 04/30/2014  . Helicobacter pylori gastritis 08/12/2014  . Hiatal hernia with gastroesophageal reflux 12/23/2014  . Hypercholesteremia    no meds  . Hypercholesterolemia 06/03/2011   Overview:  TC 205 (elevated) in 2010  Last Assessment & Plan:  Formatting of this note might be different from  the original. Lab Results  Component Value Date   CHOL 205 12/09/2011   CHOL 233 06/03/2011   CHOL 205 05/26/2009   Lab Results  Component Value Date   HDL 82 12/09/2011   HDL 90 06/03/2011   HDL 75 05/26/2009   Lab Results  Component Value Date   LDLCALC 113 12/09/2011   [place] 133 06/03/2011   LDLCA  . Hypothyroidism   . Left-sided low back pain with left-sided sciatica 08/12/2014  . Neck pain 10/26/2010  . Obesity   . Osteoarthritis 10/26/2010  . Osteopenia  01/08/2014  . Overweight 10/26/2010  . Postmenopausal estrogen deficiency 02/19/2013  . Recurrent UTI 06/25/2019  . Restless leg syndrome 08/20/2013  . RLS (restless legs syndrome) 07/08/2014  . Routine general medical examination at a health care facility 02/19/2013  . S/P carpal tunnel release 10/26/2010  . Screen for colon cancer 12/16/2011   Last Assessment & Plan:  Formatting of this note might be different from the original. Refer to GI;  . Screening for diabetes mellitus   . Shoulder pain, right 12/16/2011   Last Assessment & Plan:  Formatting of this note might be different from the original. We have tried physical therapy;   We shot and reviewed films today;  She has  1)  Osteopenia 2)  Small spur at humeral head 3)  Some hypertrophic/lipping changes at the inferior glenoid rim;   I also injected it last visit;  She is on NSAIDS and tylenol;  Will trial voltaren gel;  Then get ortho opinion;  . Skin spots-aging 07/08/2014  . Urge incontinence   . Whiplash injury 06/26/2018   Past Surgical History:  Procedure Laterality Date  . ABDOMINAL HYSTERECTOMY  1985   Dr Mancel Bale  . BUNIONECTOMY Right    Dr Jomarie Longs  . CARPAL TUNNEL RELEASE Bilateral 2010   Dr Laurance Flatten  . [place]   1 time  . COLONOSCOPY    . ROTATOR CUFF REPAIR Right    Dr Theda Sers  . THYROIDECTOMY  1987   Dr Guadlupe Spanish  . TONSILLECTOMY  1951   Social History:   reports that she quit smoking about 34 years ago. Her smoking use included cigarettes. She has never used smokeless tobacco. She reports that she does not drink alcohol and does not use drugs.  Family History  Problem Relation Age of Onset  . Leukemia Mother 28  . Lung cancer Father   . Diabetes Brother   . COPD Brother   . Lung cancer Brother   . Diabetes Maternal Grandmother   . Cancer Maternal Grandfather        unknown type  . Breast cancer Maternal Aunt   . Colon polyps Neg Hx   . Rectal cancer Neg Hx     Medications: Patient's Medications   New Prescriptions   No medications on file  Previous Medications   ACETAMINOPHEN (TYLENOL) 500 MG TABLET    Take 1,000 mg by mouth every 8 (eight) hours as needed for mild pain.   ASCORBIC ACID (VITAMIN C PO)    Take 1 tablet by mouth daily.   CALCIUM-VITAMIN D PO    Take 2 tablets by mouth daily.    CRANBERRY PO    Take 1 tablet by mouth daily.   DICLOFENAC SODIUM (VOLTAREN) 1 % GEL    APPLY 2 GRAMS TOPICALLY 4 (FOUR) TIMES DAILY AS NEEDED.   HYDROXYPROPYL METHYLCELLULOSE / HYPROMELLOSE (ISOPTO TEARS / GONIOVISC) 2.5 % OPHTHALMIC SOLUTION  Place 1 drop into both eyes as needed for dry eyes.    LEVOTHYROXINE (SYNTHROID) 25 MCG TABLET    Take 1 tablet (25 mcg total) by mouth daily before breakfast.   LINACLOTIDE (LINZESS) 145 MCG CAPS CAPSULE    Take 1 capsule (145 mcg total) by mouth daily before breakfast. Office visit for further refills   MELOXICAM (MOBIC) 7.5 MG TABLET    Take 1 tablet (7.5 mg total) by mouth daily as needed for pain.   MULTIPLE VITAMIN (MULTIVITAMIN WITH MINERALS) TABS    Take 1 tablet by mouth daily.   OMEPRAZOLE (PRILOSEC) 40 MG CAPSULE    TAKE 1 CAPSULE BY MOUTH EVERY DAY   ROPINIROLE (REQUIP) 0.25 MG TABLET    TAKE 1 TABLET BY MOUTH EVERY DAY AFTER SUPPER. TAKE 1-2 HRS BEFORE GOING TO BEDTIME   TRAMADOL (ULTRAM) 50 MG TABLET    Take one tablet by mouth every 6 hours as needed for pain  Modified Medications   Modified Medication Previous Medication   HYDROCHLOROTHIAZIDE (HYDRODIURIL) 12.5 MG TABLET hydrochlorothiazide (HYDRODIURIL) 12.5 MG tablet      TAKE 1/2 TABLET BY MOUTH EVERY OTHER DAY    TAKE 0.5 TABLETS BY MOUTH EVERY OTHER DAY.  Discontinued Medications   No medications on file    Physical Exam:  Vitals:   03/15/21 0857  BP: 122/70  Pulse: 60  Temp: (!) 96.8 F (36 C)  TempSrc: Temporal  SpO2: 99%  Weight: 164 lb (74.4 kg)  Height: 5\' 1"  (1.549 m)   Body mass index is 30.99 kg/m. Wt Readings from Last 3 Encounters:  03/15/21 164 lb (74.4  kg)  11/09/20 174 lb (78.9 kg)  10/14/20 174 lb 6.4 oz (79.1 kg)    Physical Exam Constitutional:      General: She is not in acute distress.    Appearance: She is well-developed. She is not diaphoretic.  HENT:     Head: Normocephalic and atraumatic.     Right Ear: There is impacted cerumen.     Mouth/Throat:     Pharynx: No oropharyngeal exudate.  Eyes:     Conjunctiva/sclera: Conjunctivae normal.     Pupils: Pupils are equal, round, and reactive to light.  Cardiovascular:     Rate and Rhythm: Normal rate and regular rhythm.     Heart sounds: Normal heart sounds.  Pulmonary:     Effort: Pulmonary effort is normal.     Breath sounds: Normal breath sounds.  Abdominal:     General: Bowel sounds are normal.     Palpations: Abdomen is soft.  Musculoskeletal:        General: No tenderness.     Cervical back: Normal range of motion and neck supple.     Right lower leg: Edema (trace) present.     Left lower leg: Edema (trace) present.  Skin:    General: Skin is warm and dry.  Neurological:     Mental Status: She is alert and oriented to person, place, and time.    Labs reviewed: Basic Metabolic Panel: Recent Labs    03/17/20 0814 09/14/20 1102 10/14/20 1203  NA 144 142 143  K 3.9 4.1 3.9  CL 108 107 107  CO2 28 27 29   GLUCOSE 98 104* 100  BUN 14 13 15   CREATININE 1.03* 0.87 0.90  CALCIUM 9.7 9.5 9.4  TSH  --  0.38*  --    Liver Function Tests: Recent Labs    03/17/20 0814 09/14/20 1102  AST  25 25  ALT 17 11  BILITOT 0.5 0.4  PROT 6.3 6.7   No results for input(s): LIPASE, AMYLASE in the last 8760 hours. No results for input(s): AMMONIA in the last 8760 hours. CBC: Recent Labs    09/14/20 1102  WBC 3.6*  NEUTROABS 1,508  HGB 12.1  HCT 37.7  MCV 94.5  PLT 243   Lipid Panel: Recent Labs    03/17/20 0814  CHOL 226*  HDL 102  LDLCALC 110*  TRIG 55  CHOLHDL 2.2   TSH: Recent Labs    09/14/20 1102  TSH 0.38*   A1C: Lab Results   Component Value Date   HGBA1C 5.6 07/05/2014     Assessment/Plan 1. Primary osteoarthritis involving multiple joints -ongoing but controlled with tramadol and tylenol PRN - traMADol (ULTRAM) 50 MG tablet; Take one tablet by mouth every 6 hours as needed for pain  Dispense: 90 tablet; Refill: 0 - CMP - CBC with Differential/Platelet  2. Neck pain Ongoing but controlled with current regimen. - traMADol (ULTRAM) 50 MG tablet; Take one tablet by mouth every 6 hours as needed for pain  Dispense: 90 tablet; Refill: 0  3. Chronic left-sided low back pain without sciatica -controlled with current regimen.  - traMADol (ULTRAM) 50 MG tablet; Take one tablet by mouth every 6 hours as needed for pain  Dispense: 90 tablet; Refill: 0  4. Whiplash injury to neck, subsequent encounter -pain controlled with current regimen.  - traMADol (ULTRAM) 50 MG tablet; Take one tablet by mouth every 6 hours as needed for pain  Dispense: 90 tablet; Refill: 0  5. Hypercholesterolemia Continue dietary modifications  - Lipid panel - CMP  7. Primary hypertension -blood pressure well controlled at this time. Continue low sodium diet with hctz - CMP - CBC with Differential/Platelet  8. Low TSH level Follow up TSH - TSH  9. Bilateral impacted cerumen CMA flushed bilateral ears. Left cleaned. Right starting causing discomfort and cerumen back into eardrum. Recommended debrox BID for 4 days and then come back to office for reassessment.   10. Localized edema -ongoing, worse at times.  -will increase HCTZ 1/2 tablet to every day.  -encouraged to elevate LE -compression hose daily - hydrochlorothiazide (HYDRODIURIL) 12.5 MG tablet; Take 0.5 tablets (6.25 mg total) by mouth daily.  Dispense: 90 tablet; Refill: 1  Next appt: 4 months. Carlos American. [place], [facility] [phone]

## 2021-03-15 NOTE — Patient Instructions (Addendum)
Increase HCTZ 1/2 tablet by mouth to daily  Decrease sodium intake.    [email or web address]">  DASH Eating Plan DASH stands for Dietary Approaches to Stop Hypertension. The DASH eating plan is a healthy eating plan that has been shown to:  Reduce high blood pressure (hypertension).  Reduce your risk for type 2 diabetes, heart disease, and stroke.  Help with weight loss. What are tips for following this plan? Reading food labels  Check food labels for the amount of salt (sodium) per serving. Choose foods with less than 5 percent of the Daily Value of sodium. Generally, foods with less than 300 milligrams (mg) of sodium per serving fit into this eating plan.  To find whole grains, look for the word "whole" as the first word in the ingredient list. Shopping  Buy products labeled as "low-sodium" or "no salt added."  Buy fresh foods. Avoid canned foods and pre-made or frozen meals. Cooking  Avoid adding salt when cooking. Use salt-free seasonings or herbs instead of table salt or sea salt. Check with your health care provider or pharmacist before using salt substitutes.  Do not fry foods. Cook foods using healthy methods such as baking, boiling, grilling, roasting, and broiling instead.  Cook with heart-healthy oils, such as olive, canola, avocado, soybean, or sunflower oil. Meal planning  Eat a balanced diet that includes: ? 4 or more servings of fruits and 4 or more servings of vegetables each day. Try to fill one-half of your plate with fruits and vegetables. ? 6-8 servings of whole grains each day. ? Less than 6 oz (170 g) of lean meat, poultry, or fish each day. A 3-oz (85-g) serving of meat is about the same size as a deck of cards. One egg equals 1 oz (28 g). ? 2-3 servings of low-fat dairy each day. One serving is 1 cup (237 mL). ? 1 serving of nuts, seeds, or beans 5 times each week. ? 2-3 servings of heart-healthy fats. Healthy  fats called omega-3 fatty acids are found in foods such as walnuts, flaxseeds, fortified milks, and eggs. These fats are also found in cold-water fish, such as sardines, salmon, and mackerel.  Limit how much you eat of: ? Canned or prepackaged foods. ? Food that is high in trans fat, such as some fried foods. ? Food that is high in saturated fat, such as fatty meat. ? Desserts and other sweets, sugary drinks, and other foods with added sugar. ? Full-fat dairy products.  Do not salt foods before eating.  Do not eat more than 4 egg yolks a week.  Try to eat at least 2 vegetarian meals a week.  Eat more home-cooked food and less restaurant, buffet, and fast food.   Lifestyle  When eating at a restaurant, ask that your food be prepared with less salt or no salt, if possible.  If you drink alcohol: ? Limit how much you use to:  0-1 drink a day for women who are not pregnant.  0-2 drinks a day for men. ? Be aware of how much alcohol is in your drink. In the [place] one drink equals one 12 oz bottle of beer (355 mL), one 5 oz glass of wine (148 mL), or one 1 oz glass of hard liquor (44 mL). General information  Avoid eating more than 2,300 mg of salt a day. If you have hypertension, you may need to reduce your sodium intake to 1,500 mg a day.  Work with your health care provider to maintain  a healthy body weight or to lose weight. Ask what an ideal weight is for you.  Get at least 30 minutes of exercise that causes your heart to beat faster (aerobic exercise) most days of the week. Activities may include walking, swimming, or biking.  Work with your health care provider or dietitian to adjust your eating plan to your individual calorie needs. What foods should I eat? Fruits All fresh, dried, or frozen fruit. Canned fruit in natural juice (without added sugar). Vegetables Fresh or frozen vegetables (raw, steamed, roasted, or grilled). Low-sodium or reduced-sodium tomato and vegetable  juice. Low-sodium or reduced-sodium tomato sauce and tomato paste. Low-sodium or reduced-sodium canned vegetables. Grains Whole-grain or whole-wheat bread. Whole-grain or whole-wheat pasta. Brown rice. [place]. Bulgur. Whole-grain and low-sodium cereals. Pita bread. Low-fat, low-sodium crackers. Whole-wheat flour tortillas. Meats and other proteins Skinless chicken or [place]. Ground chicken or [place]. Pork with fat trimmed off. Fish and seafood. Egg whites. Dried beans, peas, or lentils. Unsalted nuts, nut butters, and seeds. Unsalted canned beans. Lean cuts of beef with fat trimmed off. Low-sodium, lean precooked or cured meat, such as sausages or meat loaves. Dairy Low-fat (1%) or fat-free (skim) milk. Reduced-fat, low-fat, or fat-free cheeses. Nonfat, low-sodium ricotta or cottage cheese. Low-fat or nonfat yogurt. Low-fat, low-sodium cheese. Fats and oils Soft margarine without trans fats. Vegetable oil. Reduced-fat, low-fat, or light mayonnaise and salad dressings (reduced-sodium). Canola, safflower, olive, avocado, soybean, and sunflower oils. Avocado. Seasonings and condiments Herbs. Spices. Seasoning mixes without salt. Other foods Unsalted popcorn and pretzels. Fat-free sweets. The items listed above may not be a complete list of foods and beverages you can eat. Contact a dietitian for more information. What foods should I avoid? Fruits Canned fruit in a light or heavy syrup. Fried fruit. Fruit in cream or butter sauce. Vegetables Creamed or fried vegetables. Vegetables in a cheese sauce. Regular canned vegetables (not low-sodium or reduced-sodium). Regular canned tomato sauce and paste (not low-sodium or reduced-sodium). Regular tomato and vegetable juice (not low-sodium or reduced-sodium). Angie Fava. Olives. Grains Baked goods made with fat, such as croissants, muffins, or some breads. Dry pasta or rice meal packs. Meats and other proteins Fatty cuts of meat. Ribs. Fried meat.  Berniece Salines. Bologna, salami, and other precooked or cured meats, such as sausages or meat loaves. Fat from the back of a pig (fatback). Bratwurst. Salted nuts and seeds. Canned beans with added salt. Canned or smoked fish. Whole eggs or egg yolks. Chicken or [place] with skin. Dairy Whole or 2% milk, cream, and half-and-half. Whole or full-fat cream cheese. Whole-fat or sweetened yogurt. Full-fat cheese. Nondairy creamers. Whipped toppings. Processed cheese and cheese spreads. Fats and oils Butter. Stick margarine. Lard. Shortening. Ghee. Bacon fat. Tropical oils, such as coconut, palm kernel, or palm oil. Seasonings and condiments Onion salt, garlic salt, seasoned salt, table salt, and sea salt. Worcestershire sauce. Tartar sauce. Barbecue sauce. Teriyaki sauce. Soy sauce, including reduced-sodium. Steak sauce. Canned and packaged gravies. Fish sauce. Oyster sauce. Cocktail sauce. Store-bought horseradish. Ketchup. Mustard. Meat flavorings and tenderizers. Bouillon cubes. Hot sauces. Pre-made or packaged marinades. Pre-made or packaged taco seasonings. Relishes. Regular salad dressings. Other foods Salted popcorn and pretzels. The items listed above may not be a complete list of foods and beverages you should avoid. Contact a dietitian for more information. Where to find more information  National Heart, Lung, and Blood Institute: https://wilson-eaton.com/  American Heart Association: www.heart.org  Academy of Nutrition and Dietetics: www.eatright.[place]: www.kidney.org Summary  The DASH eating plan is a healthy eating plan that has been shown to reduce high blood pressure (hypertension). It may also reduce your risk for type 2 diabetes, heart disease, and stroke.  When on the DASH eating plan, aim to eat more fresh fruits and vegetables, whole grains, lean proteins, low-fat dairy, and heart-healthy fats.  With the DASH eating plan, you should limit salt (sodium) intake to 2,300  mg a day. If you have hypertension, you may need to reduce your sodium intake to 1,500 mg a day.  Work with your health care provider or dietitian to adjust your eating plan to your individual calorie needs. This information is not intended to replace advice given to you by your health care provider. Make sure you discuss any questions you have with your health care provider. Document Revised: 09/20/2019 Document Reviewed: 09/20/2019 Elsevier Patient Education  2021 [employer].

## 2021-03-16 LAB — COMPREHENSIVE METABOLIC PANEL
AG Ratio: 1.8 (calc) (ref 1.0–2.5)
ALT: 13 U/L (ref 6–29)
AST: 26 U/L (ref 10–35)
Albumin: 4.2 g/dL (ref 3.6–5.1)
Alkaline phosphatase (APISO): 77 U/L (ref 37–153)
BUN: 17 mg/dL (ref 7–25)
CO2: 28 mmol/L (ref 20–32)
Calcium: 10 mg/dL (ref 8.6–10.4)
Chloride: 109 mmol/L (ref 98–110)
Creat: 0.9 mg/dL (ref 0.60–0.93)
Globulin: 2.3 g/dL (calc) (ref 1.9–3.7)
Glucose, Bld: 92 mg/dL (ref 65–99)
Potassium: 3.9 mmol/L (ref 3.5–5.3)
Sodium: 145 mmol/L (ref 135–146)
Total Bilirubin: 0.4 mg/dL (ref 0.2–1.2)
Total Protein: 6.5 g/dL (ref 6.1–8.1)

## 2021-03-16 LAB — LIPID PANEL
Cholesterol: 210 mg/dL — ABNORMAL HIGH (ref <200)
HDL: 90 mg/dL (ref >=50)
LDL Cholesterol (Calc): 106 mg/dL (calc) — ABNORMAL HIGH
Non-HDL Cholesterol (Calc): 120 mg/dL (calc) (ref <130)
Total CHOL/HDL Ratio: 2.3 (calc) (ref <5.0)
Triglycerides: 62 mg/dL (ref <150)

## 2021-03-16 LAB — CBC WITH DIFFERENTIAL/PLATELET
Absolute Monocytes: 201 cells/uL (ref 200–950)
Basophils Absolute: 30 cells/uL (ref 0–200)
Basophils Relative: 0.9 %
Eosinophils Absolute: 59 cells/uL (ref 15–500)
Eosinophils Relative: 1.8 %
HCT: 35 % (ref 35.0–45.0)
Hemoglobin: 10.9 g/dL — ABNORMAL LOW (ref 11.7–15.5)
Lymphs Abs: 1601 cells/uL (ref 850–3900)
MCH: 30.1 pg (ref 27.0–33.0)
MCHC: 31.1 g/dL — ABNORMAL LOW (ref 32.0–36.0)
MCV: 96.7 fL (ref 80.0–100.0)
MPV: 12.4 fL (ref 7.5–12.5)
Monocytes Relative: 6.1 %
Neutro Abs: 1409 cells/uL — ABNORMAL LOW (ref 1500–7800)
Neutrophils Relative %: 42.7 %
Platelets: 234 10*3/uL (ref 140–400)
RBC: 3.62 10*6/uL — ABNORMAL LOW (ref 3.80–5.10)
RDW: 11.4 % (ref 11.0–15.0)
Total Lymphocyte: 48.5 %
WBC: 3.3 10*3/uL — ABNORMAL LOW (ref 3.8–10.8)

## 2021-03-16 LAB — TSH: TSH: 0.78 mIU/L (ref 0.40–4.50)

## 2021-04-09 ENCOUNTER — Other Ambulatory Visit: Payer: Self-pay | Admitting: Nurse Practitioner

## 2021-04-09 DIAGNOSIS — M542 Cervicalgia: Secondary | ICD-10-CM

## 2021-04-09 DIAGNOSIS — S134XXD Sprain of ligaments of cervical spine, subsequent encounter: Secondary | ICD-10-CM

## 2021-04-21 ENCOUNTER — Ambulatory Visit: Payer: Medicare HMO | Admitting: Cardiology

## 2021-04-28 DIAGNOSIS — N3001 Acute cystitis with hematuria: Secondary | ICD-10-CM | POA: Diagnosis not present

## 2021-05-07 ENCOUNTER — Other Ambulatory Visit: Payer: Self-pay | Admitting: Nurse Practitioner

## 2021-05-07 ENCOUNTER — Other Ambulatory Visit: Payer: Self-pay | Admitting: Pediatrics

## 2021-05-07 DIAGNOSIS — Z1231 Encounter for screening mammogram for malignant neoplasm of breast: Secondary | ICD-10-CM

## 2021-05-10 ENCOUNTER — Other Ambulatory Visit: Payer: Self-pay | Admitting: *Deleted

## 2021-05-10 DIAGNOSIS — M545 Low back pain, unspecified: Secondary | ICD-10-CM

## 2021-05-10 DIAGNOSIS — M159 Polyosteoarthritis, unspecified: Secondary | ICD-10-CM

## 2021-05-10 DIAGNOSIS — M8949 Other hypertrophic osteoarthropathy, multiple sites: Secondary | ICD-10-CM

## 2021-05-10 DIAGNOSIS — S134XXD Sprain of ligaments of cervical spine, subsequent encounter: Secondary | ICD-10-CM

## 2021-05-10 DIAGNOSIS — G8929 Other chronic pain: Secondary | ICD-10-CM

## 2021-05-10 DIAGNOSIS — M542 Cervicalgia: Secondary | ICD-10-CM

## 2021-05-10 MED ORDER — TRAMADOL HCL 50 MG PO TABS: ORAL_TABLET | ORAL | 0 refills | Status: DC

## 2021-05-10 NOTE — Telephone Encounter (Signed)
Patient daughter requested refill Epic LR: 03/15/2021 Contract on [employer] [facility] and sent to [place] for approval.

## 2021-05-12 ENCOUNTER — Ambulatory Visit
Admission: RE | Admit: 2021-05-12 | Discharge: 2021-05-12 | Disposition: A | Discharge status: Home / Self Care | Payer: Medicare HMO | Source: Ambulatory Visit | Attending: Nurse Practitioner | Admitting: Nurse Practitioner

## 2021-05-12 ENCOUNTER — Other Ambulatory Visit: Payer: Self-pay

## 2021-05-12 DIAGNOSIS — Z1231 Encounter for screening mammogram for malignant neoplasm of breast: Secondary | ICD-10-CM

## 2021-05-23 DIAGNOSIS — R059 Cough, unspecified: Secondary | ICD-10-CM | POA: Diagnosis not present

## 2021-05-23 DIAGNOSIS — R0981 Nasal congestion: Secondary | ICD-10-CM | POA: Diagnosis not present

## 2021-05-23 DIAGNOSIS — J189 Pneumonia, unspecified organism: Secondary | ICD-10-CM | POA: Diagnosis not present

## 2021-05-28 ENCOUNTER — Other Ambulatory Visit: Payer: Self-pay | Admitting: Nurse Practitioner

## 2021-05-31 ENCOUNTER — Other Ambulatory Visit: Payer: Self-pay | Admitting: Nurse Practitioner

## 2021-05-31 DIAGNOSIS — M159 Polyosteoarthritis, unspecified: Secondary | ICD-10-CM

## 2021-05-31 DIAGNOSIS — M8949 Other hypertrophic osteoarthropathy, multiple sites: Secondary | ICD-10-CM

## 2021-06-15 DIAGNOSIS — H25013 Cortical age-related cataract, bilateral: Secondary | ICD-10-CM | POA: Diagnosis not present

## 2021-06-15 DIAGNOSIS — H524 Presbyopia: Secondary | ICD-10-CM | POA: Diagnosis not present

## 2021-06-15 DIAGNOSIS — H5203 Hypermetropia, bilateral: Secondary | ICD-10-CM | POA: Diagnosis not present

## 2021-06-15 DIAGNOSIS — H2513 Age-related nuclear cataract, bilateral: Secondary | ICD-10-CM | POA: Diagnosis not present

## 2021-06-16 ENCOUNTER — Other Ambulatory Visit: Payer: Self-pay

## 2021-06-16 ENCOUNTER — Ambulatory Visit (INDEPENDENT_AMBULATORY_CARE_PROVIDER_SITE_OTHER): Payer: Medicare HMO | Admitting: Nurse Practitioner

## 2021-06-16 ENCOUNTER — Encounter: Payer: Self-pay | Admitting: Nurse Practitioner

## 2021-06-16 VITALS — BP 124/70 | HR 58 | Temp 97.8°F | Ht 61.0 in | Wt 161.0 lb

## 2021-06-16 DIAGNOSIS — G2581 Restless legs syndrome: Secondary | ICD-10-CM | POA: Diagnosis not present

## 2021-06-16 DIAGNOSIS — E7800 Pure hypercholesterolemia, unspecified: Secondary | ICD-10-CM

## 2021-06-16 DIAGNOSIS — G8929 Other chronic pain: Secondary | ICD-10-CM | POA: Diagnosis not present

## 2021-06-16 DIAGNOSIS — H6121 Impacted cerumen, right ear: Secondary | ICD-10-CM

## 2021-06-16 DIAGNOSIS — M545 Low back pain, unspecified: Secondary | ICD-10-CM | POA: Diagnosis not present

## 2021-06-16 DIAGNOSIS — M8949 Other hypertrophic osteoarthropathy, multiple sites: Secondary | ICD-10-CM

## 2021-06-16 DIAGNOSIS — D649 Anemia, unspecified: Secondary | ICD-10-CM

## 2021-06-16 DIAGNOSIS — K219 Gastro-esophageal reflux disease without esophagitis: Secondary | ICD-10-CM

## 2021-06-16 DIAGNOSIS — I10 Essential (primary) hypertension: Secondary | ICD-10-CM | POA: Diagnosis not present

## 2021-06-16 DIAGNOSIS — R600 Localized edema: Secondary | ICD-10-CM

## 2021-06-16 DIAGNOSIS — R7989 Other specified abnormal findings of blood chemistry: Secondary | ICD-10-CM | POA: Diagnosis not present

## 2021-06-16 DIAGNOSIS — M159 Polyosteoarthritis, unspecified: Secondary | ICD-10-CM

## 2021-06-16 MED ORDER — TRAMADOL HCL 50 MG PO TABS: ORAL_TABLET | ORAL | 0 refills | Status: DC

## 2021-06-16 NOTE — Progress Notes (Signed)
[name]: Patient Care Team: [name], NP as PCP - General (Nurse Practitioner)  PLACE OF SERVICE:  [place] Directive information Does Patient Have a Medical Advance Directive?: Yes, Type of Advance Directive: [place];Out of facility DNR (pink MOST or yellow form), Pre-existing out of facility DNR order (yellow form or pink MOST form): Pink MOST form placed in chart (order not valid for inpatient use), Does patient want to make changes to medical advance directive?: No - Patient declined  No Known Allergies  Chief Complaint  Patient presents with   Medical Management of Chronic Issues    3 month follow-up and discuss need for covid and flu vaccine or exclude.      HPI: Patient is a 75 y.o. female for routine follow up.   Tested positive for COVID on July 3rd, did well with paxlovid. No side effects noted.   Htn- well controlled.   OA-   Constipation- controlled on linzess.   Having cataracts removed.   RLS- controlled on ropiniprole,    Review of Systems:  Review of Systems  Constitutional:  Negative for chills, fever and weight loss.  HENT:  Negative for tinnitus.   Respiratory:  Negative for cough, sputum production and shortness of breath.   Cardiovascular:  Negative for chest pain, palpitations and leg swelling.  Gastrointestinal:  Negative for abdominal pain, constipation, diarrhea and heartburn.  Genitourinary:  Negative for dysuria, frequency and urgency.  Musculoskeletal:  Positive for back pain, joint pain and myalgias. Negative for falls.  Skin: Negative.   Neurological:  Negative for dizziness and headaches.  Psychiatric/Behavioral:  Negative for depression and memory loss. The patient does not have insomnia.    Past Medical History:  Diagnosis Date   Allergic rhinitis    Anxiety    Arthritis    Atrophic vaginitis 06/25/2019   Breast screening 10/26/2010   Last Assessment & Plan:  Formatting of this note  might be different from the original. Will order a mammogram for this fall;  Follow up with me around Thanksgiving.   Callus of heel 02/19/2013   Colon polyps    adenomatous   Constipation 08/20/2013   Corns and callosities    Diverticulosis    Dysuria 06/03/2011   Last Assessment & Plan:  Formatting of this note might be different from the original. New, acute complaint today;  Will check urinalysis with her labs today;   GERD (gastroesophageal reflux disease)    Glaucoma    H/O total hysterectomy 02/19/2013   Hallux valgus 10/26/2010   Last Assessment & Plan:  Formatting of this note might be different from the original. Had surgery with Dr Jomarie Longs back in October;   Will have her follow up with him.   Heartburn 05/03/813   Helicobacter pylori gastritis 08/12/2014   Hiatal hernia with gastroesophageal reflux 12/23/2014   Hypercholesteremia    no meds   Hypercholesterolemia 06/03/2011   Overview:  TC 205 (elevated) in 2010  Last Assessment & Plan:  Formatting of this note might be different from the original. Lab Results  Component Value Date   CHOL 205 12/09/2011   CHOL 233 06/03/2011   CHOL 205 05/26/2009   Lab Results  Component Value Date   HDL 82 12/09/2011   HDL 90 06/03/2011   HDL 75 05/26/2009   Lab Results  Component Value Date   LDLCALC 113 12/09/2011   [place] 133 06/03/2011   LDLCA   Hypothyroidism  Left-sided low back pain with left-sided sciatica 08/12/2014   Neck pain 10/26/2010   Obesity    Osteoarthritis 10/26/2010   Osteopenia 01/08/2014   Overweight 10/26/2010   Postmenopausal estrogen deficiency 02/19/2013   Recurrent UTI 06/25/2019   Restless leg syndrome 08/20/2013   RLS (restless legs syndrome) 07/08/2014   Routine general medical examination at a health care facility 02/19/2013   S/P carpal tunnel release 10/26/2010   Screen for colon cancer 12/16/2011   Last Assessment & Plan:  Formatting of this note might be different from the original. Refer to GI;   Screening for diabetes  mellitus    Shoulder pain, right 12/16/2011   Last Assessment & Plan:  Formatting of this note might be different from the original. We have tried physical therapy;   We shot and reviewed films today;  She has  1)  Osteopenia 2)  Small spur at humeral head 3)  Some hypertrophic/lipping changes at the inferior glenoid rim;   I also injected it last visit;  She is on NSAIDS and tylenol;  Will trial voltaren gel;  Then get ortho opinion;   Skin spots-aging 07/08/2014   Urge incontinence    Whiplash injury 06/26/2018   Past Surgical History:  Procedure Laterality Date   ABDOMINAL HYSTERECTOMY  1985   Dr Candace Gallus Right    Dr Jomarie Longs   CARPAL TUNNEL RELEASE Bilateral 2010   Dr Laurance Flatten   CESAREAN SECTION  1983   1 time   COLONOSCOPY     ROTATOR CUFF REPAIR Right    Dr Theda Sers   THYROIDECTOMY  1987   Dr Guadlupe Spanish   TONSILLECTOMY  1951   Social History:   reports that she quit smoking about 34 years ago. Her smoking use included cigarettes. She has never used smokeless tobacco. She reports that she does not drink alcohol and does not use drugs.  Family History  Problem Relation Age of Onset   Leukemia Mother 13   Lung cancer Father    Diabetes Brother    COPD Brother    Lung cancer Brother    Diabetes Maternal Grandmother    Cancer Maternal Grandfather        unknown type   Breast cancer Maternal Aunt    Colon polyps Neg Hx    Rectal cancer Neg Hx     Medications: Patient's Medications  New Prescriptions   No medications on file  Previous Medications   ACETAMINOPHEN (TYLENOL) 500 MG TABLET    Take 1,000 mg by mouth every 8 (eight) hours as needed for mild pain.   ASCORBIC ACID (VITAMIN C PO)    Take 1 tablet by mouth daily.   CALCIUM-VITAMIN D PO    Take 2 tablets by mouth daily.    CRANBERRY PO    Take 1 tablet by mouth daily.   DICLOFENAC SODIUM (VOLTAREN) 1 % GEL    APPLY 2 GRAMS TOPICALLY 4 (FOUR) TIMES DAILY AS NEEDED.   HYDROCHLOROTHIAZIDE (HYDRODIURIL) 12.5 MG  TABLET    Take 0.5 tablets (6.25 mg total) by mouth daily.   HYDROXYPROPYL METHYLCELLULOSE / HYPROMELLOSE (ISOPTO TEARS / GONIOVISC) 2.5 % OPHTHALMIC SOLUTION    Place 1 drop into both eyes as needed for dry eyes.    LEVOTHYROXINE (SYNTHROID) 25 MCG TABLET    TAKE 1 TABLET BY MOUTH DAILY BEFORE BREAKFAST.   LINACLOTIDE (LINZESS) 145 MCG CAPS CAPSULE    Take 1 capsule (145 mcg total) by mouth daily before breakfast. Office visit  for further refills   MELOXICAM (MOBIC) 7.5 MG TABLET    TAKE 1 TABLET BY MOUTH DAILY AS NEEDED FOR PAIN   MULTIPLE VITAMIN (MULTIVITAMIN WITH MINERALS) TABS    Take 1 tablet by mouth daily.   OMEPRAZOLE (PRILOSEC) 40 MG CAPSULE    TAKE 1 CAPSULE BY MOUTH EVERY DAY   ROPINIROLE (REQUIP) 0.25 MG TABLET    TAKE 1 TABLET BY MOUTH EVERY DAY AFTER SUPPER. TAKE 1-2 HRS BEFORE GOING TO BEDTIME   TRAMADOL (ULTRAM) 50 MG TABLET    Take one tablet by mouth every 6 hours as needed for pain  Modified Medications   No medications on file  Discontinued Medications   No medications on file    Physical Exam:  Vitals:   06/16/21 0946  BP: 124/70  Pulse: (!) 58  Temp: 97.8 F (36.6 C)  TempSrc: Temporal  SpO2: 96%  Weight: 161 lb (73 kg)  Height: 5' 1" (1.549 m)   Body mass index is 30.42 kg/m. Wt Readings from Last 3 Encounters:  06/16/21 161 lb (73 kg)  03/15/21 164 lb (74.4 kg)  11/09/20 174 lb (78.9 kg)    Physical Exam Constitutional:      General: She is not in acute distress.    Appearance: She is well-developed. She is not diaphoretic.  HENT:     Head: Normocephalic and atraumatic.     Mouth/Throat:     Pharynx: No oropharyngeal exudate.  Eyes:     Conjunctiva/sclera: Conjunctivae normal.     Pupils: Pupils are equal, round, and reactive to light.  Cardiovascular:     Rate and Rhythm: Normal rate and regular rhythm.     Heart sounds: Normal heart sounds.  Pulmonary:     Effort: Pulmonary effort is normal.     Breath sounds: Normal breath sounds.   Abdominal:     General: Bowel sounds are normal.     Palpations: Abdomen is soft.  Musculoskeletal:     Cervical back: Normal range of motion and neck supple.     Right lower leg: No edema.     Left lower leg: No edema.  Skin:    General: Skin is warm and dry.  Neurological:     Mental Status: She is alert.  Psychiatric:        Mood and Affect: Mood normal.    Labs reviewed: Basic Metabolic Panel: Recent Labs    09/14/20 1102 10/14/20 1203 03/15/21 0938  NA 142 143 145  K 4.1 3.9 3.9  CL 107 107 109  CO2 _0 GLUCOSE 104* 100 92  BUN _1 CREATININE 0.87 0.90 0.90  CALCIUM 9.5 9.4 10.0  TSH 0.38*  --  0.78   Liver Function Tests: Recent Labs    09/14/20 1102 03/15/21 0938  AST 25 26  ALT 11 13  BILITOT 0.4 0.4  PROT 6.7 6.5   No results for input(s): LIPASE, AMYLASE in the last 8760 hours. No results for input(s): AMMONIA in the last 8760 hours. CBC: Recent Labs    09/14/20 1102 03/15/21 0938  WBC 3.6* 3.3*  NEUTROABS 1,508 1,409*  HGB 12.1 10.9*  HCT 37.7 35.0  MCV 94.5 96.7  PLT 243 234   Lipid Panel: Recent Labs    03/15/21 0938  CHOL 210*  HDL 90  LDLCALC 106*  TRIG 62  CHOLHDL 2.3   TSH: Recent Labs    09/14/20 1102 03/15/21 0938  TSH 0.38* 0.78  A1C: Lab Results  Component Value Date   HGBA1C 5.6 07/05/2014     Assessment/Plan 1. Primary osteoarthritis involving multiple joints -ongoing, pain controlled with tylenol, muscle rubs, mobic and tramadol.  - CMP with eGFR([facility]) - traMADol (ULTRAM) 50 MG tablet; Take one tablet by mouth every 6 hours as needed for pain  Dispense: 90 tablet; Refill: 0  2. Hypercholesterolemia --continues with dietary modification. LDL at goal.   3. Primary hypertension --stable. Goal bp <140/90. Continue on hctz with low sodium diet.   - CBC with Differential/Platelet  4. Localized edema Stable, on hctz daily which has been beneficial.  - CMP with eGFR([facility])  5. Anemia,  unspecified type -no signs of blood loss. Will follow up cbc  6. Restless leg syndrome -controlled on requip  7. Gastroesophageal reflux disease without esophagitis The current medical regimen is effective;  continue present plan and medications.  8. Chronic left-sided low back pain without sciatica -stable at this time will continue current regimen.  - traMADol (ULTRAM) 50 MG tablet; Take one tablet by mouth every 6 hours as needed for pain  Dispense: 90 tablet; Refill: 0  9. Impacted cerumen of right ear -cerumen removed via lavage and grabber. Pt tolerate well   Next appt: 6 months. Carlos American. [place], [facility] [phone]

## 2021-06-17 ENCOUNTER — Other Ambulatory Visit: Payer: Self-pay

## 2021-06-17 DIAGNOSIS — K869 Disease of pancreas, unspecified: Secondary | ICD-10-CM

## 2021-06-18 LAB — CBC WITH DIFFERENTIAL/PLATELET
Absolute Monocytes: 221 cells/uL (ref 200–950)
Basophils Absolute: 19 cells/uL (ref 0–200)
Basophils Relative: 0.6 %
Eosinophils Absolute: 29 cells/uL (ref 15–500)
Eosinophils Relative: 0.9 %
HCT: 34.6 % — ABNORMAL LOW (ref 35.0–45.0)
Hemoglobin: 11 g/dL — ABNORMAL LOW (ref 11.7–15.5)
Lymphs Abs: 1766 cells/uL (ref 850–3900)
MCH: 30.5 pg (ref 27.0–33.0)
MCHC: 31.8 g/dL — ABNORMAL LOW (ref 32.0–36.0)
MCV: 95.8 fL (ref 80.0–100.0)
MPV: 12.4 fL (ref 7.5–12.5)
Monocytes Relative: 6.9 %
Neutro Abs: 1165 cells/uL — ABNORMAL LOW (ref 1500–7800)
Neutrophils Relative %: 36.4 %
Platelets: 286 10*3/uL (ref 140–400)
RBC: 3.61 10*6/uL — ABNORMAL LOW (ref 3.80–5.10)
RDW: 12.1 % (ref 11.0–15.0)
Total Lymphocyte: 55.2 %
WBC: 3.2 10*3/uL — ABNORMAL LOW (ref 3.8–10.8)

## 2021-06-18 LAB — COMPLETE METABOLIC PANEL WITH GFR
AG Ratio: 1.6 (calc) (ref 1.0–2.5)
ALT: 13 U/L (ref 6–29)
AST: 26 U/L (ref 10–35)
Albumin: 4 g/dL (ref 3.6–5.1)
Alkaline phosphatase (APISO): 91 U/L (ref 37–153)
BUN: 18 mg/dL (ref 7–25)
CO2: 29 mmol/L (ref 20–32)
Calcium: 9.5 mg/dL (ref 8.6–10.4)
Chloride: 108 mmol/L (ref 98–110)
Creat: 0.92 mg/dL (ref 0.60–1.00)
Globulin: 2.5 g/dL (calc) (ref 1.9–3.7)
Glucose, Bld: 90 mg/dL (ref 65–99)
Potassium: 4.4 mmol/L (ref 3.5–5.3)
Sodium: 144 mmol/L (ref 135–146)
Total Bilirubin: 0.4 mg/dL (ref 0.2–1.2)
Total Protein: 6.5 g/dL (ref 6.1–8.1)
eGFR: 64 mL/min/{1.73_m2} (ref >=60)

## 2021-06-18 LAB — TEST AUTHORIZATION

## 2021-06-18 LAB — IRON,TIBC AND FERRITIN PANEL
%SAT: 23 % (calc) (ref 16–45)
Ferritin: 102 ng/mL (ref 16–288)
Iron: 73 ug/dL (ref 45–160)
TIBC: 316 mcg/dL (calc) (ref 250–450)

## 2021-06-30 DIAGNOSIS — H25812 Combined forms of age-related cataract, left eye: Secondary | ICD-10-CM | POA: Diagnosis not present

## 2021-06-30 DIAGNOSIS — H25813 Combined forms of age-related cataract, bilateral: Secondary | ICD-10-CM | POA: Diagnosis not present

## 2021-06-30 DIAGNOSIS — H2511 Age-related nuclear cataract, right eye: Secondary | ICD-10-CM | POA: Diagnosis not present

## 2021-07-04 ENCOUNTER — Other Ambulatory Visit: Payer: Self-pay | Admitting: Nurse Practitioner

## 2021-07-08 ENCOUNTER — Other Ambulatory Visit: Payer: Self-pay | Admitting: Nurse Practitioner

## 2021-07-08 DIAGNOSIS — M542 Cervicalgia: Secondary | ICD-10-CM

## 2021-07-08 DIAGNOSIS — S134XXD Sprain of ligaments of cervical spine, subsequent encounter: Secondary | ICD-10-CM

## 2021-07-10 ENCOUNTER — Other Ambulatory Visit: Payer: Self-pay | Admitting: Nurse Practitioner

## 2021-07-12 ENCOUNTER — Other Ambulatory Visit: Payer: Self-pay

## 2021-07-12 ENCOUNTER — Ambulatory Visit (HOSPITAL_COMMUNITY)
Admission: RE | Admit: 2021-07-12 | Discharge: 2021-07-12 | Disposition: A | Discharge status: Home / Self Care | Payer: Medicare HMO | Source: Ambulatory Visit | Attending: Internal Medicine | Admitting: Internal Medicine

## 2021-07-12 ENCOUNTER — Other Ambulatory Visit: Payer: Self-pay | Admitting: Internal Medicine

## 2021-07-12 DIAGNOSIS — K869 Disease of pancreas, unspecified: Secondary | ICD-10-CM | POA: Insufficient documentation

## 2021-07-12 DIAGNOSIS — K862 Cyst of pancreas: Secondary | ICD-10-CM | POA: Diagnosis not present

## 2021-07-12 MED ORDER — GADOBUTROL 1 MMOL/ML IV SOLN
7.5000 mL | Freq: Once | INTRAVENOUS | Status: AC | PRN
  Administered 2021-07-12: 7.5 mL via INTRAVENOUS

## 2021-07-14 DIAGNOSIS — H25012 Cortical age-related cataract, left eye: Secondary | ICD-10-CM | POA: Diagnosis not present

## 2021-07-14 DIAGNOSIS — H2512 Age-related nuclear cataract, left eye: Secondary | ICD-10-CM | POA: Diagnosis not present

## 2021-07-26 ENCOUNTER — Other Ambulatory Visit: Payer: Self-pay | Admitting: Nurse Practitioner

## 2021-07-26 DIAGNOSIS — M159 Polyosteoarthritis, unspecified: Secondary | ICD-10-CM

## 2021-07-26 DIAGNOSIS — M8949 Other hypertrophic osteoarthropathy, multiple sites: Secondary | ICD-10-CM

## 2021-08-05 ENCOUNTER — Other Ambulatory Visit: Payer: Self-pay

## 2021-08-05 DIAGNOSIS — M545 Low back pain, unspecified: Secondary | ICD-10-CM

## 2021-08-05 DIAGNOSIS — G8929 Other chronic pain: Secondary | ICD-10-CM

## 2021-08-05 DIAGNOSIS — M159 Polyosteoarthritis, unspecified: Secondary | ICD-10-CM

## 2021-08-05 DIAGNOSIS — M150 Primary generalized (osteo)arthritis: Secondary | ICD-10-CM

## 2021-08-05 MED ORDER — TRAMADOL HCL 50 MG PO TABS: ORAL_TABLET | ORAL | 0 refills | Status: DC

## 2021-09-01 DIAGNOSIS — J209 Acute bronchitis, unspecified: Secondary | ICD-10-CM | POA: Diagnosis not present

## 2021-09-01 DIAGNOSIS — Z20828 Contact with and (suspected) exposure to other viral communicable diseases: Secondary | ICD-10-CM | POA: Diagnosis not present

## 2021-09-14 ENCOUNTER — Encounter: Payer: Self-pay | Admitting: Nurse Practitioner

## 2021-09-14 ENCOUNTER — Other Ambulatory Visit: Payer: Self-pay

## 2021-09-14 ENCOUNTER — Telehealth (INDEPENDENT_AMBULATORY_CARE_PROVIDER_SITE_OTHER): Payer: Medicare HMO | Admitting: Nurse Practitioner

## 2021-09-14 DIAGNOSIS — I77810 Thoracic aortic ectasia: Secondary | ICD-10-CM | POA: Diagnosis not present

## 2021-09-14 NOTE — Progress Notes (Signed)
[name]: Patient Care Team: [name], NP as PCP - General (Nurse Practitioner)  Advanced Directive information    No Known Allergies  Chief Complaint  Patient presents with   Acute Visit    Patient has concerns about chest xray results. She would like to discuss them with provider. She states that there are some new findings that are concerning.      HPI: Patient is a 75 y.o. female due to abnormal chest xray  A few weeks ago she had a cold but cough and congestion continue to linger so her daughter took her to urgent care for chest xray to rule out pneumonia and noted a new vascular finding that was not there in July. No chest pains, shortness of breath Cough and congestion has improved.   Review of Systems:  Review of Systems  Constitutional:  Negative for chills, fever and weight loss.  HENT:  Negative for tinnitus.   Respiratory:  Negative for cough, sputum production and shortness of breath.   Cardiovascular:  Negative for chest pain, palpitations and leg swelling.  Gastrointestinal:  Negative for abdominal pain, constipation, diarrhea and heartburn.  Genitourinary:  Negative for dysuria, frequency and urgency.  Musculoskeletal:  Negative for back pain, falls, joint pain and myalgias.  Skin: Negative.   Neurological:  Negative for dizziness and headaches.  Psychiatric/Behavioral:  Negative for depression and memory loss. The patient does not have insomnia.    Past Medical History:  Diagnosis Date   Allergic rhinitis    Anxiety    Arthritis    Atrophic vaginitis 06/25/2019   Breast screening 10/26/2010   Last Assessment & Plan:  Formatting of this note might be different from the original. Will order a mammogram for this fall;  Follow up with me around Thanksgiving.   Callus of heel 02/19/2013   Colon polyps    adenomatous   Constipation 08/20/2013   Corns and callosities    Diverticulosis    Dysuria 06/03/2011   Last Assessment & Plan:  Formatting  of this note might be different from the original. New, acute complaint today;  Will check urinalysis with her labs today;   GERD (gastroesophageal reflux disease)    Glaucoma    H/O total hysterectomy 02/19/2013   Hallux valgus 10/26/2010   Last Assessment & Plan:  Formatting of this note might be different from the original. Had surgery with Dr Jomarie Longs back in October;   Will have her follow up with him.   Heartburn 05/03/813   Helicobacter pylori gastritis 08/12/2014   Hiatal hernia with gastroesophageal reflux 12/23/2014   Hypercholesteremia    no meds   Hypercholesterolemia 06/03/2011   Overview:  TC 205 (elevated) in 2010  Last Assessment & Plan:  Formatting of this note might be different from the original. Lab Results  Component Value Date   CHOL 205 12/09/2011   CHOL 233 06/03/2011   CHOL 205 05/26/2009   Lab Results  Component Value Date   HDL 82 12/09/2011   HDL 90 06/03/2011   HDL 75 05/26/2009   Lab Results  Component Value Date   LDLCALC 113 12/09/2011   [place] 133 06/03/2011   LDLCA   Hypothyroidism    Left-sided low back pain with left-sided sciatica 08/12/2014   Neck pain 10/26/2010   Obesity    Osteoarthritis 10/26/2010   Osteopenia 01/08/2014   Overweight 10/26/2010   Postmenopausal estrogen deficiency 02/19/2013   Recurrent UTI 06/25/2019   Restless leg syndrome 08/20/2013  RLS (restless legs syndrome) 07/08/2014   Routine general medical examination at a health care facility 02/19/2013   S/P carpal tunnel release 10/26/2010   Screen for colon cancer 12/16/2011   Last Assessment & Plan:  Formatting of this note might be different from the original. Refer to GI;   Screening for diabetes mellitus    Shoulder pain, right 12/16/2011   Last Assessment & Plan:  Formatting of this note might be different from the original. We have tried physical therapy;   We shot and reviewed films today;  She has  1)  Osteopenia 2)  Small spur at humeral head 3)  Some hypertrophic/lipping changes at the inferior  glenoid rim;   I also injected it last visit;  She is on NSAIDS and tylenol;  Will trial voltaren gel;  Then get ortho opinion;   Skin spots-aging 07/08/2014   Urge incontinence    Whiplash injury 06/26/2018   Past Surgical History:  Procedure Laterality Date   ABDOMINAL HYSTERECTOMY  1985   Dr Candace Gallus Right    Dr Jomarie Longs   CARPAL TUNNEL RELEASE Bilateral 2010   Dr Laurance Flatten   CESAREAN SECTION  1983   1 time   COLONOSCOPY     ROTATOR CUFF REPAIR Right    Dr Theda Sers   THYROIDECTOMY  1987   Dr Guadlupe Spanish   TONSILLECTOMY  1951   Social History:   reports that she quit smoking about 34 years ago. Her smoking use included cigarettes. She has never used smokeless tobacco. She reports that she does not drink alcohol and does not use drugs.  Family History  Problem Relation Age of Onset   Leukemia Mother 79   Lung cancer Father    Diabetes Brother    COPD Brother    Lung cancer Brother    Diabetes Maternal Grandmother    Cancer Maternal Grandfather        unknown type   Breast cancer Maternal Aunt    Colon polyps Neg Hx    Rectal cancer Neg Hx     Medications: Patient's Medications  New Prescriptions   No medications on file  Previous Medications   ACETAMINOPHEN (TYLENOL) 500 MG TABLET    Take 1,000 mg by mouth every 8 (eight) hours as needed for mild pain.   ASCORBIC ACID (VITAMIN C PO)    Take 1 tablet by mouth daily.   CALCIUM-VITAMIN D PO    Take 2 tablets by mouth daily.    CRANBERRY PO    Take 1 tablet by mouth daily.   DICLOFENAC SODIUM (VOLTAREN) 1 % GEL    APPLY 2 GRAMS TOPICALLY 4 (FOUR) TIMES DAILY AS NEEDED.   HYDROCHLOROTHIAZIDE (HYDRODIURIL) 12.5 MG TABLET    Take 0.5 tablets (6.25 mg total) by mouth daily.   HYDROXYPROPYL METHYLCELLULOSE / HYPROMELLOSE (ISOPTO TEARS / GONIOVISC) 2.5 % OPHTHALMIC SOLUTION    Place 1 drop into both eyes as needed for dry eyes.    LEVOTHYROXINE (SYNTHROID) 25 MCG TABLET    TAKE 1 TABLET BY MOUTH DAILY BEFORE BREAKFAST.    LINZESS 145 MCG CAPS CAPSULE    TAKE 1 CAPSULE (145 MCG TOTAL) BY MOUTH DAILY BEFORE BREAKFAST. OFFICE VISIT FOR FURTHER REFILLS   MELOXICAM (MOBIC) 7.5 MG TABLET    TAKE 1 TABLET BY MOUTH EVERY DAY AS NEEDED FOR PAIN   MULTIPLE VITAMIN (MULTIVITAMIN WITH MINERALS) TABS    Take 1 tablet by mouth daily.   OMEPRAZOLE (PRILOSEC) 40 MG CAPSULE  TAKE 1 CAPSULE BY MOUTH EVERY DAY   TRAMADOL (ULTRAM) 50 MG TABLET    Take one tablet by mouth every 6 hours as needed for pain  Modified Medications   No medications on file  Discontinued Medications   ROPINIROLE (REQUIP) 0.25 MG TABLET    TAKE 1 TABLET BY MOUTH EVERY DAY AFTER SUPPER. TAKE 1-2 HOURS BEFORE GOING TO BEDTIME    Physical Exam:  There were no vitals filed for this visit. There is no height or weight on file to calculate BMI. Wt Readings from Last 3 Encounters:  06/16/21 161 lb (73 kg)  03/15/21 164 lb (74.4 kg)  11/09/20 174 lb (78.9 kg)      Labs reviewed: Basic Metabolic Panel: Recent Labs    09/14/20 1102 10/14/20 1203 03/15/21 0938 06/16/21 1019  NA 142 143 145 144  K 4.1 3.9 3.9 4.4  CL 107 107 109 108  CO2 27 29 28 29   GLUCOSE 104* 100 92 90  BUN 13 15 17 18   CREATININE 0.87 0.90 0.90 0.92  CALCIUM 9.5 9.4 10.0 9.5  TSH 0.38*  --  0.78  --    Liver Function Tests: Recent Labs    09/14/20 1102 03/15/21 0938 06/16/21 1019  AST 25 26 26   ALT 11 13 13   BILITOT 0.4 0.4 0.4  PROT 6.7 6.5 6.5   No results for input(s): LIPASE, AMYLASE in the last 8760 hours. No results for input(s): AMMONIA in the last 8760 hours. CBC: Recent Labs    09/14/20 1102 03/15/21 0938 06/16/21 1019  WBC 3.6* 3.3* 3.2*  NEUTROABS 1,508 1,409* 1,165*  HGB 12.1 10.9* 11.0*  HCT 37.7 35.0 34.6*  MCV 94.5 96.7 95.8  PLT 243 234 286   Lipid Panel: Recent Labs    03/15/21 0938  CHOL 210*  HDL 90  LDLCALC 106*  TRIG 62  CHOLHDL 2.3   TSH: Recent Labs    09/14/20 1102 03/15/21 0938  TSH 0.38* 0.78   A1C: Lab  Results  Component Value Date   HGBA1C 5.6 07/05/2014     Assessment/Plan 1. Thoracic aortic ectasia (HCC) Noted on recent chest xray- will get CT to further evaluate - CT Angio Chest W/Cm &/Or Wo Cm; Future  Salvador Bigbee K. Harle Battiest  [facility] [phone]    Virtual Visit via mychart video  I connected with patient on 09/14/21 at  9:30 AM EST by video and verified that I am speaking with the correct person using two identifiers.  Location: Patient: home Provider: [place].    I discussed the limitations, risks, security and privacy concerns of performing an evaluation and management service by telephone and the availability of in person appointments. I also discussed with the patient that there may be a patient responsible charge related to this service. The patient expressed understanding and agreed to proceed.   I discussed the assessment and treatment plan with the patient. The patient was provided an opportunity to ask questions and all were answered. The patient agreed with the plan and demonstrated an understanding of the instructions.   The patient was advised to call back or seek an in-person evaluation if the symptoms worsen or if the condition fails to improve as anticipated.  I provided 15 minutes of non-face-to-face time during this encounter.  Carlos American. Harle Battiest Avs printed and mailed

## 2021-09-14 NOTE — Progress Notes (Signed)
This service is provided via telemedicine  No vital signs collected/recorded due to the encounter was a telemedicine visit.   Location of patient (ex: home, work):  Home  Patient consents to a telephone visit:  Yes, see encounter dated 11/05/2020  Location of the provider (ex: office, home):  [place]  Name of any referring provider:  N/A  Names of all persons participating in the telemedicine service and their role in the encounter:  Sherrie Mustache, Nurse Practitioner, Carroll Kinds, CMA, patient and daughter, Aldona Bar  Time spent on call:  10 minutes with medical assistant

## 2021-09-15 ENCOUNTER — Ambulatory Visit
Admission: RE | Admit: 2021-09-15 | Discharge: 2021-09-15 | Disposition: A | Discharge status: Home / Self Care | Payer: Medicare HMO | Source: Ambulatory Visit | Attending: Nurse Practitioner | Admitting: Nurse Practitioner

## 2021-09-15 DIAGNOSIS — I700 Atherosclerosis of aorta: Secondary | ICD-10-CM | POA: Diagnosis not present

## 2021-09-15 DIAGNOSIS — I77810 Thoracic aortic ectasia: Secondary | ICD-10-CM

## 2021-09-15 MED ORDER — IOPAMIDOL (ISOVUE-370) INJECTION 76%
75.0000 mL | Freq: Once | INTRAVENOUS | Status: AC | PRN
  Administered 2021-09-15: 75 mL via INTRAVENOUS

## 2021-09-16 ENCOUNTER — Other Ambulatory Visit: Payer: Self-pay | Admitting: Nurse Practitioner

## 2021-09-16 DIAGNOSIS — E7800 Pure hypercholesterolemia, unspecified: Secondary | ICD-10-CM

## 2021-09-16 MED ORDER — ATORVASTATIN CALCIUM 10 MG PO TABS: 5.0000 mg | ORAL_TABLET | Freq: Every day | ORAL | 1 refills | Status: DC

## 2021-09-17 ENCOUNTER — Other Ambulatory Visit: Payer: Self-pay

## 2021-09-17 MED ORDER — ASPIRIN 81 MG PO TBEC: 81.0000 mg | DELAYED_RELEASE_TABLET | Freq: Every day | ORAL | 12 refills | Status: DC

## 2021-09-27 ENCOUNTER — Other Ambulatory Visit: Payer: Self-pay | Admitting: Nurse Practitioner

## 2021-09-27 DIAGNOSIS — M542 Cervicalgia: Secondary | ICD-10-CM

## 2021-09-27 DIAGNOSIS — S134XXD Sprain of ligaments of cervical spine, subsequent encounter: Secondary | ICD-10-CM

## 2021-09-28 ENCOUNTER — Other Ambulatory Visit: Payer: Self-pay

## 2021-09-28 DIAGNOSIS — M159 Polyosteoarthritis, unspecified: Secondary | ICD-10-CM

## 2021-09-28 DIAGNOSIS — M545 Low back pain, unspecified: Secondary | ICD-10-CM

## 2021-09-28 DIAGNOSIS — G8929 Other chronic pain: Secondary | ICD-10-CM

## 2021-09-28 MED ORDER — TRAMADOL HCL 50 MG PO TABS: ORAL_TABLET | ORAL | 0 refills | Status: DC

## 2021-09-28 NOTE — Telephone Encounter (Signed)
RX last refilled on 08/05/2021, treatment agreement on file from 03/15/2021

## 2021-10-04 ENCOUNTER — Telehealth: Payer: Self-pay | Admitting: *Deleted

## 2021-10-04 ENCOUNTER — Encounter: Payer: Self-pay | Admitting: Adult Health

## 2021-10-04 ENCOUNTER — Encounter: Payer: Self-pay | Admitting: Nurse Practitioner

## 2021-10-04 ENCOUNTER — Other Ambulatory Visit: Payer: Self-pay

## 2021-10-04 ENCOUNTER — Ambulatory Visit (INDEPENDENT_AMBULATORY_CARE_PROVIDER_SITE_OTHER): Payer: Medicare HMO | Admitting: Adult Health

## 2021-10-04 VITALS — BP 144/90 | HR 59 | Temp 98.7°F | Ht 61.0 in | Wt 169.0 lb

## 2021-10-04 DIAGNOSIS — R600 Localized edema: Secondary | ICD-10-CM | POA: Diagnosis not present

## 2021-10-04 MED ORDER — HYDROCHLOROTHIAZIDE 25 MG PO TABS: 25.0000 mg | ORAL_TABLET | Freq: Every day | ORAL | 3 refills | Status: DC

## 2021-10-04 MED ORDER — HYDROCHLOROTHIAZIDE 12.5 MG PO CAPS: 12.5000 mg | ORAL_CAPSULE | Freq: Every day | ORAL | 0 refills | Status: DC

## 2021-10-04 NOTE — Telephone Encounter (Signed)
[facility] [phone] called and stated that they received 2 Rx's today for the same medication. Stated that they received: HCTZ 25mg   and  HCTZ 12.5mg  with same directions.   Wants to clarify which one you want patient taking.  Please Advise.

## 2021-10-04 NOTE — Progress Notes (Signed)
Location:  [facility]:   clinic    CODE STATUS: full code   No Known Allergies  Chief Complaint  Patient presents with   Acute Visit    Patient present today for a bilateral feet and ankle swelling for 2-3 days. Right side is worse with tightness but no pain.    HPI:  For the past 3-4 days she has had bilateral lower extremity edema she is presently taking hctz 6.25 mg daily. She denies any shortness of breath; no increased fatigue is able to go about her normal activities. She did take 12.5 mg hctz today. Her family is concerned about her edema. Her heart function is normal 60-65% ef.   Past Medical History:  Diagnosis Date   Allergic rhinitis    Anxiety    Arthritis    Atrophic vaginitis 06/25/2019   Breast screening 10/26/2010   Last Assessment & Plan:  Formatting of this note might be different from the original. Will order a mammogram for this fall;  Follow up with me around Thanksgiving.   Callus of heel 02/19/2013   Colon polyps    adenomatous   Constipation 08/20/2013   Corns and callosities    Diverticulosis    Dysuria 06/03/2011   Last Assessment & Plan:  Formatting of this note might be different from the original. New, acute complaint today;  Will check urinalysis with her labs today;   GERD (gastroesophageal reflux disease)    Glaucoma    H/O total hysterectomy 02/19/2013   Hallux valgus 10/26/2010   Last Assessment & Plan:  Formatting of this note might be different from the original. Had surgery with Dr Jomarie Longs back in October;   Will have her follow up with him.   Heartburn 01/02/3613   Helicobacter pylori gastritis 08/12/2014   Hiatal hernia with gastroesophageal reflux 12/23/2014   Hypercholesteremia    no meds   Hypercholesterolemia 06/03/2011   Overview:  TC 205 (elevated) in 2010  Last Assessment & Plan:  Formatting of this note might be different from the original. Lab Results  Component Value Date   CHOL 205 12/09/2011   CHOL  233 06/03/2011   CHOL 205 05/26/2009   Lab Results  Component Value Date   HDL 82 12/09/2011   HDL 90 06/03/2011   HDL 75 05/26/2009   Lab Results  Component Value Date   LDLCALC 113 12/09/2011   [place] 133 06/03/2011   LDLCA   Hypothyroidism    Left-sided low back pain with left-sided sciatica 08/12/2014   Neck pain 10/26/2010   Obesity    Osteoarthritis 10/26/2010   Osteopenia 01/08/2014   Overweight 10/26/2010   Postmenopausal estrogen deficiency 02/19/2013   Recurrent UTI 06/25/2019   Restless leg syndrome 08/20/2013   RLS (restless legs syndrome) 07/08/2014   Routine general medical examination at a health care facility 02/19/2013   S/P carpal tunnel release 10/26/2010   Screen for colon cancer 12/16/2011   Last Assessment & Plan:  Formatting of this note might be different from the original. Refer to GI;   Screening for diabetes mellitus    Shoulder pain, right 12/16/2011   Last Assessment & Plan:  Formatting of this note might be different from the original. We have tried physical therapy;   We shot and reviewed films today;  She has  1)  Osteopenia 2)  Small spur at humeral head 3)  Some hypertrophic/lipping changes at the inferior glenoid rim;   I  also injected it last visit;  She is on NSAIDS and tylenol;  Will trial voltaren gel;  Then get ortho opinion;   Skin spots-aging 07/08/2014   Urge incontinence    Whiplash injury 06/26/2018    Past Surgical History:  Procedure Laterality Date   ABDOMINAL HYSTERECTOMY  1985   Dr Candace Gallus Right    Dr Jomarie Longs   CARPAL TUNNEL RELEASE Bilateral 2010   Dr Laurance Flatten   CESAREAN SECTION  1983   1 time   COLONOSCOPY     ROTATOR CUFF REPAIR Right    Dr Theda Sers   THYROIDECTOMY  1987   Dr Guadlupe Spanish   TONSILLECTOMY  1951    Social History   Socioeconomic History   Marital status: Widowed    Spouse name: Not on file   Number of children: 1   Years of education: Not on file   Highest education level: Not on file  Occupational History    Occupation: retired  Tobacco Use   Smoking status: Former    Types: Cigarettes    Quit date: 10/31/1986    Years since quitting: 34.9   Smokeless tobacco: Never  Vaping Use   Vaping Use: Never used  Substance and Sexual Activity   Alcohol use: No    Alcohol/week: 0.0 standard drinks   Drug use: No   Sexual activity: Never  Other Topics Concern   Not on file  Social History Narrative   Not on file   Social Determinants of Health   Financial Resource Strain: Not on file  Food Insecurity: Not on file  Transportation Needs: Not on file  Physical Activity: Not on file  Stress: Not on file  Social Connections: Not on file  Intimate Partner Violence: Not on file   Family History  Problem Relation Age of Onset   Leukemia Mother 51   Lung cancer Father    Diabetes Brother    COPD Brother    Lung cancer Brother    Diabetes Maternal Grandmother    Cancer Maternal Grandfather        unknown type   Breast cancer Maternal Aunt    Colon polyps Neg Hx    Rectal cancer Neg Hx       VITAL SIGNS BP (!) 144/90   Pulse (!) 59   Temp 98.7 F (37.1 C)   Ht 5\' 1"  (1.549 m)   Wt 169 lb (76.7 kg)   SpO2 98%   BMI 31.93 kg/m   Outpatient Encounter Medications as of 10/04/2021  Medication Sig   acetaminophen (TYLENOL) 500 MG tablet Take 1,000 mg by mouth every 8 (eight) hours as needed for mild pain.   Ascorbic Acid (VITAMIN C PO) Take 1 tablet by mouth daily.   aspirin 81 MG EC tablet Take 1 tablet (81 mg total) by mouth daily. Swallow whole.   atorvastatin (LIPITOR) 10 MG tablet Take 0.5 tablets (5 mg total) by mouth daily.   CALCIUM-VITAMIN D PO Take 2 tablets by mouth daily.    CRANBERRY PO Take 1 tablet by mouth daily.   diclofenac Sodium (VOLTAREN) 1 % GEL APPLY 2 GRAMS TOPICALLY 4 (FOUR) TIMES DAILY AS NEEDED.   hydrochlorothiazide (HYDRODIURIL) 12.5 MG tablet Take 0.5 tablets (6.25 mg total) by mouth daily.   hydroxypropyl methylcellulose / hypromellose (ISOPTO TEARS /  GONIOVISC) 2.5 % ophthalmic solution Place 1 drop into both eyes as needed for dry eyes.    levothyroxine (SYNTHROID) 25 MCG tablet TAKE 1 TABLET BY  MOUTH DAILY BEFORE BREAKFAST.   LINZESS 145 MCG CAPS capsule TAKE 1 CAPSULE (145 MCG TOTAL) BY MOUTH DAILY BEFORE BREAKFAST. OFFICE VISIT FOR FURTHER REFILLS   meloxicam (MOBIC) 7.5 MG tablet TAKE 1 TABLET BY MOUTH EVERY DAY AS NEEDED FOR PAIN   Multiple Vitamin (MULTIVITAMIN WITH MINERALS) TABS Take 1 tablet by mouth daily.   omeprazole (PRILOSEC) 40 MG capsule TAKE 1 CAPSULE BY MOUTH EVERY DAY   traMADol (ULTRAM) 50 MG tablet Take one tablet by mouth every 6 hours as needed for pain   No facility-administered encounter medications on file as of 10/04/2021.     SIGNIFICANT DIAGNOSTIC EXAMS  TODAY  06-16-21: glucose 90; bun 18; creat 0.92 ;k+ 4.4; na++ 144; ca 9.5; GFR 64; liver normal albumin 4.0   Review of Systems  Constitutional:  Negative for malaise/fatigue.  Respiratory:  Negative for cough and shortness of breath.   Cardiovascular:  Positive for leg swelling. Negative for chest pain and palpitations.  Gastrointestinal:  Negative for abdominal pain, constipation and heartburn.  Musculoskeletal:  Negative for back pain, joint pain and myalgias.  Skin: Negative.   Neurological:  Negative for dizziness.  Psychiatric/Behavioral:  The patient is not nervous/anxious.    Physical Exam Constitutional:      General: She is not in acute distress.    Appearance: She is well-developed. She is obese. She is not diaphoretic.  Neck:     Thyroid: No thyromegaly.  Cardiovascular:     Rate and Rhythm: Normal rate and regular rhythm.     Pulses: Normal pulses.     Heart sounds: Normal heart sounds.  Pulmonary:     Effort: Pulmonary effort is normal. No respiratory distress.     Breath sounds: Normal breath sounds.  Abdominal:     General: Bowel sounds are normal. There is no distension.     Palpations: Abdomen is soft.     Tenderness: There  is no abdominal tenderness.  Musculoskeletal:        General: Normal range of motion.     Cervical back: Neck supple.     Right lower leg: Edema present.     Left lower leg: Edema present.     Comments: 1-2+ bilateral lower extremity edema right >left   Lymphadenopathy:     Cervical: No cervical adenopathy.  Skin:    General: Skin is warm and dry.  Neurological:     Mental Status: She is alert and oriented to person, place, and time.  Psychiatric:        Mood and Affect: Mood normal.     ASSESSMENT/ PLAN:  Bilateral lower extremity edema: is worse; will check BNP; TSH; d-dimer Will increase her hctz to 12.5 mg daily will check BMP on 11-01-21; prior to her follow up visit with J. Eubanks.    Ok Edwards NP [facility]  Contact [phone] Monday through Friday 8am- 5pm  After hours call [phone]

## 2021-10-05 ENCOUNTER — Other Ambulatory Visit: Payer: Self-pay | Admitting: Nurse Practitioner

## 2021-10-05 DIAGNOSIS — M159 Polyosteoarthritis, unspecified: Secondary | ICD-10-CM

## 2021-10-05 LAB — D-DIMER, QUANTITATIVE: D-Dimer, Quant: 0.5 mcg/mL FEU — ABNORMAL HIGH (ref <0.50)

## 2021-10-05 LAB — BRAIN NATRIURETIC PEPTIDE: Brain Natriuretic Peptide: 98 pg/mL (ref <100)

## 2021-10-05 LAB — TSH: TSH: 1.17 mIU/L (ref 0.40–4.50)

## 2021-10-05 NOTE — Telephone Encounter (Signed)
Patient called and stated that she was told to take HCTZ 12.5mg  and stated that she would like for [place] to call her pharmacy.  Called and spoke with pharmacist.     ASSESSMENT/ PLAN:   Bilateral lower extremity edema: is worse; will check BNP; TSH; d-dimer Will increase her hctz to 12.5 mg daily will check BMP on 11-01-21; prior to her follow up visit with J. Eubanks.

## 2021-10-05 NOTE — Telephone Encounter (Signed)
Gerlene Fee, NP  You 58 minutes ago (10:40 AM)   Unfortunately I hit the wrong button the order should be for 12.5 mg thanks  Deb

## 2021-10-13 DIAGNOSIS — M19032 Primary osteoarthritis, left wrist: Secondary | ICD-10-CM

## 2021-10-13 DIAGNOSIS — M25532 Pain in left wrist: Secondary | ICD-10-CM | POA: Diagnosis not present

## 2021-10-13 DIAGNOSIS — M13832 Other specified arthritis, left wrist: Secondary | ICD-10-CM | POA: Diagnosis not present

## 2021-10-13 HISTORY — DX: Primary osteoarthritis, left wrist: M19.032

## 2021-11-08 ENCOUNTER — Encounter: Payer: Medicare HMO | Admitting: Nurse Practitioner

## 2021-11-11 ENCOUNTER — Telehealth: Payer: Self-pay

## 2021-11-11 ENCOUNTER — Other Ambulatory Visit: Payer: Self-pay

## 2021-11-11 ENCOUNTER — Encounter: Payer: Self-pay | Admitting: Nurse Practitioner

## 2021-11-11 ENCOUNTER — Other Ambulatory Visit: Payer: Self-pay | Admitting: Nurse Practitioner

## 2021-11-11 ENCOUNTER — Ambulatory Visit (INDEPENDENT_AMBULATORY_CARE_PROVIDER_SITE_OTHER): Payer: Medicare HMO | Admitting: Nurse Practitioner

## 2021-11-11 DIAGNOSIS — Z1231 Encounter for screening mammogram for malignant neoplasm of breast: Secondary | ICD-10-CM

## 2021-11-11 DIAGNOSIS — E2839 Other primary ovarian failure: Secondary | ICD-10-CM | POA: Diagnosis not present

## 2021-11-11 DIAGNOSIS — Z0000 Encounter for general adult medical examination without abnormal findings: Secondary | ICD-10-CM | POA: Diagnosis not present

## 2021-11-11 NOTE — Progress Notes (Signed)
Subjective:   Colleen [name] is a 76 y.o. female who presents for Medicare Annual (Subsequent) preventive examination.  Review of Systems     Cardiac Risk Factors include: advanced age (>31men, >39 women);obesity (BMI >30kg/m2);dyslipidemia     Objective:    Today's Vitals   11/11/21 1058  PainSc: 6    There is no height or weight on file to calculate BMI.  Advanced Directives 11/11/2021 06/16/2021 03/15/2021 11/05/2020 09/29/2020 04/10/2020 03/13/2020  Does Patient Have a Medical Advance Directive? Yes Yes Yes Yes Yes Yes Yes  Type of Paramedic of [place];Out of facility DNR (pink MOST or yellow form) [place];Out of facility DNR (pink MOST or yellow form) [place];Out of facility DNR (pink MOST or yellow form) [place];Out of facility DNR (pink MOST or yellow form) Healthcare Power of [place]  Does patient want to make changes to medical advance directive? No - Patient declined No - Patient declined No - Patient declined No - Patient declined No - Patient declined No - Patient declined No - Patient declined  Copy of [place] in Chart? Yes - validated most recent copy scanned in chart (See row information) Yes - validated most recent copy scanned in chart (See row information) Yes - validated most recent copy scanned in chart (See row information) Yes - validated most recent copy scanned in chart (See row information) Yes - validated most recent copy scanned in chart (See row information) Yes - validated most recent copy scanned in chart (See row information) Yes - validated most recent copy scanned in chart (See row information)  Would patient like information on creating a medical advance directive? - - - - - - -  Pre-existing out of facility DNR order (yellow form or pink MOST form) - Pink MOST form placed in chart (order not valid  for inpatient use) Pink MOST form placed in chart (order not valid for inpatient use) - - - -    Current Medications (verified) Outpatient Encounter Medications as of 11/11/2021  Medication Sig   acetaminophen (TYLENOL) 500 MG tablet Take 1,000 mg by mouth every 8 (eight) hours as needed for mild pain.   Ascorbic Acid (VITAMIN C PO) Take 1 tablet by mouth daily.   aspirin 81 MG EC tablet Take 1 tablet (81 mg total) by mouth daily. Swallow whole. (Patient taking differently: Take 81 mg by mouth every other day. Swallow whole.)   atorvastatin (LIPITOR) 10 MG tablet Take 0.5 tablets (5 mg total) by mouth daily.   CALCIUM-VITAMIN D PO Take 2 tablets by mouth daily.    CRANBERRY PO Take 1 tablet by mouth daily.   diclofenac Sodium (VOLTAREN) 1 % GEL APPLY 2 GRAMS TOPICALLY 4 (FOUR) TIMES DAILY AS NEEDED.   hydrochlorothiazide (MICROZIDE) 12.5 MG capsule Take 12.5 mg by mouth daily.   hydroxypropyl methylcellulose / hypromellose (ISOPTO TEARS / GONIOVISC) 2.5 % ophthalmic solution Place 1 drop into both eyes as needed for dry eyes.    levothyroxine (SYNTHROID) 25 MCG tablet TAKE 1 TABLET BY MOUTH DAILY BEFORE BREAKFAST.   LINZESS 145 MCG CAPS capsule TAKE 1 CAPSULE (145 MCG TOTAL) BY MOUTH DAILY BEFORE BREAKFAST. OFFICE VISIT FOR FURTHER REFILLS   meloxicam (MOBIC) 7.5 MG tablet TAKE 1 TABLET BY MOUTH EVERY DAY AS NEEDED FOR PAIN   Multiple Vitamin (MULTIVITAMIN WITH MINERALS) TABS Take 1 tablet by mouth daily.   omeprazole (  PRILOSEC) 40 MG capsule TAKE 1 CAPSULE BY MOUTH EVERY DAY   traMADol (ULTRAM) 50 MG tablet Take one tablet by mouth every 6 hours as needed for pain   [DISCONTINUED] hydrochlorothiazide (MICROZIDE) 12.5 MG capsule Take 1 capsule (12.5 mg total) by mouth daily. (Patient taking differently: Take by mouth daily.)   No facility-administered encounter medications on file as of 11/11/2021.    Allergies (verified) Patient has no known allergies.   History: Past Medical History:   Diagnosis Date   Allergic rhinitis    Anxiety    Arthritis    Atrophic vaginitis 06/25/2019   Breast screening 10/26/2010   Last Assessment & Plan:  Formatting of this note might be different from the original. Will order a mammogram for this fall;  Follow up with me around Thanksgiving.   Callus of heel 02/19/2013   Colon polyps    adenomatous   Constipation 08/20/2013   Corns and callosities    Diverticulosis    Dysuria 06/03/2011   Last Assessment & Plan:  Formatting of this note might be different from the original. New, acute complaint today;  Will check urinalysis with her labs today;   GERD (gastroesophageal reflux disease)    Glaucoma    H/O total hysterectomy 02/19/2013   Hallux valgus 10/26/2010   Last Assessment & Plan:  Formatting of this note might be different from the original. Had surgery with Dr Jomarie Longs back in October;   Will have her follow up with him.   Heartburn 06/06/7543   Helicobacter pylori gastritis 08/12/2014   Hiatal hernia with gastroesophageal reflux 12/23/2014   Hypercholesteremia    no meds   Hypercholesterolemia 06/03/2011   Overview:  TC 205 (elevated) in 2010  Last Assessment & Plan:  Formatting of this note might be different from the original. Lab Results  Component Value Date   CHOL 205 12/09/2011   CHOL 233 06/03/2011   CHOL 205 05/26/2009   Lab Results  Component Value Date   HDL 82 12/09/2011   HDL 90 06/03/2011   HDL 75 05/26/2009   Lab Results  Component Value Date   LDLCALC 113 12/09/2011   [place] 133 06/03/2011   LDLCA   Hypothyroidism    Left-sided low back pain with left-sided sciatica 08/12/2014   Neck pain 10/26/2010   Obesity    Osteoarthritis 10/26/2010   Osteopenia 01/08/2014   Overweight 10/26/2010   Postmenopausal estrogen deficiency 02/19/2013   Recurrent UTI 06/25/2019   Restless leg syndrome 08/20/2013   RLS (restless legs syndrome) 07/08/2014   Routine general medical examination at a health care facility 02/19/2013   S/P carpal tunnel release  10/26/2010   Screen for colon cancer 12/16/2011   Last Assessment & Plan:  Formatting of this note might be different from the original. Refer to GI;   Screening for diabetes mellitus    Shoulder pain, right 12/16/2011   Last Assessment & Plan:  Formatting of this note might be different from the original. We have tried physical therapy;   We shot and reviewed films today;  She has  1)  Osteopenia 2)  Small spur at humeral head 3)  Some hypertrophic/lipping changes at the inferior glenoid rim;   I also injected it last visit;  She is on NSAIDS and tylenol;  Will trial voltaren gel;  Then get ortho opinion;   Skin spots-aging 07/08/2014   Urge incontinence    Whiplash injury 06/26/2018   Past Surgical History:  Procedure Laterality Date  ABDOMINAL HYSTERECTOMY  1985   Dr Candace Gallus Right    Dr Jomarie Longs   CARPAL TUNNEL RELEASE Bilateral 2010   Dr Laurance Flatten   CATARACT EXTRACTION Bilateral    CESAREAN SECTION  1983   1 time   COLONOSCOPY     ROTATOR CUFF REPAIR Right    Dr Theda Sers   THYROIDECTOMY  1987   Dr Guadlupe Spanish   TONSILLECTOMY  1951   Family History  Problem Relation Age of Onset   Leukemia Mother 50   Lung cancer Father    Diabetes Brother    COPD Brother    Lung cancer Brother    Diabetes Maternal Grandmother    Cancer Maternal Grandfather        unknown type   Breast cancer Maternal Aunt    Colon polyps Neg Hx    Rectal cancer Neg Hx    Social History   Socioeconomic History   Marital status: Widowed    Spouse name: Not on file   Number of children: 1   Years of education: Not on file   Highest education level: Not on file  Occupational History   Occupation: retired  Tobacco Use   Smoking status: Former    Types: Cigarettes    Quit date: 10/31/1986    Years since quitting: 35.0   Smokeless tobacco: Never  Vaping Use   Vaping Use: Never used  Substance and Sexual Activity   Alcohol use: No    Alcohol/week: 0.0 standard drinks   Drug use: No   Sexual  activity: Never  Other Topics Concern   Not on file  Social History Narrative   Not on file   Social Determinants of Health   Financial Resource Strain: Not on file  Food Insecurity: Not on file  Transportation Needs: Not on file  Physical Activity: Not on file  Stress: Not on file  Social Connections: Not on file    Tobacco Counseling Counseling given: Not Answered   Clinical Intake:  Pre-visit preparation completed: Yes  Pain : 0-10 Pain Score: 6  Pain Type: Chronic pain Pain Location: Other (Comment) (arthritis pain, multiple joints. shoulder, hands) Pain Orientation: Other (Comment)     BMI - recorded: 31.9 Nutritional Status: BMI > 30  Obese Diabetes: No  How often do you need to have someone help you when you read instructions, pamphlets, or other written materials from your doctor or pharmacy?: 1 - Never  Diabetic?no         Activities of Daily Living In your present state of health, do you have any difficulty performing the following activities: 11/11/2021  Hearing? N  Vision? N  Difficulty concentrating or making decisions? N  Walking or climbing stairs? N  Dressing or bathing? N  Doing errands, shopping? N  Preparing Food and eating ? N  Using the Toilet? N  In the past six months, have you accidently leaked urine? N  Do you have problems with loss of bowel control? N  Managing your Medications? N  Managing your Finances? Y  Comment daughter helps  Housekeeping or managing your Housekeeping? N  Some recent data might be hidden    Patient Care Team: [name], NP as PCP - General (Nurse Practitioner)  Indicate any recent Medical Services you may have received from other than [facility] providers in the past year (date may be approximate).     Assessment:   This is a routine wellness examination for [facility].  Hearing/Vision screen Hearing  Screening - Comments:: No hearing problems Vision Screening - Comments:: Patient wears glasses.  Patient has had yearly eye exam within past year. Patient sees Dr. Gershon Crane  Dietary issues and exercise activities discussed: Current Exercise Habits: The patient does not participate in regular exercise at present   Goals Addressed   None    Depression Screen PHQ 2/9 Scores 11/11/2021 11/05/2020 03/13/2020 11/26/2019 11/04/2019 08/28/2019 10/29/2018  PHQ - 2 Score 0 0 0 0 0 0 0    Fall Risk Fall Risk  11/11/2021 10/04/2021 03/15/2021 11/05/2020 09/29/2020  Falls in the past year? 0 0 0 1 0  Comment - - - - -  Number falls in past yr: 0 0 0 1 0  Injury with Fall? 0 0 0 0 0  Comment - - - - -  Risk for fall due to : No Fall Risks No Fall Risks - - -  Follow up Falls evaluation completed Falls evaluation completed;Education provided;Falls prevention discussed - - -    FALL RISK PREVENTION PERTAINING TO THE HOME:  Any stairs in or around the home? Yes  If so, are there any without handrails? No  Home free of loose throw rugs in walkways, pet beds, electrical cords, etc? Yes  Adequate lighting in your home to reduce risk of falls? Yes   ASSISTIVE DEVICES UTILIZED TO PREVENT FALLS:  Life alert? No  Use of a cane, walker or w/c? No  Grab bars in the bathroom? No  Shower chair or bench in shower? No  Elevated toilet seat or a handicapped toilet? Yes   TIMED UP AND GO:  Was the test performed? No .    Cognitive Function: MMSE - Mini Mental State Exam 10/29/2018 10/26/2017 10/20/2016 10/22/2015 07/08/2014  Not completed: - - - (No Data) -  Orientation to time 5 4 5 5 5   Orientation to Place 5 5 5 5 5   Registration 3 3 3 3 3   Attention/ Calculation 5 5 5 5 5   Recall 2 2 3 3 2   Language- name 2 objects 2 2 2 2 2   Language- repeat 1 1 1 1 1   Language- follow 3 step command 3 3 3 2 3   Language- read & follow direction 1 1 1 1 1   Write a sentence 1 1 1 1 1   Copy design 1 1 1 1 1   Total score 29 28 30 29 29      6CIT Screen 11/11/2021 11/05/2020 11/04/2019  What Year? 0 points 0 points 0  points  What month? 0 points 0 points 0 points  What time? 0 points 0 points 0 points  Count back from 20 0 points 0 points 0 points  Months in reverse 0 points 2 points 0 points  Repeat phrase 0 points 2 points 0 points  Total Score 0 4 0    Immunizations Immunization History  Administered Date(s) Administered   Fluad Quad(high Dose 65+) 08/13/2021   Influenza, High Dose Seasonal PF 08/26/2017, 08/06/2018, 07/28/2019, 08/13/2020   Influenza,inj,Quad PF,6+ Mos 07/08/2014   Influenza-Unspecified 08/09/2012, 08/01/2013, 06/30/2015, 08/04/2016, 07/31/2017   PFIZER(Purple Top)SARS-COV-2 Vaccination 11/19/2019, 12/10/2019, 07/22/2020   Pfizer Covid-19 Vaccine Bivalent Booster 5yrs & up 08/13/2021   Pneumococcal Conjugate-13 11/03/2014   Pneumococcal Polysaccharide-23 02/19/2013, 08/20/2017   Td 10/31/2000   Tdap 10/22/2015   Varicella 06/15/2011   Zoster Recombinat (Shingrix) 03/21/2017, 06/04/2017   Zoster, Live 10/31/2010    TDAP status: Up to date  Flu Vaccine status: Up to date  Pneumococcal vaccine  status: Up to date  Covid-19 vaccine status: Completed vaccines  Qualifies for Shingles Vaccine? Yes   Zostavax completed Yes   Shingrix Completed?: Yes  Screening Tests Health Maintenance  Topic Date Due   TETANUS/TDAP  10/21/2025   Pneumonia Vaccine 24+ Years old  Completed   INFLUENZA VACCINE  Completed   DEXA SCAN  Completed   COVID-19 Vaccine  Completed   Hepatitis C Screening  Completed   Zoster Vaccines- Shingrix  Completed   HPV VACCINES  Aged Out   COLONOSCOPY (Pts 45-35yrs Insurance coverage will need to be confirmed)  Discontinued    Health Maintenance  There are no preventive care reminders to display for this patient.  Colorectal cancer screening: No longer required.   Mammogram status: No longer required due to age.  Bone Density status: Ordered today. Pt provided with contact info and advised to call to schedule appt.  Lung Cancer Screening:  (Low Dose CT Chest recommended if Age 38-80 years, 30 pack-year currently smoking OR have quit w/in 15years.) does not qualify.   Lung Cancer Screening Referral: na  Additional Screening:  Hepatitis C Screening: does not qualify;   Vision Screening: Recommended annual ophthalmology exams for early detection of glaucoma and other disorders of the eye. Is the patient up to date with their annual eye exam?  Yes  Who is the provider or what is the name of the office in which the patient attends annual eye exams? [facility] If pt is not established with a provider, would they like to be referred to a provider to establish care? No .   Dental Screening: Recommended annual dental exams for proper oral hygiene  Community Resource Referral / Chronic Care Management: CRR required this visit?  No   CCM required this visit?  No      Plan:     I have personally reviewed and noted the following in the patients chart:   Medical and social history Use of alcohol, tobacco or illicit drugs  Current medications and supplements including opioid prescriptions.  Functional ability and status Nutritional status Physical activity Advanced directives List of other physicians Hospitalizations, surgeries, and ER visits in previous 12 months Vitals Screenings to include cognitive, depression, and falls Referrals and appointments  In addition, I have reviewed and discussed with patient certain preventive protocols, quality metrics, and best practice recommendations. A written personalized care plan for preventive services as well as general preventive health recommendations were provided to patient.     [name], NP   11/11/2021    Virtual Visit via Telephone Note  I connected withNAME@ on 11/11/21 at 10:30 AM EST by telephone and verified that I am speaking with the correct person using two identifiers.  Location: Patient: home Provider: [place]    I discussed the limitations,  risks, security and privacy concerns of performing an evaluation and management service by telephone and the availability of in person appointments. I also discussed with the patient that there may be a patient responsible charge related to this service. The patient expressed understanding and agreed to proceed.   I discussed the assessment and treatment plan with the patient. The patient was provided an opportunity to ask questions and all were answered. The patient agreed with the plan and demonstrated an understanding of the instructions.   The patient was advised to call back or seek an in-person evaluation if the symptoms worsen or if the condition fails to improve as anticipated.  I provided 15 minutes of  non-face-to-face time during this encounter.  Carlos American. Harle Battiest Avs printed and mailed

## 2021-11-11 NOTE — Progress Notes (Signed)
This service is provided via telemedicine  No vital signs collected/recorded due to the encounter was a telemedicine visit.   Location of patient (ex: home, work):  Home  Patient consents to a telephone visit:  Yes, see encounter dated 11/11/2021  Location of the provider (ex: office, home):  [place]  Name of any referring provider:  N/A  Names of all persons participating in the telemedicine service and their role in the encounter:  Sherrie Mustache, Nurse Practitioner, Carroll Kinds, CMA, and patient.   Time spent on call:  12 minutes with medical assistant

## 2021-11-11 NOTE — Telephone Encounter (Signed)
Ms. arelys, glassco are scheduled for a virtual visit with your provider today.    Just as we do with appointments in the office, we must obtain your consent to participate.  Your consent will be active for this visit and any virtual visit you may have with one of our providers in the next 365 days.    If you have a MyChart account, I can also send a copy of this consent to you electronically.  All virtual visits are billed to your insurance company just like a traditional visit in the office.  As this is a virtual visit, video technology does not allow for your provider to perform a traditional examination.  This may limit your provider's ability to fully assess your condition.  If your provider identifies any concerns that need to be evaluated in person or the need to arrange testing such as labs, EKG, etc, we will make arrangements to do so.    Although advances in technology are sophisticated, we cannot ensure that it will always work on either your end or our end.  If the connection with a video visit is poor, we may have to switch to a telephone visit.  With either a video or telephone visit, we are not always able to ensure that we have a secure connection.   I need to obtain your verbal consent now.   Are you willing to proceed with your visit today?   Eldene Plocher has provided verbal consent on 11/11/2021 for a virtual visit (video or telephone).   Carroll Kinds, CMA 11/11/2021  10:59 AM

## 2021-11-11 NOTE — Patient Instructions (Signed)
Colleen [name] , Thank you for taking time to come for your Medicare Wellness Visit. I appreciate your ongoing commitment to your health goals. Please review the following plan we discussed and let me know if I can assist you in the future.   Screening recommendations/referrals: Colonoscopy aged out  Mammogram up to date Bone Density to call and schedule- [phone] Recommended yearly ophthalmology/optometry visit for glaucoma screening and checkup Recommended yearly dental visit for hygiene and checkup  Vaccinations: Influenza vaccine up to date Pneumococcal vaccine up to date Tdap vaccine up to date Shingles vaccine up to date    Advanced directives: on file.   Conditions/risks identified: progressive arthritis, advance age  Next appointment: yearly for awv   Preventive Care 78 Years and Older, Female Preventive care refers to lifestyle choices and visits with your health care provider that can promote health and wellness. What does preventive care include? A yearly physical exam. This is also called an annual well check. Dental exams once or twice a year. Routine eye exams. Ask your health care provider how often you should have your eyes checked. Personal lifestyle choices, including: Daily care of your teeth and gums. Regular physical activity. Eating a healthy diet. Avoiding tobacco and drug use. Limiting alcohol use. Practicing safe sex. Taking low-dose aspirin every day. Taking vitamin and mineral supplements as recommended by your health care provider. What happens during an annual well check? The services and screenings done by your health care provider during your annual well check will depend on your age, overall health, lifestyle risk factors, and family history of disease. Counseling  Your health care provider may ask you questions about your: Alcohol use. Tobacco use. Drug use. Emotional well-being. Home and relationship well-being. Sexual  activity. Eating habits. History of falls. Memory and ability to understand (cognition). Work and work Statistician. Reproductive health. Screening  You may have the following tests or measurements: Height, weight, and BMI. Blood pressure. Lipid and cholesterol levels. These may be checked every 5 years, or more frequently if you are over 78 years old. Skin check. Lung cancer screening. You may have this screening every year starting at age 78 if you have a 30-pack-year history of smoking and currently smoke or have quit within the past 15 years. Fecal occult blood test (FOBT) of the stool. You may have this test every year starting at age 78. Flexible sigmoidoscopy or colonoscopy. You may have a sigmoidoscopy every 5 years or a colonoscopy every 10 years starting at age 78. Hepatitis C blood test. Hepatitis B blood test. Sexually transmitted disease (STD) testing. Diabetes screening. This is done by checking your blood sugar (glucose) after you have not eaten for a while (fasting). You may have this done every 1-3 years. Bone density scan. This is done to screen for osteoporosis. You may have this done starting at age 78. Mammogram. This may be done every 1-2 years. Talk to your health care provider about how often you should have regular mammograms. Talk with your health care provider about your test results, treatment options, and if necessary, the need for more tests. Vaccines  Your health care provider may recommend certain vaccines, such as: Influenza vaccine. This is recommended every year. Tetanus, diphtheria, and acellular pertussis (Tdap, Td) vaccine. You may need a Td booster every 10 years. Zoster vaccine. You may need this after age 78. Pneumococcal 13-valent conjugate (PCV13) vaccine. One dose is recommended after age 16. Pneumococcal polysaccharide (PPSV23) vaccine. One dose is recommended after age 78.  Talk to your health care provider about which screenings and vaccines  you need and how often you need them. This information is not intended to replace advice given to you by your health care provider. Make sure you discuss any questions you have with your health care provider. Document Released: 11/13/2015 Document Revised: 07/06/2016 Document Reviewed: 08/18/2015 Elsevier Interactive Patient Education  2017 [place] Prevention in the Home Falls can cause injuries. They can happen to people of all ages. There are many things you can do to make your home safe and to help prevent falls. What can I do on the outside of my home? Regularly fix the edges of walkways and driveways and fix any cracks. Remove anything that might make you trip as you walk through a door, such as a raised step or threshold. Trim any bushes or trees on the path to your home. Use bright outdoor lighting. Clear any walking paths of anything that might make someone trip, such as rocks or tools. Regularly check to see if handrails are loose or broken. Make sure that both sides of any steps have handrails. Any raised decks and porches should have guardrails on the edges. Have any leaves, snow, or ice cleared regularly. Use sand or salt on walking paths during winter. Clean up any spills in your garage right away. This includes oil or grease spills. What can I do in the bathroom? Use night lights. Install grab bars by the toilet and in the tub and shower. Do not use towel bars as grab bars. Use non-skid mats or decals in the tub or shower. If you need to sit down in the shower, use a plastic, non-slip stool. Keep the floor dry. Clean up any water that spills on the floor as soon as it happens. Remove soap buildup in the tub or shower regularly. Attach bath mats securely with double-sided non-slip rug tape. Do not have throw rugs and other things on the floor that can make you trip. What can I do in the bedroom? Use night lights. Make sure that you have a light by your bed that  is easy to reach. Do not use any sheets or blankets that are too big for your bed. They should not hang down onto the floor. Have a firm chair that has side arms. You can use this for support while you get dressed. Do not have throw rugs and other things on the floor that can make you trip. What can I do in the kitchen? Clean up any spills right away. Avoid walking on wet floors. Keep items that you use a lot in easy-to-reach places. If you need to reach something above you, use a strong step stool that has a grab bar. Keep electrical cords out of the way. Do not use floor polish or wax that makes floors slippery. If you must use wax, use non-skid floor wax. Do not have throw rugs and other things on the floor that can make you trip. What can I do with my stairs? Do not leave any items on the stairs. Make sure that there are handrails on both sides of the stairs and use them. Fix handrails that are broken or loose. Make sure that handrails are as long as the stairways. Check any carpeting to make sure that it is firmly attached to the stairs. Fix any carpet that is loose or worn. Avoid having throw rugs at the top or bottom of the stairs. If you do have throw  rugs, attach them to the floor with carpet tape. Make sure that you have a light switch at the top of the stairs and the bottom of the stairs. If you do not have them, ask someone to add them for you. What else can I do to help prevent falls? Wear shoes that: Do not have high heels. Have rubber bottoms. Are comfortable and fit you well. Are closed at the toe. Do not wear sandals. If you use a stepladder: Make sure that it is fully opened. Do not climb a closed stepladder. Make sure that both sides of the stepladder are locked into place. Ask someone to hold it for you, if possible. Clearly mark and make sure that you can see: Any grab bars or handrails. First and last steps. Where the edge of each step is. Use tools that help you  move around (mobility aids) if they are needed. These include: Canes. Walkers. Scooters. Crutches. Turn on the lights when you go into a dark area. Replace any light bulbs as soon as they burn out. Set up your furniture so you have a clear path. Avoid moving your furniture around. If any of your floors are uneven, fix them. If there are any pets around you, be aware of where they are. Review your medicines with your doctor. Some medicines can make you feel dizzy. This can increase your chance of falling. Ask your doctor what other things that you can do to help prevent falls. This information is not intended to replace advice given to you by your health care provider. Make sure you discuss any questions you have with your health care provider. Document Released: 08/13/2009 Document Revised: 03/24/2016 Document Reviewed: 11/21/2014 Elsevier Interactive Patient Education  2017 [employer].

## 2021-11-16 DIAGNOSIS — Z961 Presence of intraocular lens: Secondary | ICD-10-CM | POA: Diagnosis not present

## 2021-11-16 DIAGNOSIS — H04123 Dry eye syndrome of bilateral lacrimal glands: Secondary | ICD-10-CM | POA: Diagnosis not present

## 2021-11-19 ENCOUNTER — Other Ambulatory Visit: Payer: Self-pay | Admitting: Nurse Practitioner

## 2021-11-23 ENCOUNTER — Other Ambulatory Visit: Payer: Self-pay | Admitting: *Deleted

## 2021-11-23 DIAGNOSIS — M545 Low back pain, unspecified: Secondary | ICD-10-CM

## 2021-11-23 DIAGNOSIS — M159 Polyosteoarthritis, unspecified: Secondary | ICD-10-CM

## 2021-11-23 MED ORDER — TRAMADOL HCL 50 MG PO TABS: ORAL_TABLET | ORAL | 0 refills | Status: DC

## 2021-11-23 NOTE — Telephone Encounter (Signed)
Patient called requesting refill.  Epic LR: 09/28/2021 Contract Date: 03/15/2021 Pended Rx and sent to [facility] for approval.

## 2021-11-25 ENCOUNTER — Other Ambulatory Visit: Payer: Self-pay | Admitting: Nurse Practitioner

## 2021-12-12 ENCOUNTER — Other Ambulatory Visit: Payer: Self-pay | Admitting: Nurse Practitioner

## 2021-12-12 DIAGNOSIS — E7800 Pure hypercholesterolemia, unspecified: Secondary | ICD-10-CM

## 2021-12-20 ENCOUNTER — Telehealth (INDEPENDENT_AMBULATORY_CARE_PROVIDER_SITE_OTHER): Payer: Medicare HMO | Admitting: Nurse Practitioner

## 2021-12-20 ENCOUNTER — Other Ambulatory Visit: Payer: Self-pay

## 2021-12-20 ENCOUNTER — Encounter: Payer: Self-pay | Admitting: Nurse Practitioner

## 2021-12-20 DIAGNOSIS — G2581 Restless legs syndrome: Secondary | ICD-10-CM | POA: Diagnosis not present

## 2021-12-20 DIAGNOSIS — E7800 Pure hypercholesterolemia, unspecified: Secondary | ICD-10-CM

## 2021-12-20 DIAGNOSIS — E038 Other specified hypothyroidism: Secondary | ICD-10-CM | POA: Diagnosis not present

## 2021-12-20 DIAGNOSIS — M159 Polyosteoarthritis, unspecified: Secondary | ICD-10-CM

## 2021-12-20 DIAGNOSIS — I10 Essential (primary) hypertension: Secondary | ICD-10-CM

## 2021-12-20 DIAGNOSIS — K219 Gastro-esophageal reflux disease without esophagitis: Secondary | ICD-10-CM

## 2021-12-20 DIAGNOSIS — R600 Localized edema: Secondary | ICD-10-CM | POA: Diagnosis not present

## 2021-12-20 MED ORDER — HYDROCHLOROTHIAZIDE 12.5 MG PO CAPS: 12.5000 mg | ORAL_CAPSULE | Freq: Every day | ORAL | 3 refills | Status: DC

## 2021-12-20 NOTE — Progress Notes (Signed)
[name]: Patient Care Team: [name], NP as PCP - General (Nurse Practitioner)  Advanced Directive information Does Patient Have a Medical Advance Directive?: Yes, Type of Advance Directive: [place], Does patient want to make changes to medical advance directive?: No - Patient declined  No Known Allergies  Chief Complaint  Patient presents with   Medical Management of Chronic Issues    6 month follow-up.      HPI: Patient is a 76 y.o. female for routine follow up.  She came in office last month for LE edema. No longer having any issues. On hctz 12.5 mg   Hypothyroid- TSH at goal in December   OA- using mobic 7.5 mg daily with Voltaren gel and tramadol and tylenol  She is using tramadol and tylenol twice daily.  Pain primarily in left shoulder down to left arm. Pain is steady but some days are worse than other.   Hyperlipidemia- LDL 109 on last labs, continues on lipitor and dietary modifications.   GERD- no indigestion, on omeprazole daily  Constipation- controlled on linzess.   Has appt for mammogram and bone density.   Reports appetite is less. Good energy. Has lost weight.   Has been off requip- Resless leg controlled.   Review of Systems:  Review of Systems  Constitutional:  Negative for chills, fever and weight loss.  HENT:  Negative for tinnitus.   Respiratory:  Negative for cough, sputum production and shortness of breath.   Cardiovascular:  Negative for chest pain, palpitations and leg swelling.  Gastrointestinal:  Negative for abdominal pain, constipation (controlled on medications.), diarrhea and heartburn.  Genitourinary:  Negative for dysuria, frequency and urgency.  Musculoskeletal:  Positive for joint pain. Negative for back pain, falls and myalgias.  Skin: Negative.   Neurological:  Negative for dizziness and headaches.  Psychiatric/Behavioral:  Negative for depression and memory loss. The patient does not have  insomnia.    Past Medical History:  Diagnosis Date   Allergic rhinitis    Anxiety    Arthritis    Atrophic vaginitis 06/25/2019   Breast screening 10/26/2010   Last Assessment & Plan:  Formatting of this note might be different from the original. Will order a mammogram for this fall;  Follow up with me around Thanksgiving.   Callus of heel 02/19/2013   Colon polyps    adenomatous   Constipation 08/20/2013   Corns and callosities    Diverticulosis    Dysuria 06/03/2011   Last Assessment & Plan:  Formatting of this note might be different from the original. New, acute complaint today;  Will check urinalysis with her labs today;   GERD (gastroesophageal reflux disease)    Glaucoma    H/O total hysterectomy 02/19/2013   Hallux valgus 10/26/2010   Last Assessment & Plan:  Formatting of this note might be different from the original. Had surgery with Dr Jomarie Longs back in October;   Will have her follow up with him.   Heartburn 04/06/2093   Helicobacter pylori gastritis 08/12/2014   Hiatal hernia with gastroesophageal reflux 12/23/2014   Hypercholesteremia    no meds   Hypercholesterolemia 06/03/2011   Overview:  TC 205 (elevated) in 2010  Last Assessment & Plan:  Formatting of this note might be different from the original. Lab Results  Component Value Date   CHOL 205 12/09/2011   CHOL 233 06/03/2011   CHOL 205 05/26/2009   Lab Results  Component Value Date   HDL 82  12/09/2011   HDL 90 06/03/2011   HDL 75 05/26/2009   Lab Results  Component Value Date   LDLCALC 113 12/09/2011   [place] 133 06/03/2011   LDLCA   Hypothyroidism    Left-sided low back pain with left-sided sciatica 08/12/2014   Neck pain 10/26/2010   Obesity    Osteoarthritis 10/26/2010   Osteopenia 01/08/2014   Overweight 10/26/2010   Postmenopausal estrogen deficiency 02/19/2013   Recurrent UTI 06/25/2019   Restless leg syndrome 08/20/2013   RLS (restless legs syndrome) 07/08/2014   Routine general medical examination at a health care facility  02/19/2013   S/P carpal tunnel release 10/26/2010   Screen for colon cancer 12/16/2011   Last Assessment & Plan:  Formatting of this note might be different from the original. Refer to GI;   Screening for diabetes mellitus    Shoulder pain, right 12/16/2011   Last Assessment & Plan:  Formatting of this note might be different from the original. We have tried physical therapy;   We shot and reviewed films today;  She has  1)  Osteopenia 2)  Small spur at humeral head 3)  Some hypertrophic/lipping changes at the inferior glenoid rim;   I also injected it last visit;  She is on NSAIDS and tylenol;  Will trial voltaren gel;  Then get ortho opinion;   Skin spots-aging 07/08/2014   Urge incontinence    Whiplash injury 06/26/2018   Past Surgical History:  Procedure Laterality Date   ABDOMINAL HYSTERECTOMY  1985   Dr Candace Gallus Right    Dr Jomarie Longs   CARPAL TUNNEL RELEASE Bilateral 2010   Dr Laurance Flatten   CATARACT EXTRACTION Bilateral    CESAREAN SECTION  1983   1 time   COLONOSCOPY     ROTATOR CUFF REPAIR Right    Dr Theda Sers   THYROIDECTOMY  1987   Dr Guadlupe Spanish   TONSILLECTOMY  1951   Social History:   reports that she quit smoking about 35 years ago. Her smoking use included cigarettes. She has never used smokeless tobacco. She reports that she does not drink alcohol and does not use drugs.  Family History  Problem Relation Age of Onset   Leukemia Mother 75   Lung cancer Father    Diabetes Brother    COPD Brother    Lung cancer Brother    Diabetes Maternal Grandmother    Cancer Maternal Grandfather        unknown type   Breast cancer Maternal Aunt    Colon polyps Neg Hx    Rectal cancer Neg Hx     Medications: Patient's Medications  New Prescriptions   No medications on file  Previous Medications   ACETAMINOPHEN (TYLENOL) 500 MG TABLET    Take 1,000 mg by mouth every 8 (eight) hours as needed for mild pain.   ASCORBIC ACID (VITAMIN C PO)    Take 1 tablet by mouth daily.    ASPIRIN EC 81 MG TABLET    Take 81 mg by mouth every other day. Swallow whole.   ATORVASTATIN (LIPITOR) 10 MG TABLET    TAKE 1/2 TABLET BY MOUTH DAILY   CALCIUM-VITAMIN D PO    Take 2 tablets by mouth daily.    CRANBERRY PO    Take 1 tablet by mouth daily.   DICLOFENAC SODIUM (VOLTAREN) 1 % GEL    APPLY 2 GRAMS TOPICALLY 4 (FOUR) TIMES DAILY AS NEEDED.   HYDROCHLOROTHIAZIDE (MICROZIDE) 12.5 MG CAPSULE  Take 12.5 mg by mouth daily.   HYDROXYPROPYL METHYLCELLULOSE / HYPROMELLOSE (ISOPTO TEARS / GONIOVISC) 2.5 % OPHTHALMIC SOLUTION    Place 1 drop into both eyes as needed for dry eyes.    LEVOTHYROXINE (SYNTHROID) 25 MCG TABLET    TAKE 1 TABLET BY MOUTH EVERY DAY BEFORE BREAKFAST   LINZESS 145 MCG CAPS CAPSULE    TAKE 1 CAPSULE (145 MCG TOTAL) BY MOUTH DAILY BEFORE BREAKFAST. OFFICE VISIT FOR FURTHER REFILLS   MELOXICAM (MOBIC) 7.5 MG TABLET    TAKE 1 TABLET BY MOUTH EVERY DAY AS NEEDED FOR PAIN   MULTIPLE VITAMIN (MULTIVITAMIN WITH MINERALS) TABS    Take 1 tablet by mouth daily.   OMEPRAZOLE (PRILOSEC) 40 MG CAPSULE    TAKE 1 CAPSULE BY MOUTH EVERY DAY   TRAMADOL (ULTRAM) 50 MG TABLET    Take one tablet by mouth every 6 hours as needed for pain  Modified Medications   No medications on file  Discontinued Medications   ASPIRIN 81 MG EC TABLET    Take 1 tablet (81 mg total) by mouth daily. Swallow whole.    Physical Exam:  There were no vitals filed for this visit. There is no height or weight on file to calculate BMI. Wt Readings from Last 3 Encounters:  10/04/21 169 lb (76.7 kg)  06/16/21 161 lb (73 kg)  03/15/21 164 lb (74.4 kg)    Physical Exam Constitutional:      Appearance: Normal appearance.  Neurological:     Mental Status: She is alert and oriented to person, place, and time. Mental status is at baseline.  Psychiatric:        Mood and Affect: Mood normal.    Labs reviewed: Basic Metabolic Panel: Recent Labs    03/15/21 0938 06/16/21 1019 10/04/21 1555  NA 145 144   --   K 3.9 4.4  --   CL 109 108  --   CO2 28 29  --   GLUCOSE 92 90  --   BUN 17 18  --   CREATININE 0.90 0.92  --   CALCIUM 10.0 9.5  --   TSH 0.78  --  1.17   Liver Function Tests: Recent Labs    03/15/21 0938 06/16/21 1019  AST 26 26  ALT 13 13  BILITOT 0.4 0.4  PROT 6.5 6.5   No results for input(s): LIPASE, AMYLASE in the last 8760 hours. No results for input(s): AMMONIA in the last 8760 hours. CBC: Recent Labs    03/15/21 0938 06/16/21 1019  WBC 3.3* 3.2*  NEUTROABS 1,409* 1,165*  HGB 10.9* 11.0*  HCT 35.0 34.6*  MCV 96.7 95.8  PLT 234 286   Lipid Panel: Recent Labs    03/15/21 0938  CHOL 210*  HDL 90  LDLCALC 106*  TRIG 62  CHOLHDL 2.3   TSH: Recent Labs    03/15/21 0938 10/04/21 1555  TSH 0.78 1.17   A1C: Lab Results  Component Value Date   HGBA1C 5.6 07/05/2014     Assessment/Plan 1. Bilateral lower extremity edema Improved at this time.  - hydrochlorothiazide (MICROZIDE) 12.5 MG capsule; Take 1 capsule (12.5 mg total) by mouth daily.  Dispense: 90 capsule; Refill: 3 - CMP with eGFR([facility]); Future  2. Hypercholesterolemia -continue lipitor 1/2 tablet daily with dietary modifications.  - CMP with eGFR([facility]); Future - Lipid panel; Future  3. Primary hypertension Blood pressure well controlled Continue current medications Recheck metabolic panel - hydrochlorothiazide (MICROZIDE) 12.5 MG capsule; Take 1  capsule (12.5 mg total) by mouth daily.  Dispense: 90 capsule; Refill: 3 - CMP with eGFR([facility]); Future - CBC with Differential/Platelet; Future  4. Primary osteoarthritis involving multiple joints -stable on current regimen.  -will use mobic PRN - CMP with eGFR([facility]); Future  5. Gastroesophageal reflux disease without esophagitis Controlled on omprazole.   6. Other specified hypothyroidism TSH at goal on last lab, continues synthroid  7. Restless leg syndrome -off medication doing well.    Next appt: 4 months.   Carlos American. Harle Battiest  [facility] [phone]    Virtual Visit via mychart video  I connected with patient on 12/20/21 at  9:30 AM EST by  video and verified that I am speaking with the correct person using two identifiers.  Location: Patient: home Provider: [place]   I discussed the limitations, risks, security and privacy concerns of performing an evaluation and management service by telephone and the availability of in person appointments. I also discussed with the patient that there may be a patient responsible charge related to this service. The patient expressed understanding and agreed to proceed.   I discussed the assessment and treatment plan with the patient. The patient was provided an opportunity to ask questions and all were answered. The patient agreed with the plan and demonstrated an understanding of the instructions.   The patient was advised to call back or seek an in-person evaluation if the symptoms worsen or if the condition fails to improve as anticipated.  I provided 21 minutes of non-face-to-face time during this encounter.  Carlos American. Harle Battiest Avs printed and mailed

## 2021-12-20 NOTE — Progress Notes (Signed)
° °  This service is provided via telemedicine  No vital signs collected/recorded due to the encounter was a telemedicine visit.   Location of patient (ex: home, work):  Home  Patient consents to a telephone visit: Yes, see telephone visit dated 11/11/2021  Location of the provider (ex: office, home):  [facility], Office   Name of any referring provider:  N/A  Names of all persons participating in the telemedicine service and their role in the encounter:  S.Chrae B/CMA, Sherrie Mustache, NP, and Patient   Time spent on call:  7 min with medical assistant

## 2021-12-24 ENCOUNTER — Telehealth: Payer: Self-pay

## 2021-12-24 NOTE — Telephone Encounter (Signed)
Unable to give any medication without a visit, if she is in severe pain would recommend urgent care visit.

## 2021-12-24 NOTE — Telephone Encounter (Signed)
Daughter called asking if mother could get something for shoulder pain. She states that mother had severe pain and was crying in pain. She has been taking Tramadol, tylenol, using voltaren gel and heating pad with no relief. Daughter states that she gave her mother a norco tablet 5/325mg  tablet to take and states that worked for the pain. She is scheduled for telephone visit 12/27/2021 to discuss this. She would like to know if you could give her something for the pain just for the weekend. Please advise.  Message routed to Sherrie Mustache, NP

## 2021-12-27 ENCOUNTER — Other Ambulatory Visit: Payer: Self-pay | Admitting: Nurse Practitioner

## 2021-12-27 ENCOUNTER — Telehealth: Payer: Medicare HMO | Admitting: Nurse Practitioner

## 2021-12-27 ENCOUNTER — Other Ambulatory Visit: Payer: Self-pay

## 2021-12-27 DIAGNOSIS — M159 Polyosteoarthritis, unspecified: Secondary | ICD-10-CM

## 2021-12-27 NOTE — Telephone Encounter (Signed)
Patient has request refill on medications "Diclofenac Sodium" last refill 10/05/2021, and "Linzess" last refill 07/06/2021. Patient medications have warning. Medications pend and sent to PCP Dewaine Oats Carlos American, NP for approval.

## 2021-12-28 ENCOUNTER — Other Ambulatory Visit: Payer: Medicare HMO

## 2021-12-30 ENCOUNTER — Other Ambulatory Visit: Payer: Self-pay

## 2021-12-30 ENCOUNTER — Ambulatory Visit (INDEPENDENT_AMBULATORY_CARE_PROVIDER_SITE_OTHER): Payer: Medicare HMO | Admitting: Nurse Practitioner

## 2021-12-30 ENCOUNTER — Other Ambulatory Visit: Payer: Medicare HMO

## 2021-12-30 ENCOUNTER — Encounter: Payer: Self-pay | Admitting: Nurse Practitioner

## 2021-12-30 VITALS — BP 143/78 | HR 88 | Temp 97.5°F | Ht 61.0 in | Wt 149.6 lb

## 2021-12-30 DIAGNOSIS — M25512 Pain in left shoulder: Secondary | ICD-10-CM

## 2021-12-30 DIAGNOSIS — G8929 Other chronic pain: Secondary | ICD-10-CM

## 2021-12-30 DIAGNOSIS — I10 Essential (primary) hypertension: Secondary | ICD-10-CM | POA: Diagnosis not present

## 2021-12-30 DIAGNOSIS — M159 Polyosteoarthritis, unspecified: Secondary | ICD-10-CM | POA: Diagnosis not present

## 2021-12-30 DIAGNOSIS — R600 Localized edema: Secondary | ICD-10-CM

## 2021-12-30 DIAGNOSIS — M545 Low back pain, unspecified: Secondary | ICD-10-CM

## 2021-12-30 DIAGNOSIS — E7800 Pure hypercholesterolemia, unspecified: Secondary | ICD-10-CM | POA: Diagnosis not present

## 2021-12-30 LAB — CBC WITH DIFFERENTIAL/PLATELET
Absolute Monocytes: 230 cells/uL (ref 200–950)
Basophils Absolute: 20 cells/uL (ref 0–200)
Basophils Relative: 0.5 %
Eosinophils Absolute: 70 cells/uL (ref 15–500)
Eosinophils Relative: 1.8 %
HCT: 35.3 % (ref 35.0–45.0)
Hemoglobin: 11.2 g/dL — ABNORMAL LOW (ref 11.7–15.5)
Lymphs Abs: 1763 cells/uL (ref 850–3900)
MCH: 30.6 pg (ref 27.0–33.0)
MCHC: 31.7 g/dL — ABNORMAL LOW (ref 32.0–36.0)
MCV: 96.4 fL (ref 80.0–100.0)
MPV: 11.8 fL (ref 7.5–12.5)
Monocytes Relative: 5.9 %
Neutro Abs: 1817 cells/uL (ref 1500–7800)
Neutrophils Relative %: 46.6 %
Platelets: 266 10*3/uL (ref 140–400)
RBC: 3.66 10*6/uL — ABNORMAL LOW (ref 3.80–5.10)
RDW: 12.4 % (ref 11.0–15.0)
Total Lymphocyte: 45.2 %
WBC: 3.9 10*3/uL (ref 3.8–10.8)

## 2021-12-30 LAB — COMPLETE METABOLIC PANEL WITH GFR
AG Ratio: 1.6 (calc) (ref 1.0–2.5)
ALT: 16 U/L (ref 6–29)
AST: 29 U/L (ref 10–35)
Albumin: 4.2 g/dL (ref 3.6–5.1)
Alkaline phosphatase (APISO): 85 U/L (ref 37–153)
BUN: 16 mg/dL (ref 7–25)
CO2: 31 mmol/L (ref 20–32)
Calcium: 10 mg/dL (ref 8.6–10.4)
Chloride: 104 mmol/L (ref 98–110)
Creat: 0.94 mg/dL (ref 0.60–1.00)
Globulin: 2.6 g/dL (calc) (ref 1.9–3.7)
Glucose, Bld: 96 mg/dL (ref 65–99)
Potassium: 4.2 mmol/L (ref 3.5–5.3)
Sodium: 145 mmol/L (ref 135–146)
Total Bilirubin: 0.4 mg/dL (ref 0.2–1.2)
Total Protein: 6.8 g/dL (ref 6.1–8.1)
eGFR: 62 mL/min/{1.73_m2} (ref >=60)

## 2021-12-30 LAB — LIPID PANEL
Cholesterol: 187 mg/dL (ref <200)
HDL: 84 mg/dL (ref >=50)
LDL Cholesterol (Calc): 87 mg/dL (calc)
Non-HDL Cholesterol (Calc): 103 mg/dL (calc) (ref <130)
Total CHOL/HDL Ratio: 2.2 (calc) (ref <5.0)
Triglycerides: 71 mg/dL (ref <150)

## 2021-12-30 MED ORDER — TRAMADOL HCL 50 MG PO TABS: ORAL_TABLET | ORAL | 0 refills | Status: DC

## 2021-12-30 MED ORDER — METHOCARBAMOL 500 MG PO TABS: 500.0000 mg | ORAL_TABLET | Freq: Three times a day (TID) | ORAL | 0 refills | Status: DC | PRN

## 2021-12-30 MED ORDER — PREDNISONE 10 MG (21) PO TBPK
ORAL_TABLET | ORAL | 0 refills | Status: DC
Start: 2021-12-30 — End: 2022-04-22

## 2021-12-30 MED ORDER — KETOROLAC TROMETHAMINE 30 MG/ML IJ SOLN
30.0000 mg | Freq: Once | INTRAMUSCULAR | Status: AC
  Administered 2021-12-30: 30 mg via INTRAMUSCULAR

## 2021-12-30 NOTE — Progress Notes (Signed)
[name]: Patient Care Team: Colleen Chandler, NP as PCP - General (Nurse Practitioner)  PLACE OF SERVICE:  [place] Directive information    No Known Allergies  Chief Complaint  Patient presents with   Acute Visit    Patient complains of left shoulder pain not getting better and medication is not helping. Patient increased Tramadol over the weekend. Patient states that pain level is about an 8 at this time. Shoulder is swollen.     HPI: Patient is a 76 y.o. female for acute shoulder pain. She had a shoulder injection on January 2nd. PA that saw her said it should last 3 month but last week pain was severe. She is getting into orthopedic on 01/06/21.  She increased her tramadol to 2 tablets but this has helped the pain. (Max 300 mg daily) not having to wake up at night to take it.  She is now agreeable to intervention due to pain.  Pain is a 10/10.  Very tense in the shoulder. Some edema noted.  No injury  Review of Systems:  Review of Systems  Constitutional:  Negative for chills and fever.  Cardiovascular:  Negative for chest pain and palpitations.  Musculoskeletal:  Positive for back pain, joint pain, myalgias and neck pain. Negative for falls.   Past Medical History:  Diagnosis Date   Allergic rhinitis    Anxiety    Arthritis    Atrophic vaginitis 06/25/2019   Breast screening 10/26/2010   Last Assessment & Plan:  Formatting of this note might be different from the original. Will order a mammogram for this fall;  Follow up with me around Thanksgiving.   Callus of heel 02/19/2013   Colon polyps    adenomatous   Constipation 08/20/2013   Corns and callosities    Diverticulosis    Dysuria 06/03/2011   Last Assessment & Plan:  Formatting of this note might be different from the original. New, acute complaint today;  Will check urinalysis with her labs today;   GERD (gastroesophageal reflux disease)    Glaucoma    H/O total hysterectomy 02/19/2013    Hallux valgus 10/26/2010   Last Assessment & Plan:  Formatting of this note might be different from the original. Had surgery with Dr Jomarie Longs back in October;   Will have her follow up with him.   Heartburn 11/05/1094   Helicobacter pylori gastritis 08/12/2014   Hiatal hernia with gastroesophageal reflux 12/23/2014   Hypercholesteremia    no meds   Hypercholesterolemia 06/03/2011   Overview:  TC 205 (elevated) in 2010  Last Assessment & Plan:  Formatting of this note might be different from the original. Lab Results  Component Value Date   CHOL 205 12/09/2011   CHOL 233 06/03/2011   CHOL 205 05/26/2009   Lab Results  Component Value Date   HDL 82 12/09/2011   HDL 90 06/03/2011   HDL 75 05/26/2009   Lab Results  Component Value Date   LDLCALC 113 12/09/2011   [place] 133 06/03/2011   LDLCA   Hypothyroidism    Left-sided low back pain with left-sided sciatica 08/12/2014   Neck pain 10/26/2010   Obesity    Osteoarthritis 10/26/2010   Osteopenia 01/08/2014   Overweight 10/26/2010   Postmenopausal estrogen deficiency 02/19/2013   Recurrent UTI 06/25/2019   Restless leg syndrome 08/20/2013   RLS (restless legs syndrome) 07/08/2014   Routine general medical examination at a health care facility 02/19/2013   S/P carpal tunnel  release 10/26/2010   Screen for colon cancer 12/16/2011   Last Assessment & Plan:  Formatting of this note might be different from the original. Refer to GI;   Screening for diabetes mellitus    Shoulder pain, right 12/16/2011   Last Assessment & Plan:  Formatting of this note might be different from the original. We have tried physical therapy;   We shot and reviewed films today;  She has  1)  Osteopenia 2)  Small spur at humeral head 3)  Some hypertrophic/lipping changes at the inferior glenoid rim;   I also injected it last visit;  She is on NSAIDS and tylenol;  Will trial voltaren gel;  Then get ortho opinion;   Skin spots-aging 07/08/2014   Urge incontinence    Whiplash injury 06/26/2018   Past  Surgical History:  Procedure Laterality Date   ABDOMINAL HYSTERECTOMY  1985   Dr Candace Gallus Right    Dr Jomarie Longs   CARPAL TUNNEL RELEASE Bilateral 2010   Dr Laurance Flatten   CATARACT EXTRACTION Bilateral    CESAREAN SECTION  1983   1 time   COLONOSCOPY     ROTATOR CUFF REPAIR Right    Dr Theda Sers   THYROIDECTOMY  1987   Dr Guadlupe Spanish   TONSILLECTOMY  1951   Social History:   reports that she quit smoking about 35 years ago. Her smoking use included cigarettes. She has never used smokeless tobacco. She reports that she does not drink alcohol and does not use drugs.  Family History  Problem Relation Age of Onset   Leukemia Mother 73   Lung cancer Father    Diabetes Brother    COPD Brother    Lung cancer Brother    Diabetes Maternal Grandmother    Cancer Maternal Grandfather        unknown type   Breast cancer Maternal Aunt    Colon polyps Neg Hx    Rectal cancer Neg Hx     Medications: Patient's Medications  New Prescriptions   No medications on file  Previous Medications   ACETAMINOPHEN (TYLENOL) 500 MG TABLET    Take 1,000 mg by mouth every 8 (eight) hours as needed for mild pain.   ASCORBIC ACID (VITAMIN C PO)    Take 1 tablet by mouth daily.   ASPIRIN EC 81 MG TABLET    Take 81 mg by mouth every other day. Swallow whole.   ATORVASTATIN (LIPITOR) 10 MG TABLET    TAKE 1/2 TABLET BY MOUTH DAILY   CALCIUM-VITAMIN D PO    Take 2 tablets by mouth daily.    CRANBERRY PO    Take 1 tablet by mouth daily.   DICLOFENAC SODIUM (VOLTAREN) 1 % GEL    APPLY 2 GRAMS TOPICALLY 4 (FOUR) TIMES DAILY AS NEEDED.   HYDROCHLOROTHIAZIDE (MICROZIDE) 12.5 MG CAPSULE    Take 1 capsule (12.5 mg total) by mouth daily.   HYDROXYPROPYL METHYLCELLULOSE / HYPROMELLOSE (ISOPTO TEARS / GONIOVISC) 2.5 % OPHTHALMIC SOLUTION    Place 1 drop into both eyes as needed for dry eyes.    LEVOTHYROXINE (SYNTHROID) 25 MCG TABLET    TAKE 1 TABLET BY MOUTH EVERY DAY BEFORE BREAKFAST   LINZESS 145 MCG CAPS CAPSULE     TAKE 1 CAPSULE (145 MCG TOTAL) BY MOUTH DAILY BEFORE BREAKFAST. OFFICE VISIT FOR FURTHER REFILLS   MULTIPLE VITAMIN (MULTIVITAMIN WITH MINERALS) TABS    Take 1 tablet by mouth daily.   OMEPRAZOLE (PRILOSEC) 40 MG CAPSULE  TAKE 1 CAPSULE BY MOUTH EVERY DAY   TRAMADOL (ULTRAM) 50 MG TABLET    Take one tablet by mouth every 6 hours as needed for pain  Modified Medications   No medications on file  Discontinued Medications   MELOXICAM (MOBIC) 7.5 MG TABLET    TAKE 1 TABLET BY MOUTH EVERY DAY AS NEEDED FOR PAIN    Physical Exam:  Vitals:   12/30/21 0829  BP: (!) 143/78  Pulse: 88  Temp: (!) 97.5 F (36.4 C)  SpO2: 95%  Weight: 149 lb 9.6 oz (67.9 kg)  Height: 5\' 1"  (1.549 m)   Body mass index is 28.27 kg/m. Wt Readings from Last 3 Encounters:  12/30/21 149 lb 9.6 oz (67.9 kg)  10/04/21 169 lb (76.7 kg)  06/16/21 161 lb (73 kg)    Physical Exam Constitutional:      Appearance: Normal appearance.  Musculoskeletal:     Right shoulder: No swelling. Normal range of motion.     Left shoulder: Swelling, tenderness and bony tenderness present. Decreased range of motion. Decreased strength.  Skin:    General: Skin is warm and dry.  Neurological:     Mental Status: She is alert.    Labs reviewed: Basic Metabolic Panel: Recent Labs    03/15/21 0938 06/16/21 1019 10/04/21 1555  NA 145 144  --   K 3.9 4.4  --   CL 109 108  --   CO2 28 29  --   GLUCOSE 92 90  --   BUN 17 18  --   CREATININE 0.90 0.92  --   CALCIUM 10.0 9.5  --   TSH 0.78  --  1.17   Liver Function Tests: Recent Labs    03/15/21 0938 06/16/21 1019  AST 26 26  ALT 13 13  BILITOT 0.4 0.4  PROT 6.5 6.5   No results for input(s): LIPASE, AMYLASE in the last 8760 hours. No results for input(s): AMMONIA in the last 8760 hours. CBC: Recent Labs    03/15/21 0938 06/16/21 1019  WBC 3.3* 3.2*  NEUTROABS 1,409* 1,165*  HGB 10.9* 11.0*  HCT 35.0 34.6*  MCV 96.7 95.8  PLT 234 286   Lipid  Panel: Recent Labs    03/15/21 0938  CHOL 210*  HDL 90  LDLCALC 106*  TRIG 62  CHOLHDL 2.3   TSH: Recent Labs    03/15/21 0938 10/04/21 1555  TSH 0.78 1.17   A1C: Lab Results  Component Value Date   HGBA1C 5.6 07/05/2014     Assessment/Plan 1. Acute pain of left shoulder Significant pain noted, has follow up with orthopedic next week.  -ice TID - predniSONE (STERAPRED UNI-PAK 21 TAB) 10 MG (21) TBPK tablet; Use as directed  Dispense: 21 tablet; Refill: 0 - traMADol (ULTRAM) 50 MG tablet; Take 1-2 tablet by mouth every 6 hours as needed for pain- max 6 in 24 hours  Dispense: 180 tablet; Refill: 0 - methocarbamol (ROBAXIN) 500 MG tablet; Take 1 tablet (500 mg total) by mouth every 8 (eight) hours as needed for muscle spasms.  Dispense: 30 tablet; Refill: 0 - ketorolac (TORADOL) 30 MG/ML injection 30 mg given today due to severe pain.   2. Primary osteoarthritis involving multiple joints ongoing - traMADol (ULTRAM) 50 MG tablet; Take 1-2 tablet by mouth every 6 hours as needed for pain- max 6 in 24 hours  Dispense: 180 tablet; Refill: 0  3. Chronic left-sided low back pain without sciatica Ongoing, stable.  - traMADol (  ULTRAM) 50 MG tablet; Take 1-2 tablet by mouth every 6 hours as needed for pain- max 6 in 24 hours  Dispense: 180 tablet; Refill: 0   Eli Adami K. [place], [facility] [phone]

## 2022-01-06 DIAGNOSIS — S46002A Unspecified injury of muscle(s) and tendon(s) of the rotator cuff of left shoulder, initial encounter: Secondary | ICD-10-CM | POA: Diagnosis not present

## 2022-01-06 DIAGNOSIS — M25512 Pain in left shoulder: Secondary | ICD-10-CM | POA: Diagnosis not present

## 2022-01-11 ENCOUNTER — Encounter: Payer: Self-pay | Admitting: Nurse Practitioner

## 2022-01-13 MED ORDER — MELOXICAM 7.5 MG PO TABS
7.5000 mg | ORAL_TABLET | Freq: Every day | ORAL | 3 refills | Status: DC
Start: 2022-01-13 — End: 2022-04-22

## 2022-01-13 NOTE — Telephone Encounter (Signed)
Called patient and daughter, will restart mobic 7.5 mg by mouth daily to help with pain management. Will call back if pain remains uncontrolled.  ?

## 2022-01-16 DIAGNOSIS — M25512 Pain in left shoulder: Secondary | ICD-10-CM | POA: Diagnosis not present

## 2022-02-07 DIAGNOSIS — M75120 Complete rotator cuff tear or rupture of unspecified shoulder, not specified as traumatic: Secondary | ICD-10-CM

## 2022-02-07 DIAGNOSIS — M7520 Bicipital tendinitis, unspecified shoulder: Secondary | ICD-10-CM | POA: Insufficient documentation

## 2022-02-07 DIAGNOSIS — M19019 Primary osteoarthritis, unspecified shoulder: Secondary | ICD-10-CM

## 2022-02-07 DIAGNOSIS — M75122 Complete rotator cuff tear or rupture of left shoulder, not specified as traumatic: Secondary | ICD-10-CM | POA: Diagnosis not present

## 2022-02-07 DIAGNOSIS — M19012 Primary osteoarthritis, left shoulder: Secondary | ICD-10-CM | POA: Diagnosis not present

## 2022-02-07 HISTORY — DX: Primary osteoarthritis, unspecified shoulder: M19.019

## 2022-02-07 HISTORY — DX: Bicipital tendinitis, unspecified shoulder: M75.20

## 2022-02-07 HISTORY — DX: Complete rotator cuff tear or rupture of unspecified shoulder, not specified as traumatic: M75.120

## 2022-02-18 ENCOUNTER — Ambulatory Visit (INDEPENDENT_AMBULATORY_CARE_PROVIDER_SITE_OTHER): Payer: Medicare HMO | Admitting: Nurse Practitioner

## 2022-02-18 ENCOUNTER — Encounter: Payer: Self-pay | Admitting: Nurse Practitioner

## 2022-02-18 ENCOUNTER — Telehealth: Payer: Self-pay

## 2022-02-18 ENCOUNTER — Ambulatory Visit: Payer: Medicare HMO | Admitting: Nurse Practitioner

## 2022-02-18 VITALS — BP 130/72 | HR 55 | Temp 97.1°F | Ht 61.0 in | Wt 151.0 lb

## 2022-02-18 DIAGNOSIS — M25512 Pain in left shoulder: Secondary | ICD-10-CM | POA: Diagnosis not present

## 2022-02-18 DIAGNOSIS — R001 Bradycardia, unspecified: Secondary | ICD-10-CM | POA: Diagnosis not present

## 2022-02-18 DIAGNOSIS — Z01818 Encounter for other preprocedural examination: Secondary | ICD-10-CM

## 2022-02-18 NOTE — Telephone Encounter (Signed)
Patient's daughter Aldona Bar) called and states se would like to bring her mom back one day next week to have her heart rate rechecked to move forward with the surgical clearance. Aldona Bar reports she reached out to the cardiologist and he have not replied back to her yet. She said that the scheduler at the cardiologist advice her the patient was due for a follow up appointment and she had to schedule one for June,2023 but patient's surgery is in May. I advised her that a message will be send back to [place]. ?

## 2022-02-18 NOTE — Telephone Encounter (Signed)
Noted  

## 2022-02-18 NOTE — Progress Notes (Signed)
? ? ?[name]: ?Patient Care Team: ?Colleen Chandler, NP as PCP - General (Nurse Practitioner) ? ?PLACE OF SERVICE:  ?[facility]  ?Advanced Directive information ?Does Patient Have a Medical Advance Directive?: Yes, Type of Advance Directive: [name];Out of facility DNR (pink MOST or yellow form), Pre-existing out of facility DNR order (yellow form or pink MOST form): Pink MOST form placed in chart (order not valid for inpatient use), Does patient want to make changes to medical advance directive?: No - Patient declined ? ?No Known Allergies ? ?Chief Complaint  ?Patient presents with  ? Pre-op Exam  ?  Surgical clearance for left shoulder. Complete form for [facility].   ? ? ? ?HPI: Patient is a 76 y.o. female for pre op evaluation, she has form for [facility] will be completed.  ?She has had severe pain of the left shoulder, medication has not been effective. Planning to undergo left shoulder scope SAD/DCR/RCR possible  ?No recent illnesses, no issues with anesthesia in the past  ?Stays physically active with grandchildren.  ? ?Diet is balanced, eats 3 times per day, drinks an Ensure at least 30 grams of protein a day.  ? ?Only seen by ortho, no other specialist.  ? ?EKG performed this office visit. Bradycardia noted, she has no shortness of breath, chest pains dizziness or lightheadedness.  ?She has had a hx of DOE but this is stable. Echo and lexiscan WNL in Jan 2022 ? ?Review of Systems:  ?Review of Systems  ?Constitutional:  Negative for chills, diaphoresis, fever and malaise/fatigue.  ?Respiratory:  Negative for cough, hemoptysis and shortness of breath.   ?Cardiovascular:  Negative for chest pain, palpitations and leg swelling.  ?Gastrointestinal:  Negative for abdominal pain, constipation, diarrhea, heartburn, nausea and vomiting.  ?Genitourinary:  Negative for dysuria, frequency and urgency.  ?Musculoskeletal:  Positive for joint pain. Negative for myalgias.  ?     Left shoulder  pain   ?Neurological:  Negative for dizziness, tingling, tremors, loss of consciousness and headaches.  ?Psychiatric/Behavioral:  Negative for depression. The patient is not nervous/anxious and does not have insomnia.   ? ?Past Medical History:  ?Diagnosis Date  ? Allergic rhinitis   ? Anxiety   ? Arthritis   ? Atrophic vaginitis 06/25/2019  ? Breast screening 10/26/2010  ? Last Assessment & Plan:  Formatting of this note might be different from the original. Will order a mammogram for this fall;  Follow up with me around Thanksgiving.  ? Callus of heel 02/19/2013  ? Colon polyps   ? adenomatous  ? Constipation 08/20/2013  ? Corns and callosities   ? Diverticulosis   ? Dysuria 06/03/2011  ? Last Assessment & Plan:  Formatting of this note might be different from the original. New, acute complaint today;  Will check urinalysis with her labs today;  ? GERD (gastroesophageal reflux disease)   ? Glaucoma   ? H/O total hysterectomy 02/19/2013  ? Hallux valgus 10/26/2010  ? Last Assessment & Plan:  Formatting of this note might be different from the original. Had surgery with Dr Jomarie Longs back in October;   Will have her follow up with him.  ? Heartburn 04/30/2014  ? Helicobacter pylori gastritis 08/12/2014  ? Hiatal hernia with gastroesophageal reflux 12/23/2014  ? Hypercholesteremia   ? no meds  ? Hypercholesterolemia 06/03/2011  ? Overview:  TC 205 (elevated) in 2010  Last Assessment & Plan:  Formatting of this note might be different from the original. Lab Results  Component Value Date   CHOL 205 12/09/2011   CHOL 233 06/03/2011   CHOL 205 05/26/2009   Lab Results  Component Value Date   HDL 82 12/09/2011   HDL 90 06/03/2011   HDL 75 05/26/2009   Lab Results  Component Value Date   LDLCALC 113 12/09/2011   LDLCALC 133 06/03/2011   LDLCA  ? Hypothyroidism   ? Left-sided low back pain with left-sided sciatica 08/12/2014  ? Neck pain 10/26/2010  ? Obesity   ? Osteoarthritis 10/26/2010  ? Osteopenia 01/08/2014  ? Overweight 10/26/2010  ?  Postmenopausal estrogen deficiency 02/19/2013  ? Recurrent UTI 06/25/2019  ? Restless leg syndrome 08/20/2013  ? RLS (restless legs syndrome) 07/08/2014  ? Routine general medical examination at a health care facility 02/19/2013  ? S/P carpal tunnel release 10/26/2010  ? Screen for colon cancer 12/16/2011  ? Last Assessment & Plan:  Formatting of this note might be different from the original. Refer to GI;  ? Screening for diabetes mellitus   ? Shoulder pain, right 12/16/2011  ? Last Assessment & Plan:  Formatting of this note might be different from the original. We have tried physical therapy;   We shot and reviewed films today;  She has  1)  Osteopenia 2)  Small spur at humeral head 3)  Some hypertrophic/lipping changes at the inferior glenoid rim;   I also injected it last visit;  She is on NSAIDS and tylenol;  Will trial voltaren gel;  Then get ortho opinion;  ? Skin spots-aging 07/08/2014  ? Urge incontinence   ? Whiplash injury 06/26/2018  ? ?Past Surgical History:  ?Procedure Laterality Date  ? ABDOMINAL HYSTERECTOMY  1985  ? Dr Mancel Bale  ? BUNIONECTOMY Right   ? Dr Jomarie Longs  ? CARPAL TUNNEL RELEASE Bilateral 2010  ? Dr Laurance Flatten  ? CATARACT EXTRACTION Bilateral   ? [place]  ? 1 time  ? COLONOSCOPY    ? ROTATOR CUFF REPAIR Right   ? Dr Theda Sers  ? THYROIDECTOMY  1987  ? Dr Guadlupe Spanish  ? TONSILLECTOMY  1951  ? ?Social History: ?  reports that she quit smoking about 35 years ago. Her smoking use included cigarettes. She has never used smokeless tobacco. She reports that she does not drink alcohol and does not use drugs. ? ?Family History  ?Problem Relation Age of Onset  ? Leukemia Mother 39  ? Lung cancer Father   ? Diabetes Brother   ? COPD Brother   ? Lung cancer Brother   ? Diabetes Maternal Grandmother   ? Cancer Maternal Grandfather   ?     unknown type  ? Breast cancer Maternal Aunt   ? Colon polyps Neg Hx   ? Rectal cancer Neg Hx   ? ? ?Medications: ?Patient's Medications  ?New Prescriptions  ? No  medications on file  ?Previous Medications  ? ACETAMINOPHEN (TYLENOL) 500 MG TABLET    Take 1,000 mg by mouth every 8 (eight) hours as needed for mild pain.  ? ASCORBIC ACID (VITAMIN C PO)    Take 1 tablet by mouth daily.  ? ASPIRIN EC 81 MG TABLET    Take 81 mg by mouth every other day. Swallow whole.  ? ATORVASTATIN (LIPITOR) 10 MG TABLET    TAKE 1/2 TABLET BY MOUTH DAILY  ? CALCIUM-VITAMIN D PO    Take 2 tablets by mouth daily.   ? CRANBERRY PO    Take 1 tablet by mouth daily.  ?  DICLOFENAC SODIUM (VOLTAREN) 1 % GEL    APPLY 2 GRAMS TOPICALLY 4 (FOUR) TIMES DAILY AS NEEDED.  ? HYDROCHLOROTHIAZIDE (MICROZIDE) 12.5 MG CAPSULE    Take 1 capsule (12.5 mg total) by mouth daily.  ? HYDROXYPROPYL METHYLCELLULOSE / HYPROMELLOSE (ISOPTO TEARS / GONIOVISC) 2.5 % OPHTHALMIC SOLUTION    Place 1 drop into both eyes as needed for dry eyes.   ? LEVOTHYROXINE (SYNTHROID) 25 MCG TABLET    TAKE 1 TABLET BY MOUTH EVERY DAY BEFORE BREAKFAST  ? LINZESS 145 MCG CAPS CAPSULE    TAKE 1 CAPSULE (145 MCG TOTAL) BY MOUTH DAILY BEFORE BREAKFAST. OFFICE VISIT FOR FURTHER REFILLS  ? MELOXICAM (MOBIC) 7.5 MG TABLET    Take 1 tablet (7.5 mg total) by mouth daily.  ? MULTIPLE VITAMIN (MULTIVITAMIN WITH MINERALS) TABS    Take 1 tablet by mouth daily.  ? OMEPRAZOLE (PRILOSEC) 40 MG CAPSULE    TAKE 1 CAPSULE BY MOUTH EVERY DAY  ? PREDNISONE (STERAPRED UNI-PAK 21 TAB) 10 MG (21) TBPK TABLET    Use as directed  ? TRAMADOL (ULTRAM) 50 MG TABLET    Take 1-2 tablet by mouth every 6 hours as needed for pain- max 6 in 24 hours  ?Modified Medications  ? No medications on file  ?Discontinued Medications  ? METHOCARBAMOL (ROBAXIN) 500 MG TABLET    Take 1 tablet (500 mg total) by mouth every 8 (eight) hours as needed for muscle spasms.  ? ? ?Physical Exam: ? ?Vitals:  ? 02/18/22 0823  ?BP: 130/72  ?Pulse: (!) 55  ?Temp: (!) 97.1 ?F (36.2 ?C)  ?TempSrc: Temporal  ?SpO2: 95%  ?Weight: 151 lb (68.5 kg)  ?Height: '5\' 1"'$  (1.549 m)  ? ?Body mass index is 28.53  kg/m?. ?Wt Readings from Last 3 Encounters:  ?02/18/22 151 lb (68.5 kg)  ?12/30/21 149 lb 9.6 oz (67.9 kg)  ?10/04/21 169 lb (76.7 kg)  ? ? ?Physical Exam ?Constitutional:   ?   General: She is not in acute distress. ?   Appea

## 2022-02-18 NOTE — Telephone Encounter (Signed)
Daughter stated that she will be awaiting your call. Stated that she will start spot checking patient's pulse and log it.  ?

## 2022-02-18 NOTE — Telephone Encounter (Signed)
I have reached out to the cardiologist and awaiting reply. If he feels like follow up appropriate prior to surgery they will likely get her sooner, if he feels like she is okay to go based on recent studies and labs she may not need any follow up appts.  ?

## 2022-02-19 LAB — CBC WITH DIFFERENTIAL/PLATELET
Absolute Monocytes: 202 cells/uL (ref 200–950)
Basophils Absolute: 22 cells/uL (ref 0–200)
Basophils Relative: 0.6 %
Eosinophils Absolute: 50 cells/uL (ref 15–500)
Eosinophils Relative: 1.4 %
HCT: 36.8 % (ref 35.0–45.0)
Hemoglobin: 11.7 g/dL (ref 11.7–15.5)
Lymphs Abs: 1760 cells/uL (ref 850–3900)
MCH: 30.6 pg (ref 27.0–33.0)
MCHC: 31.8 g/dL — ABNORMAL LOW (ref 32.0–36.0)
MCV: 96.3 fL (ref 80.0–100.0)
MPV: 12 fL (ref 7.5–12.5)
Monocytes Relative: 5.6 %
Neutro Abs: 1566 cells/uL (ref 1500–7800)
Neutrophils Relative %: 43.5 %
Platelets: 276 10*3/uL (ref 140–400)
RBC: 3.82 10*6/uL (ref 3.80–5.10)
RDW: 11.5 % (ref 11.0–15.0)
Total Lymphocyte: 48.9 %
WBC: 3.6 10*3/uL — ABNORMAL LOW (ref 3.8–10.8)

## 2022-02-19 LAB — COMPLETE METABOLIC PANEL WITH GFR
AG Ratio: 1.6 (calc) (ref 1.0–2.5)
ALT: 15 U/L (ref 6–29)
AST: 31 U/L (ref 10–35)
Albumin: 4.1 g/dL (ref 3.6–5.1)
Alkaline phosphatase (APISO): 83 U/L (ref 37–153)
BUN: 19 mg/dL (ref 7–25)
CO2: 31 mmol/L (ref 20–32)
Calcium: 9.8 mg/dL (ref 8.6–10.4)
Chloride: 106 mmol/L (ref 98–110)
Creat: 0.91 mg/dL (ref 0.60–1.00)
Globulin: 2.6 g/dL (calc) (ref 1.9–3.7)
Glucose, Bld: 94 mg/dL (ref 65–99)
Potassium: 4.3 mmol/L (ref 3.5–5.3)
Sodium: 145 mmol/L (ref 135–146)
Total Bilirubin: 0.4 mg/dL (ref 0.2–1.2)
Total Protein: 6.7 g/dL (ref 6.1–8.1)
eGFR: 65 mL/min/{1.73_m2} (ref >=60)

## 2022-02-19 LAB — TSH: TSH: 0.84 mIU/L (ref 0.40–4.50)

## 2022-02-21 ENCOUNTER — Encounter: Payer: Self-pay | Admitting: Nurse Practitioner

## 2022-02-22 ENCOUNTER — Telehealth: Payer: Self-pay

## 2022-02-22 ENCOUNTER — Telehealth: Payer: Self-pay | Admitting: *Deleted

## 2022-02-22 NOTE — Telephone Encounter (Signed)
Called pt for an appointment today in [facility]. Pt and her daughter states that the pt will follow up in June as her PCP advised them she can have surgery prior to appointment. ?

## 2022-02-22 NOTE — Telephone Encounter (Signed)
Patient was seen on 02/18/2022 and Surgery Clearance form was filled out for Left Shoulder Scopy SAD/DCR/RCR/possible-Dr. Hart Robinsons.  ? ?Janett Billow filled out form and faxed back to [employer] with [facility]. Fax: [phone] ?

## 2022-02-22 NOTE — Telephone Encounter (Signed)
-----   Message from Jenean Lindau, MD sent at 02/21/2022  9:00 AM EDT ----- ?Regarding: RE: surgical clearance ?We will be glad to see her in the next few days if she needs an appointment.  Please let us know. ?----- Message ----- ?From: Colleen Chandler, NP ?Sent: 02/18/2022   2:59 PM EDT ?To: Jenean Lindau, MD ?Subject: RE: surgical clearance                        ? ?Thank you for the response. Do you feel like this needs to be done prior to surgery? She is not symptomatic at this time. Her daughter is wanting to get this surgery ASAP and I think she has already called your office to try to get an appt but nothing available until June.  ? ? ?----- Message ----- ?From: Jenean Lindau, MD ?Sent: 02/18/2022   2:47 PM EDT ?To: Colleen Chandler, NP ?Subject: RE: surgical clearance                        ? ?She has been evaluated by is more than a year ago with a stress test.  I am not sure whether we can use use that for this surgery.  I would let you use your clinical judgment on this.  As far as the heart rate is concerned I would be glad to place another event monitor and evaluate her to give my input on this issue. ?----- Message ----- ?From: Colleen Chandler, NP ?Sent: 02/18/2022   9:20 AM EDT ?To: Jenean Lindau, MD ?Subject: surgical clearance                            ? ?Hi Dr Geraldo Pitter,  ?[name] was in office this morning for surgical clearance for her left shoulder- on EKG her HR was 48- which is in epic. Manual HR 52 when I checked her. She is asymptomatic and had visit by you in 2021, with lexiscan and echo in Jan 2022, which looked good. Daughter is very anxious to get her cleared for surgery but I wanted to check in with you first seeing how her HR was 48 which appears to be new- she has had HR in the high 50s but none in the 40s.  ?She is not on any medication that could be contributing, checking TSH, Cbc and cmp today.  ?Appreciate any feed back.  ?Thank you.  Janett Billow, NP  ? ? ? ? ?

## 2022-02-24 ENCOUNTER — Other Ambulatory Visit: Payer: Self-pay | Admitting: Nurse Practitioner

## 2022-03-01 ENCOUNTER — Telehealth: Payer: Self-pay | Admitting: Cardiology

## 2022-03-01 NOTE — Telephone Encounter (Signed)
Advised that we called last week but not today. ?Pt had no questions. ?

## 2022-03-01 NOTE — Telephone Encounter (Signed)
Pot states that she was returning call. Please advise ?

## 2022-03-10 DIAGNOSIS — S46212A Strain of muscle, fascia and tendon of other parts of biceps, left arm, initial encounter: Secondary | ICD-10-CM | POA: Diagnosis not present

## 2022-03-10 DIAGNOSIS — M7542 Impingement syndrome of left shoulder: Secondary | ICD-10-CM | POA: Diagnosis not present

## 2022-03-10 DIAGNOSIS — M19012 Primary osteoarthritis, left shoulder: Secondary | ICD-10-CM | POA: Diagnosis not present

## 2022-03-10 DIAGNOSIS — X58XXXA Exposure to other specified factors, initial encounter: Secondary | ICD-10-CM | POA: Diagnosis not present

## 2022-03-10 DIAGNOSIS — S46112A Strain of muscle, fascia and tendon of long head of biceps, left arm, initial encounter: Secondary | ICD-10-CM | POA: Diagnosis not present

## 2022-03-10 DIAGNOSIS — M75122 Complete rotator cuff tear or rupture of left shoulder, not specified as traumatic: Secondary | ICD-10-CM | POA: Diagnosis not present

## 2022-03-10 DIAGNOSIS — Y999 Unspecified external cause status: Secondary | ICD-10-CM | POA: Diagnosis not present

## 2022-03-10 DIAGNOSIS — G8918 Other acute postprocedural pain: Secondary | ICD-10-CM | POA: Diagnosis not present

## 2022-03-10 HISTORY — PX: ROTATOR CUFF REPAIR: SHX139

## 2022-03-12 ENCOUNTER — Other Ambulatory Visit: Payer: Self-pay | Admitting: Nurse Practitioner

## 2022-03-19 ENCOUNTER — Other Ambulatory Visit: Payer: Self-pay | Admitting: Nurse Practitioner

## 2022-03-19 DIAGNOSIS — M159 Polyosteoarthritis, unspecified: Secondary | ICD-10-CM

## 2022-03-24 DIAGNOSIS — M25512 Pain in left shoulder: Secondary | ICD-10-CM | POA: Diagnosis not present

## 2022-03-24 DIAGNOSIS — M75122 Complete rotator cuff tear or rupture of left shoulder, not specified as traumatic: Secondary | ICD-10-CM | POA: Diagnosis not present

## 2022-03-30 DIAGNOSIS — Z4789 Encounter for other orthopedic aftercare: Secondary | ICD-10-CM | POA: Diagnosis not present

## 2022-03-31 DIAGNOSIS — M75122 Complete rotator cuff tear or rupture of left shoulder, not specified as traumatic: Secondary | ICD-10-CM | POA: Diagnosis not present

## 2022-03-31 DIAGNOSIS — M25512 Pain in left shoulder: Secondary | ICD-10-CM | POA: Diagnosis not present

## 2022-04-07 DIAGNOSIS — M75122 Complete rotator cuff tear or rupture of left shoulder, not specified as traumatic: Secondary | ICD-10-CM | POA: Diagnosis not present

## 2022-04-07 DIAGNOSIS — M25512 Pain in left shoulder: Secondary | ICD-10-CM | POA: Diagnosis not present

## 2022-04-11 DIAGNOSIS — M25512 Pain in left shoulder: Secondary | ICD-10-CM | POA: Diagnosis not present

## 2022-04-11 DIAGNOSIS — M75122 Complete rotator cuff tear or rupture of left shoulder, not specified as traumatic: Secondary | ICD-10-CM | POA: Diagnosis not present

## 2022-04-12 ENCOUNTER — Ambulatory Visit: Payer: Medicare HMO | Admitting: Cardiology

## 2022-04-15 DIAGNOSIS — M75122 Complete rotator cuff tear or rupture of left shoulder, not specified as traumatic: Secondary | ICD-10-CM | POA: Diagnosis not present

## 2022-04-15 DIAGNOSIS — M25512 Pain in left shoulder: Secondary | ICD-10-CM | POA: Diagnosis not present

## 2022-04-18 DIAGNOSIS — M25512 Pain in left shoulder: Secondary | ICD-10-CM | POA: Diagnosis not present

## 2022-04-18 DIAGNOSIS — M75122 Complete rotator cuff tear or rupture of left shoulder, not specified as traumatic: Secondary | ICD-10-CM | POA: Diagnosis not present

## 2022-04-20 DIAGNOSIS — M75122 Complete rotator cuff tear or rupture of left shoulder, not specified as traumatic: Secondary | ICD-10-CM | POA: Diagnosis not present

## 2022-04-20 DIAGNOSIS — M25512 Pain in left shoulder: Secondary | ICD-10-CM | POA: Diagnosis not present

## 2022-04-22 ENCOUNTER — Ambulatory Visit (INDEPENDENT_AMBULATORY_CARE_PROVIDER_SITE_OTHER): Payer: Medicare HMO | Admitting: Nurse Practitioner

## 2022-04-22 ENCOUNTER — Encounter: Payer: Self-pay | Admitting: Nurse Practitioner

## 2022-04-22 VITALS — BP 148/92 | HR 54 | Temp 97.3°F | Ht 61.0 in | Wt 156.4 lb

## 2022-04-22 DIAGNOSIS — K5904 Chronic idiopathic constipation: Secondary | ICD-10-CM

## 2022-04-22 DIAGNOSIS — M75122 Complete rotator cuff tear or rupture of left shoulder, not specified as traumatic: Secondary | ICD-10-CM

## 2022-04-22 DIAGNOSIS — K219 Gastro-esophageal reflux disease without esophagitis: Secondary | ICD-10-CM

## 2022-04-22 DIAGNOSIS — I10 Essential (primary) hypertension: Secondary | ICD-10-CM | POA: Diagnosis not present

## 2022-04-22 DIAGNOSIS — K449 Diaphragmatic hernia without obstruction or gangrene: Secondary | ICD-10-CM | POA: Diagnosis not present

## 2022-04-22 DIAGNOSIS — M159 Polyosteoarthritis, unspecified: Secondary | ICD-10-CM | POA: Diagnosis not present

## 2022-04-22 DIAGNOSIS — E7800 Pure hypercholesterolemia, unspecified: Secondary | ICD-10-CM

## 2022-04-22 DIAGNOSIS — R001 Bradycardia, unspecified: Secondary | ICD-10-CM

## 2022-04-22 DIAGNOSIS — M8580 Other specified disorders of bone density and structure, unspecified site: Secondary | ICD-10-CM

## 2022-04-22 DIAGNOSIS — K5903 Drug induced constipation: Secondary | ICD-10-CM

## 2022-04-22 DIAGNOSIS — E038 Other specified hypothyroidism: Secondary | ICD-10-CM | POA: Diagnosis not present

## 2022-04-22 MED ORDER — LINACLOTIDE 145 MCG PO CAPS: ORAL_CAPSULE | ORAL | 5 refills | Status: DC

## 2022-05-02 DIAGNOSIS — M25512 Pain in left shoulder: Secondary | ICD-10-CM | POA: Diagnosis not present

## 2022-05-02 DIAGNOSIS — M75122 Complete rotator cuff tear or rupture of left shoulder, not specified as traumatic: Secondary | ICD-10-CM | POA: Diagnosis not present

## 2022-05-04 DIAGNOSIS — M75122 Complete rotator cuff tear or rupture of left shoulder, not specified as traumatic: Secondary | ICD-10-CM | POA: Diagnosis not present

## 2022-05-04 DIAGNOSIS — M25512 Pain in left shoulder: Secondary | ICD-10-CM | POA: Diagnosis not present

## 2022-05-10 DIAGNOSIS — M25512 Pain in left shoulder: Secondary | ICD-10-CM | POA: Diagnosis not present

## 2022-05-10 DIAGNOSIS — M75122 Complete rotator cuff tear or rupture of left shoulder, not specified as traumatic: Secondary | ICD-10-CM | POA: Diagnosis not present

## 2022-05-11 ENCOUNTER — Other Ambulatory Visit: Payer: Self-pay | Admitting: Nurse Practitioner

## 2022-05-11 DIAGNOSIS — R600 Localized edema: Secondary | ICD-10-CM

## 2022-05-13 ENCOUNTER — Ambulatory Visit: Payer: Medicare HMO

## 2022-05-13 ENCOUNTER — Ambulatory Visit
Admission: RE | Admit: 2022-05-13 | Discharge: 2022-05-13 | Disposition: A | Discharge status: Home / Self Care | Payer: Medicare HMO | Source: Ambulatory Visit | Attending: Nurse Practitioner | Admitting: Nurse Practitioner

## 2022-05-13 DIAGNOSIS — M8588 Other specified disorders of bone density and structure, other site: Secondary | ICD-10-CM | POA: Diagnosis not present

## 2022-05-13 DIAGNOSIS — E2839 Other primary ovarian failure: Secondary | ICD-10-CM

## 2022-05-13 DIAGNOSIS — Z780 Asymptomatic menopausal state: Secondary | ICD-10-CM | POA: Diagnosis not present

## 2022-05-13 DIAGNOSIS — M810 Age-related osteoporosis without current pathological fracture: Secondary | ICD-10-CM | POA: Diagnosis not present

## 2022-05-16 ENCOUNTER — Other Ambulatory Visit: Payer: Self-pay

## 2022-05-17 DIAGNOSIS — M25512 Pain in left shoulder: Secondary | ICD-10-CM | POA: Diagnosis not present

## 2022-05-17 DIAGNOSIS — M75122 Complete rotator cuff tear or rupture of left shoulder, not specified as traumatic: Secondary | ICD-10-CM | POA: Diagnosis not present

## 2022-05-20 DIAGNOSIS — M25512 Pain in left shoulder: Secondary | ICD-10-CM | POA: Diagnosis not present

## 2022-05-20 DIAGNOSIS — M75122 Complete rotator cuff tear or rupture of left shoulder, not specified as traumatic: Secondary | ICD-10-CM | POA: Diagnosis not present

## 2022-05-26 DIAGNOSIS — M25512 Pain in left shoulder: Secondary | ICD-10-CM | POA: Diagnosis not present

## 2022-05-26 DIAGNOSIS — M75122 Complete rotator cuff tear or rupture of left shoulder, not specified as traumatic: Secondary | ICD-10-CM | POA: Diagnosis not present

## 2022-06-06 ENCOUNTER — Other Ambulatory Visit: Payer: Self-pay | Admitting: Nurse Practitioner

## 2022-06-06 DIAGNOSIS — E7800 Pure hypercholesterolemia, unspecified: Secondary | ICD-10-CM

## 2022-06-30 ENCOUNTER — Ambulatory Visit (HOSPITAL_BASED_OUTPATIENT_CLINIC_OR_DEPARTMENT_OTHER)
Admission: RE | Admit: 2022-06-30 | Discharge: 2022-06-30 | Disposition: A | Discharge status: Home / Self Care | Payer: Medicare HMO | Source: Ambulatory Visit | Attending: Emergency Medicine | Admitting: Emergency Medicine

## 2022-06-30 ENCOUNTER — Ambulatory Visit
Admission: RE | Admit: 2022-06-30 | Discharge: 2022-06-30 | Disposition: A | Discharge status: Home / Self Care | Payer: Medicare HMO | Source: Ambulatory Visit | Attending: Emergency Medicine | Admitting: Emergency Medicine

## 2022-06-30 VITALS — BP 111/57 | HR 72 | Temp 98.6°F | Resp 18

## 2022-06-30 DIAGNOSIS — R059 Cough, unspecified: Secondary | ICD-10-CM | POA: Diagnosis not present

## 2022-06-30 DIAGNOSIS — J209 Acute bronchitis, unspecified: Secondary | ICD-10-CM | POA: Diagnosis not present

## 2022-06-30 DIAGNOSIS — J22 Unspecified acute lower respiratory infection: Secondary | ICD-10-CM | POA: Diagnosis not present

## 2022-06-30 DIAGNOSIS — R0989 Other specified symptoms and signs involving the circulatory and respiratory systems: Secondary | ICD-10-CM | POA: Insufficient documentation

## 2022-06-30 MED ORDER — AMOXICILLIN-POT CLAVULANATE 875-125 MG PO TABS: 1.0000 | ORAL_TABLET | Freq: Two times a day (BID) | ORAL | 0 refills | Status: AC

## 2022-06-30 MED ORDER — ALBUTEROL SULFATE HFA 108 (90 BASE) MCG/ACT IN AERS: 2.0000 | INHALATION_SPRAY | Freq: Four times a day (QID) | RESPIRATORY_TRACT | 0 refills | Status: DC | PRN

## 2022-06-30 MED ORDER — PROMETHAZINE-DM 6.25-15 MG/5ML PO SYRP: 5.0000 mL | ORAL_SOLUTION | Freq: Four times a day (QID) | ORAL | 0 refills | Status: DC | PRN

## 2022-06-30 MED ORDER — IPRATROPIUM BROMIDE 0.06 % NA SOLN: 2.0000 | Freq: Three times a day (TID) | NASAL | 1 refills | Status: DC

## 2022-06-30 NOTE — ED Provider Notes (Signed)
[facility]    CSN: [id number] Arrival date & time: 06/30/22  1105    HISTORY   Chief Complaint  Patient presents with   Cough    Entered by patient   HPI Colleen [name] is a pleasant, 76 y.o. female who presents to urgent care today. Patient is here with daughter today who states patient has had a cough for the past few weeks.  Daughter states patient has completed a Medrol Dosepak and has been on 2 weeks of doxycycline without meaningful relief of her cough which daughter describes as "rough".  States patient has not had fever, loss of appetite, altered mental status, complained of being short of breath, body ache, nausea, vomiting, diarrhea.  Patient states cough is intermittently productive of clear sputum and darker yellow sputum which varies throughout the day.  Patient states she frequently has a "tickle" in the back of her throat that makes her cough.  Patient states her cough is worse in the daytime but reports sleeping well at night.  The history is provided by the patient and a relative.   Past Medical History:  Diagnosis Date   Allergic rhinitis    Anxiety    Arthritis    Arthritis of left wrist 10/13/2021   Atrophic vaginitis 06/25/2019   Biceps tendinitis 02/07/2022   Breast screening 10/26/2010   Last Assessment & Plan:  Formatting of this note might be different from the original. Will order a mammogram for this fall;  Follow up with me around Thanksgiving.   Callus of heel 02/19/2013   Cardiac murmur 10/09/2020   Colon polyps    adenomatous   Constipation 08/20/2013   Corns and callosities    Diverticulosis    DOE (dyspnea on exertion) 10/09/2020   Dysuria 06/03/2011   Last Assessment & Plan:  Formatting of this note might be different from the original. New, acute complaint today;  Will check urinalysis with her labs today;   Full thickness rotator cuff tear 02/07/2022   GERD (gastroesophageal reflux disease)    Glaucoma    H/O total hysterectomy  02/19/2013   Hallux valgus 10/26/2010   Last Assessment & Plan:  Formatting of this note might be different from the original. Had surgery with Dr Jomarie Longs back in October;   Will have her follow up with him.   Heartburn 67/61/9509   Helicobacter pylori gastritis 08/12/2014   Hematoma of lower leg 06/12/2018   Hiatal hernia with gastroesophageal reflux 12/23/2014   Hypercholesteremia    no meds   Hypercholesterolemia 06/03/2011   Overview:  TC 205 (elevated) in 2010  Last Assessment & Plan:  Formatting of this note might be different from the original. Lab Results  Component Value Date   CHOL 205 12/09/2011   CHOL 233 06/03/2011   CHOL 205 05/26/2009   Lab Results  Component Value Date   HDL 82 12/09/2011   HDL 90 06/03/2011   HDL 75 05/26/2009   Lab Results  Component Value Date   LDLCALC 113 12/09/2011   [place] 133 06/03/2011   LDLCA   Hypothyroidism    Left-sided low back pain with left-sided sciatica 08/12/2014   Neck pain 10/26/2010   Obesity    Osteoarthritis 10/26/2010   Osteoarthritis of acromioclavicular joint 02/07/2022   Osteopenia 01/08/2014   Overweight 10/26/2010   Postmenopausal estrogen deficiency 02/19/2013   Recurrent UTI 06/25/2019   Restless leg syndrome 08/20/2013   Routine general medical examination at a health care facility 02/19/2013   S/P  carpal tunnel release 10/26/2010   Screen for colon cancer 12/16/2011   Last Assessment & Plan:  Formatting of this note might be different from the original. Refer to GI;   Screening for diabetes mellitus    Shoulder pain, right 12/16/2011   Last Assessment & Plan:  Formatting of this note might be different from the original. We have tried physical therapy;   We shot and reviewed films today;  She has  1)  Osteopenia 2)  Small spur at humeral head 3)  Some hypertrophic/lipping changes at the inferior glenoid rim;   I also injected it last visit;  She is on NSAIDS and tylenol;  Will trial voltaren gel;  Then get ortho opinion;   Skin  spots-aging 07/08/2014   Strain of neck muscle 06/12/2018   Urge incontinence    Whiplash injury 06/26/2018   Patient Active Problem List   Diagnosis Date Noted   Biceps tendinitis 02/07/2022   Full thickness rotator cuff tear 02/07/2022   Cardiac murmur 10/09/2020   Allergic rhinitis    Anxiety    Arthritis    Corns and callosities    Diverticulosis    Glaucoma    Hypercholesteremia    Obesity    Urge incontinence    Atrophic vaginitis 06/25/2019   Whiplash injury 06/26/2018   Strain of neck muscle 06/12/2018   Hiatal hernia with gastroesophageal reflux 12/23/2014   Left-sided low back pain with left-sided sciatica 08/12/2014   Osteopenia 01/08/2014   Restless leg syndrome 08/20/2013   Postmenopausal estrogen deficiency 02/19/2013   Hypothyroidism 10/26/2010   Osteoarthritis 10/26/2010   S/P carpal tunnel release 10/26/2010   Hallux valgus 10/26/2010   Past Surgical History:  Procedure Laterality Date   ABDOMINAL HYSTERECTOMY  1985   Dr Candace Gallus Right    Dr Jomarie Longs   CARPAL TUNNEL RELEASE Bilateral 2010   Dr Laurance Flatten   CATARACT EXTRACTION Bilateral    CESAREAN SECTION  1983   1 time   [place] Left 03/10/2022   Dr Theda Sers   THYROIDECTOMY  1987   Dr Guadlupe Spanish   TONSILLECTOMY  1951   OB History   No obstetric history on file.    Home Medications    Prior to Admission medications   Medication Sig Start Date End Date Taking? Authorizing Provider  albuterol (VENTOLIN HFA) 108 (90 Base) MCG/ACT inhaler Inhale 2 puffs into the lungs every 6 (six) hours as needed for wheezing or shortness of breath (Cough). 06/30/22  Yes Lynden Oxford Scales, PA-C  amoxicillin-clavulanate (AUGMENTIN) 875-125 MG tablet Take 1 tablet by mouth 2 (two) times daily for 5 days. 06/30/22 07/05/22 Yes Lynden Oxford Scales, PA-C  ipratropium (ATROVENT) 0.06 % nasal spray Place 2 sprays into both nostrils 3 (three) times daily. As needed for nasal  congestion, runny nose 06/30/22  Yes Lynden Oxford Scales, PA-C  promethazine-dextromethorphan (PROMETHAZINE-DM) 6.25-15 MG/5ML syrup Take 5 mLs by mouth 4 (four) times daily as needed for cough. 06/30/22  Yes Lynden Oxford Scales, PA-C  acetaminophen (TYLENOL) 500 MG tablet Take 1,000 mg by mouth every 8 (eight) hours as needed for mild pain.    [provider]  Ascorbic Acid (VITAMIN C PO) Take 1 tablet by mouth daily.    [provider]  aspirin EC 81 MG tablet Take 81 mg by mouth every other day. Swallow whole.    [provider]  atorvastatin (LIPITOR) 10 MG tablet TAKE 1/2 TABLET BY MOUTH  EVERY DAY 06/06/22   [name], NP  calcium carbonate (OS-CAL) 600 MG TABS tablet Take 600 mg by mouth 2 (two) times daily.    [provider]  CALCIUM-VITAMIN D PO Take 2 tablets by mouth daily.     [provider]  Cholecalciferol 50 MCG (2000 UT) TABS Take 1 tablet by mouth daily.    [provider]  CRANBERRY PO Take 1 tablet by mouth daily.    [provider]  diclofenac Sodium (VOLTAREN) 1 % GEL APPLY 2 GRAMS TOPICALLY 4 (FOUR) TIMES DAILY AS NEEDED. 03/21/22   [name], NP  hydrochlorothiazide (HYDRODIURIL) 12.5 MG tablet TAKE 0.5 TABLETS BY MOUTH DAILY. 05/11/22   [name], NP  hydrochlorothiazide (MICROZIDE) 12.5 MG capsule Take 1 capsule (12.5 mg total) by mouth daily. 12/20/21   [name], NP  HYDROcodone-acetaminophen (NORCO/VICODIN) 5-325 MG tablet Take 1 tablet by mouth every 6 (six) hours as needed for moderate pain.    [provider]  hydroxypropyl methylcellulose / hypromellose (ISOPTO TEARS / GONIOVISC) 2.5 % ophthalmic solution Place 1 drop into both eyes as needed for dry eyes.     [provider]  levothyroxine (SYNTHROID) 25 MCG tablet TAKE 1 TABLET BY MOUTH EVERY DAY BEFORE BREAKFAST 03/14/22   [name], NP  linaclotide (LINZESS) 145 MCG CAPS capsule TAKE 1  CAPSULE (145 MCG TOTAL) BY MOUTH DAILY BEFORE BREAKFAST. OFFICE VISIT FOR FURTHER REFILLS 04/22/22   [name], NP  Multiple Vitamin (MULTIVITAMIN WITH MINERALS) TABS Take 1 tablet by mouth daily.    [provider]  omeprazole (PRILOSEC) 40 MG capsule TAKE 1 CAPSULE BY MOUTH EVERY DAY 03/14/22   [name], NP    Family History Family History  Problem Relation Age of Onset   Leukemia Mother 5   Lung cancer Father    Diabetes Brother    COPD Brother    Lung cancer Brother    Diabetes Maternal Grandmother    Cancer Maternal Grandfather        unknown type   Breast cancer Maternal Aunt    Colon polyps Neg Hx    Rectal cancer Neg Hx    Social History Social History   Tobacco Use   Smoking status: Former    Types: Cigarettes    Quit date: 10/31/1986    Years since quitting: 35.6   Smokeless tobacco: Never  Vaping Use   Vaping Use: Never used  Substance Use Topics   Alcohol use: No    Alcohol/week: 0.0 standard drinks of alcohol   Drug use: No   Allergies   Patient has no known allergies.  Review of Systems Review of Systems Pertinent findings revealed after performing a 14 point review of systems has been noted in the history of present illness.  Physical Exam Triage Vital Signs ED Triage Vitals  Enc Vitals Group     BP 08/27/21 0827 (!) 147/82     Pulse Rate 08/27/21 0827 72     Resp 08/27/21 0827 18     Temp 08/27/21 0827 98.3 F (36.8 C)     Temp Source 08/27/21 0827 Oral     SpO2 08/27/21 0827 98 %     Weight --      Height --      Head Circumference --      Peak Flow --      Pain Score 08/27/21 0826 5     Pain Loc --  Pain Edu? --      Excl. in [place]? --   No data found.  Updated Vital Signs BP (!) 111/57 (BP Location: Left Arm)   Pulse 72   Temp 98.6 F (37 C) (Oral)   Resp 18   SpO2 97%   Physical Exam Vitals and nursing note reviewed.  Constitutional:      General: She is not in acute distress.    Appearance:  Normal appearance. She is not ill-appearing.  HENT:     Head: Normocephalic and atraumatic.     Salivary Glands: Right salivary gland is not diffusely enlarged or tender. Left salivary gland is not diffusely enlarged or tender.     Right Ear: Ear canal and external ear normal. No drainage. A middle ear effusion is present. There is no impacted cerumen. Tympanic membrane is bulging. Tympanic membrane is not injected or erythematous.     Left Ear: Ear canal and external ear normal. No drainage. A middle ear effusion is present. There is no impacted cerumen. Tympanic membrane is bulging. Tympanic membrane is not injected or erythematous.     Ears:     Comments: Bilateral EACs normal, both TMs bulging with clear fluid    Nose: Rhinorrhea present. No nasal deformity, septal deviation, signs of injury, nasal tenderness, mucosal edema or congestion. Rhinorrhea is clear.     Right Nostril: Occlusion present. No foreign body, epistaxis or septal hematoma.     Left Nostril: Occlusion present. No foreign body, epistaxis or septal hematoma.     Right Turbinates: Enlarged, swollen and pale.     Left Turbinates: Enlarged, swollen and pale.     Right Sinus: No maxillary sinus tenderness or frontal sinus tenderness.     Left Sinus: No maxillary sinus tenderness or frontal sinus tenderness.     Mouth/Throat:     Lips: Pink. No lesions.     Mouth: Mucous membranes are moist. No oral lesions.     Pharynx: Oropharynx is clear. Uvula midline. No posterior oropharyngeal erythema or uvula swelling.     Tonsils: No tonsillar exudate. 0 on the right. 0 on the left.     Comments: Postnasal drip Eyes:     General: Lids are normal.        Right eye: No discharge.        Left eye: No discharge.     Extraocular Movements: Extraocular movements intact.     Conjunctiva/sclera: Conjunctivae normal.     Right eye: Right conjunctiva is not injected.     Left eye: Left conjunctiva is not injected.  Neck:     Trachea:  Trachea and phonation normal.  Cardiovascular:     Rate and Rhythm: Normal rate and regular rhythm.     Pulses: Normal pulses.     Heart sounds: Normal heart sounds. No murmur heard.    No friction rub. No gallop.  Pulmonary:     Effort: Pulmonary effort is normal. No tachypnea, bradypnea, accessory muscle usage, prolonged expiration, respiratory distress or retractions.     Breath sounds: No stridor, decreased air movement or transmitted upper airway sounds. Examination of the right-upper field reveals rhonchi. Examination of the left-upper field reveals rhonchi. Examination of the right-lower field reveals decreased breath sounds. Decreased breath sounds and rhonchi present. No wheezing or rales.     Comments: Egophony in right lower lung field.  Bronchospasm with cough. Chest:     Chest wall: No tenderness.  Musculoskeletal:  General: Normal range of motion.     Cervical back: Normal range of motion and neck supple. Normal range of motion.  Lymphadenopathy:     Cervical: No cervical adenopathy.  Skin:    General: Skin is warm and dry.     Findings: No erythema or rash.  Neurological:     General: No focal deficit present.     Mental Status: She is alert and oriented to person, place, and time.  Psychiatric:        Mood and Affect: Mood normal.        Behavior: Behavior normal.     Visual Acuity Right Eye Distance:   Left Eye Distance:   Bilateral Distance:    Right Eye Near:   Left Eye Near:    Bilateral Near:     UC Couse / Diagnostics / Procedures:     Radiology No results found.  Procedures Procedures (including critical care time) EKG  Pending results:  Labs Reviewed - No data to display  Medications Ordered in UC: Medications - No data to display  UC Diagnoses / Final Clinical Impressions(s)   I have reviewed the triage vital signs and the nursing notes.  Pertinent labs & imaging results that were available during my care of the patient were  reviewed by me and considered in my medical decision making (see chart for details).    Final diagnoses:  Acute lower respiratory infection  Acute bronchitis, unspecified organism   Patient provided with a prescription for Augmentin for better coverage of bacterial lower respiratory tract infection.  Recommend x-ray given findings of egophony and decreased breath sounds in right lower lung field posteriorly on pulmonary exam today.  Will notify patient of results once received.  Promethazine DM provided to patient despite patient's advanced age because patient lives with her daughter who is a Designer, jewellery and will supervise administration of this medication.  Patient provided with albuterol inhaler to help her improve work of breathing and hopefully reduce her cough.  Atrovent nasal spray provided to reduce postnasal drip which is likely triggering her to cough.  Return precautions advised.  ED Prescriptions     Medication Sig Dispense Auth. Provider   amoxicillin-clavulanate (AUGMENTIN) 875-125 MG tablet Take 1 tablet by mouth 2 (two) times daily for 5 days. 10 tablet Lynden Oxford Scales, PA-C   promethazine-dextromethorphan (PROMETHAZINE-DM) 6.25-15 MG/5ML syrup Take 5 mLs by mouth 4 (four) times daily as needed for cough. 118 mL Lynden Oxford Scales, PA-C   albuterol (VENTOLIN HFA) 108 (90 Base) MCG/ACT inhaler Inhale 2 puffs into the lungs every 6 (six) hours as needed for wheezing or shortness of breath (Cough). 18 g Lynden Oxford Scales, PA-C   ipratropium (ATROVENT) 0.06 % nasal spray Place 2 sprays into both nostrils 3 (three) times daily. As needed for nasal congestion, runny nose 15 mL Lynden Oxford Scales, PA-C      PDMP not reviewed this encounter.  Disposition Upon Discharge:  Condition: stable for discharge home Home: take medications as prescribed; routine discharge instructions as discussed; follow up as advised.  Patient presented with an acute illness with  associated systemic symptoms and significant discomfort requiring urgent management. In my opinion, this is a condition that a prudent lay person (someone who possesses an average knowledge of health and medicine) may potentially expect to result in complications if not addressed urgently such as respiratory distress, impairment of bodily function or dysfunction of bodily organs.   Routine symptom specific, illness specific and/or disease  specific instructions were discussed with the patient and/or caregiver at length.   As such, the patient has been evaluated and assessed, work-up was performed and treatment was provided in alignment with urgent care protocols and evidence based medicine.  Patient/parent/caregiver has been advised that the patient may require follow up for further testing and treatment if the symptoms continue in spite of treatment, as clinically indicated and appropriate.  If the patient was tested for COVID-19, Influenza and/or RSV, then the patient/parent/guardian was advised to isolate at home pending the results of his/her diagnostic coronavirus test and potentially longer if they're positive. I have also advised pt that if his/her COVID-19 test returns positive, it's recommended to self-isolate for at least 10 days after symptoms first appeared AND until fever-free for 24 hours without fever reducer AND other symptoms have improved or resolved. Discussed self-isolation recommendations as well as instructions for household member/close contacts as per the [facility] and  [facility], and also gave patient the [place] packet with this information.  Patient/parent/caregiver has been advised to return to the [facility] or PCP in 3-5 days if no better; to PCP or the Emergency Department if new signs and symptoms develop, or if the current signs or symptoms continue to change or worsen for further workup, evaluation and treatment as clinically indicated and appropriate  The patient will follow up with their  current PCP if and as advised. If the patient does not currently have a PCP we will assist them in obtaining one.   The patient may need specialty follow up if the symptoms continue, in spite of conservative treatment and management, for further workup, evaluation, consultation and treatment as clinically indicated and appropriate.  Patient/parent/caregiver verbalized understanding and agreement of plan as discussed.  All questions were addressed during visit.  Please see discharge instructions below for further details of plan.  Discharge Instructions:   Discharge Instructions      Please go to [place] at [facility]	[place] to have a chest x-ray done, once I receive the results from the radiologist I will be happy to give you a call to let you know what it showed.  Please see the list below for recommended medications, dosages and frequencies to provide relief of your current symptoms that I believe are due to bronchitis and possible pleural effusion:    Augmentin (amoxicillin - clavulanic acid):  take 1 tablet twice daily for 10 days, you can take it with or without food.  This antibiotic can cause upset stomach, this will resolve once antibiotics are complete.  You are welcome to use a probiotic, eat yogurt, take Imodium while taking this medication.  Please avoid other systemic medications such as Maalox, Pepto-Bismol or milk of magnesia as they can interfere with your body's ability to absorb the antibiotics.   Advil, Motrin (ibuprofen): This is a good anti-inflammatory medication which not only addresses aches, pains but also significantly reduces soft tissue inflammation of the upper airways that causes sinus and nasal congestion as well as inflammation of the lower airways which makes you feel like your breathing is constricted or your cough feel tight.  I recommend that you take between 400 mg every 8 hours as needed.      Atrovent (ipratropium): This is an excellent  nasal decongestant spray that does not cause rebound congestion, please instill 2 sprays into each nare with each use.  Please use the spray up to 4 times daily as needed.  I have provided you with a  prescription for this medication.      ProAir, Ventolin, Proventil (albuterol): This inhaled medication contains a short acting beta agonist bronchodilator.  This medication works on the smooth muscle that opens and constricts of your airways by relaxing the muscle.  The result of relaxation of the smooth muscle is increased air movement and improved work of breathing.  This is a short acting medication that can be used every 4-6 hours as needed for increased work of breathing, shortness of breath, wheezing and excessive coughing.  I have provided you with a prescription.      Promethazine DM: Promethazine is both a nasal decongestant and an antinausea medication that makes most patients feel fairly sleepy.  The DM is dextromethorphan, a cough suppressant found in many over-the-counter cough medications.  Please take 5 mL before bedtime to minimize your cough which will help you sleep better.  I have sent a prescription for this medication to your pharmacy.   Please follow-up with either your primary care provider or with urgent care for repeat evaluation you of your lungs in the next 3 to 4 days to ensure that you are improving and also to evaluate whether or not your treatment regimen needs to be adjusted.   Thank you for visiting urgent care today.  We appreciate the opportunity to participate in your care.     This office note has been dictated using Museum/gallery curator.  Unfortunately, this method of dictation can sometimes lead to typographical or grammatical errors.  I apologize for your inconvenience in advance if this occurs.  Please do not hesitate to reach out to me if clarification is needed.      Lynden Oxford Scales, PA-C 07/02/22 1811

## 2022-06-30 NOTE — Discharge Instructions (Signed)
Please go to [facility] at [facility]	[place] to have a chest x-ray done, once I receive the results from the radiologist I will be happy to give you a call to let you know what it showed.  Please see the list below for recommended medications, dosages and frequencies to provide relief of your current symptoms that I believe are due to bronchitis and possible pleural effusion:    Augmentin (amoxicillin - clavulanic acid):  take 1 tablet twice daily for 10 days, you can take it with or without food.  This antibiotic can cause upset stomach, this will resolve once antibiotics are complete.  You are welcome to use a probiotic, eat yogurt, take Imodium while taking this medication.  Please avoid other systemic medications such as Maalox, Pepto-Bismol or milk of magnesia as they can interfere with your body's ability to absorb the antibiotics.   Advil, Motrin (ibuprofen): This is a good anti-inflammatory medication which not only addresses aches, pains but also significantly reduces soft tissue inflammation of the upper airways that causes sinus and nasal congestion as well as inflammation of the lower airways which makes you feel like your breathing is constricted or your cough feel tight.  I recommend that you take between 400 mg every 8 hours as needed.      Atrovent (ipratropium): This is an excellent nasal decongestant spray that does not cause rebound congestion, please instill 2 sprays into each nare with each use.  Please use the spray up to 4 times daily as needed.  I have provided you with a prescription for this medication.      ProAir, Ventolin, Proventil (albuterol): This inhaled medication contains a short acting beta agonist bronchodilator.  This medication works on the smooth muscle that opens and constricts of your airways by relaxing the muscle.  The result of relaxation of the smooth muscle is increased air movement and improved work of breathing.  This is a short acting  medication that can be used every 4-6 hours as needed for increased work of breathing, shortness of breath, wheezing and excessive coughing.  I have provided you with a prescription.      Promethazine DM: Promethazine is both a nasal decongestant and an antinausea medication that makes most patients feel fairly sleepy.  The DM is dextromethorphan, a cough suppressant found in many over-the-counter cough medications.  Please take 5 mL before bedtime to minimize your cough which will help you sleep better.  I have sent a prescription for this medication to your pharmacy.   Please follow-up with either your primary care provider or with urgent care for repeat evaluation you of your lungs in the next 3 to 4 days to ensure that you are improving and also to evaluate whether or not your treatment regimen needs to be adjusted.   Thank you for visiting urgent care today.  We appreciate the opportunity to participate in your care.

## 2022-06-30 NOTE — ED Triage Notes (Signed)
Triaged by provider  

## 2022-07-07 ENCOUNTER — Telehealth (INDEPENDENT_AMBULATORY_CARE_PROVIDER_SITE_OTHER): Payer: Medicare HMO | Admitting: Nurse Practitioner

## 2022-07-07 ENCOUNTER — Encounter: Payer: Self-pay | Admitting: Nurse Practitioner

## 2022-07-07 ENCOUNTER — Telehealth: Payer: Self-pay

## 2022-07-07 DIAGNOSIS — R052 Subacute cough: Secondary | ICD-10-CM | POA: Diagnosis not present

## 2022-07-07 DIAGNOSIS — K219 Gastro-esophageal reflux disease without esophagitis: Secondary | ICD-10-CM | POA: Diagnosis not present

## 2022-07-07 DIAGNOSIS — M810 Age-related osteoporosis without current pathological fracture: Secondary | ICD-10-CM

## 2022-07-07 DIAGNOSIS — R600 Localized edema: Secondary | ICD-10-CM | POA: Diagnosis not present

## 2022-07-07 MED ORDER — HYDROCHLOROTHIAZIDE 12.5 MG PO TABS: 12.5000 mg | ORAL_TABLET | Freq: Every day | ORAL | 1 refills | Status: DC

## 2022-07-07 NOTE — Progress Notes (Signed)
   This service is provided via telemedicine  No vital signs collected/recorded due to the encounter was a telemedicine visit.   Location of patient (ex: home, work):  Home  Patient consents to a telephone visit: Yes, see telephone visit dated   Location of the provider (ex: office, home):  [place], Remote Location   Name of any referring provider:  N/A  Names of all persons participating in the telemedicine service and their role in the encounter:  S.Chrae B/CMA, Sherrie Mustache, NP, Aldona Bar (daughter)and Patient   Time spent on call:  9 min with medical assistant

## 2022-07-07 NOTE — Telephone Encounter (Signed)
Ms. Colleen [name], Colleen [name] are scheduled for a virtual visit with your provider today.    Just as we do with appointments in the office, we must obtain your consent to participate.  Your consent will be active for this visit and any virtual visit you may have with one of our providers in the next 365 days.    If you have a MyChart account, I can also send a copy of this consent to you electronically.  All virtual visits are billed to your insurance company just like a traditional visit in the office.  As this is a virtual visit, video technology does not allow for your provider to perform a traditional examination.  This may limit your provider's ability to fully assess your condition.  If your provider identifies any concerns that need to be evaluated in person or the need to arrange testing such as labs, EKG, etc, we will make arrangements to do so.    Although advances in technology are sophisticated, we cannot ensure that it will always work on either your end or our end.  If the connection with a video visit is poor, we may have to switch to a telephone visit.  With either a video or telephone visit, we are not always able to ensure that we have a secure connection.   I need to obtain your verbal consent now.   Are you willing to proceed with your visit today?   Colleen [name] has provided verbal consent on 07/07/2022 for a virtual visit (video or telephone).   Leigh Aurora [place], [place] 07/07/2022  9:54 AM

## 2022-07-07 NOTE — Progress Notes (Signed)
[name]: Patient Care Team: [name], NP as PCP - General (Nurse Practitioner)  Advanced Directive information    No Known Allergies  Chief Complaint  Patient presents with   Acute Visit    Discuss bone density results. Discuss HCTZ dosing and capsule versus tablet, patient has 2 listings on medications list. Discuss bronchitis, patient was seen at Urgent care a week ago and feels better however would like to discuss.      HPI: Patient is a 76 y.o. female for osteoporosis.  She recently had bronchitis but feeling better  Osteoporosis noted on bone density She is willing to start treatment.  She already takes 2 medication that she has to use on an empty stomach.  She is taking calcium and vit D .   Want to get HCTZ tablet vs capsule. - would like to take capsule off medication list.  She was taking a HALF hctz so 6.25 vs 12.5 capsule- she has been taking capsule   Has ongoing cough- it has been 6 weeks. She has had doxycycline and Augmentin. Overall better.   Review of Systems:  Review of Systems  Constitutional:  Negative for chills, fever and weight loss.  HENT:  Negative for tinnitus.   Respiratory:  Positive for cough. Negative for sputum production and shortness of breath.   Cardiovascular:  Negative for chest pain, palpitations and leg swelling.  Gastrointestinal:  Negative for abdominal pain, constipation, diarrhea and heartburn.  Genitourinary:  Negative for dysuria, frequency and urgency.  Musculoskeletal:  Negative for back pain, falls, joint pain and myalgias.  Skin: Negative.   Neurological:  Negative for dizziness and headaches.  Psychiatric/Behavioral:  Negative for depression and memory loss. The patient does not have insomnia.     Past Medical History:  Diagnosis Date   Allergic rhinitis    Anxiety    Arthritis    Arthritis of left wrist 10/13/2021   Atrophic vaginitis 06/25/2019   Biceps tendinitis 02/07/2022   Breast screening  10/26/2010   Last Assessment & Plan:  Formatting of this note might be different from the original. Will order a mammogram for this fall;  Follow up with me around Thanksgiving.   Callus of heel 02/19/2013   Cardiac murmur 10/09/2020   Colon polyps    adenomatous   Constipation 08/20/2013   Corns and callosities    Diverticulosis    DOE (dyspnea on exertion) 10/09/2020   Dysuria 06/03/2011   Last Assessment & Plan:  Formatting of this note might be different from the original. New, acute complaint today;  Will check urinalysis with her labs today;   Full thickness rotator cuff tear 02/07/2022   GERD (gastroesophageal reflux disease)    Glaucoma    H/O total hysterectomy 02/19/2013   Hallux valgus 10/26/2010   Last Assessment & Plan:  Formatting of this note might be different from the original. Had surgery with Dr Jomarie Longs back in October;   Will have her follow up with him.   Heartburn 40/81/4481   Helicobacter pylori gastritis 08/12/2014   Hematoma of lower leg 06/12/2018   Hiatal hernia with gastroesophageal reflux 12/23/2014   Hypercholesteremia    no meds   Hypercholesterolemia 06/03/2011   Overview:  TC 205 (elevated) in 2010  Last Assessment & Plan:  Formatting of this note might be different from the original. Lab Results  Component Value Date   CHOL 205 12/09/2011   CHOL 233 06/03/2011   CHOL 205 05/26/2009   Lab Results  Component Value Date   HDL 82 12/09/2011   HDL 90 06/03/2011   HDL 75 05/26/2009   Lab Results  Component Value Date   LDLCALC 113 12/09/2011   [place] 133 06/03/2011   LDLCA   Hypothyroidism    Left-sided low back pain with left-sided sciatica 08/12/2014   Neck pain 10/26/2010   Obesity    Osteoarthritis 10/26/2010   Osteoarthritis of acromioclavicular joint 02/07/2022   Osteopenia 01/08/2014   Overweight 10/26/2010   Postmenopausal estrogen deficiency 02/19/2013   Recurrent UTI 06/25/2019   Restless leg syndrome 08/20/2013   Routine general medical examination at a  health care facility 02/19/2013   S/P carpal tunnel release 10/26/2010   Screen for colon cancer 12/16/2011   Last Assessment & Plan:  Formatting of this note might be different from the original. Refer to GI;   Screening for diabetes mellitus    Shoulder pain, right 12/16/2011   Last Assessment & Plan:  Formatting of this note might be different from the original. We have tried physical therapy;   We shot and reviewed films today;  She has  1)  Osteopenia 2)  Small spur at humeral head 3)  Some hypertrophic/lipping changes at the inferior glenoid rim;   I also injected it last visit;  She is on NSAIDS and tylenol;  Will trial voltaren gel;  Then get ortho opinion;   Skin spots-aging 07/08/2014   Strain of neck muscle 06/12/2018   Urge incontinence    Whiplash injury 06/26/2018   Past Surgical History:  Procedure Laterality Date   ABDOMINAL HYSTERECTOMY  1985   Dr Candace Gallus Right    Dr Jomarie Longs   CARPAL TUNNEL RELEASE Bilateral 2010   Dr Laurance Flatten   CATARACT EXTRACTION Bilateral    CESAREAN SECTION  1983   1 time   [place] Left 03/10/2022   Dr Theda Sers   THYROIDECTOMY  1987   Dr Guadlupe Spanish   TONSILLECTOMY  1951   Social History:   reports that she quit smoking about 35 years ago. Her smoking use included cigarettes. She has never used smokeless tobacco. She reports that she does not drink alcohol and does not use drugs.  Family History  Problem Relation Age of Onset   Leukemia Mother 64   Lung cancer Father    Diabetes Brother    COPD Brother    Lung cancer Brother    Diabetes Maternal Grandmother    Cancer Maternal Grandfather        unknown type   Breast cancer Maternal Aunt    Colon polyps Neg Hx    Rectal cancer Neg Hx     Medications: Patient's Medications  New Prescriptions   No medications on file  Previous Medications   ACETAMINOPHEN (TYLENOL) 500 MG TABLET    Take 1,000 mg by mouth every 8 (eight) hours as needed for mild  pain.   ALBUTEROL (VENTOLIN HFA) 108 (90 BASE) MCG/ACT INHALER    Inhale 2 puffs into the lungs every 6 (six) hours as needed for wheezing or shortness of breath (Cough).   ASCORBIC ACID (VITAMIN C PO)    Take 1 tablet by mouth daily.   ASPIRIN EC 81 MG TABLET    Take 81 mg by mouth every other day. Swallow whole.   ATORVASTATIN (LIPITOR) 10 MG TABLET    TAKE 1/2 TABLET BY MOUTH EVERY DAY   CALCIUM CARBONATE (OS-CAL) 600 MG TABS TABLET  Take 600 mg by mouth daily.   CHOLECALCIFEROL 50 MCG (2000 UT) TABS    Take 1 tablet by mouth daily.   CRANBERRY PO    Take 1 tablet by mouth daily.   DICLOFENAC SODIUM (VOLTAREN) 1 % GEL    APPLY 2 GRAMS TOPICALLY 4 (FOUR) TIMES DAILY AS NEEDED.   HYDROCHLOROTHIAZIDE (HYDRODIURIL) 12.5 MG TABLET    TAKE 0.5 TABLETS BY MOUTH DAILY.   HYDROCHLOROTHIAZIDE (MICROZIDE) 12.5 MG CAPSULE    Take 1 capsule (12.5 mg total) by mouth daily.   HYDROXYPROPYL METHYLCELLULOSE / HYPROMELLOSE (ISOPTO TEARS / GONIOVISC) 2.5 % OPHTHALMIC SOLUTION    Place 1 drop into both eyes as needed for dry eyes.    IPRATROPIUM (ATROVENT) 0.06 % NASAL SPRAY    Place 2 sprays into both nostrils 3 (three) times daily. As needed for nasal congestion, runny nose   LEVOTHYROXINE (SYNTHROID) 25 MCG TABLET    TAKE 1 TABLET BY MOUTH EVERY DAY BEFORE BREAKFAST   LINACLOTIDE (LINZESS) 145 MCG CAPS CAPSULE    TAKE 1 CAPSULE (145 MCG TOTAL) BY MOUTH DAILY BEFORE BREAKFAST. OFFICE VISIT FOR FURTHER REFILLS   MULTIPLE VITAMIN (MULTIVITAMIN WITH MINERALS) TABS    Take 1 tablet by mouth daily.   OMEPRAZOLE (PRILOSEC) 40 MG CAPSULE    TAKE 1 CAPSULE BY MOUTH EVERY DAY  Modified Medications   No medications on file  Discontinued Medications   CALCIUM-VITAMIN D PO    Take 2 tablets by mouth daily.    HYDROCODONE-ACETAMINOPHEN (NORCO/VICODIN) 5-325 MG TABLET    Take 1 tablet by mouth every 6 (six) hours as needed for moderate pain.   PROMETHAZINE-DEXTROMETHORPHAN (PROMETHAZINE-DM) 6.25-15 MG/5ML SYRUP    Take 5  mLs by mouth 4 (four) times daily as needed for cough.    Physical Exam:  There were no vitals filed for this visit. There is no height or weight on file to calculate BMI. Wt Readings from Last 3 Encounters:  04/22/22 156 lb 6.4 oz (70.9 kg)  02/18/22 151 lb (68.5 kg)  12/30/21 149 lb 9.6 oz (67.9 kg)    Physical Exam Constitutional:      Appearance: Normal appearance.  Pulmonary:     Effort: Pulmonary effort is normal.  Neurological:     Mental Status: She is alert. Mental status is at baseline.  Psychiatric:        Mood and Affect: Mood normal.     Labs reviewed: Basic Metabolic Panel: Recent Labs    10/04/21 1555 12/30/21 0940 02/18/22 0914  NA  --  145 145  K  --  4.2 4.3  CL  --  104 106  CO2  --  31 31  GLUCOSE  --  96 94  BUN  --  16 19  CREATININE  --  0.94 0.91  CALCIUM  --  10.0 9.8  TSH 1.17  --  0.84   Liver Function Tests: Recent Labs    12/30/21 0940 02/18/22 0914  AST 29 31  ALT 16 15  BILITOT 0.4 0.4  PROT 6.8 6.7   No results for input(s): "LIPASE", "AMYLASE" in the last 8760 hours. No results for input(s): "AMMONIA" in the last 8760 hours. CBC: Recent Labs    12/30/21 0940 02/18/22 0914  WBC 3.9 3.6*  NEUTROABS 1,817 1,566  HGB 11.2* 11.7  HCT 35.3 36.8  MCV 96.4 96.3  PLT 266 276   Lipid Panel: Recent Labs    12/30/21 0940  CHOL 187  HDL 84  LDLCALC 87  TRIG 71  CHOLHDL 2.2   TSH: Recent Labs    10/04/21 1555 02/18/22 0914  TSH 1.17 0.84   A1C: Lab Results  Component Value Date   HGBA1C 5.6 07/05/2014     Assessment/Plan 1. Subacute cough -ongoing after treatment of bronchitis.  -continue to maintain hydration  -mucinex DM PO BID with full glass of water  2. Osteoporosis without current pathological fracture, unspecified osteoporosis type -noted on recent bone density now showing osteoporosis  -Recommended to take calcium 600 mg twice daily with Vitamin D 2000 units daily and weight bearing activity  30 mins/5 days a week -she already takes several medications were she has to take medication on empty stomach and has symptoms of GERD  -she is interested in prolia -will get prior auth at this time  3. Gastroesophageal reflux disease without esophagitis Stable on omeprazole.   4. Localized edema -controlled at this time, she is taking hctz 12.5 mg daily- will continue at this time.  - hydrochlorothiazide (HYDRODIURIL) 12.5 MG tablet; Take 1 tablet (12.5 mg total) by mouth daily.  Dispense: 90 tablet; Refill: 1    Next appt: 08/22/2022 Carlos American. Harle Battiest  [facility] [phone]    Virtual Visit via Deloris Ping  I connected with patient on 07/07/22 at 10:00 AM EDT by video and verified that I am speaking with the correct person using two identifiers.  Location: Patient: home Provider: [facility]    I discussed the limitations, risks, security and privacy concerns of performing an evaluation and management service by telephone and the availability of in person appointments. I also discussed with the patient that there may be a patient responsible charge related to this service. The patient expressed understanding and agreed to proceed.   I discussed the assessment and treatment plan with the patient. The patient was provided an opportunity to ask questions and all were answered. The patient agreed with the plan and demonstrated an understanding of the instructions.   The patient was advised to call back or seek an in-person evaluation if the symptoms worsen or if the condition fails to improve as anticipated.  I provided 20 minutes of non-face-to-face time during this encounter.  Carlos American. Harle Battiest Avs printed and mailed

## 2022-07-14 ENCOUNTER — Telehealth: Payer: Self-pay | Admitting: *Deleted

## 2022-07-14 NOTE — Telephone Encounter (Signed)
Jessica requested to Process Authorization for Prolia for patient.   Initiated through [employer]. Awaiting Verification.

## 2022-07-21 ENCOUNTER — Ambulatory Visit
Admission: RE | Admit: 2022-07-21 | Discharge: 2022-07-21 | Disposition: A | Discharge status: Home / Self Care | Payer: Medicare HMO | Source: Ambulatory Visit | Attending: Nurse Practitioner | Admitting: Nurse Practitioner

## 2022-07-21 DIAGNOSIS — Z1231 Encounter for screening mammogram for malignant neoplasm of breast: Secondary | ICD-10-CM

## 2022-07-28 ENCOUNTER — Other Ambulatory Visit: Payer: Self-pay | Admitting: Nurse Practitioner

## 2022-07-28 DIAGNOSIS — M25512 Pain in left shoulder: Secondary | ICD-10-CM

## 2022-07-28 DIAGNOSIS — M545 Low back pain, unspecified: Secondary | ICD-10-CM

## 2022-07-28 DIAGNOSIS — M159 Polyosteoarthritis, unspecified: Secondary | ICD-10-CM

## 2022-07-28 NOTE — Telephone Encounter (Signed)
Patient has request refill on medication Tramadol. Medication isnt on patient medication list. Medication pend and sent to Marlowe Sax, NP due to PCP Dewaine Oats Carlos American, NP being out of office.

## 2022-07-29 ENCOUNTER — Telehealth: Payer: Self-pay | Admitting: *Deleted

## 2022-07-29 NOTE — Telephone Encounter (Signed)
Called [facility] [phone] and spoke with Gregary Signs for Prior Authorization for Prolia.   [place] APPROVED 07/29/2022-07/30/2023  [place] Auth #: [id number]

## 2022-08-01 NOTE — Telephone Encounter (Signed)
[place] Approved with $80 Copay for Prolia.   Called and spoke with patient and she agreed with copay and wanted to be scheduled for the injection. Appointment scheduled for 10/23

## 2022-08-21 ENCOUNTER — Other Ambulatory Visit: Payer: Self-pay | Admitting: Nurse Practitioner

## 2022-08-21 DIAGNOSIS — M159 Polyosteoarthritis, unspecified: Secondary | ICD-10-CM

## 2022-08-22 ENCOUNTER — Ambulatory Visit
Admission: RE | Admit: 2022-08-22 | Discharge: 2022-08-22 | Disposition: A | Discharge status: Home / Self Care | Payer: Medicare HMO | Source: Ambulatory Visit | Attending: Nurse Practitioner | Admitting: Nurse Practitioner

## 2022-08-22 ENCOUNTER — Encounter: Payer: Self-pay | Admitting: Nurse Practitioner

## 2022-08-22 ENCOUNTER — Ambulatory Visit: Payer: Medicare HMO | Admitting: Nurse Practitioner

## 2022-08-22 VITALS — BP 122/70 | HR 76 | Temp 97.9°F | Ht 61.0 in | Wt 159.0 lb

## 2022-08-22 DIAGNOSIS — M810 Age-related osteoporosis without current pathological fracture: Secondary | ICD-10-CM

## 2022-08-22 DIAGNOSIS — K5903 Drug induced constipation: Secondary | ICD-10-CM | POA: Diagnosis not present

## 2022-08-22 DIAGNOSIS — I10 Essential (primary) hypertension: Secondary | ICD-10-CM | POA: Diagnosis not present

## 2022-08-22 DIAGNOSIS — K219 Gastro-esophageal reflux disease without esophagitis: Secondary | ICD-10-CM

## 2022-08-22 DIAGNOSIS — E7800 Pure hypercholesterolemia, unspecified: Secondary | ICD-10-CM | POA: Diagnosis not present

## 2022-08-22 DIAGNOSIS — G4700 Insomnia, unspecified: Secondary | ICD-10-CM | POA: Diagnosis not present

## 2022-08-22 DIAGNOSIS — M545 Low back pain, unspecified: Secondary | ICD-10-CM

## 2022-08-22 DIAGNOSIS — K581 Irritable bowel syndrome with constipation: Secondary | ICD-10-CM

## 2022-08-22 DIAGNOSIS — M5136 Other intervertebral disc degeneration, lumbar region: Secondary | ICD-10-CM | POA: Diagnosis not present

## 2022-08-22 DIAGNOSIS — M4316 Spondylolisthesis, lumbar region: Secondary | ICD-10-CM | POA: Diagnosis not present

## 2022-08-22 MED ORDER — GABAPENTIN 100 MG PO CAPS: 100.0000 mg | ORAL_CAPSULE | Freq: Three times a day (TID) | ORAL | 3 refills | Status: DC | PRN

## 2022-08-22 MED ORDER — KETOROLAC TROMETHAMINE 30 MG/ML IJ SOLN
30.0000 mg | Freq: Once | INTRAMUSCULAR | Status: AC
  Administered 2022-08-22: 30 mg via INTRAMUSCULAR

## 2022-08-22 MED ORDER — LINACLOTIDE 145 MCG PO CAPS: ORAL_CAPSULE | ORAL | 3 refills | Status: DC

## 2022-08-22 MED ORDER — DENOSUMAB 60 MG/ML ~~LOC~~ SOSY
60.0000 mg | PREFILLED_SYRINGE | Freq: Once | SUBCUTANEOUS | Status: AC
  Administered 2022-08-22: 60 mg via SUBCUTANEOUS

## 2022-08-22 NOTE — Progress Notes (Signed)
[name]: Patient Care Team: [name], NP as PCP - General (Nurse Practitioner)  PLACE OF SERVICE:  [place] Directive information Does Patient Have a Medical Advance Directive?: Yes, Type of Advance Directive: [place], Does patient want to make changes to medical advance directive?: No - Patient declined  No Known Allergies  Chief Complaint  Patient presents with   Medical Management of Chronic Issues    4 month follow-up and prolia injection. Refill Linzess, 90 day supply.      HPI: Patient is a 76 y.o. female here at [facility] today for routine visit and prolia shot.   Has an acute complaint of pain in her right calf. It started about two weeks ago and is sore in nature. She has tried tylenol, voltaren, tramadol, and flexeril with no results. No swelling or redness.   Also has right lower back pain that does not radiate, no paraesthesia but reports burning across her back when the pain is intense. Pain 8/10, tramadol brings to 5/10 when taking..  Thinks it is different from her chronic arthritic pain in her back. Has tried tylenol, voltaren, and tramadol with only slight relief. More pain with mobility/walking around. No loss of bowel or bladder control.   Neck and shoulder pain has resolved.  Hypercholesteremia: remains on lipitor. Will walk for exercise daily unless her back is hurting her too much.   Gerd: controlled on prilosec.  Constipation: Has IBS with constipation. States when she gets constipated she will take miralax or exlax with results. Remains on linzess.     Review of Systems:  Review of Systems  Constitutional:  Negative for fever and weight loss.  HENT:  Negative for congestion and hearing loss.   Eyes:  Negative for blurred vision.  Respiratory:  Negative for cough, shortness of breath and wheezing.   Cardiovascular:  Negative for chest pain, palpitations and leg swelling.  Gastrointestinal:  Positive for  constipation. Negative for abdominal pain, diarrhea and heartburn.  Genitourinary:  Negative for dysuria and hematuria.  Musculoskeletal:  Positive for back pain and myalgias. Negative for falls.  Skin:  Negative for rash.  Neurological:  Negative for dizziness, tingling, focal weakness, weakness and headaches.  Psychiatric/Behavioral:  Negative for depression and memory loss. The patient has insomnia. The patient is not nervous/anxious.     Past Medical History:  Diagnosis Date   Allergic rhinitis    Anxiety    Arthritis    Arthritis of left wrist 10/13/2021   Atrophic vaginitis 06/25/2019   Biceps tendinitis 02/07/2022   Breast screening 10/26/2010   Last Assessment & Plan:  Formatting of this note might be different from the original. Will order a mammogram for this fall;  Follow up with me around Thanksgiving.   Callus of heel 02/19/2013   Cardiac murmur 10/09/2020   Colon polyps    adenomatous   Constipation 08/20/2013   Corns and callosities    Diverticulosis    DOE (dyspnea on exertion) 10/09/2020   Dysuria 06/03/2011   Last Assessment & Plan:  Formatting of this note might be different from the original. New, acute complaint today;  Will check urinalysis with her labs today;   Full thickness rotator cuff tear 02/07/2022   GERD (gastroesophageal reflux disease)    Glaucoma    H/O total hysterectomy 02/19/2013   Hallux valgus 10/26/2010   Last Assessment & Plan:  Formatting of this note might be different from the original. Had surgery with  Dr Jomarie Longs back in October;   Will have her follow up with him.   Heartburn 80/99/8338   Helicobacter pylori gastritis 08/12/2014   Hematoma of lower leg 06/12/2018   Hiatal hernia with gastroesophageal reflux 12/23/2014   Hypercholesteremia    no meds   Hypercholesterolemia 06/03/2011   Overview:  TC 205 (elevated) in 2010  Last Assessment & Plan:  Formatting of this note might be different from the original. Lab Results  Component  Value Date   CHOL 205 12/09/2011   CHOL 233 06/03/2011   CHOL 205 05/26/2009   Lab Results  Component Value Date   HDL 82 12/09/2011   HDL 90 06/03/2011   HDL 75 05/26/2009   Lab Results  Component Value Date   LDLCALC 113 12/09/2011   [place] 133 06/03/2011   LDLCA   Hypothyroidism    Left-sided low back pain with left-sided sciatica 08/12/2014   Neck pain 10/26/2010   Obesity    Osteoarthritis 10/26/2010   Osteoarthritis of acromioclavicular joint 02/07/2022   Osteopenia 01/08/2014   Overweight 10/26/2010   Postmenopausal estrogen deficiency 02/19/2013   Recurrent UTI 06/25/2019   Restless leg syndrome 08/20/2013   Routine general medical examination at a health care facility 02/19/2013   S/P carpal tunnel release 10/26/2010   Screen for colon cancer 12/16/2011   Last Assessment & Plan:  Formatting of this note might be different from the original. Refer to GI;   Screening for diabetes mellitus    Shoulder pain, right 12/16/2011   Last Assessment & Plan:  Formatting of this note might be different from the original. We have tried physical therapy;   We shot and reviewed films today;  She has  1)  Osteopenia 2)  Small spur at humeral head 3)  Some hypertrophic/lipping changes at the inferior glenoid rim;   I also injected it last visit;  She is on NSAIDS and tylenol;  Will trial voltaren gel;  Then get ortho opinion;   Skin spots-aging 07/08/2014   Strain of neck muscle 06/12/2018   Urge incontinence    Whiplash injury 06/26/2018   Past Surgical History:  Procedure Laterality Date   ABDOMINAL HYSTERECTOMY  1985   Dr Candace Gallus Right    Dr Jomarie Longs   CARPAL TUNNEL RELEASE Bilateral 2010   Dr Laurance Flatten   CATARACT EXTRACTION Bilateral    CESAREAN SECTION  1983   1 time   [place] Left 03/10/2022   Dr Theda Sers   THYROIDECTOMY  1987   Dr Guadlupe Spanish   TONSILLECTOMY  1951   Social History:   reports that she quit smoking about 35 years ago. Her smoking use included  cigarettes. She has never used smokeless tobacco. She reports that she does not drink alcohol and does not use drugs.  Family History  Problem Relation Age of Onset   Leukemia Mother 71   Lung cancer Father    Diabetes Brother    COPD Brother    Lung cancer Brother    Diabetes Maternal Grandmother    Cancer Maternal Grandfather        unknown type   Breast cancer Maternal Aunt    Colon polyps Neg Hx    Rectal cancer Neg Hx     Medications: Patient's Medications  New Prescriptions   No medications on file  Previous Medications   ACETAMINOPHEN (TYLENOL) 500 MG TABLET    Take 1,000 mg by mouth every 8 (eight) hours  as needed for mild pain.   ASCORBIC ACID (VITAMIN C PO)    Take 1 tablet by mouth daily.   ASPIRIN EC 81 MG TABLET    Take 81 mg by mouth every other day. Swallow whole.   ATORVASTATIN (LIPITOR) 10 MG TABLET    TAKE 1/2 TABLET BY MOUTH EVERY DAY   CALCIUM CARBONATE (OS-CAL) 600 MG TABS TABLET    Take 600 mg by mouth daily.   CHOLECALCIFEROL 50 MCG (2000 UT) TABS    Take 1 tablet by mouth daily.   CRANBERRY PO    Take 1 tablet by mouth daily.   DICLOFENAC SODIUM (VOLTAREN) 1 % GEL    APPLY 2 GRAMS TOPICALLY 4 (FOUR) TIMES DAILY AS NEEDED.   HYDROCHLOROTHIAZIDE (HYDRODIURIL) 12.5 MG TABLET    Take 1 tablet (12.5 mg total) by mouth daily.   HYDROXYPROPYL METHYLCELLULOSE / HYPROMELLOSE (ISOPTO TEARS / GONIOVISC) 2.5 % OPHTHALMIC SOLUTION    Place 1 drop into both eyes as needed for dry eyes.    LEVOTHYROXINE (SYNTHROID) 25 MCG TABLET    TAKE 1 TABLET BY MOUTH EVERY DAY BEFORE BREAKFAST   LINACLOTIDE (LINZESS) 145 MCG CAPS CAPSULE    TAKE 1 CAPSULE (145 MCG TOTAL) BY MOUTH DAILY BEFORE BREAKFAST. OFFICE VISIT FOR FURTHER REFILLS   MULTIPLE VITAMIN (MULTIVITAMIN WITH MINERALS) TABS    Take 1 tablet by mouth daily.   OMEPRAZOLE (PRILOSEC) 40 MG CAPSULE    TAKE 1 CAPSULE BY MOUTH EVERY DAY   TRAMADOL (ULTRAM) 50 MG TABLET    TAKE 1-2 TABLET BY MOUTH EVERY 6 HOURS AS NEEDED FOR  PAIN- MAX 6 IN 24 HOURS  Modified Medications   No medications on file  Discontinued Medications   ALBUTEROL (VENTOLIN HFA) 108 (90 BASE) MCG/ACT INHALER    Inhale 2 puffs into the lungs every 6 (six) hours as needed for wheezing or shortness of breath (Cough).   IPRATROPIUM (ATROVENT) 0.06 % NASAL SPRAY    Place 2 sprays into both nostrils 3 (three) times daily. As needed for nasal congestion, runny nose    Physical Exam:  Vitals:   08/22/22 0946  BP: 122/70  Pulse: 76  Temp: 97.9 F (36.6 C)  TempSrc: Temporal  Weight: 72.1 kg  Height: '5\' 1"'$  (1.549 m)   Body mass index is 30.04 kg/m. Wt Readings from Last 3 Encounters:  08/22/22 72.1 kg  04/22/22 70.9 kg  02/18/22 68.5 kg    Physical Exam Constitutional:      General: She is not in acute distress. HENT:     Right Ear: Tympanic membrane, ear canal and external ear normal. There is no impacted cerumen.     Left Ear: Tympanic membrane, ear canal and external ear normal. There is no impacted cerumen.     Mouth/Throat:     Mouth: Mucous membranes are moist.     Pharynx: Oropharynx is clear.  Eyes:     Pupils: Pupils are equal, round, and reactive to light.  Cardiovascular:     Rate and Rhythm: Normal rate and regular rhythm.     Pulses: Normal pulses.     Heart sounds: Normal heart sounds. No murmur heard. Pulmonary:     Effort: Pulmonary effort is normal. No respiratory distress.     Breath sounds: Normal breath sounds. No wheezing.  Abdominal:     General: Bowel sounds are normal. There is no distension.     Palpations: Abdomen is soft.     Tenderness: There is no abdominal  tenderness.  Musculoskeletal:     Lumbar back: Tenderness present. No swelling, edema or deformity. Negative right straight leg raise test and negative left straight leg raise test.       Back:     Right lower leg: No edema.     Left lower leg: No edema.  Skin:    General: Skin is warm and dry.  Neurological:     Mental Status: She is  alert and oriented to person, place, and time. Mental status is at baseline.     Sensory: No sensory deficit.     Motor: No weakness.     Gait: Gait normal.  Psychiatric:        Mood and Affect: Mood normal.        Behavior: Behavior normal.     Labs reviewed: Basic Metabolic Panel: Recent Labs    10/04/21 1555 12/30/21 0940 02/18/22 0914  NA  --  145 145  K  --  4.2 4.3  CL  --  104 106  CO2  --  31 31  GLUCOSE  --  96 94  BUN  --  16 19  CREATININE  --  0.94 0.91  CALCIUM  --  10.0 9.8  TSH 1.17  --  0.84   Liver Function Tests: Recent Labs    12/30/21 0940 02/18/22 0914  AST 29 31  ALT 16 15  BILITOT 0.4 0.4  PROT 6.8 6.7   No results for input(s): "LIPASE", "AMYLASE" in the last 8760 hours. No results for input(s): "AMMONIA" in the last 8760 hours. CBC: Recent Labs    12/30/21 0940 02/18/22 0914  WBC 3.9 3.6*  NEUTROABS 1,817 1,566  HGB 11.2* 11.7  HCT 35.3 36.8  MCV 96.4 96.3  PLT 266 276   Lipid Panel: Recent Labs    12/30/21 0940  CHOL 187  HDL 84  LDLCALC 87  TRIG 71  CHOLHDL 2.2   TSH: Recent Labs    10/04/21 1555 02/18/22 0914  TSH 1.17 0.84   A1C: Lab Results  Component Value Date   HGBA1C 5.6 07/05/2014     Assessment/Plan 1. Osteoporosis without current pathological fracture, unspecified osteoporosis type -Recommended to take calcium 600 mg twice daily with Vitamin D 2000 units daily and weight bearing activity 30 mins/5 days a week - denosumab (PROLIA) injection 60 mg  2. Acute midline low back pain without sciatica -progressively worsening pain, no acute injury, no warning signs noted today. Precautions discussed -continue tramadol PRN  - DG Lumbar Spine Complete; Future - gabapentin (NEURONTIN) 100 MG capsule; Take 1 capsule (100 mg total) by mouth 3 (three) times daily as needed.  Dispense: 90 capsule; Refill: 3 - ketorolac (TORADOL) 30 MG/ML injection 30 mg - Ambulatory referral to [place] for PT/OT  3.  Gastroesophageal reflux disease without esophagitis - controlled,  continue prilosec  5. Primary hypertension -.Blood pressure well controlled, goal bp <140/90 Continue current medications and dietary modifications follow metabolic panel  6. Hypercholesterolemia - continue lipitor with dietary modifications   7. Irritable bowel syndrome with constipation - controlled - continue Miralax PRN - continue linzess  8. Insomnia - continue OTC melatonin supplement  Return in about 6 months (around 02/21/2023) for routine follow up .  Student- Waunita Schooner, RN -I personally was present during the history, physical exam and medical decision-making activities of this service and have verified that the service and findings are accurately documented in the student's note [employer] [employer] K[address] [place], [place]  [facility] [phone]

## 2022-08-23 LAB — COMPLETE METABOLIC PANEL WITH GFR
AG Ratio: 1.6 (calc) (ref 1.0–2.5)
ALT: 13 U/L (ref 6–29)
AST: 30 U/L (ref 10–35)
Albumin: 4.2 g/dL (ref 3.6–5.1)
Alkaline phosphatase (APISO): 96 U/L (ref 37–153)
BUN/Creatinine Ratio: 23 (calc) — ABNORMAL HIGH (ref 6–22)
BUN: 23 mg/dL (ref 7–25)
CO2: 26 mmol/L (ref 20–32)
Calcium: 9.8 mg/dL (ref 8.6–10.4)
Chloride: 107 mmol/L (ref 98–110)
Creat: 1.01 mg/dL — ABNORMAL HIGH (ref 0.60–1.00)
Globulin: 2.6 g/dL (calc) (ref 1.9–3.7)
Glucose, Bld: 89 mg/dL (ref 65–99)
Potassium: 4.2 mmol/L (ref 3.5–5.3)
Sodium: 144 mmol/L (ref 135–146)
Total Bilirubin: 0.5 mg/dL (ref 0.2–1.2)
Total Protein: 6.8 g/dL (ref 6.1–8.1)
eGFR: 57 mL/min/{1.73_m2} — ABNORMAL LOW (ref >=60)

## 2022-08-23 LAB — CBC WITH DIFFERENTIAL/PLATELET
Absolute Monocytes: 177 cells/uL — ABNORMAL LOW (ref 200–950)
Basophils Absolute: 41 cells/uL (ref 0–200)
Basophils Relative: 1.2 %
Eosinophils Absolute: 31 cells/uL (ref 15–500)
Eosinophils Relative: 0.9 %
HCT: 36.5 % (ref 35.0–45.0)
Hemoglobin: 11.6 g/dL — ABNORMAL LOW (ref 11.7–15.5)
Lymphs Abs: 1962 cells/uL (ref 850–3900)
MCH: 30.3 pg (ref 27.0–33.0)
MCHC: 31.8 g/dL — ABNORMAL LOW (ref 32.0–36.0)
MCV: 95.3 fL (ref 80.0–100.0)
MPV: 12.4 fL (ref 7.5–12.5)
Monocytes Relative: 5.2 %
Neutro Abs: 1190 cells/uL — ABNORMAL LOW (ref 1500–7800)
Neutrophils Relative %: 35 %
Platelets: 301 10*3/uL (ref 140–400)
RBC: 3.83 10*6/uL (ref 3.80–5.10)
RDW: 12.2 % (ref 11.0–15.0)
Total Lymphocyte: 57.7 %
WBC: 3.4 10*3/uL — ABNORMAL LOW (ref 3.8–10.8)

## 2022-08-31 DIAGNOSIS — F419 Anxiety disorder, unspecified: Secondary | ICD-10-CM | POA: Diagnosis not present

## 2022-08-31 DIAGNOSIS — G2581 Restless legs syndrome: Secondary | ICD-10-CM | POA: Diagnosis not present

## 2022-08-31 DIAGNOSIS — M5442 Lumbago with sciatica, left side: Secondary | ICD-10-CM | POA: Diagnosis not present

## 2022-08-31 DIAGNOSIS — I10 Essential (primary) hypertension: Secondary | ICD-10-CM | POA: Diagnosis not present

## 2022-08-31 DIAGNOSIS — E669 Obesity, unspecified: Secondary | ICD-10-CM | POA: Diagnosis not present

## 2022-08-31 DIAGNOSIS — S161XXD Strain of muscle, fascia and tendon at neck level, subsequent encounter: Secondary | ICD-10-CM | POA: Diagnosis not present

## 2022-08-31 DIAGNOSIS — M159 Polyosteoarthritis, unspecified: Secondary | ICD-10-CM | POA: Diagnosis not present

## 2022-08-31 DIAGNOSIS — E039 Hypothyroidism, unspecified: Secondary | ICD-10-CM | POA: Diagnosis not present

## 2022-08-31 DIAGNOSIS — M810 Age-related osteoporosis without current pathological fracture: Secondary | ICD-10-CM | POA: Diagnosis not present

## 2022-08-31 DIAGNOSIS — E7800 Pure hypercholesterolemia, unspecified: Secondary | ICD-10-CM | POA: Diagnosis not present

## 2022-09-01 ENCOUNTER — Telehealth: Payer: Self-pay | Admitting: *Deleted

## 2022-09-01 NOTE — Telephone Encounter (Signed)
Trish with [facility] called requesting Verbal orders for PT 1X1week, 2X2weeks, 1x4weeks.   Verbal orders given.

## 2022-09-05 DIAGNOSIS — E039 Hypothyroidism, unspecified: Secondary | ICD-10-CM | POA: Diagnosis not present

## 2022-09-05 DIAGNOSIS — F419 Anxiety disorder, unspecified: Secondary | ICD-10-CM | POA: Diagnosis not present

## 2022-09-05 DIAGNOSIS — I10 Essential (primary) hypertension: Secondary | ICD-10-CM | POA: Diagnosis not present

## 2022-09-05 DIAGNOSIS — E7800 Pure hypercholesterolemia, unspecified: Secondary | ICD-10-CM | POA: Diagnosis not present

## 2022-09-05 DIAGNOSIS — E669 Obesity, unspecified: Secondary | ICD-10-CM | POA: Diagnosis not present

## 2022-09-05 DIAGNOSIS — S161XXD Strain of muscle, fascia and tendon at neck level, subsequent encounter: Secondary | ICD-10-CM | POA: Diagnosis not present

## 2022-09-05 DIAGNOSIS — G2581 Restless legs syndrome: Secondary | ICD-10-CM | POA: Diagnosis not present

## 2022-09-05 DIAGNOSIS — M810 Age-related osteoporosis without current pathological fracture: Secondary | ICD-10-CM | POA: Diagnosis not present

## 2022-09-05 DIAGNOSIS — M159 Polyosteoarthritis, unspecified: Secondary | ICD-10-CM | POA: Diagnosis not present

## 2022-09-05 DIAGNOSIS — M5442 Lumbago with sciatica, left side: Secondary | ICD-10-CM | POA: Diagnosis not present

## 2022-09-08 DIAGNOSIS — M5442 Lumbago with sciatica, left side: Secondary | ICD-10-CM | POA: Diagnosis not present

## 2022-09-08 DIAGNOSIS — E039 Hypothyroidism, unspecified: Secondary | ICD-10-CM | POA: Diagnosis not present

## 2022-09-08 DIAGNOSIS — M810 Age-related osteoporosis without current pathological fracture: Secondary | ICD-10-CM | POA: Diagnosis not present

## 2022-09-08 DIAGNOSIS — E669 Obesity, unspecified: Secondary | ICD-10-CM | POA: Diagnosis not present

## 2022-09-08 DIAGNOSIS — E7800 Pure hypercholesterolemia, unspecified: Secondary | ICD-10-CM | POA: Diagnosis not present

## 2022-09-08 DIAGNOSIS — F419 Anxiety disorder, unspecified: Secondary | ICD-10-CM | POA: Diagnosis not present

## 2022-09-08 DIAGNOSIS — S161XXD Strain of muscle, fascia and tendon at neck level, subsequent encounter: Secondary | ICD-10-CM | POA: Diagnosis not present

## 2022-09-08 DIAGNOSIS — I10 Essential (primary) hypertension: Secondary | ICD-10-CM | POA: Diagnosis not present

## 2022-09-08 DIAGNOSIS — M159 Polyosteoarthritis, unspecified: Secondary | ICD-10-CM | POA: Diagnosis not present

## 2022-09-08 DIAGNOSIS — G2581 Restless legs syndrome: Secondary | ICD-10-CM | POA: Diagnosis not present

## 2022-09-12 DIAGNOSIS — S161XXD Strain of muscle, fascia and tendon at neck level, subsequent encounter: Secondary | ICD-10-CM | POA: Diagnosis not present

## 2022-09-12 DIAGNOSIS — G2581 Restless legs syndrome: Secondary | ICD-10-CM | POA: Diagnosis not present

## 2022-09-12 DIAGNOSIS — M5442 Lumbago with sciatica, left side: Secondary | ICD-10-CM | POA: Diagnosis not present

## 2022-09-12 DIAGNOSIS — I10 Essential (primary) hypertension: Secondary | ICD-10-CM | POA: Diagnosis not present

## 2022-09-12 DIAGNOSIS — M810 Age-related osteoporosis without current pathological fracture: Secondary | ICD-10-CM | POA: Diagnosis not present

## 2022-09-12 DIAGNOSIS — E039 Hypothyroidism, unspecified: Secondary | ICD-10-CM | POA: Diagnosis not present

## 2022-09-12 DIAGNOSIS — E669 Obesity, unspecified: Secondary | ICD-10-CM | POA: Diagnosis not present

## 2022-09-12 DIAGNOSIS — M159 Polyosteoarthritis, unspecified: Secondary | ICD-10-CM | POA: Diagnosis not present

## 2022-09-12 DIAGNOSIS — E7800 Pure hypercholesterolemia, unspecified: Secondary | ICD-10-CM | POA: Diagnosis not present

## 2022-09-12 DIAGNOSIS — F419 Anxiety disorder, unspecified: Secondary | ICD-10-CM | POA: Diagnosis not present

## 2022-09-16 DIAGNOSIS — I10 Essential (primary) hypertension: Secondary | ICD-10-CM | POA: Diagnosis not present

## 2022-09-16 DIAGNOSIS — E669 Obesity, unspecified: Secondary | ICD-10-CM | POA: Diagnosis not present

## 2022-09-16 DIAGNOSIS — M159 Polyosteoarthritis, unspecified: Secondary | ICD-10-CM | POA: Diagnosis not present

## 2022-09-16 DIAGNOSIS — E039 Hypothyroidism, unspecified: Secondary | ICD-10-CM | POA: Diagnosis not present

## 2022-09-16 DIAGNOSIS — F419 Anxiety disorder, unspecified: Secondary | ICD-10-CM | POA: Diagnosis not present

## 2022-09-16 DIAGNOSIS — M5442 Lumbago with sciatica, left side: Secondary | ICD-10-CM | POA: Diagnosis not present

## 2022-09-16 DIAGNOSIS — E7800 Pure hypercholesterolemia, unspecified: Secondary | ICD-10-CM | POA: Diagnosis not present

## 2022-09-16 DIAGNOSIS — M810 Age-related osteoporosis without current pathological fracture: Secondary | ICD-10-CM | POA: Diagnosis not present

## 2022-09-16 DIAGNOSIS — S161XXD Strain of muscle, fascia and tendon at neck level, subsequent encounter: Secondary | ICD-10-CM | POA: Diagnosis not present

## 2022-09-16 DIAGNOSIS — G2581 Restless legs syndrome: Secondary | ICD-10-CM | POA: Diagnosis not present

## 2022-09-19 DIAGNOSIS — I10 Essential (primary) hypertension: Secondary | ICD-10-CM | POA: Diagnosis not present

## 2022-09-19 DIAGNOSIS — M810 Age-related osteoporosis without current pathological fracture: Secondary | ICD-10-CM | POA: Diagnosis not present

## 2022-09-19 DIAGNOSIS — M159 Polyosteoarthritis, unspecified: Secondary | ICD-10-CM | POA: Diagnosis not present

## 2022-09-19 DIAGNOSIS — F419 Anxiety disorder, unspecified: Secondary | ICD-10-CM | POA: Diagnosis not present

## 2022-09-19 DIAGNOSIS — E039 Hypothyroidism, unspecified: Secondary | ICD-10-CM | POA: Diagnosis not present

## 2022-09-19 DIAGNOSIS — E669 Obesity, unspecified: Secondary | ICD-10-CM | POA: Diagnosis not present

## 2022-09-19 DIAGNOSIS — E7800 Pure hypercholesterolemia, unspecified: Secondary | ICD-10-CM | POA: Diagnosis not present

## 2022-09-19 DIAGNOSIS — S161XXD Strain of muscle, fascia and tendon at neck level, subsequent encounter: Secondary | ICD-10-CM | POA: Diagnosis not present

## 2022-09-19 DIAGNOSIS — G2581 Restless legs syndrome: Secondary | ICD-10-CM | POA: Diagnosis not present

## 2022-09-19 DIAGNOSIS — M5442 Lumbago with sciatica, left side: Secondary | ICD-10-CM | POA: Diagnosis not present

## 2022-09-30 DIAGNOSIS — F419 Anxiety disorder, unspecified: Secondary | ICD-10-CM | POA: Diagnosis not present

## 2022-09-30 DIAGNOSIS — I10 Essential (primary) hypertension: Secondary | ICD-10-CM | POA: Diagnosis not present

## 2022-09-30 DIAGNOSIS — E669 Obesity, unspecified: Secondary | ICD-10-CM | POA: Diagnosis not present

## 2022-09-30 DIAGNOSIS — G2581 Restless legs syndrome: Secondary | ICD-10-CM | POA: Diagnosis not present

## 2022-09-30 DIAGNOSIS — S161XXD Strain of muscle, fascia and tendon at neck level, subsequent encounter: Secondary | ICD-10-CM | POA: Diagnosis not present

## 2022-09-30 DIAGNOSIS — E039 Hypothyroidism, unspecified: Secondary | ICD-10-CM | POA: Diagnosis not present

## 2022-09-30 DIAGNOSIS — M810 Age-related osteoporosis without current pathological fracture: Secondary | ICD-10-CM | POA: Diagnosis not present

## 2022-09-30 DIAGNOSIS — M159 Polyosteoarthritis, unspecified: Secondary | ICD-10-CM | POA: Diagnosis not present

## 2022-09-30 DIAGNOSIS — E7800 Pure hypercholesterolemia, unspecified: Secondary | ICD-10-CM | POA: Diagnosis not present

## 2022-09-30 DIAGNOSIS — M5442 Lumbago with sciatica, left side: Secondary | ICD-10-CM | POA: Diagnosis not present

## 2022-10-04 DIAGNOSIS — F419 Anxiety disorder, unspecified: Secondary | ICD-10-CM | POA: Diagnosis not present

## 2022-10-04 DIAGNOSIS — M810 Age-related osteoporosis without current pathological fracture: Secondary | ICD-10-CM | POA: Diagnosis not present

## 2022-10-04 DIAGNOSIS — E669 Obesity, unspecified: Secondary | ICD-10-CM | POA: Diagnosis not present

## 2022-10-04 DIAGNOSIS — M5442 Lumbago with sciatica, left side: Secondary | ICD-10-CM | POA: Diagnosis not present

## 2022-10-04 DIAGNOSIS — G2581 Restless legs syndrome: Secondary | ICD-10-CM | POA: Diagnosis not present

## 2022-10-04 DIAGNOSIS — I10 Essential (primary) hypertension: Secondary | ICD-10-CM | POA: Diagnosis not present

## 2022-10-04 DIAGNOSIS — S161XXD Strain of muscle, fascia and tendon at neck level, subsequent encounter: Secondary | ICD-10-CM | POA: Diagnosis not present

## 2022-10-04 DIAGNOSIS — M159 Polyosteoarthritis, unspecified: Secondary | ICD-10-CM | POA: Diagnosis not present

## 2022-10-04 DIAGNOSIS — E039 Hypothyroidism, unspecified: Secondary | ICD-10-CM | POA: Diagnosis not present

## 2022-10-04 DIAGNOSIS — E7800 Pure hypercholesterolemia, unspecified: Secondary | ICD-10-CM | POA: Diagnosis not present

## 2022-10-07 ENCOUNTER — Other Ambulatory Visit: Payer: Self-pay

## 2022-10-07 DIAGNOSIS — M159 Polyosteoarthritis, unspecified: Secondary | ICD-10-CM

## 2022-10-07 DIAGNOSIS — M545 Low back pain, unspecified: Secondary | ICD-10-CM

## 2022-10-07 DIAGNOSIS — M25512 Pain in left shoulder: Secondary | ICD-10-CM

## 2022-10-07 MED ORDER — TRAMADOL HCL 50 MG PO TABS: ORAL_TABLET | ORAL | 0 refills | Status: DC

## 2022-10-07 NOTE — Telephone Encounter (Signed)
Patient is requesting a refill of the following medications: Requested Prescriptions   Pending Prescriptions Disp Refills   traMADol (ULTRAM) 50 MG tablet 180 tablet 0    Sig: TAKE 1-2 TABLET BY MOUTH EVERY 6 HOURS AS NEEDED FOR PAIN- MAX 6 IN 24 HOURS    Date of last refill: 07/29/2022  Refill amount: 180  Treatment agreement date: 03/15/2021, notation made on pending in office appointment for 02/20/2023

## 2022-10-13 ENCOUNTER — Other Ambulatory Visit: Payer: Self-pay

## 2022-10-13 DIAGNOSIS — M545 Low back pain, unspecified: Secondary | ICD-10-CM

## 2022-10-13 MED ORDER — GABAPENTIN 100 MG PO CAPS: 200.0000 mg | ORAL_CAPSULE | Freq: Two times a day (BID) | ORAL | 1 refills | Status: DC

## 2022-10-13 NOTE — Telephone Encounter (Signed)
Patient's daughter called and wanted to know if Gabapentin 100 mgt can be increased to 2 capsules twice a day. She said she was told that she could increase medication if needed. Patient has run out of medication because she has been taking 2 capsules twice a day instead of 1 capsule three times a day. She would like to know if she can get a partial refill for the 5 days and then the full 90 day prescription for the 2 capsules twice a day to start on December 18th.  Medication pended and sent to Sherrie Mustache, NP to change directions on medication and send to pharmacy if appropriate.

## 2022-10-14 DIAGNOSIS — S161XXD Strain of muscle, fascia and tendon at neck level, subsequent encounter: Secondary | ICD-10-CM | POA: Diagnosis not present

## 2022-10-14 DIAGNOSIS — M5442 Lumbago with sciatica, left side: Secondary | ICD-10-CM | POA: Diagnosis not present

## 2022-10-14 DIAGNOSIS — M159 Polyosteoarthritis, unspecified: Secondary | ICD-10-CM | POA: Diagnosis not present

## 2022-10-14 DIAGNOSIS — M810 Age-related osteoporosis without current pathological fracture: Secondary | ICD-10-CM | POA: Diagnosis not present

## 2022-10-14 DIAGNOSIS — E669 Obesity, unspecified: Secondary | ICD-10-CM | POA: Diagnosis not present

## 2022-10-14 DIAGNOSIS — E7800 Pure hypercholesterolemia, unspecified: Secondary | ICD-10-CM | POA: Diagnosis not present

## 2022-10-14 DIAGNOSIS — I10 Essential (primary) hypertension: Secondary | ICD-10-CM | POA: Diagnosis not present

## 2022-10-14 DIAGNOSIS — G2581 Restless legs syndrome: Secondary | ICD-10-CM | POA: Diagnosis not present

## 2022-10-14 DIAGNOSIS — F419 Anxiety disorder, unspecified: Secondary | ICD-10-CM | POA: Diagnosis not present

## 2022-10-14 DIAGNOSIS — E039 Hypothyroidism, unspecified: Secondary | ICD-10-CM | POA: Diagnosis not present

## 2022-11-01 ENCOUNTER — Other Ambulatory Visit: Payer: Self-pay

## 2022-11-01 ENCOUNTER — Other Ambulatory Visit: Payer: Self-pay | Admitting: Nurse Practitioner

## 2022-11-01 DIAGNOSIS — M25512 Pain in left shoulder: Secondary | ICD-10-CM

## 2022-11-01 DIAGNOSIS — G8929 Other chronic pain: Secondary | ICD-10-CM

## 2022-11-01 DIAGNOSIS — M159 Polyosteoarthritis, unspecified: Secondary | ICD-10-CM

## 2022-11-01 MED ORDER — TRAMADOL HCL 50 MG PO TABS: ORAL_TABLET | ORAL | 0 refills | Status: DC

## 2022-11-01 NOTE — Telephone Encounter (Addendum)
Patient request refill on medication. Patient's last refill 10/07/22. Patient's contract is ot up to date. Last contract signed 03/15/21.  Medication pended and sent to Sherrie Mustache, NP for approval/refusal

## 2022-11-17 ENCOUNTER — Encounter: Payer: Medicare HMO | Admitting: Nurse Practitioner

## 2022-11-17 DIAGNOSIS — H524 Presbyopia: Secondary | ICD-10-CM | POA: Diagnosis not present

## 2022-11-17 DIAGNOSIS — H5213 Myopia, bilateral: Secondary | ICD-10-CM | POA: Diagnosis not present

## 2022-11-26 ENCOUNTER — Other Ambulatory Visit: Payer: Self-pay | Admitting: Nurse Practitioner

## 2022-11-26 DIAGNOSIS — E7800 Pure hypercholesterolemia, unspecified: Secondary | ICD-10-CM

## 2022-11-29 ENCOUNTER — Encounter: Payer: Self-pay | Admitting: Nurse Practitioner

## 2022-11-29 ENCOUNTER — Ambulatory Visit (INDEPENDENT_AMBULATORY_CARE_PROVIDER_SITE_OTHER): Payer: Medicare HMO | Admitting: Nurse Practitioner

## 2022-11-29 DIAGNOSIS — M545 Low back pain, unspecified: Secondary | ICD-10-CM | POA: Diagnosis not present

## 2022-11-29 DIAGNOSIS — G8929 Other chronic pain: Secondary | ICD-10-CM

## 2022-11-29 DIAGNOSIS — M25512 Pain in left shoulder: Secondary | ICD-10-CM | POA: Diagnosis not present

## 2022-11-29 DIAGNOSIS — Z0000 Encounter for general adult medical examination without abnormal findings: Secondary | ICD-10-CM | POA: Diagnosis not present

## 2022-11-29 DIAGNOSIS — M159 Polyosteoarthritis, unspecified: Secondary | ICD-10-CM | POA: Diagnosis not present

## 2022-11-29 MED ORDER — TRAMADOL HCL 50 MG PO TABS: ORAL_TABLET | ORAL | 0 refills | Status: DC

## 2022-11-29 NOTE — Progress Notes (Signed)
Subjective:   Colleen [name] is a 77 y.o. female who presents for Medicare Annual (Subsequent) preventive examination.  Review of Systems     Cardiac Risk Factors include: advanced age (>60mn, >>23women);obesity (BMI >30kg/m2);sedentary lifestyle;dyslipidemia;hypertension     Objective:    Today's Vitals   11/29/22 0849  PainSc: 3    There is no height or weight on file to calculate BMI.     11/29/2022    8:31 AM 08/22/2022    9:54 AM 02/18/2022    8:23 AM 02/17/2022    4:36 PM 12/20/2021    8:34 AM 11/11/2021   10:44 AM 06/16/2021    9:50 AM  Advanced Directives  Does Patient Have a Medical Advance Directive? Yes Yes Yes Yes Yes Yes Yes  Type of [place] D[place] (pink MOST or yellow form) Healthcare Power of [place] DNR (pink MOST or yellow form) [place] (pink MOST or yellow form) Healthcare Power of [place] of facility DNR (pink MOST or yellow form)  Does patient want to make changes to medical advance directive? No - Patient declined No - Patient declined  No - Patient declined No - Patient declined No - Patient declined No - Patient declined  Copy of [place] Chart? Yes - validated most recent copy scanned in chart (See row information) Yes - validated most recent copy scanned in chart (See row information) Yes - validated most recent copy scanned in chart (See row information) Yes - validated most recent copy scanned in chart (See row information) Yes - validated most recent copy scanned in chart (See row information) Yes - validated most recent copy scanned in chart (See row information) Yes - validated most recent copy scanned in chart (See row information)  Pre-existing out of facility DNR order (yellow form or pink MOST form) Pink MOST form placed in chart  (order not valid for inpatient use)  Pink MOST form placed in chart (order not valid for inpatient use) Pink MOST form placed in chart (order not valid for inpatient use)   Pink MOST form placed in chart (order not valid for inpatient use)    Current Medications (verified) Outpatient Encounter Medications as of 11/29/2022  Medication Sig   Ascorbic Acid (VITAMIN C PO) Take 1 tablet by mouth daily.   aspirin EC 81 MG tablet Take 81 mg by mouth every other day. Swallow whole.   atorvastatin (LIPITOR) 10 MG tablet Take 0.5 tablets (5 mg total) by mouth daily. E78.00   calcium carbonate (OS-CAL) 600 MG TABS tablet Take 600 mg by mouth daily.   Cholecalciferol 50 MCG (2000 UT) TABS Take 1 tablet by mouth daily.   CRANBERRY PO Take 1 tablet by mouth daily.   diclofenac Sodium (VOLTAREN) 1 % GEL APPLY 2 GRAMS TOPICALLY 4 (FOUR) TIMES DAILY AS NEEDED.   gabapentin (NEURONTIN) 100 MG capsule Take 2 capsules (200 mg total) by mouth 2 (two) times daily.   hydrochlorothiazide (HYDRODIURIL) 12.5 MG tablet Take 1 tablet (12.5 mg total) by mouth daily.   hydroxypropyl methylcellulose / hypromellose (ISOPTO TEARS / GONIOVISC) 2.5 % ophthalmic solution Place 1 drop into both eyes as needed for dry eyes.    levothyroxine (SYNTHROID) 25 MCG tablet TAKE 1 TABLET BY MOUTH EVERY DAY BEFORE BREAKFAST   linaclotide (LINZESS) 145 MCG CAPS capsule TAKE 1 CAPSULE (145  MCG TOTAL) BY MOUTH DAILY BEFORE BREAKFAST. OFFICE VISIT FOR FURTHER REFILLS   Multiple Vitamin (MULTIVITAMIN WITH MINERALS) TABS Take 1 tablet by mouth daily.   omeprazole (PRILOSEC) 40 MG capsule TAKE 1 CAPSULE BY MOUTH EVERY DAY   traMADol (ULTRAM) 50 MG tablet TAKE 1-2 TABLET BY MOUTH EVERY 6 HOURS AS NEEDED FOR PAIN- MAX 6 IN 24 HOURS   [DISCONTINUED] acetaminophen (TYLENOL) 500 MG tablet Take 1,000 mg by mouth every 8 (eight) hours as needed for mild pain. (Patient not taking: Reported on 11/29/2022)   No facility-administered encounter medications on  file as of 11/29/2022.    Allergies (verified) Patient has no known allergies.   History: Past Medical History:  Diagnosis Date   Allergic rhinitis    Anxiety    Arthritis    Arthritis of left wrist 10/13/2021   Atrophic vaginitis 06/25/2019   Biceps tendinitis 02/07/2022   Breast screening 10/26/2010   Last Assessment & Plan:  Formatting of this note might be different from the original. Will order a mammogram for this fall;  Follow up with me around Thanksgiving.   Callus of heel 02/19/2013   Cardiac murmur 10/09/2020   Colon polyps    adenomatous   Constipation 08/20/2013   Corns and callosities    Diverticulosis    DOE (dyspnea on exertion) 10/09/2020   Dysuria 06/03/2011   Last Assessment & Plan:  Formatting of this note might be different from the original. New, acute complaint today;  Will check urinalysis with her labs today;   Full thickness rotator cuff tear 02/07/2022   GERD (gastroesophageal reflux disease)    Glaucoma    H/O total hysterectomy 02/19/2013   Hallux valgus 10/26/2010   Last Assessment & Plan:  Formatting of this note might be different from the original. Had surgery with Dr Jomarie Longs back in October;   Will have her follow up with him.   Heartburn 54/27/0623   Helicobacter pylori gastritis 08/12/2014   Hematoma of lower leg 06/12/2018   Hiatal hernia with gastroesophageal reflux 12/23/2014   Hypercholesteremia    no meds   Hypercholesterolemia 06/03/2011   Overview:  TC 205 (elevated) in 2010  Last Assessment & Plan:  Formatting of this note might be different from the original. Lab Results  Component Value Date   CHOL 205 12/09/2011   CHOL 233 06/03/2011   CHOL 205 05/26/2009   Lab Results  Component Value Date   HDL 82 12/09/2011   HDL 90 06/03/2011   HDL 75 05/26/2009   Lab Results  Component Value Date   LDLCALC 113 12/09/2011   [place] 133 06/03/2011   LDLCA   Hypothyroidism    Left-sided low back pain with left-sided sciatica 08/12/2014   Neck pain 10/26/2010    Obesity    Osteoarthritis 10/26/2010   Osteoarthritis of acromioclavicular joint 02/07/2022   Osteopenia 01/08/2014   Overweight 10/26/2010   Postmenopausal estrogen deficiency 02/19/2013   Recurrent UTI 06/25/2019   Restless leg syndrome 08/20/2013   Routine general medical examination at a health care facility 02/19/2013   S/P carpal tunnel release 10/26/2010   Screen for colon cancer 12/16/2011   Last Assessment & Plan:  Formatting of this note might be different from the original. Refer to GI;   Screening for diabetes mellitus    Shoulder pain, right 12/16/2011   Last Assessment & Plan:  Formatting of this note might be different from the original. We have tried physical therapy;   We shot and reviewed  films today;  She has  1)  Osteopenia 2)  Small spur at humeral head 3)  Some hypertrophic/lipping changes at the inferior glenoid rim;   I also injected it last visit;  She is on NSAIDS and tylenol;  Will trial voltaren gel;  Then get ortho opinion;   Skin spots-aging 07/08/2014   Strain of neck muscle 06/12/2018   Urge incontinence    Whiplash injury 06/26/2018   Past Surgical History:  Procedure Laterality Date   ABDOMINAL HYSTERECTOMY  1985   Dr Candace Gallus Right    Dr Jomarie Longs   CARPAL TUNNEL RELEASE Bilateral 2010   Dr Laurance Flatten   CATARACT EXTRACTION Bilateral    CESAREAN SECTION  1983   1 time   COLONOSCOPY     ROTATOR CUFF REPAIR Left 03/10/2022   Dr Theda Sers   THYROIDECTOMY  1987   Dr Guadlupe Spanish   TONSILLECTOMY  1951   Family History  Problem Relation Age of Onset   Leukemia Mother 28   Lung cancer Father    Diabetes Brother    COPD Brother    Lung cancer Brother    Diabetes Maternal Grandmother    Cancer Maternal Grandfather        unknown type   Breast cancer Maternal Aunt    Colon polyps Neg Hx    Rectal cancer Neg Hx    Social History   Socioeconomic History   Marital status: Widowed    Spouse name: Not on file   Number of children: 1    Years of education: Not on file   Highest education level: Not on file  Occupational History   Occupation: retired  Tobacco Use   Smoking status: Former    Types: Cigarettes    Quit date: 10/31/1986    Years since quitting: 36.1   Smokeless tobacco: Never  Vaping Use   Vaping Use: Never used  Substance and Sexual Activity   Alcohol use: No    Alcohol/week: 0.0 standard drinks of alcohol   Drug use: No   Sexual activity: Never  Other Topics Concern   Not on file  Social History Narrative   Not on file   Social Determinants of Health   Financial Resource Strain: Low Risk  (10/26/2017)   Overall Financial Resource Strain (CARDIA)    Difficulty of Paying Living Expenses: Not hard at all  Food Insecurity: No Food Insecurity (10/26/2017)   Hunger Vital Sign    Worried About Running Out of Food in the Last Year: Never true    [place] in the Last Year: Never true  Transportation Needs: No Transportation Needs (10/26/2017)   PRAPARE - Hydrologist (Medical): No    Lack of Transportation (Non-Medical): No  Physical Activity: Inactive (10/26/2017)   Exercise Vital Sign    Days of Exercise per Week: 0 days    Minutes of Exercise per Session: 0 min  Stress: No Stress Concern Present (10/26/2017)   [place]    Feeling of Stress : Not at all  Social Connections: Somewhat Isolated (10/26/2017)   Social Connection and Isolation Panel [NHANES]    Frequency of Communication with Friends and Family: More than three times a week    Frequency of Social Gatherings with Friends and Family: More than three times a week    Attends Religious Services: 1 to 4 times per year    Active Member of [employer]  or Organizations: No    Attends Archivist Meetings: Never    Marital Status: Widowed    Tobacco Counseling Counseling given: Not Answered   Clinical Intake:  Pre-visit preparation  completed: Yes  Pain : 0-10 Pain Score: 3  Pain Type: Chronic pain Pain Location: Coccyx Pain Descriptors / Indicators: Aching Pain Onset: More than a month ago Pain Frequency: Constant     BMI - recorded: 30 Nutritional Status: BMI > 30  Obese Nutritional Risks: None Diabetes: No  How often do you need to have someone help you when you read instructions, pamphlets, or other written materials from your doctor or pharmacy?: 1 - Never  Diabetic?no         Activities of Daily Living    11/29/2022    8:55 AM  In your present state of health, do you have any difficulty performing the following activities:  Hearing? 0  Vision? 0  Difficulty concentrating or making decisions? 0  Walking or climbing stairs? 0  Dressing or bathing? 0  Doing errands, shopping? 0  Preparing Food and eating ? N  Using the Toilet? N  In the past six months, have you accidently leaked urine? N  Do you have problems with loss of bowel control? N  Managing your Medications? N  Managing your Finances? N  Housekeeping or managing your Housekeeping? N    Patient Care Team: [name], NP as PCP - General (Nurse Practitioner)  Indicate any recent Medical Services you may have received from other than [facility] providers in the past year (date may be approximate).     Assessment:   This is a routine wellness examination for [facility].  Hearing/Vision screen Hearing Screening - Comments:: Patient has no hearing problems Vision Screening - Comments:: Patient recently had eye exam 2 weeks ago. Patient wears glasses. Patient sees Dr. Gershon Crane  Dietary issues and exercise activities discussed: Current Exercise Habits: Home exercise routine, Type of exercise: stretching   Goals Addressed   None    Depression Screen    11/29/2022    8:36 AM 07/07/2022   10:03 AM 02/18/2022    8:23 AM 12/20/2021    8:49 AM 11/11/2021   10:39 AM 11/05/2020    9:15 AM 03/13/2020   10:32 AM  PHQ 2/9 Scores  PHQ - 2  Score 0 0 0 0 0 0 0    Fall Risk    11/29/2022    8:37 AM 07/07/2022   10:03 AM 02/18/2022    8:22 AM 12/30/2021    8:29 AM 12/20/2021    8:49 AM  Fall Risk   Falls in the past year? 0 0 0 0 0  Number falls in past yr: 0 0 0 0 0  Injury with Fall? 0 0 0 0 0  Risk for fall due to : No Fall Risks No Fall Risks No Fall Risks No Fall Risks No Fall Risks  Follow up Falls evaluation completed Falls evaluation completed Falls evaluation completed Falls evaluation completed Falls evaluation completed    FALL RISK PREVENTION PERTAINING TO THE HOME:  Any stairs in or around the home? Yes  If so, are there any without handrails? No  Home free of loose throw rugs in walkways, pet beds, electrical cords, etc? Yes  Adequate lighting in your home to reduce risk of falls? Yes   ASSISTIVE DEVICES UTILIZED TO PREVENT FALLS:  Life alert? No  Use of a cane, walker or w/c? No  Grab bars in  the bathroom? Yes  Shower chair or bench in shower? No  Elevated toilet seat or a handicapped toilet? No   TIMED UP AND GO:  Was the test performed? No .   Cognitive Function:    10/29/2018    8:40 AM 10/26/2017    8:41 AM 10/20/2016    9:50 AM 10/22/2015   10:48 AM 07/08/2014    8:30 AM  MMSE - Mini Mental State Exam  Orientation to time '5 4 5 5 5  '$ Orientation to Place '5 5 5 5 5  '$ Registration '3 3 3 3 3  '$ Attention/ Calculation '5 5 5 5 5  '$ Recall '2 2 3 3 2  '$ Language- name 2 objects '2 2 2 2 2  '$ Language- repeat '1 1 1 1 1  '$ Language- follow 3 step command '3 3 3 2 3  '$ Language- read & follow direction '1 1 1 1 1  '$ Write a sentence '1 1 1 1 1  '$ Copy design '1 1 1 1 1  '$ Total score '29 28 30 29 29        '$ 11/29/2022    8:38 AM 11/11/2021   10:44 AM 11/05/2020    9:20 AM 11/04/2019    8:40 AM  6CIT Screen  What Year?  0 points 0 points 0 points  What month? 0 points 0 points 0 points 0 points  What time? 0 points 0 points 0 points 0 points  Count back from 20 4 points 0 points 0 points 0 points  Months in reverse 0  points 0 points 2 points 0 points  Repeat phrase 4 points 0 points 2 points 0 points  Total Score  0 points 4 points 0 points    Immunizations Immunization History  Administered Date(s) Administered   Fluad Quad(high Dose 65+) 08/13/2021, 08/05/2022   Influenza, High Dose Seasonal PF 08/26/2017, 08/06/2018, 07/28/2019, 08/13/2020   Influenza,inj,Quad PF,6+ Mos 07/08/2014   Influenza-Unspecified 08/09/2012, 08/01/2013, 06/30/2015, 08/04/2016, 07/31/2017   Moderna Covid-19 Vaccine Bivalent Booster 10yr & up 08/05/2022   PFIZER(Purple Top)SARS-COV-2 Vaccination 11/19/2019, 12/10/2019, 07/22/2020   PNEUMOCOCCAL CONJUGATE-20 09/29/2022   Pfizer Covid-19 Vaccine Bivalent Booster 142yr& up 08/13/2021   Pneumococcal Conjugate-13 11/03/2014   Pneumococcal Polysaccharide-23 02/19/2013, 08/20/2017   Td 10/31/2000   Tdap 10/22/2015   Varicella 06/15/2011   Zoster Recombinat (Shingrix) 03/21/2017, 06/04/2017, 10/18/2022   Zoster, Live 10/31/2010    TDAP status: Up to date  Flu Vaccine status: Up to date  Pneumococcal vaccine status: Up to date  Covid-19 vaccine status: Information provided on how to obtain vaccines.   Qualifies for Shingles Vaccine? Yes   Zostavax completed No   Shingrix Completed?: Yes  Screening Tests Health Maintenance  Topic Date Due   COVID-19 Vaccine (6 - 2023-24 season) 02/28/2023 (Originally 09/30/2022)   Medicare Annual Wellness (AWV)  11/30/2023   DTaP/Tdap/Td (3 - Td or Tdap) 10/21/2025   Pneumonia Vaccine 6561Years old  Completed   INFLUENZA VACCINE  Completed   DEXA SCAN  Completed   Hepatitis C Screening  Completed   Zoster Vaccines- Shingrix  Completed   HPV VACCINES  Aged Out   COLONOSCOPY (Pts [id number] coverage will need to be confirmed)  Discontinued    Health Maintenance  There are no preventive care reminders to display for this patient.   Colorectal cancer screening: No longer required.   Mammogram status: No longer  required due to age.  Bone Density status: Completed 05/13/22. Results reflect: Bone density results: OSTEOPOROSIS. Repeat every  2 years.  Lung Cancer Screening: (Low Dose CT Chest recommended if Age 71-80 years, 30 pack-year currently smoking OR have quit w/in 15years.) does not qualify.   Lung Cancer Screening Referral: na  Additional Screening:  Hepatitis C Screening: does qualify; Completed 2016  Vision Screening: Recommended annual ophthalmology exams for early detection of glaucoma and other disorders of the eye. Is the patient up to date with their annual eye exam?  Yes  Who is the provider or what is the name of the office in which the patient attends annual eye exams? Dr Gershon Crane If pt is not established with a provider, would they like to be referred to a provider to establish care? No .   Dental Screening: Recommended annual dental exams for proper oral hygiene  Community Resource Referral / Chronic Care Management: CRR required this visit?  No   CCM required this visit?  No      Plan:     I have personally reviewed and noted the following in the patient's chart:   Medical and social history Use of alcohol, tobacco or illicit drugs  Current medications and supplements including opioid prescriptions. Patient is currently taking opioid prescriptions. Information provided to patient regarding non-opioid alternatives. Patient advised to discuss non-opioid treatment plan with their provider. Functional ability and status Nutritional status Physical activity Advanced directives List of other physicians Hospitalizations, surgeries, and ER visits in previous 12 months Vitals Screenings to include cognitive, depression, and falls Referrals and appointments  In addition, I have reviewed and discussed with patient certain preventive protocols, quality metrics, and best practice recommendations. A written personalized care plan for preventive services as well as general  preventive health recommendations were provided to patient.     [name], NP   11/29/2022  Virtual Visit via Video Note  I connected with Bernadette Hoit on 11/29/22 at  8:40 AM EST by a video enabled telemedicine application and verified that I am speaking with the correct person using two identifiers.  Location: Patient: home Provider: [place]   I discussed the limitations of evaluation and management by telemedicine and the availability of in person appointments. The patient expressed understanding and agreed to proceed.    I discussed the assessment and treatment plan with the patient. The patient was provided an opportunity to ask questions and all were answered. The patient agreed with the plan and demonstrated an understanding of the instructions.   The patient was advised to call back or seek an in-person evaluation if the symptoms worsen or if the condition fails to improve as anticipated.  I provided 15 minutes of non-face-to-face time during this encounter.  Carlos American. Dewaine Oats, AGNP Avs printed and mailed.

## 2022-11-29 NOTE — Patient Instructions (Signed)
Colleen [name] , Thank you for taking time to come for your Medicare Wellness Visit. I appreciate your ongoing commitment to your health goals. Please review the following plan we discussed and let me know if I can assist you in the future.   Screening recommendations/referrals: Colonoscopy aged out Mammogram yearly Bone Density up to date Recommended yearly ophthalmology/optometry visit for glaucoma screening and checkup Recommended yearly dental visit for hygiene and checkup  Vaccinations: Influenza vaccine- due annually in September/October Pneumococcal vaccine up to date Tdap vaccine up to date Shingles vaccine up tod ate    Advanced directives: on file.  Conditions/risks identified: advanced age, hyperlipidemia, hypertension.  Next appointment: yearly    Preventive Care 1 Years and Older, Female Preventive care refers to lifestyle choices and visits with your health care provider that can promote health and wellness. What does preventive care include? A yearly physical exam. This is also called an annual well check. Dental exams once or twice a year. Routine eye exams. Ask your health care provider how often you should have your eyes checked. Personal lifestyle choices, including: Daily care of your teeth and gums. Regular physical activity. Eating a healthy diet. Avoiding tobacco and drug use. Limiting alcohol use. Practicing safe sex. Taking low-dose aspirin every day. Taking vitamin and mineral supplements as recommended by your health care provider. What happens during an annual well check? The services and screenings done by your health care provider during your annual well check will depend on your age, overall health, lifestyle risk factors, and family history of disease. Counseling  Your health care provider may ask you questions about your: Alcohol use. Tobacco use. Drug use. Emotional well-being. Home and relationship well-being. Sexual activity. Eating  habits. History of falls. Memory and ability to understand (cognition). Work and work Statistician. Reproductive health. Screening  You may have the following tests or measurements: Height, weight, and BMI. Blood pressure. Lipid and cholesterol levels. These may be checked every 5 years, or more frequently if you are over 10 years old. Skin check. Lung cancer screening. You may have this screening every year starting at age 69 if you have a 30-pack-year history of smoking and currently smoke or have quit within the past 15 years. Fecal occult blood test (FOBT) of the stool. You may have this test every year starting at age 63. Flexible sigmoidoscopy or colonoscopy. You may have a sigmoidoscopy every 5 years or a colonoscopy every 10 years starting at age 26. Hepatitis C blood test. Hepatitis B blood test. Sexually transmitted disease (STD) testing. Diabetes screening. This is done by checking your blood sugar (glucose) after you have not eaten for a while (fasting). You may have this done every 1-3 years. Bone density scan. This is done to screen for osteoporosis. You may have this done starting at age 29. Mammogram. This may be done every 1-2 years. Talk to your health care provider about how often you should have regular mammograms. Talk with your health care provider about your test results, treatment options, and if necessary, the need for more tests. Vaccines  Your health care provider may recommend certain vaccines, such as: Influenza vaccine. This is recommended every year. Tetanus, diphtheria, and acellular pertussis (Tdap, Td) vaccine. You may need a Td booster every 10 years. Zoster vaccine. You may need this after age 53. Pneumococcal 13-valent conjugate (PCV13) vaccine. One dose is recommended after age 4. Pneumococcal polysaccharide (PPSV23) vaccine. One dose is recommended after age 63. Talk to your health care provider  about which screenings and vaccines you need and how  often you need them. This information is not intended to replace advice given to you by your health care provider. Make sure you discuss any questions you have with your health care provider. Document Released: 11/13/2015 Document Revised: 07/06/2016 Document Reviewed: 08/18/2015 Elsevier Interactive Patient Education  2017 [place] Prevention in the Home Falls can cause injuries. They can happen to people of all ages. There are many things you can do to make your home safe and to help prevent falls. What can I do on the outside of my home? Regularly fix the edges of walkways and driveways and fix any cracks. Remove anything that might make you trip as you walk through a door, such as a raised step or threshold. Trim any bushes or trees on the path to your home. Use bright outdoor lighting. Clear any walking paths of anything that might make someone trip, such as rocks or tools. Regularly check to see if handrails are loose or broken. Make sure that both sides of any steps have handrails. Any raised decks and porches should have guardrails on the edges. Have any leaves, snow, or ice cleared regularly. Use sand or salt on walking paths during winter. Clean up any spills in your garage right away. This includes oil or grease spills. What can I do in the bathroom? Use night lights. Install grab bars by the toilet and in the tub and shower. Do not use towel bars as grab bars. Use non-skid mats or decals in the tub or shower. If you need to sit down in the shower, use a plastic, non-slip stool. Keep the floor dry. Clean up any water that spills on the floor as soon as it happens. Remove soap buildup in the tub or shower regularly. Attach bath mats securely with double-sided non-slip rug tape. Do not have throw rugs and other things on the floor that can make you trip. What can I do in the bedroom? Use night lights. Make sure that you have a light by your bed that is easy to  reach. Do not use any sheets or blankets that are too big for your bed. They should not hang down onto the floor. Have a firm chair that has side arms. You can use this for support while you get dressed. Do not have throw rugs and other things on the floor that can make you trip. What can I do in the kitchen? Clean up any spills right away. Avoid walking on wet floors. Keep items that you use a lot in easy-to-reach places. If you need to reach something above you, use a strong step stool that has a grab bar. Keep electrical cords out of the way. Do not use floor polish or wax that makes floors slippery. If you must use wax, use non-skid floor wax. Do not have throw rugs and other things on the floor that can make you trip. What can I do with my stairs? Do not leave any items on the stairs. Make sure that there are handrails on both sides of the stairs and use them. Fix handrails that are broken or loose. Make sure that handrails are as long as the stairways. Check any carpeting to make sure that it is firmly attached to the stairs. Fix any carpet that is loose or worn. Avoid having throw rugs at the top or bottom of the stairs. If you do have throw rugs, attach them to the floor  with carpet tape. Make sure that you have a light switch at the top of the stairs and the bottom of the stairs. If you do not have them, ask someone to add them for you. What else can I do to help prevent falls? Wear shoes that: Do not have high heels. Have rubber bottoms. Are comfortable and fit you well. Are closed at the toe. Do not wear sandals. If you use a stepladder: Make sure that it is fully opened. Do not climb a closed stepladder. Make sure that both sides of the stepladder are locked into place. Ask someone to hold it for you, if possible. Clearly mark and make sure that you can see: Any grab bars or handrails. First and last steps. Where the edge of each step is. Use tools that help you move  around (mobility aids) if they are needed. These include: Canes. Walkers. Scooters. Crutches. Turn on the lights when you go into a dark area. Replace any light bulbs as soon as they burn out. Set up your furniture so you have a clear path. Avoid moving your furniture around. If any of your floors are uneven, fix them. If there are any pets around you, be aware of where they are. Review your medicines with your doctor. Some medicines can make you feel dizzy. This can increase your chance of falling. Ask your doctor what other things that you can do to help prevent falls. This information is not intended to replace advice given to you by your health care provider. Make sure you discuss any questions you have with your health care provider. Document Released: 08/13/2009 Document Revised: 03/24/2016 Document Reviewed: 11/21/2014 Elsevier Interactive Patient Education  2017 [employer].

## 2022-11-29 NOTE — Progress Notes (Signed)
This service is provided via telemedicine  No vital signs collected/recorded due to the encounter was a telemedicine visit.   Location of patient (ex: home, work):  Home  Patient consents to a telephone visit:  Yes, see encounter dated 07/07/2022  Location of the provider (ex: office, home):  [place]  Name of any referring provider:  N/A  Names of all persons participating in the telemedicine service and their role in the encounter:  Sherrie Mustache, Nurse Practitioner, Carroll Kinds, CMA, and patient.   Time spent on call:  10 minutes with medical assistant'

## 2022-12-07 ENCOUNTER — Telehealth: Payer: Self-pay | Admitting: Internal Medicine

## 2022-12-07 NOTE — Telephone Encounter (Signed)
Daughter wanting pt to be seen for constipation. Pt scheduled to see Alonza Bogus PA 2/13 at 1:30pm. Pts daughter aware of appt.

## 2022-12-07 NOTE — Telephone Encounter (Signed)
Inbound call from patients daughter stating that patient has been dealing with constipation. Patient has increased her water intake, also still taking her Linzess and nothing seems to be helping. Patient daughter is calling to discuss further. Please advise.

## 2022-12-11 ENCOUNTER — Encounter: Payer: Self-pay | Admitting: Nurse Practitioner

## 2022-12-11 DIAGNOSIS — M159 Polyosteoarthritis, unspecified: Secondary | ICD-10-CM

## 2022-12-11 DIAGNOSIS — M545 Low back pain, unspecified: Secondary | ICD-10-CM

## 2022-12-12 ENCOUNTER — Ambulatory Visit: Payer: Medicare HMO | Admitting: Gastroenterology

## 2022-12-13 ENCOUNTER — Ambulatory Visit: Payer: Medicare HMO | Admitting: Gastroenterology

## 2022-12-13 ENCOUNTER — Encounter: Payer: Self-pay | Admitting: Gastroenterology

## 2022-12-13 VITALS — BP 120/58 | HR 64 | Ht 61.0 in | Wt 159.4 lb

## 2022-12-13 DIAGNOSIS — K5900 Constipation, unspecified: Secondary | ICD-10-CM

## 2022-12-13 NOTE — Progress Notes (Signed)
Noted  

## 2022-12-13 NOTE — Patient Instructions (Addendum)
  If you are age 79 or older, your body mass index should be between 23-30. Your Body mass index is 30.13 kg/m. If this is out of the aforementioned range listed, please consider follow up with your Primary Care Provider.  If you are age 12 or younger, your body mass index should be between 19-25. Your Body mass index is 30.13 kg/m. If this is out of the aformentioned range listed, please consider follow up with your Primary Care Provider.   Increase Linzess to 290 mcg daily. Please send a mychart message in 2 weeks with an update,  The [facility] providers would like to encourage you to use [facility] to communicate with providers for non-urgent requests or questions.  Due to long hold times on the telephone, sending your provider a message by [facility] may be a faster and more efficient way to get a response.  Please allow 48 business hours for a response.  Please remember that this is for non-urgent requests.   It was a pleasure to see you today!  Thank you for trusting me with your gastrointestinal care!    Alonza Bogus, PA-C

## 2022-12-13 NOTE — Progress Notes (Signed)
12/13/2022 Colleen [name] [id number] 02/14/1946   HISTORY OF PRESENT ILLNESS: This is a pleasant 77 year old female who is a patient Dr. Blanch Media.  She has issues with chronic constipation.  She was given Linzess 145 mcg daily back probably it looks like 2019.  She has been taking that since then and it seemed to work well.  Then as of late she has been having issues moving her bowels again despite taking that.  She has had add a couple doses of MiraLAX daily to her regimen.  She has needed to use MiraLAX and magnesium citrate for bowel purges.  Does have some lower abdominal discomfort when she is really constipated, but then that relieves after she has a good bowel movement.  She is on tramadol and gabapentin for chronic pain and is going to be seeing pain management later this month.  No rectal bleeding.  Colonoscopy January 2020 showed diverticulosis, internal hemorrhoids, 2 polyps that were removed and were tubular adenomas.  Past Medical History:  Diagnosis Date   Allergic rhinitis    Anxiety    Arthritis    Arthritis of left wrist 10/13/2021   Atrophic vaginitis 06/25/2019   Biceps tendinitis 02/07/2022   Breast screening 10/26/2010   Last Assessment & Plan:  Formatting of this note might be different from the original. Will order a mammogram for this fall;  Follow up with me around Thanksgiving.   Callus of heel 02/19/2013   Cardiac murmur 10/09/2020   Colon polyps    adenomatous   Constipation 08/20/2013   Corns and callosities    Diverticulosis    DOE (dyspnea on exertion) 10/09/2020   Dysuria 06/03/2011   Last Assessment & Plan:  Formatting of this note might be different from the original. New, acute complaint today;  Will check urinalysis with her labs today;   Full thickness rotator cuff tear 02/07/2022   GERD (gastroesophageal reflux disease)    Glaucoma    H/O total hysterectomy 02/19/2013   Hallux valgus 10/26/2010   Last Assessment & Plan:  Formatting of this  note might be different from the original. Had surgery with Dr Jomarie Longs back in October;   Will have her follow up with him.   Heartburn [id number]   Helicobacter pylori gastritis 08/12/2014   Hematoma of lower leg 06/12/2018   Hiatal hernia with gastroesophageal reflux 12/23/2014   Hypercholesteremia    no meds   Hypercholesterolemia 06/03/2011   Overview:  TC 205 (elevated) in 2010  Last Assessment & Plan:  Formatting of this note might be different from the original. Lab Results  Component Value Date   CHOL 205 12/09/2011   CHOL 233 06/03/2011   CHOL 205 05/26/2009   Lab Results  Component Value Date   HDL 82 12/09/2011   HDL 90 06/03/2011   HDL 75 05/26/2009   Lab Results  Component Value Date   LDLCALC 113 12/09/2011   [place] 133 06/03/2011   LDLCA   Hypothyroidism    Left-sided low back pain with left-sided sciatica 08/12/2014   Neck pain 10/26/2010   Obesity    Osteoarthritis 10/26/2010   Osteoarthritis of acromioclavicular joint 02/07/2022   Osteopenia 01/08/2014   Overweight 10/26/2010   Postmenopausal estrogen deficiency 02/19/2013   Recurrent UTI 06/25/2019   Restless leg syndrome 08/20/2013   Routine general medical examination at a health care facility 02/19/2013   S/P carpal tunnel release 10/26/2010   Screen for colon cancer 12/16/2011   Last Assessment & Plan:  Formatting of this note might be different from the original. Refer to GI;   Screening for diabetes mellitus    Shoulder pain, right 12/16/2011   Last Assessment & Plan:  Formatting of this note might be different from the original. We have tried physical therapy;   We shot and reviewed films today;  She has  1)  Osteopenia 2)  Small spur at humeral head 3)  Some hypertrophic/lipping changes at the inferior glenoid rim;   I also injected it last visit;  She is on NSAIDS and tylenol;  Will trial voltaren gel;  Then get ortho opinion;   Skin spots-aging 07/08/2014   Strain of neck muscle 06/12/2018   Urge incontinence    Whiplash  injury 06/26/2018   Past Surgical History:  Procedure Laterality Date   ABDOMINAL HYSTERECTOMY  1985   Dr Candace Gallus Right    Dr Jomarie Longs   CARPAL TUNNEL RELEASE Bilateral 2010   Dr Laurance Flatten   CATARACT EXTRACTION Bilateral    CESAREAN SECTION  1983   1 time   COLONOSCOPY     ROTATOR CUFF REPAIR Left 03/10/2022   Dr Theda Sers   THYROIDECTOMY  1987   Dr Guadlupe Spanish   TONSILLECTOMY  1951    reports that she quit smoking about 36 years ago. Her smoking use included cigarettes. She has never used smokeless tobacco. She reports that she does not drink alcohol and does not use drugs. family history includes Breast cancer in her maternal aunt; COPD in her brother; Cancer in her maternal grandfather; Diabetes in her brother and maternal grandmother; Leukemia (age of onset: 8) in her mother; Lung cancer in her brother and father. No Known Allergies    Outpatient Encounter Medications as of 12/13/2022  Medication Sig   Ascorbic Acid (VITAMIN C PO) Take 1 tablet by mouth daily.   aspirin EC 81 MG tablet Take 81 mg by mouth every other day. Swallow whole.   atorvastatin (LIPITOR) 10 MG tablet Take 0.5 tablets (5 mg total) by mouth daily. E78.00   calcium carbonate (OS-CAL) 600 MG TABS tablet Take 600 mg by mouth daily.   Cholecalciferol 50 MCG (2000 UT) TABS Take 1 tablet by mouth daily.   CRANBERRY PO Take 1 tablet by mouth daily.   diclofenac Sodium (VOLTAREN) 1 % GEL APPLY 2 GRAMS TOPICALLY 4 (FOUR) TIMES DAILY AS NEEDED.   gabapentin (NEURONTIN) 100 MG capsule Take 2 capsules (200 mg total) by mouth 2 (two) times daily.   hydrochlorothiazide (HYDRODIURIL) 12.5 MG tablet Take 1 tablet (12.5 mg total) by mouth daily.   hydroxypropyl methylcellulose / hypromellose (ISOPTO TEARS / GONIOVISC) 2.5 % ophthalmic solution Place 1 drop into both eyes as needed for dry eyes.    levothyroxine (SYNTHROID) 25 MCG tablet TAKE 1 TABLET BY MOUTH EVERY DAY BEFORE BREAKFAST   linaclotide (LINZESS) 145  MCG CAPS capsule TAKE 1 CAPSULE (145 MCG TOTAL) BY MOUTH DAILY BEFORE BREAKFAST. OFFICE VISIT FOR FURTHER REFILLS   Multiple Vitamin (MULTIVITAMIN WITH MINERALS) TABS Take 1 tablet by mouth daily.   omeprazole (PRILOSEC) 40 MG capsule TAKE 1 CAPSULE BY MOUTH EVERY DAY   traMADol (ULTRAM) 50 MG tablet TAKE 1-2 TABLET BY MOUTH EVERY 6 HOURS AS NEEDED FOR PAIN- MAX 6 IN 24 HOURS   [DISCONTINUED] meloxicam (MOBIC) 7.5 MG tablet Take 7.5 mg by mouth daily.   No facility-administered encounter medications on file as of 12/13/2022.     REVIEW OF SYSTEMS  : All other systems reviewed  and negative except where noted in the History of Present Illness.   PHYSICAL EXAM: BP (!) 120/58 (BP Location: Left Arm, Patient Position: Sitting, Cuff Size: Normal)   Pulse 64   Ht 5' 1"$  (1.549 m)   Wt 159 lb 7 oz (72.3 kg)   BMI 30.13 kg/m  General: Well developed female in no acute distress Head: Normocephalic and atraumatic Eyes:  Sclerae anicteric, conjunctiva pink. Ears: Normal auditory acuity Lungs: Clear throughout to auscultation; no W/R/R. Heart: Regular rate and rhythm; no M/R/G. Abdomen: Soft, non-distended.  BS present.  Non-tender. Musculoskeletal: Symmetrical with no gross deformities  Skin: No lesions on visible extremities Extremities: No edema  Neurological: Alert oriented x 4, grossly non-focal Psychological:  Alert and cooperative. Normal mood and affect  ASSESSMENT AND PLAN: *Chronic constipation: On Linzess 145 mcg daily, which used to work for her, but now has lost efficacy.  Will try to increase that to 290 mcg daily.  She will use up the prescription that she has and just take 2 at a time.  They will let us know in about 2 weeks if that seems to be working well then we can send new prescription for the 290 mcg.  If not we can try something different maybe Amitiza versus Movantik versus Motegrity as she likely has some narcotic/opioid induced component as well.   CC:  [name], NP

## 2022-12-22 ENCOUNTER — Other Ambulatory Visit: Payer: Self-pay

## 2022-12-22 DIAGNOSIS — M545 Low back pain, unspecified: Secondary | ICD-10-CM

## 2022-12-22 DIAGNOSIS — M159 Polyosteoarthritis, unspecified: Secondary | ICD-10-CM

## 2022-12-22 DIAGNOSIS — M25512 Pain in left shoulder: Secondary | ICD-10-CM

## 2022-12-22 DIAGNOSIS — M150 Primary generalized (osteo)arthritis: Secondary | ICD-10-CM

## 2022-12-22 NOTE — Telephone Encounter (Signed)
Response discussed with patient and she verbalized understanding

## 2022-12-22 NOTE — Telephone Encounter (Signed)
Patient is requesting a refill of the following medications: Requested Prescriptions   Pending Prescriptions Disp Refills   traMADol (ULTRAM) 50 MG tablet 90 tablet 0    Sig: TAKE 1-2 TABLET BY MOUTH EVERY 6 HOURS AS NEEDED FOR PAIN- MAX 6 IN 24 HOURS    Date of last refill: 11/29/2022  Refill amount: 90  Treatment agreement date: Outdated, notation made on pending appointment for 02/20/2023. Patient has appointment tomorrow to see pain specialist (Hug[facility])

## 2022-12-22 NOTE — Telephone Encounter (Signed)
If she is going to see the pain specialist will not refill medication and let them prescribe what they recommend

## 2022-12-23 DIAGNOSIS — Z79891 Long term (current) use of opiate analgesic: Secondary | ICD-10-CM | POA: Diagnosis not present

## 2022-12-23 DIAGNOSIS — M545 Low back pain, unspecified: Secondary | ICD-10-CM | POA: Diagnosis not present

## 2022-12-23 DIAGNOSIS — G8929 Other chronic pain: Secondary | ICD-10-CM | POA: Diagnosis not present

## 2022-12-30 MED ORDER — LUBIPROSTONE 24 MCG PO CAPS: 24.0000 ug | ORAL_CAPSULE | Freq: Two times a day (BID) | ORAL | 3 refills | Status: DC

## 2022-12-30 NOTE — Addendum Note (Signed)
Addended by: Timothy Lasso on: 12/30/2022 09:29 AM   Modules accepted: Orders

## 2023-01-09 ENCOUNTER — Other Ambulatory Visit (HOSPITAL_COMMUNITY): Payer: Self-pay

## 2023-01-17 DIAGNOSIS — G8929 Other chronic pain: Secondary | ICD-10-CM | POA: Diagnosis not present

## 2023-01-17 DIAGNOSIS — M47816 Spondylosis without myelopathy or radiculopathy, lumbar region: Secondary | ICD-10-CM | POA: Diagnosis not present

## 2023-01-17 DIAGNOSIS — M545 Low back pain, unspecified: Secondary | ICD-10-CM | POA: Diagnosis not present

## 2023-01-17 DIAGNOSIS — Z79891 Long term (current) use of opiate analgesic: Secondary | ICD-10-CM | POA: Diagnosis not present

## 2023-01-18 ENCOUNTER — Telehealth: Payer: Self-pay

## 2023-01-18 NOTE — Telephone Encounter (Signed)
PA request received via CMM for Lubiprostone 24MCG capsules  PA has been submitted to [facility] and is pending determination.  Key: Letitia Libra

## 2023-01-18 NOTE — Telephone Encounter (Signed)
The pt is taking Amitiza twice daily.

## 2023-01-19 ENCOUNTER — Other Ambulatory Visit: Payer: Self-pay

## 2023-01-19 MED ORDER — MOTEGRITY 2 MG PO TABS: 2.0000 mg | ORAL_TABLET | Freq: Every day | ORAL | 3 refills | Status: DC

## 2023-01-23 NOTE — Telephone Encounter (Signed)
PA has been APPROVED from 01/18/2023-10/31/2023

## 2023-02-01 ENCOUNTER — Other Ambulatory Visit: Payer: Self-pay | Admitting: Nurse Practitioner

## 2023-02-01 DIAGNOSIS — R600 Localized edema: Secondary | ICD-10-CM

## 2023-02-03 ENCOUNTER — Other Ambulatory Visit: Payer: Self-pay | Admitting: Physician Assistant

## 2023-02-03 DIAGNOSIS — M545 Low back pain, unspecified: Secondary | ICD-10-CM

## 2023-02-15 DIAGNOSIS — M545 Low back pain, unspecified: Secondary | ICD-10-CM | POA: Diagnosis not present

## 2023-02-15 DIAGNOSIS — M47816 Spondylosis without myelopathy or radiculopathy, lumbar region: Secondary | ICD-10-CM | POA: Diagnosis not present

## 2023-02-15 DIAGNOSIS — G8929 Other chronic pain: Secondary | ICD-10-CM | POA: Diagnosis not present

## 2023-02-15 DIAGNOSIS — M25512 Pain in left shoulder: Secondary | ICD-10-CM | POA: Diagnosis not present

## 2023-02-15 DIAGNOSIS — Z79891 Long term (current) use of opiate analgesic: Secondary | ICD-10-CM | POA: Diagnosis not present

## 2023-02-20 ENCOUNTER — Ambulatory Visit: Payer: Medicare HMO | Admitting: Nurse Practitioner

## 2023-02-20 ENCOUNTER — Telehealth: Payer: Self-pay

## 2023-02-20 NOTE — Telephone Encounter (Signed)
PA request received via CMM for Motegrity  tablets  PA has been submitted to [facility] and is pending determination  Key: [id number]

## 2023-02-20 NOTE — Telephone Encounter (Signed)
PA Approved

## 2023-02-23 ENCOUNTER — Other Ambulatory Visit: Payer: Self-pay | Admitting: Nurse Practitioner

## 2023-02-23 DIAGNOSIS — E7800 Pure hypercholesterolemia, unspecified: Secondary | ICD-10-CM

## 2023-02-24 ENCOUNTER — Ambulatory Visit: Payer: Medicare HMO | Admitting: Nurse Practitioner

## 2023-02-24 VITALS — BP 126/72 | HR 72 | Ht 61.0 in | Wt 164.4 lb

## 2023-02-24 DIAGNOSIS — E039 Hypothyroidism, unspecified: Secondary | ICD-10-CM | POA: Diagnosis not present

## 2023-02-24 DIAGNOSIS — M159 Polyosteoarthritis, unspecified: Secondary | ICD-10-CM | POA: Diagnosis not present

## 2023-02-24 DIAGNOSIS — I10 Essential (primary) hypertension: Secondary | ICD-10-CM | POA: Diagnosis not present

## 2023-02-24 DIAGNOSIS — G8929 Other chronic pain: Secondary | ICD-10-CM | POA: Diagnosis not present

## 2023-02-24 DIAGNOSIS — M545 Low back pain, unspecified: Secondary | ICD-10-CM

## 2023-02-24 DIAGNOSIS — M810 Age-related osteoporosis without current pathological fracture: Secondary | ICD-10-CM | POA: Diagnosis not present

## 2023-02-24 DIAGNOSIS — E7800 Pure hypercholesterolemia, unspecified: Secondary | ICD-10-CM | POA: Diagnosis not present

## 2023-02-24 DIAGNOSIS — M150 Primary generalized (osteo)arthritis: Secondary | ICD-10-CM

## 2023-02-24 MED ORDER — OMEPRAZOLE 40 MG PO CPDR: 40.0000 mg | DELAYED_RELEASE_CAPSULE | Freq: Every day | ORAL | 1 refills | Status: DC

## 2023-02-24 MED ORDER — DENOSUMAB 60 MG/ML ~~LOC~~ SOSY
60.0000 mg | PREFILLED_SYRINGE | Freq: Once | SUBCUTANEOUS | Status: AC
  Administered 2023-02-24: 60 mg via SUBCUTANEOUS

## 2023-02-24 NOTE — Progress Notes (Signed)
[name]: Patient Care Team: Sharon Seller, NP as PCP - General (Nurse Practitioner)  PLACE OF SERVICE:  [facility]  Advanced Directive information    No Known Allergies  Chief Complaint  Patient presents with   Medical Management of Chronic Issues    Patient presents today for a 6 month follow-up     HPI: Patient is a 77 y.o. female for routine follow up.   Has gone to GI for chronic constipation has changed medication a few times but still does not have good effect. Has recently added fiber. Getting better about drinking water.  She is eating more protein. Not hungry a lot so does not eat regularly.   She is seeing Pain specialist- America Brown at [facility]. Placed on butrans patch.  She was placed on gabapentin but started swelling- so changed to lyrica TID Also getting MRI of back.   Due for refill on omeprazole, GERD well controlled.   Review of Systems:  Review of Systems  Constitutional:  Negative for chills, fever and weight loss.  HENT:  Negative for tinnitus.   Respiratory:  Negative for cough, sputum production and shortness of breath.   Cardiovascular:  Negative for chest pain, palpitations and leg swelling.  Gastrointestinal:  Positive for constipation. Negative for abdominal pain, diarrhea and heartburn.  Genitourinary:  Negative for dysuria, frequency and urgency.  Musculoskeletal:  Positive for back pain. Negative for falls, joint pain and myalgias.  Skin: Negative.   Neurological:  Negative for dizziness and headaches.  Psychiatric/Behavioral:  Negative for depression and memory loss. The patient does not have insomnia.     Past Medical History:  Diagnosis Date   Allergic rhinitis    Anxiety    Arthritis    Arthritis of left wrist 10/13/2021   Atrophic vaginitis 06/25/2019   Biceps tendinitis 02/07/2022   Breast screening 10/26/2010   Last Assessment & Plan:  Formatting of this note might be different from the original. Will order a  mammogram for this fall;  Follow up with me around Thanksgiving.   Callus of heel 02/19/2013   Cardiac murmur 10/09/2020   Colon polyps    adenomatous   Constipation 08/20/2013   Corns and callosities    Diverticulosis    DOE (dyspnea on exertion) 10/09/2020   Dysuria 06/03/2011   Last Assessment & Plan:  Formatting of this note might be different from the original. New, acute complaint today;  Will check urinalysis with her labs today;   Full thickness rotator cuff tear 02/07/2022   GERD (gastroesophageal reflux disease)    Glaucoma    H/O total hysterectomy 02/19/2013   Hallux valgus 10/26/2010   Last Assessment & Plan:  Formatting of this note might be different from the original. Had surgery with Dr Ernestine Conrad back in October;   Will have her follow up with him.   Heartburn 04/30/2014   Helicobacter pylori gastritis 08/12/2014   Hematoma of lower leg 06/12/2018   Hiatal hernia with gastroesophageal reflux 12/23/2014   Hypercholesteremia    no meds   Hypercholesterolemia 06/03/2011   Overview:  TC 205 (elevated) in 2010  Last Assessment & Plan:  Formatting of this note might be different from the original. Lab Results  Component Value Date   CHOL 205 12/09/2011   CHOL 233 06/03/2011   CHOL 205 05/26/2009   Lab Results  Component Value Date   HDL 82 12/09/2011   HDL 90 06/03/2011   HDL 75 05/26/2009   Lab  Results  Component Value Date   LDLCALC 113 12/09/2011   LDLCALC 133 06/03/2011   LDLCA   Hypothyroidism    Left-sided low back pain with left-sided sciatica 08/12/2014   Neck pain 10/26/2010   Obesity    Osteoarthritis 10/26/2010   Osteoarthritis of acromioclavicular joint 02/07/2022   Osteopenia 01/08/2014   Overweight 10/26/2010   Postmenopausal estrogen deficiency 02/19/2013   Recurrent UTI 06/25/2019   Restless leg syndrome 08/20/2013   Routine general medical examination at a health care facility 02/19/2013   S/P carpal tunnel release 10/26/2010   Screen for colon cancer 12/16/2011   Last  Assessment & Plan:  Formatting of this note might be different from the original. Refer to GI;   Screening for diabetes mellitus    Shoulder pain, right 12/16/2011   Last Assessment & Plan:  Formatting of this note might be different from the original. We have tried physical therapy;   We shot and reviewed films today;  She has  1)  Osteopenia 2)  Small spur at humeral head 3)  Some hypertrophic/lipping changes at the inferior glenoid rim;   I also injected it last visit;  She is on NSAIDS and tylenol;  Will trial voltaren gel;  Then get ortho opinion;   Skin spots-aging 07/08/2014   Strain of neck muscle 06/12/2018   Urge incontinence    Whiplash injury 06/26/2018   Past Surgical History:  Procedure Laterality Date   ABDOMINAL HYSTERECTOMY  1985   Dr Kathyrn Drown Right    Dr Ernestine Conrad   CARPAL TUNNEL RELEASE Bilateral 2010   Dr Christell Constant   CATARACT EXTRACTION Bilateral    CESAREAN SECTION  1983   1 time   COLONOSCOPY     ROTATOR CUFF REPAIR Left 03/10/2022   Dr Thomasena Edis   THYROIDECTOMY  1987   Dr Raechel Ache   TONSILLECTOMY  1951   Social History:   reports that she quit smoking about 36 years ago. Her smoking use included cigarettes. She has never used smokeless tobacco. She reports that she does not drink alcohol and does not use drugs.  Family History  Problem Relation Age of Onset   Leukemia Mother 42   Lung cancer Father    Diabetes Brother    COPD Brother    Lung cancer Brother    Diabetes Maternal Grandmother    Cancer Maternal Grandfather        unknown type   Breast cancer Maternal Aunt    Colon polyps Neg Hx    Rectal cancer Neg Hx     Medications: Patient's Medications  New Prescriptions   No medications on file  Previous Medications   ASCORBIC ACID (VITAMIN C PO)    Take 1 tablet by mouth daily.   ASPIRIN EC 81 MG TABLET    Take 81 mg by mouth every other day. Swallow whole.   ATORVASTATIN (LIPITOR) 10 MG TABLET    TAKE 0.5 TABLETS (5 MG TOTAL) BY MOUTH  DAILY. E78.00   CALCIUM CARBONATE (OS-CAL) 600 MG TABS TABLET    Take 600 mg by mouth daily.   CHOLECALCIFEROL 50 MCG (2000 UT) TABS    Take 1 tablet by mouth daily.   CRANBERRY PO    Take 1 tablet by mouth daily.   DICLOFENAC SODIUM (VOLTAREN) 1 % GEL    APPLY 2 GRAMS TOPICALLY 4 (FOUR) TIMES DAILY AS NEEDED.   HYDROCHLOROTHIAZIDE (HYDRODIURIL) 12.5 MG TABLET    TAKE 1 TABLET BY MOUTH EVERY DAY  HYDROXYPROPYL METHYLCELLULOSE / HYPROMELLOSE (ISOPTO TEARS / GONIOVISC) 2.5 % OPHTHALMIC SOLUTION    Place 1 drop into both eyes as needed for dry eyes.    LEVOTHYROXINE (SYNTHROID) 25 MCG TABLET    TAKE 1 TABLET BY MOUTH EVERY DAY BEFORE BREAKFAST   MULTIPLE VITAMIN (MULTIVITAMIN WITH MINERALS) TABS    Take 1 tablet by mouth daily.   OMEPRAZOLE (PRILOSEC) 40 MG CAPSULE    TAKE 1 CAPSULE BY MOUTH EVERY DAY   PRUCALOPRIDE SUCCINATE (MOTEGRITY) 2 MG TABS    Take 1 tablet (2 mg total) by mouth daily.  Modified Medications   No medications on file  Discontinued Medications   GABAPENTIN (NEURONTIN) 100 MG CAPSULE    Take 2 capsules (200 mg total) by mouth 2 (two) times daily.   TRAMADOL (ULTRAM) 50 MG TABLET    TAKE 1-2 TABLET BY MOUTH EVERY 6 HOURS AS NEEDED FOR PAIN- MAX 6 IN 24 HOURS    Physical Exam:  Vitals:   02/24/23 0943  BP: 126/72  Pulse: 72  SpO2: 98%  Weight: 164 lb 6.4 oz (74.6 kg)  Height: 5\' 1"  (1.549 m)   Body mass index is 31.06 kg/m. Wt Readings from Last 3 Encounters:  02/24/23 164 lb 6.4 oz (74.6 kg)  12/13/22 159 lb 7 oz (72.3 kg)  08/22/22 159 lb (72.1 kg)    Physical Exam Constitutional:      General: She is not in acute distress.    Appearance: She is well-developed. She is not diaphoretic.  HENT:     Head: Normocephalic and atraumatic.     Mouth/Throat:     Pharynx: No oropharyngeal exudate.  Eyes:     Conjunctiva/sclera: Conjunctivae normal.     Pupils: Pupils are equal, round, and reactive to light.  Cardiovascular:     Rate and Rhythm: Normal rate and  regular rhythm.     Heart sounds: Normal heart sounds.  Pulmonary:     Effort: Pulmonary effort is normal.     Breath sounds: Normal breath sounds.  Abdominal:     General: Bowel sounds are normal.     Palpations: Abdomen is soft.  Musculoskeletal:     Cervical back: Normal range of motion and neck supple.     Right lower leg: No edema.     Left lower leg: No edema.  Skin:    General: Skin is warm and dry.  Neurological:     Mental Status: She is alert.  Psychiatric:        Mood and Affect: Mood normal.     Labs reviewed: Basic Metabolic Panel: Recent Labs    08/22/22 1040  NA 144  K 4.2  CL 107  CO2 26  GLUCOSE 89  BUN 23  CREATININE 1.01*  CALCIUM 9.8   Liver Function Tests: Recent Labs    08/22/22 1040  AST 30  ALT 13  BILITOT 0.5  PROT 6.8   No results for input(s): "LIPASE", "AMYLASE" in the last 8760 hours. No results for input(s): "AMMONIA" in the last 8760 hours. CBC: Recent Labs    08/22/22 1040  WBC 3.4*  NEUTROABS 1,190*  HGB 11.6*  HCT 36.5  MCV 95.3  PLT 301   Lipid Panel: No results for input(s): "CHOL", "HDL", "LDLCALC", "TRIG", "CHOLHDL", "LDLDIRECT" in the last 8760 hours. TSH: No results for input(s): "TSH" in the last 8760 hours. A1C: Lab Results  Component Value Date   HGBA1C 5.6 07/05/2014     Assessment/Plan 1. Osteoporosis without  current pathological fracture, unspecified osteoporosis type -Recommended to take calcium 600 mg twice daily with Vitamin D 2000 units daily and weight bearing activity 30 mins/5 days a week - denosumab (PROLIA) injection 60 mg  2. Chronic left-sided low back pain without sciatica -ongoing following with pain clinic, MRI pending.   3. Primary osteoarthritis involving multiple joints Stable with current pain management.   4. Primary hypertension -Blood pressure well controlled, goal bp <140/90 Continue current medications and dietary modifications follow metabolic panel - COMPLETE  METABOLIC PANEL WITH GFR - CBC with Differential/Platelet  5. Hypercholesteremia -continues on lipitor, states the half tablet is hard to cut, can do whole tablet every other day. - Lipid panel - COMPLETE METABOLIC PANEL WITH GFR  6. Acquired hypothyroidism -continues on synthroid 25 mcg - TSH  Return in about 6 months (around 08/26/2023) for routine follow up. Janene Harvey. Biagio Borg [facility] [phone]

## 2023-02-24 NOTE — Patient Instructions (Signed)
Please sign record release for pain management.

## 2023-02-25 LAB — LIPID PANEL
Cholesterol: 182 mg/dL (ref <200)
HDL: 81 mg/dL (ref >=50)
LDL Cholesterol (Calc): 85 mg/dL (calc)
Non-HDL Cholesterol (Calc): 101 mg/dL (calc) (ref <130)
Total CHOL/HDL Ratio: 2.2 (calc) (ref <5.0)
Triglycerides: 75 mg/dL (ref <150)

## 2023-02-25 LAB — CBC WITH DIFFERENTIAL/PLATELET
Absolute Monocytes: 172 cells/uL — ABNORMAL LOW (ref 200–950)
Basophils Absolute: 32 cells/uL (ref 0–200)
Basophils Relative: 0.9 %
Eosinophils Absolute: 60 cells/uL (ref 15–500)
Eosinophils Relative: 1.7 %
HCT: 36.1 % (ref 35.0–45.0)
Hemoglobin: 11.4 g/dL — ABNORMAL LOW (ref 11.7–15.5)
Lymphs Abs: 1904 cells/uL (ref 850–3900)
MCH: 29.6 pg (ref 27.0–33.0)
MCHC: 31.6 g/dL — ABNORMAL LOW (ref 32.0–36.0)
MCV: 93.8 fL (ref 80.0–100.0)
MPV: 12.3 fL (ref 7.5–12.5)
Monocytes Relative: 4.9 %
Neutro Abs: 1334 cells/uL — ABNORMAL LOW (ref 1500–7800)
Neutrophils Relative %: 38.1 %
Platelets: 213 10*3/uL (ref 140–400)
RBC: 3.85 10*6/uL (ref 3.80–5.10)
RDW: 11.6 % (ref 11.0–15.0)
Total Lymphocyte: 54.4 %
WBC: 3.5 10*3/uL — ABNORMAL LOW (ref 3.8–10.8)

## 2023-02-25 LAB — COMPLETE METABOLIC PANEL WITH GFR
AG Ratio: 1.8 (calc) (ref 1.0–2.5)
ALT: 15 U/L (ref 6–29)
AST: 30 U/L (ref 10–35)
Albumin: 4.2 g/dL (ref 3.6–5.1)
Alkaline phosphatase (APISO): 64 U/L (ref 37–153)
BUN/Creatinine Ratio: 17 (calc) (ref 6–22)
BUN: 19 mg/dL (ref 7–25)
CO2: 29 mmol/L (ref 20–32)
Calcium: 9.2 mg/dL (ref 8.6–10.4)
Chloride: 106 mmol/L (ref 98–110)
Creat: 1.14 mg/dL — ABNORMAL HIGH (ref 0.60–1.00)
Globulin: 2.3 g/dL (calc) (ref 1.9–3.7)
Glucose, Bld: 129 mg/dL — ABNORMAL HIGH (ref 65–99)
Potassium: 4 mmol/L (ref 3.5–5.3)
Sodium: 143 mmol/L (ref 135–146)
Total Bilirubin: 0.5 mg/dL (ref 0.2–1.2)
Total Protein: 6.5 g/dL (ref 6.1–8.1)
eGFR: 49 mL/min/{1.73_m2} — ABNORMAL LOW (ref >=60)

## 2023-02-25 LAB — TSH: TSH: 0.67 mIU/L (ref 0.40–4.50)

## 2023-02-27 ENCOUNTER — Encounter: Payer: Self-pay | Admitting: Nurse Practitioner

## 2023-02-27 NOTE — Telephone Encounter (Signed)
HCTZ removed from active medication list

## 2023-02-28 ENCOUNTER — Other Ambulatory Visit: Payer: Self-pay | Admitting: Nurse Practitioner

## 2023-02-28 ENCOUNTER — Ambulatory Visit
Admission: RE | Admit: 2023-02-28 | Discharge: 2023-02-28 | Disposition: A | Discharge status: Home / Self Care | Payer: Medicare HMO | Source: Ambulatory Visit | Attending: Physician Assistant | Admitting: Physician Assistant

## 2023-02-28 DIAGNOSIS — M159 Polyosteoarthritis, unspecified: Secondary | ICD-10-CM

## 2023-02-28 DIAGNOSIS — M545 Low back pain, unspecified: Secondary | ICD-10-CM | POA: Diagnosis not present

## 2023-02-28 DIAGNOSIS — M150 Primary generalized (osteo)arthritis: Secondary | ICD-10-CM

## 2023-03-13 ENCOUNTER — Encounter: Payer: Self-pay | Admitting: Nurse Practitioner

## 2023-03-14 MED ORDER — HYDROCHLOROTHIAZIDE 12.5 MG PO TABS: 6.2500 mg | ORAL_TABLET | Freq: Every day | ORAL | 1 refills | Status: DC

## 2023-03-23 DIAGNOSIS — G8929 Other chronic pain: Secondary | ICD-10-CM | POA: Diagnosis not present

## 2023-03-23 DIAGNOSIS — M25512 Pain in left shoulder: Secondary | ICD-10-CM | POA: Diagnosis not present

## 2023-03-23 DIAGNOSIS — Z79891 Long term (current) use of opiate analgesic: Secondary | ICD-10-CM | POA: Diagnosis not present

## 2023-03-23 DIAGNOSIS — M47816 Spondylosis without myelopathy or radiculopathy, lumbar region: Secondary | ICD-10-CM | POA: Diagnosis not present

## 2023-03-23 DIAGNOSIS — M545 Low back pain, unspecified: Secondary | ICD-10-CM | POA: Diagnosis not present

## 2023-04-27 ENCOUNTER — Other Ambulatory Visit: Payer: Self-pay | Admitting: Nurse Practitioner

## 2023-05-01 DIAGNOSIS — M5416 Radiculopathy, lumbar region: Secondary | ICD-10-CM | POA: Diagnosis not present

## 2023-05-17 DIAGNOSIS — Z79891 Long term (current) use of opiate analgesic: Secondary | ICD-10-CM | POA: Diagnosis not present

## 2023-05-17 DIAGNOSIS — M25512 Pain in left shoulder: Secondary | ICD-10-CM | POA: Diagnosis not present

## 2023-05-17 DIAGNOSIS — M545 Low back pain, unspecified: Secondary | ICD-10-CM | POA: Diagnosis not present

## 2023-05-17 DIAGNOSIS — M47816 Spondylosis without myelopathy or radiculopathy, lumbar region: Secondary | ICD-10-CM | POA: Diagnosis not present

## 2023-05-17 DIAGNOSIS — G8929 Other chronic pain: Secondary | ICD-10-CM | POA: Diagnosis not present

## 2023-05-22 ENCOUNTER — Other Ambulatory Visit: Payer: Self-pay | Admitting: Nurse Practitioner

## 2023-05-22 ENCOUNTER — Ambulatory Visit
Admission: RE | Admit: 2023-05-22 | Discharge: 2023-05-22 | Disposition: A | Discharge status: Home / Self Care | Payer: Medicare HMO | Source: Ambulatory Visit | Attending: Nurse Practitioner | Admitting: Nurse Practitioner

## 2023-05-22 DIAGNOSIS — M25512 Pain in left shoulder: Secondary | ICD-10-CM

## 2023-05-22 DIAGNOSIS — R937 Abnormal findings on diagnostic imaging of other parts of musculoskeletal system: Secondary | ICD-10-CM | POA: Diagnosis not present

## 2023-05-22 DIAGNOSIS — M25511 Pain in right shoulder: Secondary | ICD-10-CM

## 2023-05-22 DIAGNOSIS — G8929 Other chronic pain: Secondary | ICD-10-CM | POA: Diagnosis not present

## 2023-05-27 ENCOUNTER — Other Ambulatory Visit: Payer: Self-pay | Admitting: Nurse Practitioner

## 2023-05-27 DIAGNOSIS — M159 Polyosteoarthritis, unspecified: Secondary | ICD-10-CM

## 2023-05-30 DIAGNOSIS — M25512 Pain in left shoulder: Secondary | ICD-10-CM | POA: Diagnosis not present

## 2023-05-30 DIAGNOSIS — Z4789 Encounter for other orthopedic aftercare: Secondary | ICD-10-CM | POA: Diagnosis not present

## 2023-06-03 DIAGNOSIS — M25512 Pain in left shoulder: Secondary | ICD-10-CM | POA: Diagnosis not present

## 2023-06-08 DIAGNOSIS — M19112 Post-traumatic osteoarthritis, left shoulder: Secondary | ICD-10-CM | POA: Diagnosis not present

## 2023-06-08 DIAGNOSIS — M25512 Pain in left shoulder: Secondary | ICD-10-CM | POA: Diagnosis not present

## 2023-06-09 ENCOUNTER — Encounter: Payer: Self-pay | Admitting: Nurse Practitioner

## 2023-06-12 ENCOUNTER — Other Ambulatory Visit: Payer: Self-pay

## 2023-06-12 ENCOUNTER — Ambulatory Visit: Payer: Medicare HMO | Attending: Orthopedic Surgery

## 2023-06-12 DIAGNOSIS — M25612 Stiffness of left shoulder, not elsewhere classified: Secondary | ICD-10-CM | POA: Diagnosis not present

## 2023-06-12 DIAGNOSIS — R6889 Other general symptoms and signs: Secondary | ICD-10-CM | POA: Insufficient documentation

## 2023-06-12 DIAGNOSIS — M25512 Pain in left shoulder: Secondary | ICD-10-CM | POA: Diagnosis not present

## 2023-06-12 DIAGNOSIS — M6281 Muscle weakness (generalized): Secondary | ICD-10-CM | POA: Diagnosis not present

## 2023-06-12 DIAGNOSIS — M19112 Post-traumatic osteoarthritis, left shoulder: Secondary | ICD-10-CM | POA: Insufficient documentation

## 2023-06-12 DIAGNOSIS — G8929 Other chronic pain: Secondary | ICD-10-CM | POA: Insufficient documentation

## 2023-06-12 NOTE — Therapy (Signed)
OUTPATIENT PHYSICAL THERAPY SHOULDER EVALUATION   Patient Name: Colleen [name] MRN: [id number] DOB:February 19, 1946, 77 y.o., female Today's Date: 06/12/2023  END OF SESSION:  PT End of Session - 06/12/23 1347     Date for PT Re-Evaluation 07/24/23    Progress Note Due on Visit 10    PT Start Time 0936    PT Stop Time 1020    PT Time Calculation (min) 44 min    Activity Tolerance Patient limited by pain    Behavior During Therapy [facility] for tasks assessed/performed;Anxious             Past Medical History:  Diagnosis Date   Allergic rhinitis    Anxiety    Arthritis    Arthritis of left wrist 10/13/2021   Atrophic vaginitis 06/25/2019   Biceps tendinitis 02/07/2022   Breast screening 10/26/2010   Last Assessment & Plan:  Formatting of this note might be different from the original. Will order a mammogram for this fall;  Follow up with me around Thanksgiving.   Callus of heel 02/19/2013   Cardiac murmur 10/09/2020   Colon polyps    adenomatous   Constipation 08/20/2013   Corns and callosities    Diverticulosis    DOE (dyspnea on exertion) 10/09/2020   Dysuria 06/03/2011   Last Assessment & Plan:  Formatting of this note might be different from the original. New, acute complaint today;  Will check urinalysis with her labs today;   Full thickness rotator cuff tear 02/07/2022   GERD (gastroesophageal reflux disease)    Glaucoma    H/O total hysterectomy 02/19/2013   Hallux valgus 10/26/2010   Last Assessment & Plan:  Formatting of this note might be different from the original. Had surgery with Dr Ernestine Conrad back in October;   Will have her follow up with him.   Heartburn 04/30/2014   Helicobacter pylori gastritis 08/12/2014   Hematoma of lower leg 06/12/2018   Hiatal hernia with gastroesophageal reflux 12/23/2014   Hypercholesteremia    no meds   Hypercholesterolemia 06/03/2011   Overview:  TC 205 (elevated) in 2010  Last Assessment & Plan:  Formatting of this note might be  different from the original. Lab Results  Component Value Date   CHOL 205 12/09/2011   CHOL 233 06/03/2011   CHOL 205 05/26/2009   Lab Results  Component Value Date   HDL 82 12/09/2011   HDL 90 06/03/2011   HDL 75 05/26/2009   Lab Results  Component Value Date   LDLCALC 113 12/09/2011   LDLCALC 133 06/03/2011   LDLCA   Hypothyroidism    Left-sided low back pain with left-sided sciatica 08/12/2014   Neck pain 10/26/2010   Obesity    Osteoarthritis 10/26/2010   Osteoarthritis of acromioclavicular joint 02/07/2022   Osteopenia 01/08/2014   Overweight 10/26/2010   Postmenopausal estrogen deficiency 02/19/2013   Recurrent UTI 06/25/2019   Restless leg syndrome 08/20/2013   Routine general medical examination at a health care facility 02/19/2013   S/P carpal tunnel release 10/26/2010   Screen for colon cancer 12/16/2011   Last Assessment & Plan:  Formatting of this note might be different from the original. Refer to GI;   Screening for diabetes mellitus    Shoulder pain, right 12/16/2011   Last Assessment & Plan:  Formatting of this note might be different from the original. We have tried physical therapy;   We shot and reviewed films today;  She has  1)  Osteopenia 2)  Small spur  at humeral head 3)  Some hypertrophic/lipping changes at the inferior glenoid rim;   I also injected it last visit;  She is on NSAIDS and tylenol;  Will trial voltaren gel;  Then get ortho opinion;   Skin spots-aging 07/08/2014   Strain of neck muscle 06/12/2018   Urge incontinence    Whiplash injury 06/26/2018   Past Surgical History:  Procedure Laterality Date   ABDOMINAL HYSTERECTOMY  1985   Dr Kathyrn Drown Right    Dr Ernestine Conrad   CARPAL TUNNEL RELEASE Bilateral 2010   Dr Christell Constant   CATARACT EXTRACTION Bilateral    CESAREAN SECTION  1983   1 time   COLONOSCOPY     ROTATOR CUFF REPAIR Left 03/10/2022   Dr Thomasena Edis   THYROIDECTOMY  1987   Dr Raechel Ache   TONSILLECTOMY  1951   Patient Active Problem List   Diagnosis  Date Noted   Biceps tendinitis 02/07/2022   Full thickness rotator cuff tear 02/07/2022   Cardiac murmur 10/09/2020   Allergic rhinitis    Anxiety    Arthritis    Corns and callosities    Diverticulosis    Glaucoma    Hypercholesteremia    Obesity    Urge incontinence    Atrophic vaginitis 06/25/2019   Whiplash injury 06/26/2018   Strain of neck muscle 06/12/2018   Hiatal hernia with gastroesophageal reflux 12/23/2014   Left-sided low back pain with left-sided sciatica 08/12/2014   Osteopenia 01/08/2014   Restless leg syndrome 08/20/2013   Constipation 08/20/2013   Postmenopausal estrogen deficiency 02/19/2013   Hypothyroidism 10/26/2010   Osteoarthritis 10/26/2010   S/P carpal tunnel release 10/26/2010   Hallux valgus 10/26/2010    PCP: Abbey Chatters, NP  REFERRING PROVIDER: Beverely Low, MD  REFERRING DIAG: L shoulder rotator cuff syndrome, pain  THERAPY DIAG:  Chronic left shoulder pain  Decreased functional activity tolerance  Muscle weakness of left arm  Stiffness of left shoulder, not elsewhere classified  Rationale for Evaluation and Treatment: Rehabilitation  ONSET DATE: MAY 2023  SUBJECTIVE:                                                                                                                                                                                      SUBJECTIVE STATEMENT: L shoulder hurts badly, all the time.  MD says I need shoulder replacement but need to wait until complete 6 weeks PT Hand dominance: Right  PERTINENT HISTORY: May 2023 had L shoulder rotator cuff repair.   PAIN:  Are you having pain? Yes: NPRS scale: 9-10/10 Pain location: L shoulder upper arm upper back Pain description: constant aching, skin is tender  Aggravating factors: movement, palpation, sleeping Relieving factors: using nursing pillow for sleeping, taking tramadol with little effect  PRECAUTIONS: None  RED FLAGS: None   WEIGHT BEARING  RESTRICTIONS: No  FALLS:  Has patient fallen in last 6 months? No  LIVING ENVIRONMENT: Lives with: lives with their family Lives in: House/apartment Stairs: Yes: External:   steps; on right going up Has following equipment at home: None  OCCUPATION: retired  PLOF: Independent with basic ADLs  PATIENT GOALS:need relief from this pain  NEXT MD VISIT:   OBJECTIVE:   DIAGNOSTIC FINDINGS:  Actual MRI not available, but daughter and pt report MRI shows deteriorated rotator cuff L , as well as arthritic changes L shoulder  PATIENT SURVEYS:  Quick Dash 84% disability  COGNITION: Overall cognitive status: Within functional limits for tasks assessed     SENSATION: WFL  POSTURE: Guarded posture, l shoulder protracted and depressed compared to R with loss of deltoid musculature  UPPER EXTREMITY ROM: L hand, wrist, forearm, elbow all wnl AROM although strained for full L elbow flexion movement.   Patient did not tolerate L shoulder PROM, noted able to elevate to 85 degrees scaption for donning,doffing shirt.  Patient unable to attempt other movements such as reach behind back or hand behind neck/upper back.  UPPER EXTREMITY MMT: L hand grasp 4/5, all other L UE MMT not performed due to inflammatory nature of pain L UE  JOINT MOBILITY TESTING:  Unable to assess  PALPATION:  Hypersensitive, exquisite tenderness L shoulder jt, upper arm   TODAY'S TREATMENT:                                                                                                                                         DATE: 06/12/23:  Eval, attempted light low grade isometrics for flex, ext L shoulder with increased pain noted by pt Iontophoresis, applied ibresis patch with 0.04% dexamethasome L ant shoulder over superior biceps. Advised to leave patch in place for additional medication absorption, for another 3 1/2 hrs post this appt. Also kinesiotaping, 2- I pieces applied, one lat upper arm to upper traps,  and one ant upper arm to upper traps, utilized to reduce strain rotator cuff L   PATIENT EDUCATION: Education details: POC, goals Person educated: Patient and Child(ren) Education method: Explanation, Demonstration, Tactile cues, and Verbal cues Education comprehension: verbalized understanding, verbal cues required, and tactile cues required  HOME EXERCISE PROGRAM: TBD  ASSESSMENT:  CLINICAL IMPRESSION: Patient is a 77 y.o. female who was evaluated today by skilled  physical therapy to address recently worsening L shoulder pain/ weakness.  Her L shoulder is very inflamed, she was unable to tolerate even light palpation , and light isometrics increased her pain to the point of being nauseated.  Spent much of session educating in positioning of L shoulder with pillow under axilla, also tried to educate to utilize isometrics for pain  management , also light pendulums, with R hand supporting L elbow. Her orthopedic surgeon plans L reverse TSA but referred to PT to complete requisite treatment as required by insurance prior.  She does need to try to regain some motion/ flexibility L shoulder in order to maximize her outcome, also if any improvement with modalities, would benefit from their utilization.  She may be developing adhesive capsulitis due to large, non localized area of pain L UE. OBJECTIVE IMPAIRMENTS: decreased ROM, decreased strength, and pain.   ACTIVITY LIMITATIONS: carrying, lifting, sleeping, bed mobility, bathing, toileting, dressing, reach over head, and hygiene/grooming  PARTICIPATION LIMITATIONS: meal prep, cleaning, laundry, driving, shopping, and community activity  PERSONAL FACTORS: Age, Behavior pattern, Past/current experiences, Time since onset of injury/illness/exacerbation, and 1-2 comorbidities: L shoulder rotator cuff repair prior,   are also affecting patient's functional outcome.   REHAB POTENTIAL: Fair    CLINICAL DECISION MAKING:  Stable/uncomplicated  EVALUATION COMPLEXITY: Low   GOALS: Goals reviewed with patient? Yes  SHORT TERM GOALS: Target date: 2 weeks 06/26/23  I HEP Baseline: Goal status: INITIAL  LONG TERM GOALS: Target date: 07/24/23  Improve ROM L shoulder for flex, abd, to 100 or greater, L shoulder ER 45 or greater Baseline: 85 degrees elevation, rotation not observed Goal status: INITIAL  2.  Improve quick DASh from 86% deficit to 50% or better Baseline:  Goal status: INITIAL  3.  Strength L shoulder biceps, flex, abd, to 3+/5 Baseline: 2+ to 3- Goal status: INITIAL    PLAN:  PT FREQUENCY: 1-2x/week  PT DURATION: 6 weeks  PLANNED INTERVENTIONS: Therapeutic exercises, Therapeutic activity, Neuromuscular re-education, Balance training, Gait training, Patient/Family education, Self Care, Joint mobilization, and Ionotophoresis 4mg /ml Dexamethasone  PLAN FOR NEXT SESSION: how was taping, how was ionto, can she tolerate any light stretching   Jaylene Arrowood L Trueman Worlds, PT, DPT, OCS 06/12/2023, 4:34 PM

## 2023-06-13 NOTE — Therapy (Signed)
OUTPATIENT PHYSICAL THERAPY SHOULDER TREATMENT   Patient Name: Colleen [name] MRN: [id number] DOB:19-Oct-1946, 77 y.o., female Today's Date: 06/14/2023  END OF SESSION:  PT End of Session - 06/14/23 0926     Visit Number 2    Date for PT Re-Evaluation 07/24/23    Progress Note Due on Visit 10    PT Start Time 0930    PT Stop Time 1015    PT Time Calculation (min) 45 min    Activity Tolerance Patient limited by pain    Behavior During Therapy [facility] for tasks assessed/performed;Anxious              Past Medical History:  Diagnosis Date   Allergic rhinitis    Anxiety    Arthritis    Arthritis of left wrist 10/13/2021   Atrophic vaginitis 06/25/2019   Biceps tendinitis 02/07/2022   Breast screening 10/26/2010   Last Assessment & Plan:  Formatting of this note might be different from the original. Will order a mammogram for this fall;  Follow up with me around Thanksgiving.   Callus of heel 02/19/2013   Cardiac murmur 10/09/2020   Colon polyps    adenomatous   Constipation 08/20/2013   Corns and callosities    Diverticulosis    DOE (dyspnea on exertion) 10/09/2020   Dysuria 06/03/2011   Last Assessment & Plan:  Formatting of this note might be different from the original. New, acute complaint today;  Will check urinalysis with her labs today;   Full thickness rotator cuff tear 02/07/2022   GERD (gastroesophageal reflux disease)    Glaucoma    H/O total hysterectomy 02/19/2013   Hallux valgus 10/26/2010   Last Assessment & Plan:  Formatting of this note might be different from the original. Had surgery with Dr Ernestine Conrad back in October;   Will have her follow up with him.   Heartburn 04/30/2014   Helicobacter pylori gastritis 08/12/2014   Hematoma of lower leg 06/12/2018   Hiatal hernia with gastroesophageal reflux 12/23/2014   Hypercholesteremia    no meds   Hypercholesterolemia 06/03/2011   Overview:  TC 205 (elevated) in 2010  Last Assessment & Plan:  Formatting of this  note might be different from the original. Lab Results  Component Value Date   CHOL 205 12/09/2011   CHOL 233 06/03/2011   CHOL 205 05/26/2009   Lab Results  Component Value Date   HDL 82 12/09/2011   HDL 90 06/03/2011   HDL 75 05/26/2009   Lab Results  Component Value Date   LDLCALC 113 12/09/2011   LDLCALC 133 06/03/2011   LDLCA   Hypothyroidism    Left-sided low back pain with left-sided sciatica 08/12/2014   Neck pain 10/26/2010   Obesity    Osteoarthritis 10/26/2010   Osteoarthritis of acromioclavicular joint 02/07/2022   Osteopenia 01/08/2014   Overweight 10/26/2010   Postmenopausal estrogen deficiency 02/19/2013   Recurrent UTI 06/25/2019   Restless leg syndrome 08/20/2013   Routine general medical examination at a health care facility 02/19/2013   S/P carpal tunnel release 10/26/2010   Screen for colon cancer 12/16/2011   Last Assessment & Plan:  Formatting of this note might be different from the original. Refer to GI;   Screening for diabetes mellitus    Shoulder pain, right 12/16/2011   Last Assessment & Plan:  Formatting of this note might be different from the original. We have tried physical therapy;   We shot and reviewed films today;  She has  1)  Osteopenia 2)  Small spur at humeral head 3)  Some hypertrophic/lipping changes at the inferior glenoid rim;   I also injected it last visit;  She is on NSAIDS and tylenol;  Will trial voltaren gel;  Then get ortho opinion;   Skin spots-aging 07/08/2014   Strain of neck muscle 06/12/2018   Urge incontinence    Whiplash injury 06/26/2018   Past Surgical History:  Procedure Laterality Date   ABDOMINAL HYSTERECTOMY  1985   Dr Kathyrn Drown Right    Dr Ernestine Conrad   CARPAL TUNNEL RELEASE Bilateral 2010   Dr Christell Constant   CATARACT EXTRACTION Bilateral    CESAREAN SECTION  1983   1 time   COLONOSCOPY     ROTATOR CUFF REPAIR Left 03/10/2022   Dr Thomasena Edis   THYROIDECTOMY  1987   Dr Raechel Ache   TONSILLECTOMY  1951   Patient Active Problem List    Diagnosis Date Noted   Biceps tendinitis 02/07/2022   Full thickness rotator cuff tear 02/07/2022   Cardiac murmur 10/09/2020   Allergic rhinitis    Anxiety    Arthritis    Corns and callosities    Diverticulosis    Glaucoma    Hypercholesteremia    Obesity    Urge incontinence    Atrophic vaginitis 06/25/2019   Whiplash injury 06/26/2018   Strain of neck muscle 06/12/2018   Hiatal hernia with gastroesophageal reflux 12/23/2014   Left-sided low back pain with left-sided sciatica 08/12/2014   Osteopenia 01/08/2014   Restless leg syndrome 08/20/2013   Constipation 08/20/2013   Postmenopausal estrogen deficiency 02/19/2013   Hypothyroidism 10/26/2010   Osteoarthritis 10/26/2010   S/P carpal tunnel release 10/26/2010   Hallux valgus 10/26/2010    PCP: Abbey Chatters, NP  REFERRING PROVIDER: Beverely Low, MD  REFERRING DIAG: L shoulder rotator cuff syndrome, pain  THERAPY DIAG:  Chronic left shoulder pain  Decreased functional activity tolerance  Muscle weakness of left arm  Stiffness of left shoulder, not elsewhere classified  Rationale for Evaluation and Treatment: Rehabilitation  ONSET DATE: MAY 2023  SUBJECTIVE:                                                                                                                                                                                      SUBJECTIVE STATEMENT: The shoulder is terrible. No different with the patch or tape.   Hand dominance: Right  PERTINENT HISTORY: May 2023 had L shoulder rotator cuff repair.   PAIN:  Are you having pain? Yes: NPRS scale: 9-10/10 Pain location: L shoulder upper arm upper back Pain description: constant aching, skin is tender Aggravating factors: movement,  palpation, sleeping Relieving factors: using nursing pillow for sleeping, taking tramadol with little effect  PRECAUTIONS: None  RED FLAGS: None   WEIGHT BEARING RESTRICTIONS: No  FALLS:  Has patient  fallen in last 6 months? No  LIVING ENVIRONMENT: Lives with: lives with their family Lives in: House/apartment Stairs: Yes: External:   steps; on right going up Has following equipment at home: None  OCCUPATION: retired  PLOF: Independent with basic ADLs  PATIENT GOALS:need relief from this pain  NEXT MD VISIT:   OBJECTIVE:   DIAGNOSTIC FINDINGS:  Actual MRI not available, but daughter and pt report MRI shows deteriorated rotator cuff L , as well as arthritic changes L shoulder  PATIENT SURVEYS:  Quick Dash 84% disability  COGNITION: Overall cognitive status: Within functional limits for tasks assessed     SENSATION: WFL  POSTURE: Guarded posture, l shoulder protracted and depressed compared to R with loss of deltoid musculature  UPPER EXTREMITY ROM: L hand, wrist, forearm, elbow all wnl AROM although strained for full L elbow flexion movement.   Patient did not tolerate L shoulder PROM, noted able to elevate to 85 degrees scaption for donning,doffing shirt.  Patient unable to attempt other movements such as reach behind back or hand behind neck/upper back.  UPPER EXTREMITY MMT: L hand grasp 4/5, all other L UE MMT not performed due to inflammatory nature of pain L UE  JOINT MOBILITY TESTING:  Unable to assess  PALPATION:  Hypersensitive, exquisite tenderness L shoulder jt, upper arm   TODAY'S TREATMENT:                                                                                                                                         DATE:  06/13/23 Attempted some light massage and PROM but unable to get much as she is very sensitive  Table slides flexion and abd 2x10 very minimal movement  MH and IFC x12 mins to L shoulder   06/12/23:  Eval, attempted light low grade isometrics for flex, ext L shoulder with increased pain noted by pt Iontophoresis, applied ibresis patch with 0.04% dexamethasome L ant shoulder over superior biceps. Advised to leave patch in  place for additional medication absorption, for another 3 1/2 hrs post this appt. Also kinesiotaping, 2- I pieces applied, one lat upper arm to upper traps, and one ant upper arm to upper traps, utilized to reduce strain rotator cuff L   PATIENT EDUCATION: Education details: POC, goals Person educated: Patient and Child(ren) Education method: Explanation, Demonstration, Tactile cues, and Verbal cues Education comprehension: verbalized understanding, verbal cues required, and tactile cues required  HOME EXERCISE PROGRAM: TBD  ASSESSMENT:  CLINICAL IMPRESSION: Patient is a 77 y.o. female who was seen for physical therapy treatment to address L shoulder pain/ weakness. She returns with high levels of pain and reports her shoulder is terrible. She states no real changes since eval  and ionto patch and taping did not seem to do much. She still had on K-tape from the evaluation, I went ahead and removed it as it was coming up on the edges. Patient was unable to tolerate even light palpation. We tried to do some gentle AAROM with table slides, but even this was very painful and brought tears to her eyes. Shifted session to focus just on pain modulation. We tried some IFC with a heat pack to see if this would help any.   OBJECTIVE IMPAIRMENTS: decreased ROM, decreased strength, and pain.   ACTIVITY LIMITATIONS: carrying, lifting, sleeping, bed mobility, bathing, toileting, dressing, reach over head, and hygiene/grooming  PARTICIPATION LIMITATIONS: meal prep, cleaning, laundry, driving, shopping, and community activity  PERSONAL FACTORS: Age, Behavior pattern, Past/current experiences, Time since onset of injury/illness/exacerbation, and 1-2 comorbidities: L shoulder rotator cuff repair prior,   are also affecting patient's functional outcome.   REHAB POTENTIAL: Fair    CLINICAL DECISION MAKING: Stable/uncomplicated  EVALUATION COMPLEXITY: Low   GOALS: Goals reviewed with patient? Yes  SHORT  TERM GOALS: Target date: 2 weeks 06/26/23  I HEP Baseline: Goal status: INITIAL  LONG TERM GOALS: Target date: 07/24/23  Improve ROM L shoulder for flex, abd, to 100 or greater, L shoulder ER 45 or greater Baseline: 85 degrees elevation, rotation not observed Goal status: INITIAL  2.  Improve quick DASh from 86% deficit to 50% or better Baseline:  Goal status: INITIAL  3.  Strength L shoulder biceps, flex, abd, to 3+/5 Baseline: 2+ to 3- Goal status: INITIAL    PLAN:  PT FREQUENCY: 1-2x/week  PT DURATION: 6 weeks  PLANNED INTERVENTIONS: Therapeutic exercises, Therapeutic activity, Neuromuscular re-education, Balance training, Gait training, Patient/Family education, Self Care, Joint mobilization, and Ionotophoresis 4mg /ml Dexamethasone  PLAN FOR NEXT SESSION: light movements as tolerated, work on bringing her pain levels down, did the e-stim help at all?   Cassie Freer, PT, DPT 06/14/2023, 10:13 AM

## 2023-06-14 ENCOUNTER — Ambulatory Visit: Payer: Medicare HMO

## 2023-06-14 DIAGNOSIS — M6281 Muscle weakness (generalized): Secondary | ICD-10-CM | POA: Diagnosis not present

## 2023-06-14 DIAGNOSIS — R6889 Other general symptoms and signs: Secondary | ICD-10-CM

## 2023-06-14 DIAGNOSIS — M19112 Post-traumatic osteoarthritis, left shoulder: Secondary | ICD-10-CM | POA: Diagnosis not present

## 2023-06-14 DIAGNOSIS — G8929 Other chronic pain: Secondary | ICD-10-CM | POA: Diagnosis not present

## 2023-06-14 DIAGNOSIS — M47816 Spondylosis without myelopathy or radiculopathy, lumbar region: Secondary | ICD-10-CM | POA: Diagnosis not present

## 2023-06-14 DIAGNOSIS — M25512 Pain in left shoulder: Secondary | ICD-10-CM | POA: Diagnosis not present

## 2023-06-14 DIAGNOSIS — Z79891 Long term (current) use of opiate analgesic: Secondary | ICD-10-CM | POA: Diagnosis not present

## 2023-06-14 DIAGNOSIS — M25612 Stiffness of left shoulder, not elsewhere classified: Secondary | ICD-10-CM | POA: Diagnosis not present

## 2023-06-14 DIAGNOSIS — M545 Low back pain, unspecified: Secondary | ICD-10-CM | POA: Diagnosis not present

## 2023-06-15 ENCOUNTER — Other Ambulatory Visit (HOSPITAL_BASED_OUTPATIENT_CLINIC_OR_DEPARTMENT_OTHER): Payer: Self-pay

## 2023-06-15 MED ORDER — HYDROCODONE-ACETAMINOPHEN 5-325 MG PO TABS
1.0000 | ORAL_TABLET | Freq: Three times a day (TID) | ORAL | 0 refills | Status: DC | PRN
  Filled 2023-06-15: qty 90, 30d supply, fill #0

## 2023-06-19 ENCOUNTER — Encounter: Payer: Self-pay | Admitting: Physical Therapy

## 2023-06-19 ENCOUNTER — Ambulatory Visit: Payer: Medicare HMO | Admitting: Physical Therapy

## 2023-06-19 DIAGNOSIS — M6281 Muscle weakness (generalized): Secondary | ICD-10-CM

## 2023-06-19 DIAGNOSIS — M25612 Stiffness of left shoulder, not elsewhere classified: Secondary | ICD-10-CM | POA: Diagnosis not present

## 2023-06-19 DIAGNOSIS — G8929 Other chronic pain: Secondary | ICD-10-CM | POA: Diagnosis not present

## 2023-06-19 DIAGNOSIS — M25512 Pain in left shoulder: Secondary | ICD-10-CM | POA: Diagnosis not present

## 2023-06-19 DIAGNOSIS — M19112 Post-traumatic osteoarthritis, left shoulder: Secondary | ICD-10-CM | POA: Diagnosis not present

## 2023-06-19 DIAGNOSIS — R6889 Other general symptoms and signs: Secondary | ICD-10-CM | POA: Diagnosis not present

## 2023-06-19 NOTE — Therapy (Signed)
OUTPATIENT PHYSICAL THERAPY SHOULDER TREATMENT   Patient Name: Colleen [name] MRN: [id number] DOB:05-02-1946, 77 y.o., female Today's Date: 06/19/2023  END OF SESSION:  PT End of Session - 06/19/23 0805     Visit Number 3    Date for PT Re-Evaluation 07/24/23    PT Start Time 0804    PT Stop Time 0845    PT Time Calculation (min) 41 min    Activity Tolerance Patient limited by pain    Behavior During Therapy [facility] for tasks assessed/performed;Anxious              Past Medical History:  Diagnosis Date   Allergic rhinitis    Anxiety    Arthritis    Arthritis of left wrist 10/13/2021   Atrophic vaginitis 06/25/2019   Biceps tendinitis 02/07/2022   Breast screening 10/26/2010   Last Assessment & Plan:  Formatting of this note might be different from the original. Will order a mammogram for this fall;  Follow up with me around Thanksgiving.   Callus of heel 02/19/2013   Cardiac murmur 10/09/2020   Colon polyps    adenomatous   Constipation 08/20/2013   Corns and callosities    Diverticulosis    DOE (dyspnea on exertion) 10/09/2020   Dysuria 06/03/2011   Last Assessment & Plan:  Formatting of this note might be different from the original. New, acute complaint today;  Will check urinalysis with her labs today;   Full thickness rotator cuff tear 02/07/2022   GERD (gastroesophageal reflux disease)    Glaucoma    H/O total hysterectomy 02/19/2013   Hallux valgus 10/26/2010   Last Assessment & Plan:  Formatting of this note might be different from the original. Had surgery with Dr Ernestine Conrad back in October;   Will have her follow up with him.   Heartburn 04/30/2014   Helicobacter pylori gastritis 08/12/2014   Hematoma of lower leg 06/12/2018   Hiatal hernia with gastroesophageal reflux 12/23/2014   Hypercholesteremia    no meds   Hypercholesterolemia 06/03/2011   Overview:  TC 205 (elevated) in 2010  Last Assessment & Plan:  Formatting of this note might be different from the  original. Lab Results  Component Value Date   CHOL 205 12/09/2011   CHOL 233 06/03/2011   CHOL 205 05/26/2009   Lab Results  Component Value Date   HDL 82 12/09/2011   HDL 90 06/03/2011   HDL 75 05/26/2009   Lab Results  Component Value Date   LDLCALC 113 12/09/2011   LDLCALC 133 06/03/2011   LDLCA   Hypothyroidism    Left-sided low back pain with left-sided sciatica 08/12/2014   Neck pain 10/26/2010   Obesity    Osteoarthritis 10/26/2010   Osteoarthritis of acromioclavicular joint 02/07/2022   Osteopenia 01/08/2014   Overweight 10/26/2010   Postmenopausal estrogen deficiency 02/19/2013   Recurrent UTI 06/25/2019   Restless leg syndrome 08/20/2013   Routine general medical examination at a health care facility 02/19/2013   S/P carpal tunnel release 10/26/2010   Screen for colon cancer 12/16/2011   Last Assessment & Plan:  Formatting of this note might be different from the original. Refer to GI;   Screening for diabetes mellitus    Shoulder pain, right 12/16/2011   Last Assessment & Plan:  Formatting of this note might be different from the original. We have tried physical therapy;   We shot and reviewed films today;  She has  1)  Osteopenia 2)  Small spur at humeral  head 3)  Some hypertrophic/lipping changes at the inferior glenoid rim;   I also injected it last visit;  She is on NSAIDS and tylenol;  Will trial voltaren gel;  Then get ortho opinion;   Skin spots-aging 07/08/2014   Strain of neck muscle 06/12/2018   Urge incontinence    Whiplash injury 06/26/2018   Past Surgical History:  Procedure Laterality Date   ABDOMINAL HYSTERECTOMY  1985   Dr Kathyrn Drown Right    Dr Ernestine Conrad   CARPAL TUNNEL RELEASE Bilateral 2010   Dr Christell Constant   CATARACT EXTRACTION Bilateral    CESAREAN SECTION  1983   1 time   COLONOSCOPY     ROTATOR CUFF REPAIR Left 03/10/2022   Dr Thomasena Edis   THYROIDECTOMY  1987   Dr Raechel Ache   TONSILLECTOMY  1951   Patient Active Problem List   Diagnosis Date Noted   Biceps  tendinitis 02/07/2022   Full thickness rotator cuff tear 02/07/2022   Cardiac murmur 10/09/2020   Allergic rhinitis    Anxiety    Arthritis    Corns and callosities    Diverticulosis    Glaucoma    Hypercholesteremia    Obesity    Urge incontinence    Atrophic vaginitis 06/25/2019   Whiplash injury 06/26/2018   Strain of neck muscle 06/12/2018   Hiatal hernia with gastroesophageal reflux 12/23/2014   Left-sided low back pain with left-sided sciatica 08/12/2014   Osteopenia 01/08/2014   Restless leg syndrome 08/20/2013   Constipation 08/20/2013   Postmenopausal estrogen deficiency 02/19/2013   Hypothyroidism 10/26/2010   Osteoarthritis 10/26/2010   S/P carpal tunnel release 10/26/2010   Hallux valgus 10/26/2010    PCP: Abbey Chatters, NP  REFERRING PROVIDER: Beverely Low, MD  REFERRING DIAG: L shoulder rotator cuff syndrome, pain  THERAPY DIAG:  Chronic left shoulder pain  Decreased functional activity tolerance  Muscle weakness of left arm  Stiffness of left shoulder, not elsewhere classified  Rationale for Evaluation and Treatment: Rehabilitation  ONSET DATE: MAY 2023  SUBJECTIVE:                                                                                                                                                                                      SUBJECTIVE STATEMENT:  "terrible"  "Tied of hurting"  Hand dominance: Right  PERTINENT HISTORY: May 2023 had L shoulder rotator cuff repair.   PAIN:  Are you having pain? Yes: NPRS scale: 10/10 Pain location: L shoulder upper arm upper back Pain description: constant aching, skin is tender Aggravating factors: movement, palpation, sleeping Relieving factors: using nursing pillow for sleeping, taking tramadol with little effect  PRECAUTIONS: None  RED FLAGS: None   WEIGHT BEARING RESTRICTIONS: No  FALLS:  Has patient fallen in last 6 months? No  LIVING ENVIRONMENT: Lives with: lives  with their family Lives in: House/apartment Stairs: Yes: External:   steps; on right going up Has following equipment at home: None  OCCUPATION: retired  PLOF: Independent with basic ADLs  PATIENT GOALS:need relief from this pain  NEXT MD VISIT:   OBJECTIVE:   DIAGNOSTIC FINDINGS:  Actual MRI not available, but daughter and pt report MRI shows deteriorated rotator cuff L , as well as arthritic changes L shoulder  PATIENT SURVEYS:  Quick Dash 84% disability  COGNITION: Overall cognitive status: Within functional limits for tasks assessed     SENSATION: WFL  POSTURE: Guarded posture, l shoulder protracted and depressed compared to R with loss of deltoid musculature  UPPER EXTREMITY ROM: L hand, wrist, forearm, elbow all wnl AROM although strained for full L elbow flexion movement.   Patient did not tolerate L shoulder PROM, noted able to elevate to 85 degrees scaption for donning,doffing shirt.  Patient unable to attempt other movements such as reach behind back or hand behind neck/upper back.  UPPER EXTREMITY MMT: L hand grasp 4/5, all other L UE MMT not performed due to inflammatory nature of pain L UE  JOINT MOBILITY TESTING:  Unable to assess  PALPATION:  Hypersensitive, exquisite tenderness L shoulder jt, upper arm   TODAY'S TREATMENT:                                                                                                                                         DATE:  06/1923 AAROM Flex, Ext, IR up back w/ cane Scapular retractions 2x10 Biceps Curls 1lb x10 Isometrics Flex, ext, abd, Internal and external rotation  Gentle LUE PROM    06/13/23 Attempted some light massage and PROM but unable to get much as she is very sensitive  Table slides flexion and abd 2x10 very minimal movement  MH and IFC x12 mins to L shoulder   06/12/23:  Eval, attempted light low grade isometrics for flex, ext L shoulder with increased pain noted by pt Iontophoresis, applied  ibresis patch with 0.04% dexamethasome L ant shoulder over superior biceps. Advised to leave patch in place for additional medication absorption, for another 3 1/2 hrs post this appt. Also kinesiotaping, 2- I pieces applied, one lat upper arm to upper traps, and one ant upper arm to upper traps, utilized to reduce strain rotator cuff L   PATIENT EDUCATION: Education details: POC, goals Person educated: Patient and Child(ren) Education method: Explanation, Demonstration, Tactile cues, and Verbal cues Education comprehension: verbalized understanding, verbal cues required, and tactile cues required  HOME EXERCISE PROGRAM: TBD  ASSESSMENT:  CLINICAL IMPRESSION: Patient is a 77 y.o. female who was seen for physical therapy treatment to address L shoulder pain/ weakness. She continues to have  high levels of pain and reports her shoulder is terrible.  Patient was able to complete all of today's interventions but with continued pain throughout session. Session consisted of  AAROM, isometrics and gentle PROM. Pt very guarded with PROM and had a difficult time relaxing. Pt denied all modalities post session.  OBJECTIVE IMPAIRMENTS: decreased ROM, decreased strength, and pain.   ACTIVITY LIMITATIONS: carrying, lifting, sleeping, bed mobility, bathing, toileting, dressing, reach over head, and hygiene/grooming  PARTICIPATION LIMITATIONS: meal prep, cleaning, laundry, driving, shopping, and community activity  PERSONAL FACTORS: Age, Behavior pattern, Past/current experiences, Time since onset of injury/illness/exacerbation, and 1-2 comorbidities: L shoulder rotator cuff repair prior,   are also affecting patient's functional outcome.   REHAB POTENTIAL: Fair    CLINICAL DECISION MAKING: Stable/uncomplicated  EVALUATION COMPLEXITY: Low   GOALS: Goals reviewed with patient? Yes  SHORT TERM GOALS: Target date: 2 weeks 06/26/23  I HEP Baseline: Goal status: INITIAL  LONG TERM GOALS: Target  date: 07/24/23  Improve ROM L shoulder for flex, abd, to 100 or greater, L shoulder ER 45 or greater Baseline: 85 degrees elevation, rotation not observed Goal status: INITIAL  2.  Improve quick DASh from 86% deficit to 50% or better Baseline:  Goal status: INITIAL  3.  Strength L shoulder biceps, flex, abd, to 3+/5 Baseline: 2+ to 3- Goal status: INITIAL    PLAN:  PT FREQUENCY: 1-2x/week  PT DURATION: 6 weeks  PLANNED INTERVENTIONS: Therapeutic exercises, Therapeutic activity, Neuromuscular re-education, Balance training, Gait training, Patient/Family education, Self Care, Joint mobilization, and Ionotophoresis 4mg /ml Dexamethasone  PLAN FOR NEXT SESSION: light movements as tolerated, work on bringing her pain levels down, did the e-stim help at all?   Grayce Sessions, PTA, DPT 06/19/2023, 8:06 AM

## 2023-06-21 ENCOUNTER — Encounter: Payer: Self-pay | Admitting: Physical Therapy

## 2023-06-21 ENCOUNTER — Ambulatory Visit: Payer: Medicare HMO | Admitting: Physical Therapy

## 2023-06-21 DIAGNOSIS — G8929 Other chronic pain: Secondary | ICD-10-CM | POA: Diagnosis not present

## 2023-06-21 DIAGNOSIS — M6281 Muscle weakness (generalized): Secondary | ICD-10-CM | POA: Diagnosis not present

## 2023-06-21 DIAGNOSIS — M25612 Stiffness of left shoulder, not elsewhere classified: Secondary | ICD-10-CM

## 2023-06-21 DIAGNOSIS — M25512 Pain in left shoulder: Secondary | ICD-10-CM | POA: Diagnosis not present

## 2023-06-21 DIAGNOSIS — M19112 Post-traumatic osteoarthritis, left shoulder: Secondary | ICD-10-CM | POA: Diagnosis not present

## 2023-06-21 DIAGNOSIS — R6889 Other general symptoms and signs: Secondary | ICD-10-CM

## 2023-06-21 NOTE — Therapy (Signed)
OUTPATIENT PHYSICAL THERAPY SHOULDER TREATMENT   Patient Name: Colleen [name] MRN: [id number] DOB:Sep 05, 1946, 77 y.o., female Today's Date: 06/21/2023  END OF SESSION:  PT End of Session - 06/21/23 0928     Visit Number 4    PT Start Time 0930    PT Stop Time 1015    PT Time Calculation (min) 45 min    Activity Tolerance Patient limited by pain    Behavior During Therapy [facility] for tasks assessed/performed              Past Medical History:  Diagnosis Date   Allergic rhinitis    Anxiety    Arthritis    Arthritis of left wrist 10/13/2021   Atrophic vaginitis 06/25/2019   Biceps tendinitis 02/07/2022   Breast screening 10/26/2010   Last Assessment & Plan:  Formatting of this note might be different from the original. Will order a mammogram for this fall;  Follow up with me around Thanksgiving.   Callus of heel 02/19/2013   Cardiac murmur 10/09/2020   Colon polyps    adenomatous   Constipation 08/20/2013   Corns and callosities    Diverticulosis    DOE (dyspnea on exertion) 10/09/2020   Dysuria 06/03/2011   Last Assessment & Plan:  Formatting of this note might be different from the original. New, acute complaint today;  Will check urinalysis with her labs today;   Full thickness rotator cuff tear 02/07/2022   GERD (gastroesophageal reflux disease)    Glaucoma    H/O total hysterectomy 02/19/2013   Hallux valgus 10/26/2010   Last Assessment & Plan:  Formatting of this note might be different from the original. Had surgery with Dr Ernestine Conrad back in October;   Will have her follow up with him.   Heartburn 04/30/2014   Helicobacter pylori gastritis 08/12/2014   Hematoma of lower leg 06/12/2018   Hiatal hernia with gastroesophageal reflux 12/23/2014   Hypercholesteremia    no meds   Hypercholesterolemia 06/03/2011   Overview:  TC 205 (elevated) in 2010  Last Assessment & Plan:  Formatting of this note might be different from the original. Lab Results  Component Value Date    CHOL 205 12/09/2011   CHOL 233 06/03/2011   CHOL 205 05/26/2009   Lab Results  Component Value Date   HDL 82 12/09/2011   HDL 90 06/03/2011   HDL 75 05/26/2009   Lab Results  Component Value Date   LDLCALC 113 12/09/2011   LDLCALC 133 06/03/2011   LDLCA   Hypothyroidism    Left-sided low back pain with left-sided sciatica 08/12/2014   Neck pain 10/26/2010   Obesity    Osteoarthritis 10/26/2010   Osteoarthritis of acromioclavicular joint 02/07/2022   Osteopenia 01/08/2014   Overweight 10/26/2010   Postmenopausal estrogen deficiency 02/19/2013   Recurrent UTI 06/25/2019   Restless leg syndrome 08/20/2013   Routine general medical examination at a health care facility 02/19/2013   S/P carpal tunnel release 10/26/2010   Screen for colon cancer 12/16/2011   Last Assessment & Plan:  Formatting of this note might be different from the original. Refer to GI;   Screening for diabetes mellitus    Shoulder pain, right 12/16/2011   Last Assessment & Plan:  Formatting of this note might be different from the original. We have tried physical therapy;   We shot and reviewed films today;  She has  1)  Osteopenia 2)  Small spur at humeral head 3)  Some hypertrophic/lipping changes at the  inferior glenoid rim;   I also injected it last visit;  She is on NSAIDS and tylenol;  Will trial voltaren gel;  Then get ortho opinion;   Skin spots-aging 07/08/2014   Strain of neck muscle 06/12/2018   Urge incontinence    Whiplash injury 06/26/2018   Past Surgical History:  Procedure Laterality Date   ABDOMINAL HYSTERECTOMY  1985   Dr Kathyrn Drown Right    Dr Ernestine Conrad   CARPAL TUNNEL RELEASE Bilateral 2010   Dr Christell Constant   CATARACT EXTRACTION Bilateral    CESAREAN SECTION  1983   1 time   COLONOSCOPY     ROTATOR CUFF REPAIR Left 03/10/2022   Dr Thomasena Edis   THYROIDECTOMY  1987   Dr Raechel Ache   TONSILLECTOMY  1951   Patient Active Problem List   Diagnosis Date Noted   Biceps tendinitis 02/07/2022   Full thickness  rotator cuff tear 02/07/2022   Cardiac murmur 10/09/2020   Allergic rhinitis    Anxiety    Arthritis    Corns and callosities    Diverticulosis    Glaucoma    Hypercholesteremia    Obesity    Urge incontinence    Atrophic vaginitis 06/25/2019   Whiplash injury 06/26/2018   Strain of neck muscle 06/12/2018   Hiatal hernia with gastroesophageal reflux 12/23/2014   Left-sided low back pain with left-sided sciatica 08/12/2014   Osteopenia 01/08/2014   Restless leg syndrome 08/20/2013   Constipation 08/20/2013   Postmenopausal estrogen deficiency 02/19/2013   Hypothyroidism 10/26/2010   Osteoarthritis 10/26/2010   S/P carpal tunnel release 10/26/2010   Hallux valgus 10/26/2010    PCP: Abbey Chatters, NP  REFERRING PROVIDER: Beverely Low, MD  REFERRING DIAG: L shoulder rotator cuff syndrome, pain  THERAPY DIAG:  Chronic left shoulder pain  Decreased functional activity tolerance  Muscle weakness of left arm  Stiffness of left shoulder, not elsewhere classified  Rationale for Evaluation and Treatment: Rehabilitation  ONSET DATE: MAY 2023  SUBJECTIVE:                                                                                                                                                                                      SUBJECTIVE STATEMENT: "The same, as far as my shoulder"  Hand dominance: Right  PERTINENT HISTORY: May 2023 had L shoulder rotator cuff repair.   PAIN:  Are you having pain? Yes: NPRS scale: 10/10 Pain location: L shoulder upper arm upper back Pain description: constant aching, skin is tender Aggravating factors: movement, palpation, sleeping Relieving factors: using nursing pillow for sleeping, taking tramadol with little effect  PRECAUTIONS: None  RED FLAGS: None  WEIGHT BEARING RESTRICTIONS: No  FALLS:  Has patient fallen in last 6 months? No  LIVING ENVIRONMENT: Lives with: lives with their family Lives in:  House/apartment Stairs: Yes: External:   steps; on right going up Has following equipment at home: None  OCCUPATION: retired  PLOF: Independent with basic ADLs  PATIENT GOALS:need relief from this pain  NEXT MD VISIT:   OBJECTIVE:   DIAGNOSTIC FINDINGS:  Actual MRI not available, but daughter and pt report MRI shows deteriorated rotator cuff L , as well as arthritic changes L shoulder  PATIENT SURVEYS:  Quick Dash 84% disability  COGNITION: Overall cognitive status: Within functional limits for tasks assessed     SENSATION: WFL  POSTURE: Guarded posture, l shoulder protracted and depressed compared to R with loss of deltoid musculature  UPPER EXTREMITY ROM: L hand, wrist, forearm, elbow all wnl AROM although strained for full L elbow flexion movement.   Patient did not tolerate L shoulder PROM, noted able to elevate to 85 degrees scaption for donning,doffing shirt.  Patient unable to attempt other movements such as reach behind back or hand behind neck/upper back.  UPPER EXTREMITY MMT: L hand grasp 4/5, all other L UE MMT not performed due to inflammatory nature of pain L UE  JOINT MOBILITY TESTING:  Unable to assess  PALPATION:  Hypersensitive, exquisite tenderness L shoulder jt, upper arm   TODAY'S TREATMENT:                                                                                                                                         DATE:  06/21/23 AAROM Flex, Ext, IR up back w/ cane Isometrics Flex, ext, abd, Internal and external rotation  Biceps Curls 1lb 2x10 Gentle LUE PROM   06/1923 AAROM Flex, Ext, IR up back w/ cane Scapular retractions 2x10 Biceps Curls 1lb x10 Isometrics Flex, ext, abd, Internal and external rotation  Gentle LUE PROM    06/13/23 Attempted some light massage and PROM but unable to get much as she is very sensitive  Table slides flexion and abd 2x10 very minimal movement  MH and IFC x12 mins to L shoulder   06/12/23:   Eval, attempted light low grade isometrics for flex, ext L shoulder with increased pain noted by pt Iontophoresis, applied ibresis patch with 0.04% dexamethasome L ant shoulder over superior biceps. Advised to leave patch in place for additional medication absorption, for another 3 1/2 hrs post this appt. Also kinesiotaping, 2- I pieces applied, one lat upper arm to upper traps, and one ant upper arm to upper traps, utilized to reduce strain rotator cuff L   PATIENT EDUCATION: Education details: POC, goals Person educated: Patient and Child(ren) Education method: Explanation, Demonstration, Tactile cues, and Verbal cues Education comprehension: verbalized understanding, verbal cues required, and tactile cues required  HOME EXERCISE PROGRAM: TBD  ASSESSMENT:  CLINICAL IMPRESSION: Patient is a 77  y.o. female who was seen for physical therapy treatment to address L shoulder pain/ weakness. She continues to have high levels without change. Patient was able to complete all of today's interventions but with continued pain throughout session. Again session consisted of  AAROM, isometrics and gentle PROM. Pt very guarded with PROM and had a difficult time relaxing due to increase pain levels. No progress towards goals.   OBJECTIVE IMPAIRMENTS: decreased ROM, decreased strength, and pain.   ACTIVITY LIMITATIONS: carrying, lifting, sleeping, bed mobility, bathing, toileting, dressing, reach over head, and hygiene/grooming  PARTICIPATION LIMITATIONS: meal prep, cleaning, laundry, driving, shopping, and community activity  PERSONAL FACTORS: Age, Behavior pattern, Past/current experiences, Time since onset of injury/illness/exacerbation, and 1-2 comorbidities: L shoulder rotator cuff repair prior,   are also affecting patient's functional outcome.   REHAB POTENTIAL: Fair    CLINICAL DECISION MAKING: Stable/uncomplicated  EVALUATION COMPLEXITY: Low   GOALS: Goals reviewed with patient?  Yes  SHORT TERM GOALS: Target date: 2 weeks 06/26/23  I HEP Baseline: Goal status: INITIAL  LONG TERM GOALS: Target date: 07/24/23  Improve ROM L shoulder for flex, abd, to 100 or greater, L shoulder ER 45 or greater Baseline: 85 degrees elevation, rotation not observed Goal status: No progress 8/21  2.  Improve quick DASh from 86% deficit to 50% or better Baseline:  Goal status: INITIAL  3.  Strength L shoulder biceps, flex, abd, to 3+/5 Baseline: 2+ to 3- Goal status: No progress 8/21    PLAN:  PT FREQUENCY: 1-2x/week  PT DURATION: 6 weeks  PLANNED INTERVENTIONS: Therapeutic exercises, Therapeutic activity, Neuromuscular re-education, Balance training, Gait training, Patient/Family education, Self Care, Joint mobilization, and Ionotophoresis 4mg /ml Dexamethasone  PLAN FOR NEXT SESSION: light movements as tolerated, work on bringing her pain levels down, did the e-stim help at all?   Grayce Sessions, PTA, DPT 06/21/2023, 9:28 AM

## 2023-06-22 ENCOUNTER — Other Ambulatory Visit: Payer: Self-pay | Admitting: Nurse Practitioner

## 2023-06-22 ENCOUNTER — Encounter: Payer: Self-pay | Admitting: Orthopedic Surgery

## 2023-06-22 ENCOUNTER — Ambulatory Visit (INDEPENDENT_AMBULATORY_CARE_PROVIDER_SITE_OTHER): Payer: Medicare HMO | Admitting: Orthopedic Surgery

## 2023-06-22 VITALS — BP 128/60 | HR 76 | Temp 98.0°F | Resp 17 | Ht 61.0 in | Wt 163.7 lb

## 2023-06-22 DIAGNOSIS — Z01818 Encounter for other preprocedural examination: Secondary | ICD-10-CM | POA: Diagnosis not present

## 2023-06-22 DIAGNOSIS — R739 Hyperglycemia, unspecified: Secondary | ICD-10-CM

## 2023-06-22 DIAGNOSIS — M25512 Pain in left shoulder: Secondary | ICD-10-CM | POA: Diagnosis not present

## 2023-06-22 DIAGNOSIS — I10 Essential (primary) hypertension: Secondary | ICD-10-CM

## 2023-06-22 DIAGNOSIS — G8929 Other chronic pain: Secondary | ICD-10-CM

## 2023-06-22 NOTE — Progress Notes (Addendum)
[name]: Patient Care Team: Sharon Seller, NP as PCP - General (Nurse Practitioner)  Seen by: Hazle Nordmann, AGNP-C  PLACE OF SERVICE:  [facility]  Advanced Directive information Does Patient Have a Medical Advance Directive?: Yes, Type of Advance Directive: Healthcare Power of [place];Living will, Does patient want to make changes to medical advance directive?: No - Patient declined  No Known Allergies  Chief Complaint  Patient presents with   Medical Clearance    Surgical Clearance.      HPI: Patient is a 77 y.o. female seen today for preoperative evaluation.   Followed by Dr. Ranell Patrick for left shoulder pain. H/o left rotator cuff repair. She has had increased pain > 3 months. She has been going to PT without relief. She is taking Norco for pain, averaging 2 tablets daily. Also using voltaren gel.  Her ADLs are limited by left shoulder pain. She is unable to wash her hair or bath due to pain. She cannot complete chores due to pain. Afebrile. Vitals stable.   Review of Systems:  Review of Systems  Constitutional:  Negative for fever and malaise/fatigue.  HENT: Negative.    Eyes: Negative.   Respiratory:  Negative for cough, shortness of breath and wheezing.   Cardiovascular:  Positive for leg swelling. Negative for chest pain.  Gastrointestinal: Negative.   Genitourinary: Negative.   Musculoskeletal:  Positive for joint pain. Negative for falls.  Skin: Negative.   Neurological:  Negative for dizziness and headaches.  Psychiatric/Behavioral:  Negative for depression. The patient is not nervous/anxious.     Past Medical History:  Diagnosis Date   Allergic rhinitis    Anxiety    Arthritis    Arthritis of left wrist 10/13/2021   Atrophic vaginitis 06/25/2019   Biceps tendinitis 02/07/2022   Breast screening 10/26/2010   Last Assessment & Plan:  Formatting of this note might be different from the original. Will order a mammogram for this fall;  Follow up with me  around Thanksgiving.   Callus of heel 02/19/2013   Cardiac murmur 10/09/2020   Colon polyps    adenomatous   Constipation 08/20/2013   Corns and callosities    Diverticulosis    DOE (dyspnea on exertion) 10/09/2020   Dysuria 06/03/2011   Last Assessment & Plan:  Formatting of this note might be different from the original. New, acute complaint today;  Will check urinalysis with her labs today;   Full thickness rotator cuff tear 02/07/2022   GERD (gastroesophageal reflux disease)    Glaucoma    H/O total hysterectomy 02/19/2013   Hallux valgus 10/26/2010   Last Assessment & Plan:  Formatting of this note might be different from the original. Had surgery with Dr Ernestine Conrad back in October;   Will have her follow up with him.   Heartburn 04/30/2014   Helicobacter pylori gastritis 08/12/2014   Hematoma of lower leg 06/12/2018   Hiatal hernia with gastroesophageal reflux 12/23/2014   Hypercholesteremia    no meds   Hypercholesterolemia 06/03/2011   Overview:  TC 205 (elevated) in 2010  Last Assessment & Plan:  Formatting of this note might be different from the original. Lab Results  Component Value Date   CHOL 205 12/09/2011   CHOL 233 06/03/2011   CHOL 205 05/26/2009   Lab Results  Component Value Date   HDL 82 12/09/2011   HDL 90 06/03/2011   HDL 75 05/26/2009   Lab Results  Component Value Date   LDLCALC 113 12/09/2011  LDLCALC 133 06/03/2011   LDLCA   Hypothyroidism    Left-sided low back pain with left-sided sciatica 08/12/2014   Neck pain 10/26/2010   Obesity    Osteoarthritis 10/26/2010   Osteoarthritis of acromioclavicular joint 02/07/2022   Osteopenia 01/08/2014   Overweight 10/26/2010   Postmenopausal estrogen deficiency 02/19/2013   Recurrent UTI 06/25/2019   Restless leg syndrome 08/20/2013   Routine general medical examination at a health care facility 02/19/2013   S/P carpal tunnel release 10/26/2010   Screen for colon cancer 12/16/2011   Last Assessment & Plan:  Formatting of this note  might be different from the original. Refer to GI;   Screening for diabetes mellitus    Shoulder pain, right 12/16/2011   Last Assessment & Plan:  Formatting of this note might be different from the original. We have tried physical therapy;   We shot and reviewed films today;  She has  1)  Osteopenia 2)  Small spur at humeral head 3)  Some hypertrophic/lipping changes at the inferior glenoid rim;   I also injected it last visit;  She is on NSAIDS and tylenol;  Will trial voltaren gel;  Then get ortho opinion;   Skin spots-aging 07/08/2014   Strain of neck muscle 06/12/2018   Urge incontinence    Whiplash injury 06/26/2018   Past Surgical History:  Procedure Laterality Date   ABDOMINAL HYSTERECTOMY  1985   Dr Kathyrn Drown Right    Dr Ernestine Conrad   CARPAL TUNNEL RELEASE Bilateral 2010   Dr Christell Constant   CATARACT EXTRACTION Bilateral    CESAREAN SECTION  1983   1 time   COLONOSCOPY     ROTATOR CUFF REPAIR Left 03/10/2022   Dr Thomasena Edis   THYROIDECTOMY  1987   Dr Raechel Ache   TONSILLECTOMY  1951   Social History:   reports that she quit smoking about 36 years ago. Her smoking use included cigarettes. She has never used smokeless tobacco. She reports that she does not drink alcohol and does not use drugs.  Family History  Problem Relation Age of Onset   Leukemia Mother 74   Lung cancer Father    Diabetes Brother    COPD Brother    Lung cancer Brother    Diabetes Maternal Grandmother    Cancer Maternal Grandfather        unknown type   Breast cancer Maternal Aunt    Colon polyps Neg Hx    Rectal cancer Neg Hx     Medications: Patient's Medications  New Prescriptions   No medications on file  Previous Medications   ASCORBIC ACID (VITAMIN C PO)    Take 1 tablet by mouth daily.   ASPIRIN EC 81 MG TABLET    Take 81 mg by mouth every other day. Swallow whole.   ATORVASTATIN (LIPITOR) 10 MG TABLET    TAKE 0.5 TABLETS (5 MG TOTAL) BY MOUTH DAILY. E78.00   CALCIUM CARBONATE (OS-CAL)  600 MG TABS TABLET    Take 600 mg by mouth daily.   CHOLECALCIFEROL 50 MCG (2000 UT) TABS    Take 1 tablet by mouth daily.   CRANBERRY PO    Take 1 tablet by mouth daily.   DENOSUMAB (PROLIA) 60 MG/ML SOSY INJECTION    Inject 60 mg into the skin every 6 (six) months.   DICLOFENAC SODIUM (VOLTAREN) 1 % GEL    APPLY 2 GRAMS TOPICALLY 4 (FOUR) TIMES DAILY AS NEEDED.   HYDROCHLOROTHIAZIDE (HYDRODIURIL) 12.5 MG TABLET  Take 0.5 tablets (6.25 mg total) by mouth daily.   HYDROCODONE-ACETAMINOPHEN (NORCO/VICODIN) 5-325 MG TABLET    Take 1 tablet by mouth every 8 (eight) hours as needed.   HYDROXYPROPYL METHYLCELLULOSE / HYPROMELLOSE (ISOPTO TEARS / GONIOVISC) 2.5 % OPHTHALMIC SOLUTION    Place 1 drop into both eyes as needed for dry eyes.    LEVOTHYROXINE (SYNTHROID) 25 MCG TABLET    TAKE 1 TABLET BY MOUTH EVERY DAY BEFORE BREAKFAST   LYRICA 50 MG CAPSULE    Take 50 mg by mouth 3 (three) times daily.   MULTIPLE VITAMIN (MULTIVITAMIN WITH MINERALS) TABS    Take 1 tablet by mouth daily.  Modified Medications   No medications on file  Discontinued Medications   BUTRANS 15 MCG/HR    Place 1 patch onto the skin once a week.   METHYLCELLULOSE, LAXATIVE, (CITRUCEL) 500 MG TABS    Take 1,000 mg by mouth 2 (two) times daily.   OMEPRAZOLE (PRILOSEC) 40 MG CAPSULE    Take 1 capsule (40 mg total) by mouth daily.   PRUCALOPRIDE SUCCINATE (MOTEGRITY) 2 MG TABS    Take 1 tablet (2 mg total) by mouth daily.    Physical Exam:  Vitals:   06/22/23 1245  BP: 128/60  Pulse: 76  Resp: 17  Temp: 98 F (36.7 C)  SpO2: 96%  Weight: 163 lb 11.2 oz (74.3 kg)  Height: 5\' 1"  (1.549 m)   Body mass index is 30.93 kg/m. Wt Readings from Last 3 Encounters:  06/22/23 163 lb 11.2 oz (74.3 kg)  02/24/23 164 lb 6.4 oz (74.6 kg)  12/13/22 159 lb 7 oz (72.3 kg)    Physical Exam Vitals reviewed.  Constitutional:      General: She is not in acute distress. HENT:     Head: Normocephalic.  Eyes:     General:         Right eye: No discharge.        Left eye: No discharge.  Neck:     Vascular: No carotid bruit.  Cardiovascular:     Rate and Rhythm: Normal rate and regular rhythm.     Pulses: Normal pulses.     Heart sounds: Normal heart sounds.  Pulmonary:     Effort: Pulmonary effort is normal. No respiratory distress.     Breath sounds: Normal breath sounds. No wheezing or rales.  Abdominal:     General: Bowel sounds are normal.     Palpations: Abdomen is soft.  Musculoskeletal:     Cervical back: Neck supple.     Right lower leg: No edema.     Left lower leg: No edema.  Lymphadenopathy:     Cervical: No cervical adenopathy.  Skin:    General: Skin is warm.     Capillary Refill: Capillary refill takes less than 2 seconds.  Neurological:     General: No focal deficit present.     Mental Status: She is alert and oriented to person, place, and time.  Psychiatric:        Mood and Affect: Mood normal.     Labs reviewed: Basic Metabolic Panel: Recent Labs    08/22/22 1040 02/24/23 1016  NA 144 143  K 4.2 4.0  CL 107 106  CO2 26 29  GLUCOSE 89 129*  BUN 23 19  CREATININE 1.01* 1.14*  CALCIUM 9.8 9.2  TSH  --  0.67   Liver Function Tests: Recent Labs    08/22/22 1040 02/24/23 1016  AST 30  30  ALT 13 15  BILITOT 0.5 0.5  PROT 6.8 6.5   No results for input(s): "LIPASE", "AMYLASE" in the last 8760 hours. No results for input(s): "AMMONIA" in the last 8760 hours. CBC: Recent Labs    08/22/22 1040 02/24/23 1016  WBC 3.4* 3.5*  NEUTROABS 1,190* 1,334*  HGB 11.6* 11.4*  HCT 36.5 36.1  MCV 95.3 93.8  PLT 301 213   Lipid Panel: Recent Labs    02/24/23 1016  CHOL 182  HDL 81  LDLCALC 85  TRIG 75  CHOLHDL 2.2   TSH: Recent Labs    02/24/23 1016  TSH 0.67   A1C: Lab Results  Component Value Date   HGBA1C 5.6 07/05/2014     Assessment/Plan 1. Chronic left shoulder pain - h/o left rotator cuff repair - followed by [facility] - ADLs limited due to  left shoulder pain decreased ROM - cont norco and voltaren gel for pain  2. Preoperative examination - EKG> NSR - CBC with Differential/Platelet> pending - Basic Metabolic Panel with eGFR> pending - Hemoglobin A1c> pending  3. Essential (primary) hypertension - controlled - cont hydrochlorothiazide  4. Hyperglycemia - glucose 129 02/24/2023 - A1c   Total time: 32 minutes. Greater than 50% of total time spent doing patient education regarding preoperative clearance and left shoulder pain including symptom/medication management.    Next appt: Visit date not found  Cleve Paolillo Scherry Ran  [facility] [phone]

## 2023-06-22 NOTE — Patient Instructions (Addendum)
Will complete preoperative clearance paperwork once lab results have returned  Please hold aspirin and voltaren gel 7 days prior to surgery  Continue norco and voltaren gel for left shoulder pain

## 2023-06-23 LAB — BASIC METABOLIC PANEL WITH GFR
BUN/Creatinine Ratio: 18 (calc) (ref 6–22)
BUN: 18 mg/dL (ref 7–25)
CO2: 31 mmol/L (ref 20–32)
Calcium: 9.7 mg/dL (ref 8.6–10.4)
Chloride: 107 mmol/L (ref 98–110)
Creat: 1.01 mg/dL — ABNORMAL HIGH (ref 0.60–1.00)
Glucose, Bld: 93 mg/dL (ref 65–99)
Potassium: 5.2 mmol/L (ref 3.5–5.3)
Sodium: 143 mmol/L (ref 135–146)
eGFR: 57 mL/min/{1.73_m2} — ABNORMAL LOW (ref >=60)

## 2023-06-23 LAB — CBC WITH DIFFERENTIAL/PLATELET
Absolute Monocytes: 292 {cells}/uL (ref 200–950)
Basophils Absolute: 30 {cells}/uL (ref 0–200)
Basophils Relative: 0.7 %
Eosinophils Absolute: 22 {cells}/uL (ref 15–500)
Eosinophils Relative: 0.5 %
HCT: 35.6 % (ref 35.0–45.0)
Hemoglobin: 11.4 g/dL — ABNORMAL LOW (ref 11.7–15.5)
Lymphs Abs: 2073 {cells}/uL (ref 850–3900)
MCH: 30.6 pg (ref 27.0–33.0)
MCHC: 32 g/dL (ref 32.0–36.0)
MCV: 95.4 fL (ref 80.0–100.0)
MPV: 12 fL (ref 7.5–12.5)
Monocytes Relative: 6.8 %
Neutro Abs: 1883 {cells}/uL (ref 1500–7800)
Neutrophils Relative %: 43.8 %
Platelets: 313 10*3/uL (ref 140–400)
RBC: 3.73 10*6/uL — ABNORMAL LOW (ref 3.80–5.10)
RDW: 12.2 % (ref 11.0–15.0)
Total Lymphocyte: 48.2 %
WBC: 4.3 10*3/uL (ref 3.8–10.8)

## 2023-06-23 LAB — HEMOGLOBIN A1C
Hgb A1c MFr Bld: 5.4 %{Hb} (ref <5.7)
Mean Plasma Glucose: 108 mg/dL
eAG (mmol/L): 6 mmol/L

## 2023-06-26 ENCOUNTER — Other Ambulatory Visit: Payer: Self-pay

## 2023-06-26 ENCOUNTER — Other Ambulatory Visit: Payer: Self-pay | Admitting: Nurse Practitioner

## 2023-06-26 ENCOUNTER — Ambulatory Visit: Payer: Medicare HMO

## 2023-06-26 DIAGNOSIS — M25612 Stiffness of left shoulder, not elsewhere classified: Secondary | ICD-10-CM

## 2023-06-26 DIAGNOSIS — R6889 Other general symptoms and signs: Secondary | ICD-10-CM | POA: Diagnosis not present

## 2023-06-26 DIAGNOSIS — E7800 Pure hypercholesterolemia, unspecified: Secondary | ICD-10-CM

## 2023-06-26 DIAGNOSIS — M19112 Post-traumatic osteoarthritis, left shoulder: Secondary | ICD-10-CM | POA: Diagnosis not present

## 2023-06-26 DIAGNOSIS — G8929 Other chronic pain: Secondary | ICD-10-CM

## 2023-06-26 DIAGNOSIS — M25512 Pain in left shoulder: Secondary | ICD-10-CM | POA: Diagnosis not present

## 2023-06-26 DIAGNOSIS — M6281 Muscle weakness (generalized): Secondary | ICD-10-CM

## 2023-06-26 NOTE — Therapy (Signed)
OUTPATIENT PHYSICAL THERAPY SHOULDER TREATMENT   Patient Name: Mickey Auchter MRN: [id number] DOB:08-23-1946, 77 y.o., female Today's Date: 06/26/2023  END OF SESSION:  PT End of Session - 06/26/23 0936     Visit Number 5    Date for PT Re-Evaluation 07/24/23    Progress Note Due on Visit 10    PT Start Time 0930    PT Stop Time 1003    PT Time Calculation (min) 33 min    Activity Tolerance Patient limited by pain    Behavior During Therapy [facility] for tasks assessed/performed               Past Medical History:  Diagnosis Date   Allergic rhinitis    Anxiety    Arthritis    Arthritis of left wrist 10/13/2021   Atrophic vaginitis 06/25/2019   Biceps tendinitis 02/07/2022   Breast screening 10/26/2010   Last Assessment & Plan:  Formatting of this note might be different from the original. Will order a mammogram for this fall;  Follow up with me around Thanksgiving.   Callus of heel 02/19/2013   Cardiac murmur 10/09/2020   Colon polyps    adenomatous   Constipation 08/20/2013   Corns and callosities    Diverticulosis    DOE (dyspnea on exertion) 10/09/2020   Dysuria 06/03/2011   Last Assessment & Plan:  Formatting of this note might be different from the original. New, acute complaint today;  Will check urinalysis with her labs today;   Full thickness rotator cuff tear 02/07/2022   GERD (gastroesophageal reflux disease)    Glaucoma    H/O total hysterectomy 02/19/2013   Hallux valgus 10/26/2010   Last Assessment & Plan:  Formatting of this note might be different from the original. Had surgery with Dr Ernestine Conrad back in October;   Will have her follow up with him.   Heartburn 04/30/2014   Helicobacter pylori gastritis 08/12/2014   Hematoma of lower leg 06/12/2018   Hiatal hernia with gastroesophageal reflux 12/23/2014   Hypercholesteremia    no meds   Hypercholesterolemia 06/03/2011   Overview:  TC 205 (elevated) in 2010  Last Assessment & Plan:  Formatting of this note  might be different from the original. Lab Results  Component Value Date   CHOL 205 12/09/2011   CHOL 233 06/03/2011   CHOL 205 05/26/2009   Lab Results  Component Value Date   HDL 82 12/09/2011   HDL 90 06/03/2011   HDL 75 05/26/2009   Lab Results  Component Value Date   LDLCALC 113 12/09/2011   LDLCALC 133 06/03/2011   LDLCA   Hypothyroidism    Left-sided low back pain with left-sided sciatica 08/12/2014   Neck pain 10/26/2010   Obesity    Osteoarthritis 10/26/2010   Osteoarthritis of acromioclavicular joint 02/07/2022   Osteopenia 01/08/2014   Overweight 10/26/2010   Postmenopausal estrogen deficiency 02/19/2013   Recurrent UTI 06/25/2019   Restless leg syndrome 08/20/2013   Routine general medical examination at a health care facility 02/19/2013   S/P carpal tunnel release 10/26/2010   Screen for colon cancer 12/16/2011   Last Assessment & Plan:  Formatting of this note might be different from the original. Refer to GI;   Screening for diabetes mellitus    Shoulder pain, right 12/16/2011   Last Assessment & Plan:  Formatting of this note might be different from the original. We have tried physical therapy;   We shot and reviewed films today;  She has  1)  Osteopenia 2)  Small spur at humeral head 3)  Some hypertrophic/lipping changes at the inferior glenoid rim;   I also injected it last visit;  She is on NSAIDS and tylenol;  Will trial voltaren gel;  Then get ortho opinion;   Skin spots-aging 07/08/2014   Strain of neck muscle 06/12/2018   Urge incontinence    Whiplash injury 06/26/2018   Past Surgical History:  Procedure Laterality Date   ABDOMINAL HYSTERECTOMY  1985   Dr Kathyrn Drown Right    Dr Ernestine Conrad   CARPAL TUNNEL RELEASE Bilateral 2010   Dr Christell Constant   CATARACT EXTRACTION Bilateral    CESAREAN SECTION  1983   1 time   COLONOSCOPY     ROTATOR CUFF REPAIR Left 03/10/2022   Dr Thomasena Edis   THYROIDECTOMY  1987   Dr Raechel Ache   TONSILLECTOMY  1951   Patient Active Problem List    Diagnosis Date Noted   Biceps tendinitis 02/07/2022   Full thickness rotator cuff tear 02/07/2022   Cardiac murmur 10/09/2020   Allergic rhinitis    Anxiety    Arthritis    Corns and callosities    Diverticulosis    Glaucoma    Hypercholesteremia    Obesity    Urge incontinence    Atrophic vaginitis 06/25/2019   Whiplash injury 06/26/2018   Strain of neck muscle 06/12/2018   Hiatal hernia with gastroesophageal reflux 12/23/2014   Left-sided low back pain with left-sided sciatica 08/12/2014   Osteopenia 01/08/2014   Restless leg syndrome 08/20/2013   Constipation 08/20/2013   Postmenopausal estrogen deficiency 02/19/2013   Hypothyroidism 10/26/2010   Osteoarthritis 10/26/2010   S/P carpal tunnel release 10/26/2010   Hallux valgus 10/26/2010    PCP: Abbey Chatters, NP  REFERRING PROVIDER: Beverely Low, MD  REFERRING DIAG: L shoulder rotator cuff syndrome, pain  THERAPY DIAG:  Chronic left shoulder pain  Decreased functional activity tolerance  Muscle weakness of left arm  Stiffness of left shoulder, not elsewhere classified  Rationale for Evaluation and Treatment: Rehabilitation  ONSET DATE: MAY 2023  SUBJECTIVE:                                                                                                                                                                                      SUBJECTIVE STATEMENT: "I hurt, it shoots up my arm if I just make a fist with my L hand.  I have a lot of trouble sleeping too  Hand dominance: Right  PERTINENT HISTORY: May 2023 had L shoulder rotator cuff repair.   PAIN:  Are you having pain? Yes: NPRS scale: 10/10 Pain location: L shoulder  upper arm upper back Pain description: constant aching, skin is tender Aggravating factors: movement, palpation, sleeping Relieving factors: using nursing pillow for sleeping, taking tramadol with little effect  PRECAUTIONS: None  RED FLAGS: None   WEIGHT BEARING  RESTRICTIONS: No  FALLS:  Has patient fallen in last 6 months? No  LIVING ENVIRONMENT: Lives with: lives with their family Lives in: House/apartment Stairs: Yes: External:   steps; on right going up Has following equipment at home: None  OCCUPATION: retired  PLOF: Independent with basic ADLs  PATIENT GOALS:need relief from this pain  NEXT MD VISIT:   OBJECTIVE:   DIAGNOSTIC FINDINGS:  Actual MRI not available, but daughter and pt report MRI shows deteriorated rotator cuff L , as well as arthritic changes L shoulder  PATIENT SURVEYS:  Quick Dash 84% disability  COGNITION: Overall cognitive status: Within functional limits for tasks assessed     SENSATION: WFL  POSTURE: Guarded posture, l shoulder protracted and depressed compared to R with loss of deltoid musculature  UPPER EXTREMITY ROM: L hand, wrist, forearm, elbow all wnl AROM although strained for full L elbow flexion movement.   Patient did not tolerate L shoulder PROM, noted able to elevate to 85 degrees scaption for donning,doffing shirt.  Patient unable to attempt other movements such as reach behind back or hand behind neck/upper back.  UPPER EXTREMITY MMT: L hand grasp 4/5, all other L UE MMT not performed due to inflammatory nature of pain L UE  JOINT MOBILITY TESTING:  Unable to assess  PALPATION:  Hypersensitive, exquisite tenderness L shoulder jt, upper arm   TODAY'S TREATMENT:                                                                                                                                         DATE:  06/26/23: therex:  Supine for moist heat L shoulder, while therapist provided gentle resistance for isometric L shoulder IR, ER, biceps, triceps, 5 sec holds, 10 x each Supine for gentle PROM L shoulder all planes, pt tolerated 20 degrees flex, abd, scaption, did not tolerate any ER Seated for manually resisted isometric L shoulder ext 5 sec holds, 10 x Bicep curls, 10 reps, no  wt Seated L hand on her thigh for pendulum motions cw, ccw, flex/ext  06/21/23 AAROM Flex, Ext, IR up back w/ cane Isometrics Flex, ext, abd, Internal and external rotation  Biceps Curls 1lb 2x10 Gentle LUE PROM   06/1923 AAROM Flex, Ext, IR up back w/ cane Scapular retractions 2x10 Biceps Curls 1lb x10 Isometrics Flex, ext, abd, Internal and external rotation  Gentle LUE PROM    06/13/23 Attempted some light massage and PROM but unable to get much as she is very sensitive  Table slides flexion and abd 2x10 very minimal movement  MH and IFC x12 mins to L shoulder   06/12/23:  Eval, attempted light low grade isometrics  for flex, ext L shoulder with increased pain noted by pt Iontophoresis, applied ibresis patch with 0.04% dexamethasome L ant shoulder over superior biceps. Advised to leave patch in place for additional medication absorption, for another 3 1/2 hrs post this appt. Also kinesiotaping, 2- I pieces applied, one lat upper arm to upper traps, and one ant upper arm to upper traps, utilized to reduce strain rotator cuff L   PATIENT EDUCATION: Education details: POC, goals Person educated: Patient and Child(ren) Education method: Explanation, Demonstration, Tactile cues, and Verbal cues Education comprehension: verbalized understanding, verbal cues required, and tactile cues required  HOME EXERCISE PROGRAM: TBD  ASSESSMENT:  CLINICAL IMPRESSION: Patient is a 77 y.o. female who was seen for physical therapy treatment to address L shoulder pain/ weakness. She has marked pain, very guarded. Obvious muscle wasting L shoulder as compared to R.  Poor tolerance to all activities, audible crepitus/clunking L shoulder.  No progress towards goals. She is eager to schedule surgery for L TSA.  OBJECTIVE IMPAIRMENTS: decreased ROM, decreased strength, and pain.   ACTIVITY LIMITATIONS: carrying, lifting, sleeping, bed mobility, bathing, toileting, dressing, reach over head, and  hygiene/grooming  PARTICIPATION LIMITATIONS: meal prep, cleaning, laundry, driving, shopping, and community activity  PERSONAL FACTORS: Age, Behavior pattern, Past/current experiences, Time since onset of injury/illness/exacerbation, and 1-2 comorbidities: L shoulder rotator cuff repair prior,   are also affecting patient's functional outcome.   REHAB POTENTIAL: Fair    CLINICAL DECISION MAKING: Stable/uncomplicated  EVALUATION COMPLEXITY: Low   GOALS: Goals reviewed with patient? Yes  SHORT TERM GOALS: Target date: 2 weeks 06/26/23  I HEP Baseline: Goal status: INITIAL  LONG TERM GOALS: Target date: 07/24/23  Improve ROM L shoulder for flex, abd, to 100 or greater, L shoulder ER 45 or greater Baseline: 85 degrees elevation, rotation not observed Goal status: No progress 8/21  2.  Improve quick DASh from 86% deficit to 50% or better Baseline:  Goal status: INITIAL  3.  Strength L shoulder biceps, flex, abd, to 3+/5 Baseline: 2+ to 3- Goal status: No progress 8/21    PLAN:  PT FREQUENCY: 1-2x/week  PT DURATION: 6 weeks  PLANNED INTERVENTIONS: Therapeutic exercises, Therapeutic activity, Neuromuscular re-education, Balance training, Gait training, Patient/Family education, Self Care, Joint mobilization, and Ionotophoresis 4mg /ml Dexamethasone  PLAN FOR NEXT SESSION: light movements as tolerated, work on bringing her pain levels down, did the e-stim help at all?   Cuthbert Turton L [facility], PT, DPT, OCS 06/26/2023, 12:58 PM

## 2023-06-28 ENCOUNTER — Ambulatory Visit: Payer: Medicare HMO

## 2023-06-28 ENCOUNTER — Other Ambulatory Visit: Payer: Self-pay

## 2023-06-28 DIAGNOSIS — M25512 Pain in left shoulder: Secondary | ICD-10-CM | POA: Diagnosis not present

## 2023-06-28 DIAGNOSIS — M25612 Stiffness of left shoulder, not elsewhere classified: Secondary | ICD-10-CM | POA: Diagnosis not present

## 2023-06-28 DIAGNOSIS — R6889 Other general symptoms and signs: Secondary | ICD-10-CM

## 2023-06-28 DIAGNOSIS — M6281 Muscle weakness (generalized): Secondary | ICD-10-CM

## 2023-06-28 DIAGNOSIS — M19112 Post-traumatic osteoarthritis, left shoulder: Secondary | ICD-10-CM | POA: Diagnosis not present

## 2023-06-28 DIAGNOSIS — G8929 Other chronic pain: Secondary | ICD-10-CM

## 2023-06-28 NOTE — Therapy (Signed)
OUTPATIENT PHYSICAL THERAPY SHOULDER TREATMENT   Patient Name: Colleen [name] MRN: [id number] DOB:[birth year], 77 y.o., female Today's Date: 06/28/2023  END OF SESSION:  PT End of Session - 06/28/23 0803     Visit Number 6    Date for PT Re-Evaluation 07/24/23    Progress Note Due on Visit 10    PT Start Time 0800    PT Stop Time 0834    PT Time Calculation (min) 34 min    Activity Tolerance Patient limited by pain    Behavior During Therapy [facility] for tasks assessed/performed                Past Medical History:  Diagnosis Date   Allergic rhinitis    Anxiety    Arthritis    Arthritis of left wrist 10/13/2021   Atrophic vaginitis 06/25/2019   Biceps tendinitis 02/07/2022   Breast screening 10/26/2010   Last Assessment & Plan:  Formatting of this note might be different from the original. Will order a mammogram for this fall;  Follow up with me around Thanksgiving.   Callus of heel 02/19/2013   Cardiac murmur 10/09/2020   Colon polyps    adenomatous   Constipation 08/20/2013   Corns and callosities    Diverticulosis    DOE (dyspnea on exertion) 10/09/2020   Dysuria 06/03/2011   Last Assessment & Plan:  Formatting of this note might be different from the original. New, acute complaint today;  Will check urinalysis with her labs today;   Full thickness rotator cuff tear 02/07/2022   GERD (gastroesophageal reflux disease)    Glaucoma    H/O total hysterectomy 02/19/2013   Hallux valgus 10/26/2010   Last Assessment & Plan:  Formatting of this note might be different from the original. Had surgery with Dr Ernestine Conrad back in October;   Will have her follow up with him.   Heartburn 04/30/2014   Helicobacter pylori gastritis 08/12/2014   Hematoma of lower leg 06/12/2018   Hiatal hernia with gastroesophageal reflux 12/23/2014   Hypercholesteremia    no meds   Hypercholesterolemia 06/03/2011   Overview:  TC 205 (elevated) in 2010  Last Assessment & Plan:  Formatting of this  note might be different from the original. Lab Results  Component Value Date   CHOL 205 12/09/2011   CHOL 233 06/03/2011   CHOL 205 05/26/2009   Lab Results  Component Value Date   HDL 82 12/09/2011   HDL 90 06/03/2011   HDL 75 05/26/2009   Lab Results  Component Value Date   LDLCALC 113 12/09/2011   LDLCALC 133 06/03/2011   LDLCA   Hypothyroidism    Left-sided low back pain with left-sided sciatica 08/12/2014   Neck pain 10/26/2010   Obesity    Osteoarthritis 10/26/2010   Osteoarthritis of acromioclavicular joint 02/07/2022   Osteopenia 01/08/2014   Overweight 10/26/2010   Postmenopausal estrogen deficiency 02/19/2013   Recurrent UTI 06/25/2019   Restless leg syndrome 08/20/2013   Routine general medical examination at a health care facility 02/19/2013   S/P carpal tunnel release 10/26/2010   Screen for colon cancer 12/16/2011   Last Assessment & Plan:  Formatting of this note might be different from the original. Refer to GI;   Screening for diabetes mellitus    Shoulder pain, right 12/16/2011   Last Assessment & Plan:  Formatting of this note might be different from the original. We have tried physical therapy;   We shot and reviewed films today;  She  has  1)  Osteopenia 2)  Small spur at humeral head 3)  Some hypertrophic/lipping changes at the inferior glenoid rim;   I also injected it last visit;  She is on NSAIDS and tylenol;  Will trial voltaren gel;  Then get ortho opinion;   Skin spots-aging 07/08/2014   Strain of neck muscle 06/12/2018   Urge incontinence    Whiplash injury 06/26/2018   Past Surgical History:  Procedure Laterality Date   ABDOMINAL HYSTERECTOMY  1985   Dr Kathyrn Drown Right    Dr Ernestine Conrad   CARPAL TUNNEL RELEASE Bilateral 2010   Dr Christell Constant   CATARACT EXTRACTION Bilateral    CESAREAN SECTION  1983   1 time   COLONOSCOPY     ROTATOR CUFF REPAIR Left 03/10/2022   Dr Thomasena Edis   THYROIDECTOMY  1987   Dr Raechel Ache   TONSILLECTOMY  1951   Patient Active Problem List    Diagnosis Date Noted   Biceps tendinitis 02/07/2022   Full thickness rotator cuff tear 02/07/2022   Cardiac murmur 10/09/2020   Allergic rhinitis    Anxiety    Arthritis    Corns and callosities    Diverticulosis    Glaucoma    Hypercholesteremia    Obesity    Urge incontinence    Atrophic vaginitis 06/25/2019   Whiplash injury 06/26/2018   Strain of neck muscle 06/12/2018   Hiatal hernia with gastroesophageal reflux 12/23/2014   Left-sided low back pain with left-sided sciatica 08/12/2014   Osteopenia 01/08/2014   Restless leg syndrome 08/20/2013   Constipation 08/20/2013   Postmenopausal estrogen deficiency 02/19/2013   Hypothyroidism 10/26/2010   Osteoarthritis 10/26/2010   S/P carpal tunnel release 10/26/2010   Hallux valgus 10/26/2010    PCP: Abbey Chatters, NP  REFERRING PROVIDER: Beverely Low, MD  REFERRING DIAG: L shoulder rotator cuff syndrome, pain  THERAPY DIAG:  Chronic left shoulder pain  Decreased functional activity tolerance  Muscle weakness of left arm  Stiffness of left shoulder, not elsewhere classified  Rationale for Evaluation and Treatment: Rehabilitation  ONSET DATE: MAY 2023  SUBJECTIVE:                                                                                                                                                                                      SUBJECTIVE STATEMENT: Same old same old, hurts all the time. I take pain pills for my lower back, they dont touch the pain in my shoulder.  I just try to pack in with pillows. Hand dominance: Right  PERTINENT HISTORY: May 2023 had L shoulder rotator cuff repair.   PAIN:  Are you having pain?  Yes: NPRS scale: 10/10 Pain location: L shoulder upper arm upper back Pain description: constant aching, skin is tender Aggravating factors: movement, palpation, sleeping Relieving factors: using nursing pillow for sleeping, taking tramadol with little effect  PRECAUTIONS:  None  RED FLAGS: None   WEIGHT BEARING RESTRICTIONS: No  FALLS:  Has patient fallen in last 6 months? No  LIVING ENVIRONMENT: Lives with: lives with their family Lives in: House/apartment Stairs: Yes: External:   steps; on right going up Has following equipment at home: None  OCCUPATION: retired  PLOF: Independent with basic ADLs  PATIENT GOALS:need relief from this pain  NEXT MD VISIT:   OBJECTIVE:   DIAGNOSTIC FINDINGS:  Actual MRI not available, but daughter and pt report MRI shows deteriorated rotator cuff L , as well as arthritic changes L shoulder  PATIENT SURVEYS:  Quick Dash 84% disability  COGNITION: Overall cognitive status: Within functional limits for tasks assessed     SENSATION: WFL  POSTURE: Guarded posture, l shoulder protracted and depressed compared to R with loss of deltoid musculature  UPPER EXTREMITY ROM: L hand, wrist, forearm, elbow all wnl AROM although strained for full L elbow flexion movement.   Patient did not tolerate L shoulder PROM, noted able to elevate to 85 degrees scaption for donning,doffing shirt.  Patient unable to attempt other movements such as reach behind back or hand behind neck/upper back.  UPPER EXTREMITY MMT: L hand grasp 4/5, all other L UE MMT not performed due to inflammatory nature of pain L UE  JOINT MOBILITY TESTING:  Unable to assess  PALPATION:  Hypersensitive, exquisite tenderness L shoulder jt, upper arm   TODAY'S TREATMENT:                                                                                                                                         DATE:  06/28/23:Therapeutic exercise:   Positioned the patient in supine, with moist heat L shoulder, while therapist assisting with PROM, isometrics. Obvious palpable bulge distal biceps just superior to elbow, "popeye" deformity. In supine therapist utilized retrograde massage, also patient in L side lying with rolled towel under axilla for cross  friction massage L infraspinatus Gentle LUE PROM in supine, patient able to tolerate 50 degrees flexion,40 degrees abd, 0 ER, marked crepitus, clunk with any pressure, movement into ER Supine AAROM L elbow flex/ ext Supine for isometrics for L shoulder abd, add, ER, IR 5 sec holds, light pressure   06/26/23: therex:  Supine for moist heat L shoulder, while therapist provided gentle resistance for isometric L shoulder IR, ER, biceps, triceps, 5 sec holds, 10 x each Supine for gentle PROM L shoulder all planes, pt tolerated 20 degrees flex, abd, scaption, did not tolerate any ER Seated for manually resisted isometric L shoulder ext 5 sec holds, 10 x Bicep curls, 10 reps, no wt Seated L hand on her thigh for  pendulum motions cw, ccw, flex/ext  06/21/23 AAROM Flex, Ext, IR up back w/ cane Isometrics Flex, ext, abd, Internal and external rotation  Biceps Curls 1lb 2x10 Gentle LUE PROM in supine, patient able to tolerate 40 degrees flexion,40 degrees abd, 0 ER, marked crepitus, clunk with any pressure, movement into ER Supine AAROM L elbow flex/ ext Supine for isometrics for L shoulder abd, add, ER, IR 5 sec holds, light pressure  Seated for modified pendulums, cw and ccw with L hand on thigh  06/1923 AAROM Flex, Ext, IR up back w/ cane Scapular retractions 2x10 Biceps Curls 1lb x10 Isometrics Flex, ext, abd, Internal and external rotation  Gentle LUE PROM    06/13/23 Attempted some light massage and PROM but unable to get much as she is very sensitive  Table slides flexion and abd 2x10 very minimal movement  MH and IFC x12 mins to L shoulder   06/12/23:  Eval, attempted light low grade isometrics for flex, ext L shoulder with increased pain noted by pt Iontophoresis, applied ibresis patch with 0.04% dexamethasome L ant shoulder over superior biceps. Advised to leave patch in place for additional medication absorption, for another 3 1/2 hrs post this appt. Also kinesiotaping, 2- I pieces  applied, one lat upper arm to upper traps, and one ant upper arm to upper traps, utilized to reduce strain rotator cuff L   PATIENT EDUCATION: Education details: POC, goals Person educated: Patient and Child(ren) Education method: Explanation, Demonstration, Tactile cues, and Verbal cues Education comprehension: verbalized understanding, verbal cues required, and tactile cues required  HOME EXERCISE PROGRAM: TBD  ASSESSMENT:  CLINICAL IMPRESSION: Patient is a 77 y.o. female who was seen for physical therapy treatment to address L shoulder pain/ weakness. She has marked pain, very guarded. Obvious muscle wasting L shoulder as compared to R, "popeye" deformity L distal humerus, probably biceps tear.  Poor tolerance to all activities, audible crepitus/clunking L shoulder.  No progress towards goals. She is eager to schedule surgery for L TSA. Therapist contacted referring orthopedist's office regarding ongoing pain and no change in Sx, no improvement with PT.  Was advised that holding PT at this time will be appropriate after 6 visits without change, however due to insurance requirements after extensive discussion with pt and daughter determined to continue 2 x next week , patient to see orthopedic PA Sept 11.    OBJECTIVE IMPAIRMENTS: decreased ROM, decreased strength, and pain.   ACTIVITY LIMITATIONS: carrying, lifting, sleeping, bed mobility, bathing, toileting, dressing, reach over head, and hygiene/grooming  PARTICIPATION LIMITATIONS: meal prep, cleaning, laundry, driving, shopping, and community activity  PERSONAL FACTORS: Age, Behavior pattern, Past/current experiences, Time since onset of injury/illness/exacerbation, and 1-2 comorbidities: L shoulder rotator cuff repair prior,   are also affecting patient's functional outcome.   REHAB POTENTIAL: Fair    CLINICAL DECISION MAKING: Stable/uncomplicated  EVALUATION COMPLEXITY: Low   GOALS: Goals reviewed with patient? Yes  SHORT  TERM GOALS: Target date: 2 weeks 06/26/23  I HEP Baseline: Goal status: INITIAL  LONG TERM GOALS: Target date: 07/24/23  Improve ROM L shoulder for flex, abd, to 100 or greater, L shoulder ER 45 or greater Baseline: 85 degrees elevation, rotation not observed Goal status: No progress 8/21  2.  Improve quick DASh from 86% deficit to 50% or better Baseline:  Goal status: INITIAL  3.  Strength L shoulder biceps, flex, abd, to 3+/5 Baseline: 2+ to 3- Goal status: No progress 8/21    PLAN:  PT FREQUENCY: 1-2x/week  PT DURATION: 6 weeks  PLANNED INTERVENTIONS: Therapeutic exercises, Therapeutic activity, Neuromuscular re-education, Balance training, Gait training, Patient/Family education, Self Care, Joint mobilization, and Ionotophoresis 4mg /ml Dexamethasone  PLAN FOR NEXT SESSION: light movements as tolerated, work on bringing her pain levels down   Lavante Toso L Mykhia Danish, PT, DPT, OCS 06/28/2023, 4:19 PM

## 2023-06-30 ENCOUNTER — Ambulatory Visit: Payer: Medicare HMO | Admitting: Nurse Practitioner

## 2023-07-04 ENCOUNTER — Ambulatory Visit: Payer: Medicare HMO

## 2023-07-06 ENCOUNTER — Ambulatory Visit: Payer: Medicare HMO

## 2023-07-10 ENCOUNTER — Ambulatory Visit: Payer: Medicare HMO

## 2023-07-12 ENCOUNTER — Ambulatory Visit: Payer: Medicare HMO

## 2023-07-12 ENCOUNTER — Other Ambulatory Visit (HOSPITAL_BASED_OUTPATIENT_CLINIC_OR_DEPARTMENT_OTHER): Payer: Self-pay

## 2023-07-12 DIAGNOSIS — Z79891 Long term (current) use of opiate analgesic: Secondary | ICD-10-CM | POA: Diagnosis not present

## 2023-07-12 DIAGNOSIS — M545 Low back pain, unspecified: Secondary | ICD-10-CM | POA: Diagnosis not present

## 2023-07-12 DIAGNOSIS — G8929 Other chronic pain: Secondary | ICD-10-CM | POA: Diagnosis not present

## 2023-07-12 DIAGNOSIS — M47816 Spondylosis without myelopathy or radiculopathy, lumbar region: Secondary | ICD-10-CM | POA: Diagnosis not present

## 2023-07-12 DIAGNOSIS — M25512 Pain in left shoulder: Secondary | ICD-10-CM | POA: Diagnosis not present

## 2023-07-12 MED ORDER — HYDROCODONE-ACETAMINOPHEN 5-325 MG PO TABS
1.0000 | ORAL_TABLET | Freq: Three times a day (TID) | ORAL | 0 refills | Status: AC | PRN
  Filled 2023-07-12: qty 90, 30d supply, fill #0

## 2023-07-17 ENCOUNTER — Ambulatory Visit: Payer: Medicare HMO

## 2023-07-19 ENCOUNTER — Ambulatory Visit: Payer: Medicare HMO

## 2023-07-20 ENCOUNTER — Telehealth: Payer: Medicare HMO | Admitting: *Deleted

## 2023-07-20 NOTE — Telephone Encounter (Signed)
Initiated Prior Authorization through [employer] for [employer].  Spoke with Cornell Barman [phone] and [employer] was APPROVED 07/20/2023-07/19/2024 Auth #: [id number]  Rep. Will Fax confirmation also.

## 2023-07-26 ENCOUNTER — Other Ambulatory Visit: Payer: Self-pay | Admitting: Nurse Practitioner

## 2023-07-31 DIAGNOSIS — Z9889 Other specified postprocedural states: Secondary | ICD-10-CM | POA: Diagnosis not present

## 2023-07-31 DIAGNOSIS — M19012 Primary osteoarthritis, left shoulder: Secondary | ICD-10-CM | POA: Diagnosis not present

## 2023-07-31 DIAGNOSIS — G8918 Other acute postprocedural pain: Secondary | ICD-10-CM | POA: Diagnosis not present

## 2023-08-10 ENCOUNTER — Other Ambulatory Visit (HOSPITAL_BASED_OUTPATIENT_CLINIC_OR_DEPARTMENT_OTHER): Payer: Self-pay

## 2023-08-10 DIAGNOSIS — Z4789 Encounter for other orthopedic aftercare: Secondary | ICD-10-CM | POA: Diagnosis not present

## 2023-08-10 DIAGNOSIS — G8929 Other chronic pain: Secondary | ICD-10-CM | POA: Diagnosis not present

## 2023-08-10 DIAGNOSIS — M25512 Pain in left shoulder: Secondary | ICD-10-CM | POA: Diagnosis not present

## 2023-08-10 DIAGNOSIS — Z79891 Long term (current) use of opiate analgesic: Secondary | ICD-10-CM | POA: Diagnosis not present

## 2023-08-10 DIAGNOSIS — M545 Low back pain, unspecified: Secondary | ICD-10-CM | POA: Diagnosis not present

## 2023-08-10 DIAGNOSIS — M47816 Spondylosis without myelopathy or radiculopathy, lumbar region: Secondary | ICD-10-CM | POA: Diagnosis not present

## 2023-08-10 MED ORDER — HYDROCODONE-ACETAMINOPHEN 5-325 MG PO TABS
1.0000 | ORAL_TABLET | Freq: Four times a day (QID) | ORAL | 0 refills | Status: DC | PRN
  Filled 2023-08-10: qty 120, 30d supply, fill #0

## 2023-08-25 ENCOUNTER — Ambulatory Visit: Admit: 2023-08-25 | Payer: Medicare HMO | Admitting: Orthopedic Surgery

## 2023-08-25 SURGERY — ARTHROPLASTY, SHOULDER, TOTAL, REVERSE
Anesthesia: Choice | Site: Shoulder | Laterality: Left

## 2023-09-04 ENCOUNTER — Encounter: Payer: Self-pay | Admitting: Nurse Practitioner

## 2023-09-04 ENCOUNTER — Ambulatory Visit (INDEPENDENT_AMBULATORY_CARE_PROVIDER_SITE_OTHER): Payer: Medicare HMO | Admitting: Nurse Practitioner

## 2023-09-04 VITALS — BP 126/74 | HR 66 | Temp 97.1°F | Ht 61.0 in | Wt 164.0 lb

## 2023-09-04 DIAGNOSIS — G8929 Other chronic pain: Secondary | ICD-10-CM | POA: Diagnosis not present

## 2023-09-04 DIAGNOSIS — E7800 Pure hypercholesterolemia, unspecified: Secondary | ICD-10-CM

## 2023-09-04 DIAGNOSIS — M545 Low back pain, unspecified: Secondary | ICD-10-CM | POA: Diagnosis not present

## 2023-09-04 DIAGNOSIS — I10 Essential (primary) hypertension: Secondary | ICD-10-CM | POA: Diagnosis not present

## 2023-09-04 DIAGNOSIS — M810 Age-related osteoporosis without current pathological fracture: Secondary | ICD-10-CM | POA: Diagnosis not present

## 2023-09-04 DIAGNOSIS — K5903 Drug induced constipation: Secondary | ICD-10-CM

## 2023-09-04 DIAGNOSIS — M25512 Pain in left shoulder: Secondary | ICD-10-CM

## 2023-09-04 DIAGNOSIS — E039 Hypothyroidism, unspecified: Secondary | ICD-10-CM

## 2023-09-04 MED ORDER — DENOSUMAB 60 MG/ML ~~LOC~~ SOSY
60.0000 mg | PREFILLED_SYRINGE | Freq: Once | SUBCUTANEOUS | Status: AC
Start: 2023-09-04 — End: 2023-09-04
  Administered 2023-09-04: 60 mg via SUBCUTANEOUS

## 2023-09-04 MED ORDER — DENOSUMAB 60 MG/ML ~~LOC~~ SOSY
60.0000 mg | PREFILLED_SYRINGE | Freq: Once | SUBCUTANEOUS | Status: DC
Start: 2024-03-05 — End: 2024-03-08

## 2023-09-04 NOTE — Progress Notes (Signed)
[name]: Patient Care Team: Sharon Seller, NP as PCP - General (Nurse Practitioner)  PLACE OF SERVICE:  [facility]  Advanced Directive information Does Patient Have a Medical Advance Directive?: Yes, Type of Advance Directive: Healthcare Power of [place];Out of facility DNR (pink MOST or yellow form), Pre-existing out of facility DNR order (yellow form or pink MOST form): Pink MOST form placed in chart (order not valid for inpatient use), Does patient want to make changes to medical advance directive?: No - Patient declined  No Known Allergies  Chief Complaint  Patient presents with   Medical Management of Chronic Issues    6 month follow-up and prolia injection      HPI: Patient is a 77 y.o. female for routine follow up.   Had left shoulder surgery in September- daughter reports she is finally going in the right direction.   Osteoporosis- prolia today  Continues to have chronic pain in the back but the patch helps control pain.  And hydrocodone- apap  She is taking miralax every morning for constipation which controls pain- will take senna at night  GERD- controlled on omprazole.   Review of Systems:  Review of Systems  Constitutional:  Negative for chills, fever and weight loss.  HENT:  Negative for tinnitus.   Respiratory:  Negative for cough, sputum production and shortness of breath.   Cardiovascular:  Negative for chest pain, palpitations and leg swelling.  Gastrointestinal:  Negative for abdominal pain, constipation, diarrhea and heartburn.  Genitourinary:  Negative for dysuria, frequency and urgency.  Musculoskeletal:  Positive for back pain and joint pain. Negative for falls and myalgias.  Skin: Negative.   Neurological:  Negative for dizziness and headaches.  Psychiatric/Behavioral:  Negative for depression and memory loss. The patient does not have insomnia.     Past Medical History:  Diagnosis Date   Allergic rhinitis    Anxiety    Arthritis     Arthritis of left wrist 10/13/2021   Atrophic vaginitis 06/25/2019   Biceps tendinitis 02/07/2022   Breast screening 10/26/2010   Last Assessment & Plan:  Formatting of this note might be different from the original. Will order a mammogram for this fall;  Follow up with me around Thanksgiving.   Callus of heel 02/19/2013   Cardiac murmur 10/09/2020   Colon polyps    adenomatous   Constipation 08/20/2013   Corns and callosities    Diverticulosis    DOE (dyspnea on exertion) 10/09/2020   Dysuria 06/03/2011   Last Assessment & Plan:  Formatting of this note might be different from the original. New, acute complaint today;  Will check urinalysis with her labs today;   Full thickness rotator cuff tear 02/07/2022   GERD (gastroesophageal reflux disease)    Glaucoma    H/O total hysterectomy 02/19/2013   Hallux valgus 10/26/2010   Last Assessment & Plan:  Formatting of this note might be different from the original. Had surgery with Dr Ernestine Conrad back in October;   Will have her follow up with him.   Heartburn 04/30/2014   Helicobacter pylori gastritis 08/12/2014   Hematoma of lower leg 06/12/2018   Hiatal hernia with gastroesophageal reflux 12/23/2014   Hypercholesteremia    no meds   Hypercholesterolemia 06/03/2011   Overview:  TC 205 (elevated) in 2010  Last Assessment & Plan:  Formatting of this note might be different from the original. Lab Results  Component Value Date   CHOL 205 12/09/2011   CHOL 233 06/03/2011  CHOL 205 05/26/2009   Lab Results  Component Value Date   HDL 82 12/09/2011   HDL 90 06/03/2011   HDL 75 05/26/2009   Lab Results  Component Value Date   LDLCALC 113 12/09/2011   LDLCALC 133 06/03/2011   LDLCA   Hypothyroidism    Left-sided low back pain with left-sided sciatica 08/12/2014   Neck pain 10/26/2010   Obesity    Osteoarthritis 10/26/2010   Osteoarthritis of acromioclavicular joint 02/07/2022   Osteopenia 01/08/2014   Overweight 10/26/2010   Postmenopausal estrogen deficiency  02/19/2013   Recurrent UTI 06/25/2019   Restless leg syndrome 08/20/2013   Routine general medical examination at a health care facility 02/19/2013   S/P carpal tunnel release 10/26/2010   Screen for colon cancer 12/16/2011   Last Assessment & Plan:  Formatting of this note might be different from the original. Refer to GI;   Screening for diabetes mellitus    Shoulder pain, right 12/16/2011   Last Assessment & Plan:  Formatting of this note might be different from the original. We have tried physical therapy;   We shot and reviewed films today;  She has  1)  Osteopenia 2)  Small spur at humeral head 3)  Some hypertrophic/lipping changes at the inferior glenoid rim;   I also injected it last visit;  She is on NSAIDS and tylenol;  Will trial voltaren gel;  Then get ortho opinion;   Skin spots-aging 07/08/2014   Strain of neck muscle 06/12/2018   Urge incontinence    Whiplash injury 06/26/2018   Past Surgical History:  Procedure Laterality Date   ABDOMINAL HYSTERECTOMY  1985   Dr Kathyrn Drown Right    Dr Ernestine Conrad   CARPAL TUNNEL RELEASE Bilateral 2010   Dr Christell Constant   CATARACT EXTRACTION Bilateral    CESAREAN SECTION  1983   1 time   COLONOSCOPY     ROTATOR CUFF REPAIR Left 03/10/2022   Dr Thomasena Edis   THYROIDECTOMY  1987   Dr Raechel Ache   TONSILLECTOMY  1951   Social History:   reports that she quit smoking about 36 years ago. Her smoking use included cigarettes. She has never used smokeless tobacco. She reports that she does not drink alcohol and does not use drugs.  Family History  Problem Relation Age of Onset   Leukemia Mother 72   Lung cancer Father    Diabetes Brother    COPD Brother    Lung cancer Brother    Diabetes Maternal Grandmother    Cancer Maternal Grandfather        unknown type   Breast cancer Maternal Aunt    Colon polyps Neg Hx    Rectal cancer Neg Hx     Medications: Patient's Medications  New Prescriptions   No medications on file  Previous  Medications   ASCORBIC ACID (VITAMIN C PO)    Take 1 tablet by mouth daily.   ASPIRIN EC 81 MG TABLET    Take 81 mg by mouth every other day. Swallow whole.   ATORVASTATIN (LIPITOR) 10 MG TABLET    TAKE 0.5 TABLETS (5 MG TOTAL) BY MOUTH DAILY. E78.00   CALCIUM-VITAMIN D PO    Take 1 capsule by mouth daily.   CRANBERRY PO    Take 1 tablet by mouth daily.   CYCLOBENZAPRINE (FLEXERIL) 10 MG TABLET    Take 10 mg by mouth at bedtime.   DENOSUMAB (PROLIA) 60 MG/ML SOSY INJECTION    Inject 60  mg into the skin every 6 (six) months.   DICLOFENAC SODIUM (VOLTAREN) 1 % GEL    APPLY 2 GRAMS TOPICALLY 4 (FOUR) TIMES DAILY AS NEEDED.   HYDROCHLOROTHIAZIDE (HYDRODIURIL) 12.5 MG TABLET    TAKE 0.5 TABLETS BY MOUTH DAILY.   HYDROCODONE-ACETAMINOPHEN (NORCO/VICODIN) 5-325 MG TABLET    Take 1 tablet by mouth every 8 (eight) hours as needed.   HYDROCODONE-ACETAMINOPHEN (NORCO/VICODIN) 5-325 MG TABLET    Take 1 tablet by mouth every 4 (four) hours.   HYDROXYPROPYL METHYLCELLULOSE / HYPROMELLOSE (ISOPTO TEARS / GONIOVISC) 2.5 % OPHTHALMIC SOLUTION    Place 1 drop into both eyes as needed for dry eyes.    KRILL OIL OMEGA-3 PO    Take 1 tablet by mouth daily.   LEVOTHYROXINE (SYNTHROID) 25 MCG TABLET    TAKE 1 TABLET BY MOUTH EVERY DAY BEFORE BREAKFAST   LIDOCAINE (LIDODERM) 5 %    Place 1 patch onto the skin daily. Remove & Discard patch within 12 hours or as directed by MD   METHOCARBAMOL (ROBAXIN) 500 MG TABLET    Take 500 mg by mouth daily. Every morning   MULTIPLE VITAMIN (MULTIVITAMIN WITH MINERALS) TABS    Take 1 tablet by mouth daily.   OMEPRAZOLE (PRILOSEC) 40 MG CAPSULE    Take 40 mg by mouth daily.  Modified Medications   No medications on file  Discontinued Medications   CALCIUM CARBONATE (OS-CAL) 600 MG TABS TABLET    Take 600 mg by mouth daily.   CHOLECALCIFEROL 50 MCG (2000 UT) TABS    Take 1 tablet by mouth daily.   HM LIDOCAINE PATCH EX    Apply 1 patch topically as directed. Apply one patch in the  morning and take off and use a different patch at night if needed for back pain   HYDROCODONE-ACETAMINOPHEN (NORCO/VICODIN) 5-325 MG TABLET    Take 1 tablet by mouth every 8 (eight) hours as needed.   HYDROCODONE-ACETAMINOPHEN (NORCO/VICODIN) 5-325 MG TABLET    Take 1 tablet by mouth every 6 (six) hours as needed.   LYRICA 50 MG CAPSULE    Take 50 mg by mouth 3 (three) times daily.    Physical Exam:  Vitals:   09/04/23 0757  BP: 126/74  Pulse: 66  Temp: (!) 97.1 F (36.2 C)  TempSrc: Temporal  Weight: 164 lb (74.4 kg)  Height: 5\' 1"  (1.549 m)   Body mass index is 30.99 kg/m. Wt Readings from Last 3 Encounters:  09/04/23 164 lb (74.4 kg)  06/22/23 163 lb 11.2 oz (74.3 kg)  02/24/23 164 lb 6.4 oz (74.6 kg)    Physical Exam Constitutional:      General: She is not in acute distress.    Appearance: She is well-developed. She is not diaphoretic.  HENT:     Head: Normocephalic and atraumatic.     Mouth/Throat:     Pharynx: No oropharyngeal exudate.  Eyes:     Conjunctiva/sclera: Conjunctivae normal.     Pupils: Pupils are equal, round, and reactive to light.  Cardiovascular:     Rate and Rhythm: Normal rate and regular rhythm.     Heart sounds: Normal heart sounds.  Pulmonary:     Effort: Pulmonary effort is normal.     Breath sounds: Normal breath sounds.  Abdominal:     General: Bowel sounds are normal.     Palpations: Abdomen is soft.  Musculoskeletal:        General: No tenderness.     Cervical back:  Normal range of motion and neck supple.     Comments: Limited ROM to bilateral shoulders   Skin:    General: Skin is warm and dry.  Neurological:     Mental Status: She is alert and oriented to person, place, and time.     Labs reviewed: Basic Metabolic Panel: Recent Labs    02/24/23 1016 06/22/23 1346  NA 143 143  K 4.0 5.2  CL 106 107  CO2 29 31  GLUCOSE 129* 93  BUN 19 18  CREATININE 1.14* 1.01*  CALCIUM 9.2 9.7  TSH 0.67  --    Liver Function  Tests: Recent Labs    02/24/23 1016  AST 30  ALT 15  BILITOT 0.5  PROT 6.5   No results for input(s): "LIPASE", "AMYLASE" in the last 8760 hours. No results for input(s): "AMMONIA" in the last 8760 hours. CBC: Recent Labs    02/24/23 1016 06/22/23 1346  WBC 3.5* 4.3  NEUTROABS 1,334* 1,883  HGB 11.4* 11.4*  HCT 36.1 35.6  MCV 93.8 95.4  PLT 213 313   Lipid Panel: Recent Labs    02/24/23 1016  CHOL 182  HDL 81  LDLCALC 85  TRIG 75  CHOLHDL 2.2   TSH: Recent Labs    02/24/23 1016  TSH 0.67   A1C: Lab Results  Component Value Date   HGBA1C 5.4 06/22/2023     Assessment/Plan 1. Osteoporosis without current pathological fracture, unspecified osteoporosis type -continue cal with vit d and weight bearing exercises with prolia every 6 months  - denosumab (PROLIA) injection 60 mg  2. Essential (primary) hypertension -Blood pressure well controlled, goal bp <140/90 Continue current medications and dietary modifications follow metabolic panel  3. Chronic left shoulder pain -s/p arthroplasty, doing well post op. Continues with ROM exercises and orthopedic follow up  4. Chronic left-sided low back pain without sciatica -stable, continues to follow up with pain management.   6. Acquired hypothyroidism TSH at goal on last labs, continues on synthroid 25 mcg, will follow up at next appt  7. Hypercholesteremia LDL at goal in April, continues on lipitor with dietary modifications  8. Drug-induced constipation Well controlled with OTC regimen   Return in about 6 months (around 03/03/2024) for routine follow up, labs at appt .  Janene Harvey. [facility] [phone]

## 2023-09-05 ENCOUNTER — Other Ambulatory Visit: Payer: Self-pay | Admitting: Nurse Practitioner

## 2023-09-05 DIAGNOSIS — M150 Primary generalized (osteo)arthritis: Secondary | ICD-10-CM

## 2023-09-05 NOTE — Telephone Encounter (Signed)
Forwarded to [employer] is on Materials engineer per pharmacy.   Needs Alternative.     Name from pharmacy: DICLOFENAC SODIUM 1% GEL        Will file in chart as: diclofenac Sodium (VOLTAREN) 1 % GEL   Sig: APPLY 2 GRAMS TOPICALLY 4 (FOUR) TIMES DAILY AS NEEDED.   Disp: 400 g    Refills: 11   Start: 09/05/2023   Class: Normal   For: Primary osteoarthritis involving multiple joints   Last ordered: Today (09/05/2023) by Sharon Seller, NP   Last refill: 09/05/2023   Rx #: [id number]   Pharmacy comment: Product Backordered/Unavailable.     To be filled at: [id number] - [place],  - [address]

## 2023-09-07 DIAGNOSIS — M47816 Spondylosis without myelopathy or radiculopathy, lumbar region: Secondary | ICD-10-CM | POA: Diagnosis not present

## 2023-09-07 DIAGNOSIS — M25512 Pain in left shoulder: Secondary | ICD-10-CM | POA: Diagnosis not present

## 2023-09-07 DIAGNOSIS — G8929 Other chronic pain: Secondary | ICD-10-CM | POA: Diagnosis not present

## 2023-09-07 DIAGNOSIS — Z79891 Long term (current) use of opiate analgesic: Secondary | ICD-10-CM | POA: Diagnosis not present

## 2023-09-07 DIAGNOSIS — M545 Low back pain, unspecified: Secondary | ICD-10-CM | POA: Diagnosis not present

## 2023-09-14 ENCOUNTER — Other Ambulatory Visit (HOSPITAL_BASED_OUTPATIENT_CLINIC_OR_DEPARTMENT_OTHER): Payer: Self-pay | Admitting: Nurse Practitioner

## 2023-09-14 DIAGNOSIS — Z139 Encounter for screening, unspecified: Secondary | ICD-10-CM

## 2023-09-19 ENCOUNTER — Ambulatory Visit (HOSPITAL_BASED_OUTPATIENT_CLINIC_OR_DEPARTMENT_OTHER)
Admission: RE | Admit: 2023-09-19 | Discharge: 2023-09-19 | Disposition: A | Discharge status: Home / Self Care | Payer: Medicare HMO | Source: Ambulatory Visit | Attending: Nurse Practitioner | Admitting: Nurse Practitioner

## 2023-09-19 ENCOUNTER — Encounter (HOSPITAL_BASED_OUTPATIENT_CLINIC_OR_DEPARTMENT_OTHER): Payer: Self-pay

## 2023-09-19 DIAGNOSIS — Z1231 Encounter for screening mammogram for malignant neoplasm of breast: Secondary | ICD-10-CM | POA: Diagnosis not present

## 2023-09-19 DIAGNOSIS — Z139 Encounter for screening, unspecified: Secondary | ICD-10-CM | POA: Diagnosis not present

## 2023-10-03 NOTE — Therapy (Signed)
OUTPATIENT PHYSICAL THERAPY SHOULDER EVALUATION   Patient Name: Colleen [name] MRN: [id number] DOB:[birth year], 77 y.o., female Today's Date: 10/04/2023  END OF SESSION:  PT End of Session - 10/04/23 1015     Visit Number 1    Date for PT Re-Evaluation 11/29/23    Authorization Type [employer]    PT Start Time 1015    PT Stop Time 1100    PT Time Calculation (min) 45 min    Activity Tolerance Patient tolerated treatment well    Behavior During Therapy WFL for tasks assessed/performed             Past Medical History:  Diagnosis Date   Allergic rhinitis    Anxiety    Arthritis    Arthritis of left wrist 10/13/2021   Atrophic vaginitis 06/25/2019   Biceps tendinitis 02/07/2022   Breast screening 10/26/2010   Last Assessment & Plan:  Formatting of this note might be different from the original. Will order a mammogram for this fall;  Follow up with me around Thanksgiving.   Callus of heel 02/19/2013   Cardiac murmur 10/09/2020   Colon polyps    adenomatous   Constipation 08/20/2013   Corns and callosities    Diverticulosis    DOE (dyspnea on exertion) 10/09/2020   Dysuria 06/03/2011   Last Assessment & Plan:  Formatting of this note might be different from the original. New, acute complaint today;  Will check urinalysis with her labs today;   Full thickness rotator cuff tear 02/07/2022   GERD (gastroesophageal reflux disease)    Glaucoma    H/O total hysterectomy 02/19/2013   Hallux valgus 10/26/2010   Last Assessment & Plan:  Formatting of this note might be different from the original. Had surgery with Dr Ernestine Conrad back in October;   Will have her follow up with him.   Heartburn 04/30/2014   Helicobacter pylori gastritis 08/12/2014   Hematoma of lower leg 06/12/2018   Hiatal hernia with gastroesophageal reflux 12/23/2014   Hypercholesteremia    no meds   Hypercholesterolemia 06/03/2011   Overview:  TC 205 (elevated) in 2010  Last Assessment & Plan:  Formatting of  this note might be different from the original. Lab Results  Component Value Date   CHOL 205 12/09/2011   CHOL 233 06/03/2011   CHOL 205 05/26/2009   Lab Results  Component Value Date   HDL 82 12/09/2011   HDL 90 06/03/2011   HDL 75 05/26/2009   Lab Results  Component Value Date   LDLCALC 113 12/09/2011   LDLCALC 133 06/03/2011   LDLCA   Hypothyroidism    Left-sided low back pain with left-sided sciatica 08/12/2014   Neck pain 10/26/2010   Obesity    Osteoarthritis 10/26/2010   Osteoarthritis of acromioclavicular joint 02/07/2022   Osteopenia 01/08/2014   Overweight 10/26/2010   Postmenopausal estrogen deficiency 02/19/2013   Recurrent UTI 06/25/2019   Restless leg syndrome 08/20/2013   Routine general medical examination at a health care facility 02/19/2013   S/P carpal tunnel release 10/26/2010   Screen for colon cancer 12/16/2011   Last Assessment & Plan:  Formatting of this note might be different from the original. Refer to GI;   Screening for diabetes mellitus    Shoulder pain, right 12/16/2011   Last Assessment & Plan:  Formatting of this note might be different from the original. We have tried physical therapy;   We shot and reviewed films today;  She has  1)  Osteopenia  2)  Small spur at humeral head 3)  Some hypertrophic/lipping changes at the inferior glenoid rim;   I also injected it last visit;  She is on NSAIDS and tylenol;  Will trial voltaren gel;  Then get ortho opinion;   Skin spots-aging 07/08/2014   Strain of neck muscle 06/12/2018   Urge incontinence    Whiplash injury 06/26/2018   Past Surgical History:  Procedure Laterality Date   ABDOMINAL HYSTERECTOMY  1985   Dr Kathyrn Drown Right    Dr Ernestine Conrad   CARPAL TUNNEL RELEASE Bilateral 2010   Dr Christell Constant   CATARACT EXTRACTION Bilateral    CESAREAN SECTION  1983   1 time   COLONOSCOPY     ROTATOR CUFF REPAIR Left 03/10/2022   Dr Thomasena Edis   THYROIDECTOMY  1987   Dr Raechel Ache   TONSILLECTOMY  1951   Patient Active Problem  List   Diagnosis Date Noted   Biceps tendinitis 02/07/2022   Full thickness rotator cuff tear 02/07/2022   Cardiac murmur 10/09/2020   Allergic rhinitis    Anxiety    Arthritis    Corns and callosities    Diverticulosis    Glaucoma    Hypercholesteremia    Obesity    Urge incontinence    Atrophic vaginitis 06/25/2019   Whiplash injury 06/26/2018   Strain of neck muscle 06/12/2018   Hiatal hernia with gastroesophageal reflux 12/23/2014   Left-sided low back pain with left-sided sciatica 08/12/2014   Osteopenia 01/08/2014   Restless leg syndrome 08/20/2013   Constipation 08/20/2013   Postmenopausal estrogen deficiency 02/19/2013   Hypothyroidism 10/26/2010   Osteoarthritis 10/26/2010   S/P carpal tunnel release 10/26/2010   Hallux valgus 10/26/2010    PCP: Abbey Chatters  REFERRING PROVIDER: Standley Dakins  REFERRING DIAG:  [phone] (ICD-10-CM) - Post-traumatic osteoarthritis, left shoulder     THERAPY DIAG:  S/P reverse total shoulder arthroplasty, left  Muscle weakness of left arm  Stiffness of left shoulder, not elsewhere classified  Rationale for Evaluation and Treatment: Rehabilitation  ONSET DATE: 07/31/23  SUBJECTIVE:                                                                                                                                                                                      SUBJECTIVE STATEMENT: I had the surgery September 30th. I have been doing exercises at home. My doctor told me to come to therapy 1x a week for 4 weeks.   Hand dominance: Right  PERTINENT HISTORY:   PAIN:  Are you having pain? Yes: NPRS scale: 3/10 Pain location: L shoulder and in to bicep Pain description: slight pain, achy, can  be sharp if I reach certain ways  Aggravating factors: reaching up high  Relieving factors: pain meds  PRECAUTIONS: None  RED FLAGS: None   WEIGHT BEARING RESTRICTIONS: No  FALLS:  Has patient fallen in last 6 months?  No  LIVING ENVIRONMENT: Lives with: lives with their daughter Lives in: House/apartment Stairs: Yes: Internal: 15 steps; on right going up Has following equipment at home: None  OCCUPATION: Retired  PLOF: Independent and Independent with basic ADLs  PATIENT GOALS:get shoulder back to normal   NEXT MD VISIT:   OBJECTIVE:  Note: Objective measures were completed at Evaluation unless otherwise noted.  DIAGNOSTIC FINDINGS:  08/10/23 Imaging: AP and lateral views of the left shoulder show a well-fixed and well-positioned left reverse shoulder placement.  Status post left reverse TSA, doing well. Continue with home-based exercises. Recommend continued icing. She will use a sling when she is out and about but can remove the sling in the house and hug a pillow for support. Follow up in 4 weeks. No XRAYs on follow up   COGNITION: Overall cognitive status: Within functional limits for tasks assessed     SENSATION: WFL  POSTURE: Rounded shoulders  UPPER EXTREMITY ROM:   Active ROM Right eval Left eval  Shoulder flexion  135  Shoulder extension    Shoulder abduction  140  Shoulder adduction    Shoulder internal rotation  12  Shoulder external rotation  50  Elbow flexion    Elbow extension    Wrist flexion    Wrist extension    Wrist ulnar deviation    Wrist radial deviation    Wrist pronation    Wrist supination    (Blank rows = not tested)  UPPER EXTREMITY MMT:  MMT Right eval Left eval  Shoulder flexion  3+  Shoulder extension    Shoulder abduction  3+  Shoulder adduction    Shoulder internal rotation  4  Shoulder external rotation  4  Middle trapezius    Lower trapezius    Elbow flexion  5  Elbow extension  5  Wrist flexion    Wrist extension    Wrist ulnar deviation    Wrist radial deviation    Wrist pronation    Wrist supination    Grip strength (lbs)    (Blank rows = not tested)  SHOULDER SPECIAL TESTS: Impingement tests: Painful arc test:  positive   JOINT MOBILITY TESTING:  Able to get close to full PROM with shoulder flexion and abduction but has pain at end range holds. Limited in ER and mostly in IR.   PALPATION:  Some tightness and soreness around incision    TODAY'S TREATMENT:                                                                                                                                         DATE:  10/04/23 EVAL MET to  increase IR of L shoulder  PATIENT EDUCATION: Education details: POC and HEP Person educated: Patient Education method: Explanation Education comprehension: verbalized understanding  HOME EXERCISE PROGRAM: Access Code: [email or web address] URL: [email or web address] Date: 10/04/2023 Prepared by: Cassie Freer  Exercises - Standing Shoulder Internal Rotation AAROM with Dowel  - 2 x daily - 7 x weekly - 2 sets - 10 reps - Standing Bilateral Shoulder Internal Rotation AAROM with Dowel  - 2 x daily - 7 x weekly - 2 sets - 10 reps - Standing Shoulder Extension with Dowel  - 2 x daily - 7 x weekly - 2 sets - 10 reps - Shoulder Flexion Wall Slide with Towel  - 2 x daily - 7 x weekly - 2 sets - 10 reps - Standing Shoulder Row with Anchored Resistance  - 1 x daily - 7 x weekly - 2 sets - 10 reps - Shoulder extension with resistance - Neutral  - 1 x daily - 7 x weekly - 2 sets - 10 reps  ASSESSMENT:  CLINICAL IMPRESSION: Patient is a 77 y.o. female who was seen today for physical therapy evaluation and treatment for s/p reverse L TSA. She is currently 9 week out. She reports this is the first time she is getting PT since her surgery. However, she is doing really well with her recovery thus far. Patient has some ROM limitations, mostly with internal rotation. Her doctor recommended 1x/week for 4 weeks, but we agreed to a longer POC in case she needs needs more visits comes the end of the 4 weeks. Patient will benefit from skilled PT to address her L shoulder impairments to be able to  complete her ADLS and household chores with ease.  OBJECTIVE IMPAIRMENTS: decreased ROM, decreased strength, impaired UE functional use, and pain.   ACTIVITY LIMITATIONS: carrying, lifting, dressing, and reach over head  PARTICIPATION LIMITATIONS: cleaning, laundry, and yard work  [facility] POTENTIAL: Good  CLINICAL DECISION MAKING: Stable/uncomplicated  EVALUATION COMPLEXITY: Low   GOALS: Goals reviewed with patient? Yes  SHORT TERM GOALS: Target date: 11/01/23  Patient will be independent with initial HEP.  Baseline: given 10/04/23 Goal status: INITIAL   LONG TERM GOALS: Target date: 11/29/23  Patient will be independent with advanced/ongoing HEP to improve outcomes and carryover.  Goal status: INITIAL  2.  Patient will report 1/10 shoulder pain to improve QOL.  Baseline: 3/10 Goal status: INITIAL  3.  Patient to improve L shoulder AROM to WNL without pain provocation to allow for increased ease of ADLs.  Baseline: most limitation with IR  Goal status: INITIAL  4.  Patient will demonstrate improved functional UE strength as demonstrated by 5/5 MMT. Baseline: see chart Goal status: INITIAL  5.  Patient will be able to complete overhead activities and chores without pain an difficulty  Baseline: some c/o of placing things in cabinet Goal status: INITIAL   PLAN:  PT FREQUENCY: 1x/week  PT DURATION: 8 weeks  PLANNED INTERVENTIONS: 97110-Therapeutic exercises, 97530- Therapeutic activity, 97112- Neuromuscular re-education, 97535- Self Care, [id number]- Manual therapy, Patient/Family education, Joint mobilization, Cryotherapy, and Moist heat  PLAN FOR NEXT SESSION: PROM, IR ROM, strengthening, working on overhead activities- reaching on [employer], PT 10/04/2023, 10:58 AM

## 2023-10-04 ENCOUNTER — Ambulatory Visit: Payer: Medicare HMO | Attending: Physician Assistant

## 2023-10-04 DIAGNOSIS — M25512 Pain in left shoulder: Secondary | ICD-10-CM | POA: Insufficient documentation

## 2023-10-04 DIAGNOSIS — R6889 Other general symptoms and signs: Secondary | ICD-10-CM | POA: Diagnosis present

## 2023-10-04 DIAGNOSIS — M25612 Stiffness of left shoulder, not elsewhere classified: Secondary | ICD-10-CM | POA: Insufficient documentation

## 2023-10-04 DIAGNOSIS — Z96612 Presence of left artificial shoulder joint: Secondary | ICD-10-CM | POA: Insufficient documentation

## 2023-10-04 DIAGNOSIS — M6281 Muscle weakness (generalized): Secondary | ICD-10-CM | POA: Insufficient documentation

## 2023-10-04 DIAGNOSIS — G8929 Other chronic pain: Secondary | ICD-10-CM | POA: Insufficient documentation

## 2023-10-09 NOTE — Therapy (Signed)
OUTPATIENT PHYSICAL THERAPY SHOULDER TREATMENT   Patient Name: Colleen [name] MRN: [id number] DOB:1946/08/22, 77 y.o., female Today's Date: 10/09/2023  END OF SESSION:    Past Medical History:  Diagnosis Date   Allergic rhinitis    Anxiety    Arthritis    Arthritis of left wrist 10/13/2021   Atrophic vaginitis 06/25/2019   Biceps tendinitis 02/07/2022   Breast screening 10/26/2010   Last Assessment & Plan:  Formatting of this note might be different from the original. Will order a mammogram for this fall;  Follow up with me around Thanksgiving.   Callus of heel 02/19/2013   Cardiac murmur 10/09/2020   Colon polyps    adenomatous   Constipation 08/20/2013   Corns and callosities    Diverticulosis    DOE (dyspnea on exertion) 10/09/2020   Dysuria 06/03/2011   Last Assessment & Plan:  Formatting of this note might be different from the original. New, acute complaint today;  Will check urinalysis with her labs today;   Full thickness rotator cuff tear 02/07/2022   GERD (gastroesophageal reflux disease)    Glaucoma    H/O total hysterectomy 02/19/2013   Hallux valgus 10/26/2010   Last Assessment & Plan:  Formatting of this note might be different from the original. Had surgery with Dr Ernestine Conrad back in October;   Will have her follow up with him.   Heartburn 04/30/2014   Helicobacter pylori gastritis 08/12/2014   Hematoma of lower leg 06/12/2018   Hiatal hernia with gastroesophageal reflux 12/23/2014   Hypercholesteremia    no meds   Hypercholesterolemia 06/03/2011   Overview:  TC 205 (elevated) in 2010  Last Assessment & Plan:  Formatting of this note might be different from the original. Lab Results  Component Value Date   CHOL 205 12/09/2011   CHOL 233 06/03/2011   CHOL 205 05/26/2009   Lab Results  Component Value Date   HDL 82 12/09/2011   HDL 90 06/03/2011   HDL 75 05/26/2009   Lab Results  Component Value Date   LDLCALC 113 12/09/2011   LDLCALC 133 06/03/2011   LDLCA   Hypothyroidism     Left-sided low back pain with left-sided sciatica 08/12/2014   Neck pain 10/26/2010   Obesity    Osteoarthritis 10/26/2010   Osteoarthritis of acromioclavicular joint 02/07/2022   Osteopenia 01/08/2014   Overweight 10/26/2010   Postmenopausal estrogen deficiency 02/19/2013   Recurrent UTI 06/25/2019   Restless leg syndrome 08/20/2013   Routine general medical examination at a health care facility 02/19/2013   S/P carpal tunnel release 10/26/2010   Screen for colon cancer 12/16/2011   Last Assessment & Plan:  Formatting of this note might be different from the original. Refer to GI;   Screening for diabetes mellitus    Shoulder pain, right 12/16/2011   Last Assessment & Plan:  Formatting of this note might be different from the original. We have tried physical therapy;   We shot and reviewed films today;  She has  1)  Osteopenia 2)  Small spur at humeral head 3)  Some hypertrophic/lipping changes at the inferior glenoid rim;   I also injected it last visit;  She is on NSAIDS and tylenol;  Will trial voltaren gel;  Then get ortho opinion;   Skin spots-aging 07/08/2014   Strain of neck muscle 06/12/2018   Urge incontinence    Whiplash injury 06/26/2018   Past Surgical History:  Procedure Laterality Date   ABDOMINAL HYSTERECTOMY  1985  Dr Kathyrn Drown Right    Dr Ernestine Conrad   CARPAL TUNNEL RELEASE Bilateral 2010   Dr Christell Constant   CATARACT EXTRACTION Bilateral    CESAREAN SECTION  1983   1 time   COLONOSCOPY     ROTATOR CUFF REPAIR Left 03/10/2022   Dr Thomasena Edis   THYROIDECTOMY  1987   Dr Raechel Ache   TONSILLECTOMY  1951   Patient Active Problem List   Diagnosis Date Noted   Biceps tendinitis 02/07/2022   Full thickness rotator cuff tear 02/07/2022   Cardiac murmur 10/09/2020   Allergic rhinitis    Anxiety    Arthritis    Corns and callosities    Diverticulosis    Glaucoma    Hypercholesteremia    Obesity    Urge incontinence    Atrophic vaginitis 06/25/2019   Whiplash  injury 06/26/2018   Strain of neck muscle 06/12/2018   Hiatal hernia with gastroesophageal reflux 12/23/2014   Left-sided low back pain with left-sided sciatica 08/12/2014   Osteopenia 01/08/2014   Restless leg syndrome 08/20/2013   Constipation 08/20/2013   Postmenopausal estrogen deficiency 02/19/2013   Hypothyroidism 10/26/2010   Osteoarthritis 10/26/2010   S/P carpal tunnel release 10/26/2010   Hallux valgus 10/26/2010    PCP: Abbey Chatters  REFERRING PROVIDER: Standley Dakins  REFERRING DIAG:  [phone] (ICD-10-CM) - Post-traumatic osteoarthritis, left shoulder     THERAPY DIAG:  No diagnosis found.  Rationale for Evaluation and Treatment: Rehabilitation  ONSET DATE: 07/31/23  SUBJECTIVE:                                                                                                                                                                                      SUBJECTIVE STATEMENT: I had the surgery September 30th. I have been doing exercises at home. My doctor told me to come to therapy 1x a week for 4 weeks.   Hand dominance: Right  PERTINENT HISTORY:   PAIN:  Are you having pain? Yes: NPRS scale: 3/10 Pain location: L shoulder and in to bicep Pain description: slight pain, achy, can be sharp if I reach certain ways  Aggravating factors: reaching up high  Relieving factors: pain meds  PRECAUTIONS: None  RED FLAGS: None   WEIGHT BEARING RESTRICTIONS: No  FALLS:  Has patient fallen in last 6 months? No  LIVING ENVIRONMENT: Lives with: lives with their daughter Lives in: House/apartment Stairs: Yes: Internal: 15 steps; on right going up Has following equipment at home: None  OCCUPATION: Retired  PLOF: Independent and Independent with basic ADLs  PATIENT GOALS:get shoulder back to normal   NEXT MD VISIT:   OBJECTIVE:  Note: Objective  measures were completed at Evaluation unless otherwise noted.  DIAGNOSTIC FINDINGS:  08/10/23 Imaging:  AP and lateral views of the left shoulder show a well-fixed and well-positioned left reverse shoulder placement.  Status post left reverse TSA, doing well. Continue with home-based exercises. Recommend continued icing. She will use a sling when she is out and about but can remove the sling in the house and hug a pillow for support. Follow up in 4 weeks. No XRAYs on follow up   COGNITION: Overall cognitive status: Within functional limits for tasks assessed     SENSATION: WFL  POSTURE: Rounded shoulders  UPPER EXTREMITY ROM:   Active ROM Right eval Left eval  Shoulder flexion  135  Shoulder extension    Shoulder abduction  140  Shoulder adduction    Shoulder internal rotation  12  Shoulder external rotation  50  Elbow flexion    Elbow extension    Wrist flexion    Wrist extension    Wrist ulnar deviation    Wrist radial deviation    Wrist pronation    Wrist supination    (Blank rows = not tested)  UPPER EXTREMITY MMT:  MMT Right eval Left eval  Shoulder flexion  3+  Shoulder extension    Shoulder abduction  3+  Shoulder adduction    Shoulder internal rotation  4  Shoulder external rotation  4  Middle trapezius    Lower trapezius    Elbow flexion  5  Elbow extension  5  Wrist flexion    Wrist extension    Wrist ulnar deviation    Wrist radial deviation    Wrist pronation    Wrist supination    Grip strength (lbs)    (Blank rows = not tested)  SHOULDER SPECIAL TESTS: Impingement tests: Painful arc test: positive   JOINT MOBILITY TESTING:  Able to get close to full PROM with shoulder flexion and abduction but has pain at end range holds. Limited in ER and mostly in IR.   PALPATION:  Some tightness and soreness around incision    TODAY'S TREATMENT:                                                                                                                                         DATE:  10/11/23 UBE Wall slides Red band rows and ext ER/IR IR  stretch with towel  1# putting on and taking off of shelf PROM    10/04/23 EVAL MET to increase IR of L shoulder  PATIENT EDUCATION: Education details: POC and HEP Person educated: Patient Education method: Explanation Education comprehension: verbalized understanding  HOME EXERCISE PROGRAM: Access Code: [email or web address] URL: [email or web address] Date: 10/04/2023 Prepared by: Cassie Freer  Exercises - Standing Shoulder Internal Rotation AAROM with Dowel  - 2 x daily - 7 x weekly - 2 sets - 10 reps - Standing Bilateral Shoulder Internal Rotation AAROM with  Dowel  - 2 x daily - 7 x weekly - 2 sets - 10 reps - Standing Shoulder Extension with Dowel  - 2 x daily - 7 x weekly - 2 sets - 10 reps - Shoulder Flexion Wall Slide with Towel  - 2 x daily - 7 x weekly - 2 sets - 10 reps - Standing Shoulder Row with Anchored Resistance  - 1 x daily - 7 x weekly - 2 sets - 10 reps - Shoulder extension with resistance - Neutral  - 1 x daily - 7 x weekly - 2 sets - 10 reps  ASSESSMENT:  CLINICAL IMPRESSION: Patient is a 77 y.o. female who was seen today for physical therapy evaluation and treatment for s/p reverse L TSA. She is currently 9 week out. She reports this is the first time she is getting PT since her surgery. However, she is doing really well with her recovery thus far. Patient has some ROM limitations, mostly with internal rotation. Her doctor recommended 1x/week for 4 weeks, but we agreed to a longer POC in case she needs needs more visits comes the end of the 4 weeks. Patient will benefit from skilled PT to address her L shoulder impairments to be able to complete her ADLS and household chores with ease.  OBJECTIVE IMPAIRMENTS: decreased ROM, decreased strength, impaired UE functional use, and pain.   ACTIVITY LIMITATIONS: carrying, lifting, dressing, and reach over head  PARTICIPATION LIMITATIONS: cleaning, laundry, and yard work  [facility] POTENTIAL: Good  CLINICAL DECISION  MAKING: Stable/uncomplicated  EVALUATION COMPLEXITY: Low   GOALS: Goals reviewed with patient? Yes  SHORT TERM GOALS: Target date: 11/01/23  Patient will be independent with initial HEP.  Baseline: given 10/04/23 Goal status: INITIAL   LONG TERM GOALS: Target date: 11/29/23  Patient will be independent with advanced/ongoing HEP to improve outcomes and carryover.  Goal status: INITIAL  2.  Patient will report 1/10 shoulder pain to improve QOL.  Baseline: 3/10 Goal status: INITIAL  3.  Patient to improve L shoulder AROM to WNL without pain provocation to allow for increased ease of ADLs.  Baseline: most limitation with IR  Goal status: INITIAL  4.  Patient will demonstrate improved functional UE strength as demonstrated by 5/5 MMT. Baseline: see chart Goal status: INITIAL  5.  Patient will be able to complete overhead activities and chores without pain an difficulty  Baseline: some c/o of placing things in cabinet Goal status: INITIAL   PLAN:  PT FREQUENCY: 1x/week  PT DURATION: 8 weeks  PLANNED INTERVENTIONS: 97110-Therapeutic exercises, 97530- Therapeutic activity, 97112- Neuromuscular re-education, 97535- Self Care, 16109- Manual therapy, Patient/Family education, Joint mobilization, Cryotherapy, and Moist heat  PLAN FOR NEXT SESSION: PROM, IR ROM, strengthening, working on overhead activities- reaching [employer], PT 10/09/2023, 7:58 AM

## 2023-10-11 ENCOUNTER — Ambulatory Visit: Payer: Medicare HMO

## 2023-10-11 DIAGNOSIS — G8929 Other chronic pain: Secondary | ICD-10-CM | POA: Diagnosis not present

## 2023-10-11 DIAGNOSIS — M47816 Spondylosis without myelopathy or radiculopathy, lumbar region: Secondary | ICD-10-CM | POA: Diagnosis not present

## 2023-10-11 DIAGNOSIS — Z96612 Presence of left artificial shoulder joint: Secondary | ICD-10-CM | POA: Diagnosis not present

## 2023-10-11 DIAGNOSIS — M6281 Muscle weakness (generalized): Secondary | ICD-10-CM

## 2023-10-11 DIAGNOSIS — M25512 Pain in left shoulder: Secondary | ICD-10-CM | POA: Diagnosis not present

## 2023-10-11 DIAGNOSIS — M545 Low back pain, unspecified: Secondary | ICD-10-CM | POA: Diagnosis not present

## 2023-10-11 DIAGNOSIS — M25612 Stiffness of left shoulder, not elsewhere classified: Secondary | ICD-10-CM

## 2023-10-11 DIAGNOSIS — Z79891 Long term (current) use of opiate analgesic: Secondary | ICD-10-CM | POA: Diagnosis not present

## 2023-10-18 ENCOUNTER — Ambulatory Visit: Payer: Medicare HMO | Admitting: Physical Therapy

## 2023-10-24 ENCOUNTER — Encounter: Payer: Self-pay | Admitting: Physical Therapy

## 2023-10-24 ENCOUNTER — Ambulatory Visit: Payer: Medicare HMO | Admitting: Physical Therapy

## 2023-10-24 DIAGNOSIS — M6281 Muscle weakness (generalized): Secondary | ICD-10-CM

## 2023-10-24 DIAGNOSIS — G8929 Other chronic pain: Secondary | ICD-10-CM

## 2023-10-24 DIAGNOSIS — Z96612 Presence of left artificial shoulder joint: Secondary | ICD-10-CM | POA: Diagnosis not present

## 2023-10-24 DIAGNOSIS — M25612 Stiffness of left shoulder, not elsewhere classified: Secondary | ICD-10-CM

## 2023-10-24 DIAGNOSIS — R6889 Other general symptoms and signs: Secondary | ICD-10-CM

## 2023-10-24 NOTE — Therapy (Signed)
OUTPATIENT PHYSICAL THERAPY SHOULDER TREATMENT   Patient Name: Colleen [name] MRN: [id number] DOB:[birth year], 77 y.o., female Today's Date: 10/24/2023  END OF SESSION:  PT End of Session - 10/24/23 1019     Visit Number 3    Date for PT Re-Evaluation 11/29/23    PT Start Time 1017    PT Stop Time 1100    PT Time Calculation (min) 43 min    Activity Tolerance Patient tolerated treatment well    Behavior During Therapy [facility] for tasks assessed/performed              Past Medical History:  Diagnosis Date   Allergic rhinitis    Anxiety    Arthritis    Arthritis of left wrist 10/13/2021   Atrophic vaginitis 06/25/2019   Biceps tendinitis 02/07/2022   Breast screening 10/26/2010   Last Assessment & Plan:  Formatting of this note might be different from the original. Will order a mammogram for this fall;  Follow up with me around Thanksgiving.   Callus of heel 02/19/2013   Cardiac murmur 10/09/2020   Colon polyps    adenomatous   Constipation 08/20/2013   Corns and callosities    Diverticulosis    DOE (dyspnea on exertion) 10/09/2020   Dysuria 06/03/2011   Last Assessment & Plan:  Formatting of this note might be different from the original. New, acute complaint today;  Will check urinalysis with her labs today;   Full thickness rotator cuff tear 02/07/2022   GERD (gastroesophageal reflux disease)    Glaucoma    H/O total hysterectomy 02/19/2013   Hallux valgus 10/26/2010   Last Assessment & Plan:  Formatting of this note might be different from the original. Had surgery with Dr Ernestine Conrad back in October;   Will have her follow up with him.   Heartburn 04/30/2014   Helicobacter pylori gastritis 08/12/2014   Hematoma of lower leg 06/12/2018   Hiatal hernia with gastroesophageal reflux 12/23/2014   Hypercholesteremia    no meds   Hypercholesterolemia 06/03/2011   Overview:  TC 205 (elevated) in 2010  Last Assessment & Plan:  Formatting of this note might be different from  the original. Lab Results  Component Value Date   CHOL 205 12/09/2011   CHOL 233 06/03/2011   CHOL 205 05/26/2009   Lab Results  Component Value Date   HDL 82 12/09/2011   HDL 90 06/03/2011   HDL 75 05/26/2009   Lab Results  Component Value Date   LDLCALC 113 12/09/2011   LDLCALC 133 06/03/2011   LDLCA   Hypothyroidism    Left-sided low back pain with left-sided sciatica 08/12/2014   Neck pain 10/26/2010   Obesity    Osteoarthritis 10/26/2010   Osteoarthritis of acromioclavicular joint 02/07/2022   Osteopenia 01/08/2014   Overweight 10/26/2010   Postmenopausal estrogen deficiency 02/19/2013   Recurrent UTI 06/25/2019   Restless leg syndrome 08/20/2013   Routine general medical examination at a health care facility 02/19/2013   S/P carpal tunnel release 10/26/2010   Screen for colon cancer 12/16/2011   Last Assessment & Plan:  Formatting of this note might be different from the original. Refer to GI;   Screening for diabetes mellitus    Shoulder pain, right 12/16/2011   Last Assessment & Plan:  Formatting of this note might be different from the original. We have tried physical therapy;   We shot and reviewed films today;  She has  1)  Osteopenia 2)  Small spur at humeral  head 3)  Some hypertrophic/lipping changes at the inferior glenoid rim;   I also injected it last visit;  She is on NSAIDS and tylenol;  Will trial voltaren gel;  Then get ortho opinion;   Skin spots-aging 07/08/2014   Strain of neck muscle 06/12/2018   Urge incontinence    Whiplash injury 06/26/2018   Past Surgical History:  Procedure Laterality Date   ABDOMINAL HYSTERECTOMY  1985   Dr Kathyrn Drown Right    Dr Ernestine Conrad   CARPAL TUNNEL RELEASE Bilateral 2010   Dr Christell Constant   CATARACT EXTRACTION Bilateral    CESAREAN SECTION  1983   1 time   COLONOSCOPY     ROTATOR CUFF REPAIR Left 03/10/2022   Dr Thomasena Edis   THYROIDECTOMY  1987   Dr Raechel Ache   TONSILLECTOMY  1951   Patient Active Problem List   Diagnosis Date Noted    Biceps tendinitis 02/07/2022   Full thickness rotator cuff tear 02/07/2022   Cardiac murmur 10/09/2020   Allergic rhinitis    Anxiety    Arthritis    Corns and callosities    Diverticulosis    Glaucoma    Hypercholesteremia    Obesity    Urge incontinence    Atrophic vaginitis 06/25/2019   Whiplash injury 06/26/2018   Strain of neck muscle 06/12/2018   Hiatal hernia with gastroesophageal reflux 12/23/2014   Left-sided low back pain with left-sided sciatica 08/12/2014   Osteopenia 01/08/2014   Restless leg syndrome 08/20/2013   Constipation 08/20/2013   Postmenopausal estrogen deficiency 02/19/2013   Hypothyroidism 10/26/2010   Osteoarthritis 10/26/2010   S/P carpal tunnel release 10/26/2010   Hallux valgus 10/26/2010    PCP: Abbey Chatters  REFERRING PROVIDER: Standley Dakins  REFERRING DIAG:  [phone] (ICD-10-CM) - Post-traumatic osteoarthritis, left shoulder     THERAPY DIAG:  S/P reverse total shoulder arthroplasty, left  Stiffness of left shoulder, not elsewhere classified  Chronic left shoulder pain  Muscle weakness of left arm  Decreased functional activity tolerance  Rationale for Evaluation and Treatment: Rehabilitation  ONSET DATE: 07/31/23  SUBJECTIVE:                                                                                                                                                                                      SUBJECTIVE STATEMENT: "Good" No t steady pain  Hand dominance: Right  PERTINENT HISTORY:   PAIN:  Are you having pain? Yes: NPRS scale: 0/10 Pain location: L shoulder and in to bicep Pain description: slight pain, achy, can be sharp if I reach certain ways  Aggravating factors: reaching up high  Relieving factors: pain meds  PRECAUTIONS: None  RED FLAGS: None   WEIGHT BEARING RESTRICTIONS: No  FALLS:  Has patient fallen in last 6 months? No  LIVING ENVIRONMENT: Lives with: lives with their daughter Lives  in: House/apartment Stairs: Yes: Internal: 15 steps; on right going up Has following equipment at home: None  OCCUPATION: Retired  PLOF: Independent and Independent with basic ADLs  PATIENT GOALS:get shoulder back to normal   NEXT MD VISIT:   OBJECTIVE:  Note: Objective measures were completed at Evaluation unless otherwise noted.  DIAGNOSTIC FINDINGS:  08/10/23 Imaging: AP and lateral views of the left shoulder show a well-fixed and well-positioned left reverse shoulder placement.  Status post left reverse TSA, doing well. Continue with home-based exercises. Recommend continued icing. She will use a sling when she is out and about but can remove the sling in the house and hug a pillow for support. Follow up in 4 weeks. No XRAYs on follow up   COGNITION: Overall cognitive status: Within functional limits for tasks assessed     SENSATION: WFL  POSTURE: Rounded shoulders  UPPER EXTREMITY ROM:   Active ROM Right eval Left eval Left 10/12/23  Shoulder flexion  135 135  Shoulder extension     Shoulder abduction  140 131  Shoulder adduction     Shoulder internal rotation  12 25  Shoulder external rotation  50 73  Elbow flexion     Elbow extension     Wrist flexion     Wrist extension     Wrist ulnar deviation     Wrist radial deviation     Wrist pronation     Wrist supination     (Blank rows = not tested)  UPPER EXTREMITY MMT:  MMT Left eval Left 10/24/23  Shoulder flexion 3+ 4+  Shoulder extension    Shoulder abduction 3+ 4  Shoulder adduction    Shoulder internal rotation 4 4  Shoulder external rotation 4 4  Middle trapezius    Lower trapezius    Elbow flexion 5   Elbow extension 5   Wrist flexion    Wrist extension    Wrist ulnar deviation    Wrist radial deviation    Wrist pronation    Wrist supination    Grip strength (lbs)    (Blank rows = not tested)  SHOULDER SPECIAL TESTS: Impingement tests: Painful arc test: positive   JOINT MOBILITY  TESTING:  Able to get close to full PROM with shoulder flexion and abduction but has pain at end range holds. Limited in ER and mostly in IR.   PALPATION:  Some tightness and soreness around incision    TODAY'S TREATMENT:                                                                                                                                         DATE:  10/24/23 AAROM 1lb WaTE flex, Ext, IR  up back Shoulder abd  CHECKED GOALS UBE L2 x 2 min each Rows and Ext green 2x10 LUE AROM ER/IR 2x10 LUE PROM w/ end range holds   10/11/23 UBE L1 x27mins each way  Wall slides flexion and abd x10  Red band rows and ext 2x10  ER/IR red 2x10  IR stretch with towel x10 3s holds 1# and 2# putting on and taking off of shelf PROM to L shoulder to end ranges MET to L shoulder pushing into ER and moving into further ranges for IR    10/04/23 EVAL MET to increase IR of L shoulder  PATIENT EDUCATION: Education details: POC and HEP Person educated: Patient Education method: Explanation Education comprehension: verbalized understanding  HOME EXERCISE PROGRAM: Access Code: [email or web address] URL: [email or web address] Date: 10/11/2023 Prepared by: Cassie Freer  Exercises - Standing Shoulder Internal Rotation AAROM with Dowel  - 2 x daily - 7 x weekly - 2 sets - 10 reps - Standing Bilateral Shoulder Internal Rotation AAROM with Dowel  - 2 x daily - 7 x weekly - 2 sets - 10 reps - Standing Shoulder Extension with Dowel  - 2 x daily - 7 x weekly - 2 sets - 10 reps - Shoulder Flexion Wall Slide with Towel  - 2 x daily - 7 x weekly - 2 sets - 10 reps - Standing Shoulder Row with Anchored Resistance  - 1 x daily - 7 x weekly - 2 sets - 10 reps - Shoulder extension with resistance - Neutral  - 1 x daily - 7 x weekly - 2 sets - 10 reps - Shoulder External Rotation with Anchored Resistance  - 1 x daily - 7 x weekly - 2 sets - 10 reps - Shoulder Internal Rotation with Resistance  - 1 x  daily - 7 x weekly - 2 sets - 10 reps - Standing Shoulder Internal Rotation Stretch with Towel  - 1 x daily - 7 x weekly - 5 reps - 15-30 hold  ASSESSMENT:  CLINICAL IMPRESSION: Patient is a 77 y.o. female who was seen today for physical therapy treatment for s/p reverse L TSA. She is currently 12 weeks out. She enters doing well reporting that this was her last visit. I gave pt th option to continue due to some ROM limitations but she reported that she could put th plat in the microwave and she was fine. L shoulder ER seated in neutral was very limited with ROM and strength. She reports being pleased wit her current factional status and reports no limitations.  OBJECTIVE IMPAIRMENTS: decreased ROM, decreased strength, impaired UE functional use, and pain.   ACTIVITY LIMITATIONS: carrying, lifting, dressing, and reach over head  PARTICIPATION LIMITATIONS: cleaning, laundry, and yard work  [facility] POTENTIAL: Good  CLINICAL DECISION MAKING: Stable/uncomplicated  EVALUATION COMPLEXITY: Low   GOALS: Goals reviewed with patient? Yes  SHORT TERM GOALS: Target date: 11/01/23  Patient will be independent with initial HEP.  Baseline: given 10/04/23 Goal status: Met 10/24/23   LONG TERM GOALS: Target date: 11/29/23  Patient will be independent with advanced/ongoing HEP to improve outcomes and carryover.  Goal status: INITIAL  2.  Patient will report 1/10 shoulder pain to improve QOL.  Baseline: 3/10 Goal status: Met 10/24/23  3.  Patient to improve L shoulder AROM to WNL without pain provocation to allow for increased ease of ADLs.  Baseline: most limitation with IR  Goal status: Progressing 10/24/23  4.  Patient will demonstrate improved functional UE strength as demonstrated  by 5/5 MMT. Baseline: see chart Goal status: Progressing 10/24/23  5.  Patient will be able to complete overhead activities and chores without pain an difficulty  Baseline: some c/o of placing things in  cabinet Goal status: No problem with what little bit she does    PLAN:  PT FREQUENCY: 1x/week  PT DURATION: 8 weeks  PLANNED INTERVENTIONS: 97110-Therapeutic exercises, 97530- Therapeutic activity, 97112- Neuromuscular re-education, 97535- Self Care, 70623- Manual therapy, Patient/Family education, Joint mobilization, Cryotherapy, and Moist heat  PLAN FOR NEXT SESSION: D/C PT  PHYSICAL THERAPY DISCHARGE SUMMARY  Visits from Start of Care: 3   Patient agrees to discharge. Patient goals were not met. Patient is being discharged due to being pleased with the current functional level.   Grayce Sessions, PTA 10/24/2023, 10:19 AM

## 2023-11-09 DIAGNOSIS — M25512 Pain in left shoulder: Secondary | ICD-10-CM | POA: Diagnosis not present

## 2023-11-09 DIAGNOSIS — Z79891 Long term (current) use of opiate analgesic: Secondary | ICD-10-CM | POA: Diagnosis not present

## 2023-12-05 ENCOUNTER — Encounter: Payer: Medicare HMO | Admitting: Nurse Practitioner

## 2023-12-14 DIAGNOSIS — M25512 Pain in left shoulder: Secondary | ICD-10-CM | POA: Diagnosis not present

## 2023-12-14 DIAGNOSIS — Z79891 Long term (current) use of opiate analgesic: Secondary | ICD-10-CM | POA: Diagnosis not present

## 2023-12-27 ENCOUNTER — Other Ambulatory Visit: Payer: Self-pay | Admitting: Nurse Practitioner

## 2023-12-28 ENCOUNTER — Other Ambulatory Visit: Payer: Self-pay | Admitting: Nurse Practitioner

## 2023-12-28 ENCOUNTER — Ambulatory Visit: Payer: Medicare HMO | Admitting: Nurse Practitioner

## 2023-12-28 ENCOUNTER — Encounter: Payer: Self-pay | Admitting: Nurse Practitioner

## 2023-12-28 DIAGNOSIS — Z0000 Encounter for general adult medical examination without abnormal findings: Secondary | ICD-10-CM

## 2023-12-28 DIAGNOSIS — E2839 Other primary ovarian failure: Secondary | ICD-10-CM | POA: Diagnosis not present

## 2023-12-28 DIAGNOSIS — E039 Hypothyroidism, unspecified: Secondary | ICD-10-CM

## 2023-12-28 DIAGNOSIS — M810 Age-related osteoporosis without current pathological fracture: Secondary | ICD-10-CM

## 2023-12-28 DIAGNOSIS — I10 Essential (primary) hypertension: Secondary | ICD-10-CM

## 2023-12-28 NOTE — Patient Instructions (Addendum)
  Colleen [name] , Thank you for taking time to come for your Medicare Wellness Visit. I appreciate your ongoing commitment to your health goals. Please review the following plan we discussed and let me know if I can assist you in the future.   To call to get bone density scheduled    This is a list of the screening recommended for you and due dates:  Health Maintenance  Topic Date Due   DEXA scan (bone density measurement)  05/13/2024   Medicare Annual Wellness Visit  12/27/2024   DTaP/Tdap/Td vaccine (3 - Td or Tdap) 10/21/2025   Pneumonia Vaccine  Completed   Flu Shot  Completed   COVID-19 Vaccine  Completed   Hepatitis C Screening  Completed   Zoster (Shingles) Vaccine  Completed   HPV Vaccine  Aged Out   Colon Cancer Screening  Discontinued

## 2023-12-28 NOTE — Progress Notes (Signed)
 Subjective:   Colleen [name] is a 78 y.o. female who presents for Medicare Annual (Subsequent) preventive examination.  Visit Complete: Virtual I connected with  Rockie Neighbours on 12/28/23 by a video and audio enabled telemedicine application and verified that I am speaking with the correct person using two identifiers.  Patient Location: Home  Provider Location: Office/Clinic  I discussed the limitations of evaluation and management by telemedicine. The patient expressed understanding and agreed to proceed.  Vital Signs: Because this visit was a virtual/telehealth visit, some criteria may be missing or patient reported. Any vitals not documented were not able to be obtained and vitals that have been documented are patient reported.    Cardiac Risk Factors include: advanced age (>38men, >9 women);obesity (BMI >30kg/m2);sedentary lifestyle;dyslipidemia;hypertension     Objective:    Today's Vitals   12/28/23 1130  PainSc: 4    There is no height or weight on file to calculate BMI.     12/28/2023   11:15 AM 09/04/2023    7:58 AM 06/22/2023   12:54 PM 11/29/2022    8:31 AM 08/22/2022    9:54 AM 02/18/2022    8:23 AM 02/17/2022    4:36 PM  Advanced Directives  Does Patient Have a Medical Advance Directive? Yes Yes Yes Yes Yes Yes Yes  Type of Estate agent of [place];Out of facility DNR (pink MOST or yellow form) Healthcare Power of [place];Out of facility DNR (pink MOST or yellow form) Healthcare Power of [place];Living will Healthcare Power of [place];Out of facility DNR (pink MOST or yellow form) Healthcare Power of [employer] of [place];Out of facility DNR (pink MOST or yellow form) Healthcare Power of [place];Out of facility DNR (pink MOST or yellow form)  Does patient want to make changes to medical advance directive? No - Patient declined No - Patient declined No - Patient declined No - Patient declined No - Patient declined  No -  Patient declined  Copy of Healthcare Power of Attorney in Chart? Yes - validated most recent copy scanned in chart (See row information) Yes - validated most recent copy scanned in chart (See row information) Yes - validated most recent copy scanned in chart (See row information) Yes - validated most recent copy scanned in chart (See row information) Yes - validated most recent copy scanned in chart (See row information) Yes - validated most recent copy scanned in chart (See row information) Yes - validated most recent copy scanned in chart (See row information)  Pre-existing out of facility DNR order (yellow form or pink MOST form)  Pink MOST form placed in chart (order not valid for inpatient use)  Pink MOST form placed in chart (order not valid for inpatient use)  Pink MOST form placed in chart (order not valid for inpatient use) Pink MOST form placed in chart (order not valid for inpatient use)    Current Medications (verified) Outpatient Encounter Medications as of 12/28/2023  Medication Sig   Ascorbic Acid (VITAMIN C PO) Take 1 tablet by mouth daily.   aspirin EC 81 MG tablet Take 81 mg by mouth every other day. Swallow whole.   atorvastatin (LIPITOR) 10 MG tablet TAKE 0.5 TABLETS (5 MG TOTAL) BY MOUTH DAILY. E78.00   CALCIUM-VITAMIN D PO Take 1 capsule by mouth daily.   CRANBERRY PO Take 1 tablet by mouth daily.   cyclobenzaprine (FLEXERIL) 10 MG tablet Take 10 mg by mouth at bedtime.   denosumab (PROLIA) 60 MG/ML SOSY injection Inject 60 mg  into the skin every 6 (six) months.   diclofenac Sodium (VOLTAREN) 1 % GEL APPLY 2 GRAMS TOPICALLY 4 (FOUR) TIMES DAILY AS NEEDED.   hydrochlorothiazide (HYDRODIURIL) 12.5 MG tablet TAKE 0.5 TABLETS BY MOUTH DAILY.   HYDROcodone-acetaminophen (NORCO/VICODIN) 5-325 MG tablet Take 1 tablet by mouth every 4 (four) hours.   hydroxypropyl methylcellulose / hypromellose (ISOPTO TEARS / GONIOVISC) 2.5 % ophthalmic solution Place 1 drop into both eyes as needed  for dry eyes.    KRILL OIL OMEGA-3 PO Take 1 tablet by mouth daily.   levothyroxine (SYNTHROID) 25 MCG tablet TAKE 1 TABLET BY MOUTH EVERY DAY BEFORE BREAKFAST   lidocaine (LIDODERM) 5 % Place 1 patch onto the skin daily. Remove & Discard patch within 12 hours or as directed by MD   methocarbamol (ROBAXIN) 500 MG tablet Take 500 mg by mouth daily. Every morning   Multiple Vitamin (MULTIVITAMIN WITH MINERALS) TABS Take 1 tablet by mouth daily.   omeprazole (PRILOSEC) 40 MG capsule TAKE 1 CAPSULE (40 MG TOTAL) BY MOUTH DAILY.   HYDROcodone-acetaminophen (NORCO/VICODIN) 5-325 MG tablet Take 1 tablet by mouth every 8 (eight) hours as needed. (Patient not taking: Reported on 12/28/2023)   Facility-Administered Encounter Medications as of 12/28/2023  Medication   [START ON 03/05/2024] denosumab (PROLIA) injection 60 mg    Allergies (verified) Patient has no known allergies.   History: Past Medical History:  Diagnosis Date   Allergic rhinitis    Anxiety    Arthritis    Arthritis of left wrist 10/13/2021   Atrophic vaginitis 06/25/2019   Biceps tendinitis 02/07/2022   Breast screening 10/26/2010   Last Assessment & Plan:  Formatting of this note might be different from the original. Will order a mammogram for this fall;  Follow up with me around Thanksgiving.   Callus of heel 02/19/2013   Cardiac murmur 10/09/2020   Colon polyps    adenomatous   Constipation 08/20/2013   Corns and callosities    Diverticulosis    DOE (dyspnea on exertion) 10/09/2020   Dysuria 06/03/2011   Last Assessment & Plan:  Formatting of this note might be different from the original. New, acute complaint today;  Will check urinalysis with her labs today;   Full thickness rotator cuff tear 02/07/2022   GERD (gastroesophageal reflux disease)    Glaucoma    H/O total hysterectomy 02/19/2013   Hallux valgus 10/26/2010   Last Assessment & Plan:  Formatting of this note might be different from the original. Had surgery  with Dr Ernestine Conrad back in October;   Will have her follow up with him.   Heartburn 04/30/2014   Helicobacter pylori gastritis 08/12/2014   Hematoma of lower leg 06/12/2018   Hiatal hernia with gastroesophageal reflux 12/23/2014   Hypercholesteremia    no meds   Hypercholesterolemia 06/03/2011   Overview:  TC 205 (elevated) in 2010  Last Assessment & Plan:  Formatting of this note might be different from the original. Lab Results  Component Value Date   CHOL 205 12/09/2011   CHOL 233 06/03/2011   CHOL 205 05/26/2009   Lab Results  Component Value Date   HDL 82 12/09/2011   HDL 90 06/03/2011   HDL 75 05/26/2009   Lab Results  Component Value Date   LDLCALC 113 12/09/2011   LDLCALC 133 06/03/2011   LDLCA   Hypothyroidism    Left-sided low back pain with left-sided sciatica 08/12/2014   Neck pain 10/26/2010   Obesity    Osteoarthritis 10/26/2010  Osteoarthritis of acromioclavicular joint 02/07/2022   Osteopenia 01/08/2014   Overweight 10/26/2010   Postmenopausal estrogen deficiency 02/19/2013   Recurrent UTI 06/25/2019   Restless leg syndrome 08/20/2013   Routine general medical examination at a health care facility 02/19/2013   S/P carpal tunnel release 10/26/2010   Screen for colon cancer 12/16/2011   Last Assessment & Plan:  Formatting of this note might be different from the original. Refer to GI;   Screening for diabetes mellitus    Shoulder pain, right 12/16/2011   Last Assessment & Plan:  Formatting of this note might be different from the original. We have tried physical therapy;   We shot and reviewed films today;  She has  1)  Osteopenia 2)  Small spur at humeral head 3)  Some hypertrophic/lipping changes at the inferior glenoid rim;   I also injected it last visit;  She is on NSAIDS and tylenol;  Will trial voltaren gel;  Then get ortho opinion;   Skin spots-aging 07/08/2014   Strain of neck muscle 06/12/2018   Urge incontinence    Whiplash injury 06/26/2018   Past Surgical History:  Procedure  Laterality Date   ABDOMINAL HYSTERECTOMY  1985   Dr Kathyrn Drown Right    Dr Ernestine Conrad   CARPAL TUNNEL RELEASE Bilateral 2010   Dr Christell Constant   CATARACT EXTRACTION Bilateral    CESAREAN SECTION  1983   1 time   COLONOSCOPY     ROTATOR CUFF REPAIR Left 03/10/2022   Dr Thomasena Edis   THYROIDECTOMY  1987   Dr Raechel Ache   TONSILLECTOMY  1951   Family History  Problem Relation Age of Onset   Leukemia Mother 46   Lung cancer Father    Diabetes Brother    COPD Brother    Lung cancer Brother    Diabetes Maternal Grandmother    Cancer Maternal Grandfather        unknown type   Breast cancer Maternal Aunt    Colon polyps Neg Hx    Rectal cancer Neg Hx    Social History   Socioeconomic History   Marital status: Widowed    Spouse name: Not on file   Number of children: 1   Years of education: Not on file   Highest education level: 12th grade  Occupational History   Occupation: retired  Tobacco Use   Smoking status: Former    Current packs/day: 0.00    Types: Cigarettes    Quit date: 10/31/1986    Years since quitting: 37.1   Smokeless tobacco: Never  Vaping Use   Vaping status: Never Used  Substance and Sexual Activity   Alcohol use: No    Alcohol/week: 0.0 standard drinks of alcohol   Drug use: No   Sexual activity: Never  Other Topics Concern   Not on file  Social History Narrative   Not on file   Social Drivers of Health   Financial Resource Strain: Low Risk  (12/04/2023)   Overall Financial Resource Strain (CARDIA)    Difficulty of Paying Living Expenses: Not hard at all  Food Insecurity: No Food Insecurity (12/04/2023)   Hunger Vital Sign    Worried About Running Out of Food in the Last Year: Never true    Ran Out of Food in the Last Year: Never true  Transportation Needs: No Transportation Needs (12/04/2023)   PRAPARE - Administrator, Civil Service (Medical): No    Lack of Transportation (Non-Medical): No  Physical  Activity: Unknown (12/04/2023)    Exercise Vital Sign    Days of Exercise per Week: 0 days    Minutes of Exercise per Session: Not on file  Stress: No Stress Concern Present (12/04/2023)   [employer] of Occupational Health - Occupational Stress Questionnaire    Feeling of Stress : Not at all  Social Connections: Moderately Isolated (12/04/2023)   Social Connection and Isolation Panel [NHANES]    Frequency of Communication with Friends and Family: More than three times a week    Frequency of Social Gatherings with Friends and Family: Once a week    Attends Religious Services: More than 4 times per year    Active Member of [employer] or Organizations: No    Attends Banker Meetings: Not on file    Marital Status: Widowed    Tobacco Counseling Counseling given: Not Answered   Clinical Intake:  Pre-visit preparation completed: Yes  Pain : 0-10 Pain Score: 4  Pain Type: Chronic pain Pain Location: Back     BMI - recorded: 30.9 Nutritional Status: BMI > 30  Obese Diabetes: No  How often do you need to have someone help you when you read instructions, pamphlets, or other written materials from your doctor or pharmacy?: 3 - Sometimes         Activities of Daily Living    12/28/2023   11:16 AM 12/04/2023   11:52 AM  In your present state of health, do you have any difficulty performing the following activities:  Hearing? 0 0   Vision? 0 0   Difficulty concentrating or making decisions? 0 0   Walking or climbing stairs? 0 0   Dressing or bathing? 0 0   Doing errands, shopping? 0 0   Preparing Food and eating ? N N   Using the Toilet? N N   In the past six months, have you accidently leaked urine? N Y   Do you have problems with loss of bowel control? N N   Managing your Medications? N N   Managing your Finances? N N   Housekeeping or managing your Housekeeping? Daryel Gerald      Proxy-reported    Patient Care Team: Sharon Seller, NP as PCP - General (Nurse Practitioner)  Indicate any  recent Medical Services you may have received from other than [facility] providers in the past year (date may be approximate).     Assessment:   This is a routine wellness examination for [facility]  Hearing/Vision screen Vision Screening - Comments:: [facility] in [place]. Dr. Aletta Edouard Last Exam: ?   Goals Addressed   None    Depression Screen    12/28/2023   11:21 AM 09/04/2023    8:47 AM 11/29/2022    8:36 AM 07/07/2022   10:03 AM 02/18/2022    8:23 AM 12/20/2021    8:49 AM 11/11/2021   10:39 AM  PHQ 2/9 Scores  PHQ - 2 Score 0 0 0 0 0 0 0    Fall Risk    12/28/2023   11:21 AM 12/04/2023   11:52 AM 09/04/2023    8:47 AM 06/22/2023   12:54 PM 02/24/2023    9:48 AM  Fall Risk   Falls in the past year? 0 0  0 0 0  Number falls in past yr: 0  0 0 0  Injury with Fall? 0  0 0 0  Risk for fall due to : No Fall Risks  No Fall Risks No  Fall Risks No Fall Risks  Follow up Falls evaluation completed  Falls evaluation completed Falls evaluation completed;Education provided;Falls prevention discussed Falls evaluation completed     Proxy-reported    MEDICARE RISK AT HOME: Medicare Risk at Home Any stairs in or around the home?: (Proxy-Rptd) Yes If so, are there any without handrails?: (Proxy-Rptd) No Home free of loose throw rugs in walkways, pet beds, electrical cords, etc?: (Proxy-Rptd) Yes Adequate lighting in your home to reduce risk of falls?: (Proxy-Rptd) Yes Life alert?: (Proxy-Rptd) No Use of a cane, walker or w/c?: (Proxy-Rptd) No Grab bars in the bathroom?: (Proxy-Rptd) No Shower chair or bench in shower?: (Proxy-Rptd) No Elevated toilet seat or a handicapped toilet?: (Proxy-Rptd) No  TIMED UP AND GO:  Was the test performed?  No    Cognitive Function:    10/29/2018    8:40 AM 10/26/2017    8:41 AM 10/20/2016    9:50 AM 10/22/2015   10:48 AM 07/08/2014    8:30 AM  MMSE - Mini Mental State Exam  Not completed:    --   Orientation to time 5 4 5 5 5   Orientation to Place 5 5 5  5 5   Registration 3 3 3 3 3   Attention/ Calculation 5 5 5 5 5   Recall 2 2 3 3 2   Language- name 2 objects 2 2 2 2 2   Language- repeat 1 1 1 1 1   Language- follow 3 step command 3 3 3 2 3   Language- read & follow direction 1 1 1 1 1   Write a sentence 1 1 1 1 1   Copy design 1 1 1 1 1   Total score 29 28 30 29 29         12/28/2023   11:21 AM 11/29/2022    8:38 AM 11/11/2021   10:44 AM 11/05/2020    9:20 AM 11/04/2019    8:40 AM  6CIT Screen  What Year? 0 points  0 points 0 points 0 points  What month? 0 points 0 points 0 points 0 points 0 points  What time? 0 points 0 points 0 points 0 points 0 points  Count back from 20 0 points 4 points 0 points 0 points 0 points  Months in reverse 0 points 0 points 0 points 2 points 0 points  Repeat phrase 2 points 4 points 0 points 2 points 0 points  Total Score 2 points  0 points 4 points 0 points    Immunizations Immunization History  Administered Date(s) Administered   Fluad Quad(high Dose 65+) 08/13/2021, 08/05/2022   Fluad Trivalent(High Dose 65+) 08/18/2023   Influenza Split 07/08/2011   Influenza, High Dose Seasonal PF 08/26/2017, 08/06/2018, 07/28/2019, 08/13/2020   Influenza,inj,Quad PF,6+ Mos 07/08/2014   Influenza-Unspecified 08/09/2012, 08/01/2013, 06/30/2015, 08/04/2016, 07/31/2017   Moderna Covid-19 Fall Seasonal Vaccine 62yrs & older 08/18/2023   Moderna Covid-19 Vaccine Bivalent Booster 47yrs & up 08/05/2022   PFIZER(Purple Top)SARS-COV-2 Vaccination 11/19/2019, 12/10/2019, 07/22/2020   PNEUMOCOCCAL CONJUGATE-20 09/29/2022   Pfizer Covid-19 Vaccine Bivalent Booster 69yrs & up 08/13/2021   Pneumococcal Conjugate-13 11/03/2014   Pneumococcal Polysaccharide-23 02/19/2013, 08/20/2017   Td 10/31/2000   Tdap 10/22/2015   Varicella 06/15/2011   Zoster Recombinant(Shingrix) 03/21/2017, 06/04/2017, 10/18/2022, 12/27/2022   Zoster, Live 06/16/2010, 10/31/2010    TDAP status: Up to date  Flu Vaccine status: Up to  date  Pneumococcal vaccine status: Up to date  Covid-19 vaccine status: Information provided on how to obtain vaccines.   Qualifies for Shingles Vaccine?  Yes   Zostavax completed No   Shingrix Completed?: Yes  Screening Tests Health Maintenance  Topic Date Due   Medicare Annual Wellness (AWV)  12/27/2024   DTaP/Tdap/Td (3 - Td or Tdap) 10/21/2025   Pneumonia Vaccine 63+ Years old  Completed   INFLUENZA VACCINE  Completed   DEXA SCAN  Completed   COVID-19 Vaccine  Completed   Hepatitis C Screening  Completed   Zoster Vaccines- Shingrix  Completed   HPV VACCINES  Aged Out   Colonoscopy  Discontinued    Health Maintenance  There are no preventive care reminders to display for this patient.  Colorectal cancer screening: No longer required.   Mammogram status: Completed  . Repeat every year  Bone Density status: Completed 05/13/2022. Results reflect: Bone density results: OSTEOPENIA. Repeat every 2 years.  Lung Cancer Screening: (Low Dose CT Chest recommended if Age 56-80 years, 20 pack-year currently smoking OR have quit w/in 15years.) does not qualify.   Additional Screening:  Hepatitis C Screening: does qualify; Completed   Vision Screening: Recommended annual ophthalmology exams for early detection of glaucoma and other disorders of the eye. Is the patient up to date with their annual eye exam?  no, needs appt, will make  Who is the provider or what is the name of the office in which the patient attends annual eye exams?  [facility] If pt is not established with a provider, would they like to be referred to a provider to establish care? No .   Dental Screening: Recommended annual dental exams for proper oral hygiene  Community Resource Referral / Chronic Care Management: CRR required this visit?  No   CCM required this visit?  No     Plan:     I have personally reviewed and noted the following in the patient's chart:   Medical and social history Use of alcohol,  tobacco or illicit drugs  Current medications and supplements including opioid prescriptions. Patient is currently taking opioid prescriptions. Information provided to patient regarding non-opioid alternatives. Patient advised to discuss non-opioid treatment plan with their provider. Functional ability and status Nutritional status Physical activity Advanced directives List of other physicians Hospitalizations, surgeries, and ER visits in previous 12 months Vitals Screenings to include cognitive, depression, and falls Referrals and appointments  In addition, I have reviewed and discussed with patient certain preventive protocols, quality metrics, and best practice recommendations. A written personalized care plan for preventive services as well as general preventive health recommendations were provided to patient.     Sharon Seller, NP   12/28/2023   After Visit Summary: (MyChart) Due to this being a telephonic visit, the after visit summary with patients personalized plan was offered to patient via MyChart

## 2023-12-28 NOTE — Progress Notes (Signed)
  This service is provided via telemedicine  No vital signs collected/recorded due to the encounter was a telemedicine visit.   Location of patient (ex: home, work):  Home  Patient consents to a telephone visit:  Yes  Location of the provider (ex: office, home):  Office [place].   Name of any referring provider:  na  Names of all persons participating in the telemedicine service and their role in the encounter:  Rockie Neighbours, Patient, Lelon Mast, daughter, Synetta Fail Jazariah Teall, CMA, Abbey Chatters, NP  Time spent on call:  7:11

## 2024-01-16 DIAGNOSIS — M47816 Spondylosis without myelopathy or radiculopathy, lumbar region: Secondary | ICD-10-CM | POA: Diagnosis not present

## 2024-01-16 DIAGNOSIS — Z79891 Long term (current) use of opiate analgesic: Secondary | ICD-10-CM | POA: Diagnosis not present

## 2024-01-18 ENCOUNTER — Other Ambulatory Visit: Payer: Self-pay | Admitting: Nurse Practitioner

## 2024-01-18 DIAGNOSIS — E7800 Pure hypercholesterolemia, unspecified: Secondary | ICD-10-CM

## 2024-02-13 ENCOUNTER — Telehealth: Payer: Self-pay | Admitting: *Deleted

## 2024-02-13 NOTE — Telephone Encounter (Signed)
 Called [employer] and spoke with April. She stated that [employer] was Terminated on 4/14.  In patient's chart under Demo/Ins. it states that patient has [facility] Medicare.  April will re Run with [facility], [facility], [facility] Medicare benefits.  Verification Pending.

## 2024-02-13 NOTE — Telephone Encounter (Signed)
 Verification received from [employer].  States that Patient's Policy was terminated on 02/12/24  Tried calling patient to get clarification on Current Insurance to obtain Verification for patient's Prolia Injection. LMOM to return call.

## 2024-02-22 DIAGNOSIS — M47816 Spondylosis without myelopathy or radiculopathy, lumbar region: Secondary | ICD-10-CM | POA: Diagnosis not present

## 2024-02-22 DIAGNOSIS — Z79891 Long term (current) use of opiate analgesic: Secondary | ICD-10-CM | POA: Diagnosis not present

## 2024-02-22 NOTE — Telephone Encounter (Signed)
 Colleen [name] will discuss with her at follow up appt in May

## 2024-02-22 NOTE — Telephone Encounter (Signed)
 Received Verification from [employer] and the Copay is  $346 (20% of Inj and 20% of Admin) PA Approved through [facility] Medicare 02/22/24-02/21/25 Auth # [id number]  Called and spoke with Patient and Daughter and they both Agreed that this is TOO much and would like the medication Changed to something oral and Affordable. Does Not want Prolia  anymore.  Please Advise.

## 2024-02-23 ENCOUNTER — Other Ambulatory Visit: Payer: Self-pay | Admitting: Family

## 2024-02-29 ENCOUNTER — Other Ambulatory Visit: Payer: Medicare Other

## 2024-02-29 DIAGNOSIS — E039 Hypothyroidism, unspecified: Secondary | ICD-10-CM

## 2024-02-29 DIAGNOSIS — M810 Age-related osteoporosis without current pathological fracture: Secondary | ICD-10-CM | POA: Diagnosis not present

## 2024-02-29 DIAGNOSIS — I10 Essential (primary) hypertension: Secondary | ICD-10-CM | POA: Diagnosis not present

## 2024-03-01 ENCOUNTER — Encounter: Payer: Self-pay | Admitting: Nurse Practitioner

## 2024-03-01 LAB — COMPLETE METABOLIC PANEL WITHOUT GFR
AG Ratio: 1.7 (calc) (ref 1.0–2.5)
ALT: 14 U/L (ref 6–29)
AST: 30 U/L (ref 10–35)
Albumin: 4.3 g/dL (ref 3.6–5.1)
Alkaline phosphatase (APISO): 72 U/L (ref 37–153)
BUN/Creatinine Ratio: 16 (calc) (ref 6–22)
BUN: 19 mg/dL (ref 7–25)
CO2: 28 mmol/L (ref 20–32)
Calcium: 9.7 mg/dL (ref 8.6–10.4)
Chloride: 106 mmol/L (ref 98–110)
Creat: 1.2 mg/dL — ABNORMAL HIGH (ref 0.60–1.00)
Globulin: 2.5 g/dL (ref 1.9–3.7)
Glucose, Bld: 94 mg/dL (ref 65–99)
Potassium: 4.4 mmol/L (ref 3.5–5.3)
Sodium: 144 mmol/L (ref 135–146)
Total Bilirubin: 0.5 mg/dL (ref 0.2–1.2)
Total Protein: 6.8 g/dL (ref 6.1–8.1)

## 2024-03-01 LAB — TSH: TSH: 0.9 m[IU]/L (ref 0.40–4.50)

## 2024-03-01 LAB — CBC WITH DIFFERENTIAL/PLATELET
Absolute Lymphocytes: 1547 {cells}/uL (ref 850–3900)
Absolute Monocytes: 221 {cells}/uL (ref 200–950)
Basophils Absolute: 31 {cells}/uL (ref 0–200)
Basophils Relative: 0.9 %
Eosinophils Absolute: 99 {cells}/uL (ref 15–500)
Eosinophils Relative: 2.9 %
HCT: 37.1 % (ref 35.0–45.0)
Hemoglobin: 11.8 g/dL (ref 11.7–15.5)
MCH: 30.3 pg (ref 27.0–33.0)
MCHC: 31.8 g/dL — ABNORMAL LOW (ref 32.0–36.0)
MCV: 95.4 fL (ref 80.0–100.0)
MPV: 12.3 fL (ref 7.5–12.5)
Monocytes Relative: 6.5 %
Neutro Abs: 1503 {cells}/uL (ref 1500–7800)
Neutrophils Relative %: 44.2 %
Platelets: 252 10*3/uL (ref 140–400)
RBC: 3.89 10*6/uL (ref 3.80–5.10)
RDW: 12.1 % (ref 11.0–15.0)
Total Lymphocyte: 45.5 %
WBC: 3.4 10*3/uL — ABNORMAL LOW (ref 3.8–10.8)

## 2024-03-08 ENCOUNTER — Ambulatory Visit (INDEPENDENT_AMBULATORY_CARE_PROVIDER_SITE_OTHER): Payer: Medicare HMO | Admitting: Nurse Practitioner

## 2024-03-08 ENCOUNTER — Encounter: Payer: Self-pay | Admitting: Nurse Practitioner

## 2024-03-08 VITALS — BP 130/64 | HR 70 | Temp 97.9°F | Resp 20 | Ht 61.0 in | Wt 174.0 lb

## 2024-03-08 DIAGNOSIS — K5909 Other constipation: Secondary | ICD-10-CM

## 2024-03-08 DIAGNOSIS — G8929 Other chronic pain: Secondary | ICD-10-CM | POA: Diagnosis not present

## 2024-03-08 DIAGNOSIS — E039 Hypothyroidism, unspecified: Secondary | ICD-10-CM

## 2024-03-08 DIAGNOSIS — N179 Acute kidney failure, unspecified: Secondary | ICD-10-CM | POA: Diagnosis not present

## 2024-03-08 DIAGNOSIS — R413 Other amnesia: Secondary | ICD-10-CM | POA: Diagnosis not present

## 2024-03-08 DIAGNOSIS — R739 Hyperglycemia, unspecified: Secondary | ICD-10-CM | POA: Insufficient documentation

## 2024-03-08 DIAGNOSIS — I10 Essential (primary) hypertension: Secondary | ICD-10-CM | POA: Diagnosis not present

## 2024-03-08 DIAGNOSIS — M810 Age-related osteoporosis without current pathological fracture: Secondary | ICD-10-CM | POA: Diagnosis not present

## 2024-03-08 DIAGNOSIS — H6120 Impacted cerumen, unspecified ear: Secondary | ICD-10-CM | POA: Diagnosis not present

## 2024-03-08 DIAGNOSIS — M25512 Pain in left shoulder: Secondary | ICD-10-CM | POA: Diagnosis not present

## 2024-03-08 MED ORDER — ALENDRONATE SODIUM 70 MG PO TABS: 70.0000 mg | ORAL_TABLET | ORAL | 11 refills | Status: AC

## 2024-03-08 NOTE — Assessment & Plan Note (Signed)
 Ongoing, followed by ortho for pain management.

## 2024-03-08 NOTE — Assessment & Plan Note (Signed)
 Controlled, has been on hydrochlorothiazide .  Will stop due to elevate Cr. She will monitor bp at home and notify via mychart of readings

## 2024-03-08 NOTE — Assessment & Plan Note (Signed)
 Stable on miralax daily

## 2024-03-08 NOTE — Patient Instructions (Signed)
 LAB APPT IN 1 month  Stop hydrochlorothiazide  Monitor bp- at least 3 times weekly Send reading via mychart

## 2024-03-08 NOTE — Progress Notes (Signed)
 [name]: Patient Care Team: Verma Gobble, NP as PCP - General (Nurse Practitioner)  PLACE OF SERVICE:  [facility]  Advanced Directive information    No Known Allergies  Chief Complaint  Patient presents with   Follow-up    Patient has concerns about right ear.    HPI:  Discussed the use of AI scribe software for clinical note transcription with the patient, who gave verbal consent to proceed.  History of Present Illness Colleen [name] is a 78 year old female who presents for routine follow up   She experiences chronic pain that is not well controlled. She was previously taking Celebrex daily for a week and then every other day for another week, but it was discontinued due to lab results. Ortho is managing pain and She is currently considering increasing her Norco dosage to 7.5 mg and possibly receiving another steroid injection.   She has been on hydrochlorothiazide  for swelling, but it is suspected to contribute to dehydration and possibly an acute kidney injury. .   She experiences some cognitive changes, which her daughter describes as subtle but consistent. There is no history of falls or injuries, and she has not noticed any deficits.   She is also concerned about possible cerumen impaction in her left ear.  She has osteoporosis and has been receiving Prolia  injections. She has not been on any other osteoporosis treatments. She takes vitamin D plus calcium  supplements, but not a dedicated vitamin D supplement.  She experiences constipation, likely due to Norco use, and manages it with daily Miralax (17 grams). Her bowel movements have improved with this regimen.  Her nutrition is a concern as she eats twice a day, with a light breakfast and a small dinner, supplemented by protein shakes. She reports feeling weak, which improves with better nutrition. She has also been experiencing headaches, which she attributes to needing new glasses. She had mild congestion a  few weeks ago, which improved with pseudoephedrine.      Review of Systems:  Review of Systems  Constitutional:  Negative for chills, fever and weight loss.  HENT:  Negative for tinnitus.   Respiratory:  Negative for cough, sputum production and shortness of breath.   Cardiovascular:  Negative for chest pain, palpitations and leg swelling.  Gastrointestinal:  Negative for abdominal pain, constipation, diarrhea and heartburn.  Genitourinary:  Negative for dysuria, frequency and urgency.  Musculoskeletal:  Positive for joint pain and myalgias. Negative for back pain and falls.  Skin: Negative.   Neurological:  Negative for dizziness and headaches.  Psychiatric/Behavioral:  Negative for depression and memory loss. The patient does not have insomnia.     Past Medical History:  Diagnosis Date   Allergic rhinitis    Anxiety    Arthritis    Arthritis of left wrist 10/13/2021   Atrophic vaginitis 06/25/2019   Biceps tendinitis 02/07/2022   Breast screening 10/26/2010   Last Assessment & Plan:  Formatting of this note might be different from the original. Will order a mammogram for this fall;  Follow up with me around Thanksgiving.   Callus of heel 02/19/2013   Cardiac murmur 10/09/2020   Colon polyps    adenomatous   Constipation 08/20/2013   Corns and callosities    Diverticulosis    DOE (dyspnea on exertion) 10/09/2020   Dysuria 06/03/2011   Last Assessment & Plan:  Formatting of this note might be different from the original. New, acute complaint today;  Will check urinalysis with  her labs today;   Full thickness rotator cuff tear 02/07/2022   GERD (gastroesophageal reflux disease)    Glaucoma    H/O total hysterectomy 02/19/2013   Hallux valgus 10/26/2010   Last Assessment & Plan:  Formatting of this note might be different from the original. Had surgery with Dr Michele Ahle back in October;   Will have her follow up with him.   Heartburn 04/30/2014   Helicobacter pylori gastritis  08/12/2014   Hematoma of lower leg 06/12/2018   Hiatal hernia with gastroesophageal reflux 12/23/2014   Hypercholesteremia    no meds   Hypercholesterolemia 06/03/2011   Overview:  TC 205 (elevated) in 2010  Last Assessment & Plan:  Formatting of this note might be different from the original. Lab Results  Component Value Date   CHOL 205 12/09/2011   CHOL 233 06/03/2011   CHOL 205 05/26/2009   Lab Results  Component Value Date   HDL 82 12/09/2011   HDL 90 06/03/2011   HDL 75 05/26/2009   Lab Results  Component Value Date   LDLCALC 113 12/09/2011   LDLCALC 133 06/03/2011   LDLCA   Hypothyroidism    Left-sided low back pain with left-sided sciatica 08/12/2014   Neck pain 10/26/2010   Obesity    Osteoarthritis 10/26/2010   Osteoarthritis of acromioclavicular joint 02/07/2022   Osteopenia 01/08/2014   Overweight 10/26/2010   Postmenopausal estrogen deficiency 02/19/2013   Recurrent UTI 06/25/2019   Restless leg syndrome 08/20/2013   Routine general medical examination at a health care facility 02/19/2013   S/P carpal tunnel release 10/26/2010   Screen for colon cancer 12/16/2011   Last Assessment & Plan:  Formatting of this note might be different from the original. Refer to GI;   Screening for diabetes mellitus    Shoulder pain, right 12/16/2011   Last Assessment & Plan:  Formatting of this note might be different from the original. We have tried physical therapy;   We shot and reviewed films today;  She has  1)  Osteopenia 2)  Small spur at humeral head 3)  Some hypertrophic/lipping changes at the inferior glenoid rim;   I also injected it last visit;  She is on NSAIDS and tylenol ;  Will trial voltaren  gel;  Then get ortho opinion;   Skin spots-aging 07/08/2014   Strain of neck muscle 06/12/2018   Urge incontinence    Whiplash injury 06/26/2018   Past Surgical History:  Procedure Laterality Date   ABDOMINAL HYSTERECTOMY  1985   Dr Saratha Cunas Right    Dr Michele Ahle   CARPAL TUNNEL RELEASE  Bilateral 2010   Dr Sulema Endo   CATARACT EXTRACTION Bilateral    CESAREAN SECTION  1983   1 time   COLONOSCOPY     ROTATOR CUFF REPAIR Left 03/10/2022   Dr Hazeline Lister   THYROIDECTOMY  1987   Dr Andrez Banker   TONSILLECTOMY  1951   Social History:   reports that she quit smoking about 37 years ago. Her smoking use included cigarettes. She has never used smokeless tobacco. She reports that she does not drink alcohol and does not use drugs.  Family History  Problem Relation Age of Onset   Leukemia Mother 40   Lung cancer Father    Diabetes Brother    COPD Brother    Lung cancer Brother    Diabetes Maternal Grandmother    Cancer Maternal Grandfather        unknown type   Breast cancer Maternal  Aunt    Colon polyps Neg Hx    Rectal cancer Neg Hx     Medications: Patient's Medications  New Prescriptions   ALENDRONATE (FOSAMAX) 70 MG TABLET    Take 1 tablet (70 mg total) by mouth every 7 (seven) days. Take with a full glass of water on an empty stomach.  Previous Medications   ASCORBIC ACID (VITAMIN C PO)    Take 1 tablet by mouth daily.   ASPIRIN  EC 81 MG TABLET    Take 81 mg by mouth every other day. Swallow whole.   ATORVASTATIN  (LIPITOR) 10 MG TABLET    TAKE 0.5 TABLETS (5 MG TOTAL) BY MOUTH DAILY. E78.00   CALCIUM -VITAMIN D PO    Take 1 capsule by mouth daily.   CRANBERRY PO    Take 1 tablet by mouth daily.   CYCLOBENZAPRINE (FLEXERIL) 10 MG TABLET    Take 10 mg by mouth at bedtime.   DICLOFENAC  SODIUM (VOLTAREN ) 1 % GEL    APPLY 2 GRAMS TOPICALLY 4 (FOUR) TIMES DAILY AS NEEDED.   HYDROCODONE -ACETAMINOPHEN  (NORCO/VICODIN) 5-325 MG TABLET    Take 1 tablet by mouth every 8 (eight) hours as needed.   HYDROCODONE -ACETAMINOPHEN  (NORCO/VICODIN) 5-325 MG TABLET    Take 1 tablet by mouth every 4 (four) hours.   HYDROXYPROPYL METHYLCELLULOSE / HYPROMELLOSE (ISOPTO TEARS / GONIOVISC) 2.5 % OPHTHALMIC SOLUTION    Place 1 drop into both eyes as needed for dry eyes.    KRILL OIL OMEGA-3 PO    Take  1 tablet by mouth daily.   LEVOTHYROXINE  (SYNTHROID ) 25 MCG TABLET    TAKE 1 TABLET BY MOUTH EVERY DAY BEFORE BREAKFAST   LIDOCAINE (LIDODERM) 5 %    Place 1 patch onto the skin daily. Remove & Discard patch within 12 hours or as directed by MD   METHOCARBAMOL  (ROBAXIN ) 500 MG TABLET    Take 500 mg by mouth daily. Every morning   MULTIPLE VITAMIN (MULTIVITAMIN WITH MINERALS) TABS    Take 1 tablet by mouth daily.   OMEPRAZOLE  (PRILOSEC) 40 MG CAPSULE    TAKE 1 CAPSULE (40 MG TOTAL) BY MOUTH DAILY.  Modified Medications   No medications on file  Discontinued Medications   DENOSUMAB  (PROLIA ) 60 MG/ML SOSY INJECTION    Inject 60 mg into the skin every 6 (six) months.   HYDROCHLOROTHIAZIDE  (HYDRODIURIL ) 12.5 MG TABLET    TAKE 0.5 TABLETS BY MOUTH DAILY.    Physical Exam:  Vitals:   03/08/24 0828  BP: 130/64  Pulse: 70  Resp: 20  Temp: 97.9 F (36.6 C)  SpO2: 92%  Weight: 174 lb (78.9 kg)  Height: 5\' 1"  (1.549 m)   Body mass index is 32.88 kg/m. Wt Readings from Last 3 Encounters:  03/08/24 174 lb (78.9 kg)  09/04/23 164 lb (74.4 kg)  06/22/23 163 lb 11.2 oz (74.3 kg)    Physical Exam Constitutional:      General: She is not in acute distress.    Appearance: She is well-developed. She is not diaphoretic.  HENT:     Head: Normocephalic and atraumatic.     Left Ear: There is impacted cerumen.     Mouth/Throat:     Pharynx: No oropharyngeal exudate.  Eyes:     Conjunctiva/sclera: Conjunctivae normal.     Pupils: Pupils are equal, round, and reactive to light.  Cardiovascular:     Rate and Rhythm: Normal rate and regular rhythm.     Heart sounds: Normal heart sounds.  Pulmonary:     Effort: Pulmonary effort is normal.     Breath sounds: Normal breath sounds.  Abdominal:     General: Bowel sounds are normal.     Palpations: Abdomen is soft.  Musculoskeletal:     Cervical back: Normal range of motion and neck supple.     Right lower leg: No edema.     Left lower leg: No  edema.  Skin:    General: Skin is warm and dry.  Neurological:     Mental Status: She is alert.  Psychiatric:        Mood and Affect: Mood normal.    Labs reviewed: Basic Metabolic Panel: Recent Labs    06/22/23 1346 02/29/24 0805  NA 143 144  K 5.2 4.4  CL 107 106  CO2 31 28  GLUCOSE 93 94  BUN 18 19  CREATININE 1.01* 1.20*  CALCIUM  9.7 9.7  TSH  --  0.90   Liver Function Tests: Recent Labs    02/29/24 0805  AST 30  ALT 14  BILITOT 0.5  PROT 6.8   No results for input(s): "LIPASE", "AMYLASE" in the last 8760 hours. No results for input(s): "AMMONIA" in the last 8760 hours. CBC: Recent Labs    06/22/23 1346 02/29/24 0805  WBC 4.3 3.4*  NEUTROABS 1,883 1,503  HGB 11.4* 11.8  HCT 35.6 37.1  MCV 95.4 95.4  PLT 313 252   Lipid Panel: No results for input(s): "CHOL", "HDL", "LDLCALC", "TRIG", "CHOLHDL", "LDLDIRECT" in the last 8760 hours. TSH: Recent Labs    02/29/24 0805  TSH 0.90   A1C: Lab Results  Component Value Date   HGBA1C 5.4 06/22/2023     Assessment/Plan  Osteoporosis without current pathological fracture, unspecified osteoporosis type Assessment & Plan: Prolia  has become too expensive, she is willing to try fosamax weekly  Continue cal and vit d   Orders: -     VITAMIN D 25 Hydroxy (Vit-D Deficiency, Fractures); Future -     Alendronate Sodium; Take 1 tablet (70 mg total) by mouth every 7 (seven) days. Take with a full glass of water on an empty stomach.  Dispense: 4 tablet; Refill: 11  Acquired hypothyroidism  -tsh at goal on last lab, continues current synthroid  does.   Chronic left shoulder pain Assessment & Plan: Ongoing, followed by ortho for pain management.    AKI (acute kidney injury) (HCC)  Stopping hydrochlorothiazide  at this time and she has stop all NSAID use  Encouraged to increase hydration.  -     Basic Metabolic Panel Without GFR; Future  Essential (primary) hypertension Assessment & Plan: Controlled, has  been on hydrochlorothiazide .  Will stop due to elevate Cr. She will monitor bp at home and notify via mychart of readings   Memory loss Assessment & Plan: Noted by daughter Will get ct head for eval Will get memory screening on next   Orders: -     CT HEAD WO CONTRAST ( ); Future -     Vitamin B12; Future  Wax in ear [H61.20]  Lavage complete and tolerated well.   Other constipation Assessment & Plan: Stable on miralax daily      Return in about 6 months (around 09/08/2024) for routine follow up- needs memory test at this time.:  Arrabella Westerman K. Denney Fisherman [facility] [phone]

## 2024-03-08 NOTE — Assessment & Plan Note (Signed)
 Prolia  has become too expensive, she is willing to try fosamax weekly  Continue cal and vit d

## 2024-03-08 NOTE — Assessment & Plan Note (Signed)
 Noted by daughter Will get ct head for eval Will get memory screening on next

## 2024-03-14 DIAGNOSIS — Z79891 Long term (current) use of opiate analgesic: Secondary | ICD-10-CM | POA: Diagnosis not present

## 2024-03-14 DIAGNOSIS — M47816 Spondylosis without myelopathy or radiculopathy, lumbar region: Secondary | ICD-10-CM | POA: Diagnosis not present

## 2024-03-27 ENCOUNTER — Ambulatory Visit
Admission: RE | Admit: 2024-03-27 | Discharge: 2024-03-27 | Disposition: A | Discharge status: Home / Self Care | Source: Ambulatory Visit | Attending: Nurse Practitioner | Admitting: Nurse Practitioner

## 2024-03-27 DIAGNOSIS — R413 Other amnesia: Secondary | ICD-10-CM | POA: Diagnosis not present

## 2024-03-27 DIAGNOSIS — I6782 Cerebral ischemia: Secondary | ICD-10-CM | POA: Diagnosis not present

## 2024-04-05 ENCOUNTER — Other Ambulatory Visit: Payer: Self-pay | Admitting: Nurse Practitioner

## 2024-04-08 ENCOUNTER — Other Ambulatory Visit

## 2024-04-09 ENCOUNTER — Other Ambulatory Visit

## 2024-04-09 DIAGNOSIS — M810 Age-related osteoporosis without current pathological fracture: Secondary | ICD-10-CM

## 2024-04-09 DIAGNOSIS — R413 Other amnesia: Secondary | ICD-10-CM | POA: Diagnosis not present

## 2024-04-09 DIAGNOSIS — N179 Acute kidney failure, unspecified: Secondary | ICD-10-CM

## 2024-04-10 ENCOUNTER — Ambulatory Visit: Payer: Self-pay | Admitting: Nurse Practitioner

## 2024-04-29 DIAGNOSIS — M47816 Spondylosis without myelopathy or radiculopathy, lumbar region: Secondary | ICD-10-CM | POA: Diagnosis not present

## 2024-05-14 DIAGNOSIS — M47816 Spondylosis without myelopathy or radiculopathy, lumbar region: Secondary | ICD-10-CM | POA: Diagnosis not present

## 2024-05-14 DIAGNOSIS — Z79891 Long term (current) use of opiate analgesic: Secondary | ICD-10-CM | POA: Diagnosis not present

## 2024-05-22 DIAGNOSIS — M47816 Spondylosis without myelopathy or radiculopathy, lumbar region: Secondary | ICD-10-CM | POA: Diagnosis not present

## 2024-06-18 DIAGNOSIS — Z79891 Long term (current) use of opiate analgesic: Secondary | ICD-10-CM | POA: Diagnosis not present

## 2024-06-18 DIAGNOSIS — M47816 Spondylosis without myelopathy or radiculopathy, lumbar region: Secondary | ICD-10-CM | POA: Diagnosis not present

## 2024-06-19 ENCOUNTER — Other Ambulatory Visit: Payer: Self-pay | Admitting: Nurse Practitioner

## 2024-06-19 MED ORDER — OMEPRAZOLE 40 MG PO CPDR: 40.0000 mg | DELAYED_RELEASE_CAPSULE | Freq: Every day | ORAL | 1 refills | Status: DC

## 2024-06-19 NOTE — Telephone Encounter (Signed)
 Copied from CRM #[id number]. Topic: Clinical - Medication Refill >> Jun 19, 2024 11:18 AM Diannia H wrote: Medication: omeprazole  (PRILOSEC) 40 MG capsule  Has the patient contacted their pharmacy? Yes (Agent: If no, request that the patient contact the pharmacy for the refill. If patient does not wish to contact the pharmacy document the reason why and proceed with request.) (Agent: If yes, when and what did the pharmacy advise?)  This is the patient's preferred pharmacy:  [id number] [address] [place], [place] - [address] [place] [address] Phone: [phone] Fax: [phone]  Is this the correct pharmacy for this prescription? Yes If no, delete pharmacy and type the correct one.   Has the prescription been filled recently? No  Is the patient out of the medication? Yes  Has the patient been seen for an appointment in the last year OR does the patient have an upcoming appointment? Yes  Can we respond through MyChart? Yes  Agent: Please be advised that Rx refills may take up to 3 business days. We ask that you follow-up with your pharmacy.

## 2024-07-02 ENCOUNTER — Encounter: Payer: Self-pay | Admitting: Nurse Practitioner

## 2024-07-04 ENCOUNTER — Other Ambulatory Visit

## 2024-07-04 ENCOUNTER — Other Ambulatory Visit: Payer: Self-pay | Admitting: Nurse Practitioner

## 2024-07-04 DIAGNOSIS — R300 Dysuria: Secondary | ICD-10-CM

## 2024-07-05 ENCOUNTER — Ambulatory Visit: Payer: Self-pay | Admitting: Nurse Practitioner

## 2024-07-05 MED ORDER — CEPHALEXIN 500 MG PO CAPS: 500.0000 mg | ORAL_CAPSULE | Freq: Three times a day (TID) | ORAL | 0 refills | Status: DC

## 2024-07-06 LAB — URINALYSIS W MICROSCOPIC + REFLEX CULTURE
Bacteria, UA: NONE SEEN /HPF
Bilirubin Urine: NEGATIVE
Glucose, UA: NEGATIVE
Hgb urine dipstick: NEGATIVE
Hyaline Cast: NONE SEEN /LPF
Ketones, ur: NEGATIVE
Nitrites, Initial: POSITIVE — AB
Protein, ur: NEGATIVE
RBC / HPF: NONE SEEN /HPF (ref 0–2)
Specific Gravity, Urine: 1.01 (ref 1.001–1.035)
Squamous Epithelial / HPF: NONE SEEN /HPF (ref <=5)
pH: 7 (ref 5.0–8.0)

## 2024-07-06 LAB — URINE CULTURE
MICRO NUMBER:: 16924279
Result:: NO GROWTH
SPECIMEN QUALITY:: ADEQUATE

## 2024-07-06 LAB — CULTURE INDICATED

## 2024-07-09 ENCOUNTER — Other Ambulatory Visit: Payer: Self-pay | Admitting: Nurse Practitioner

## 2024-07-09 DIAGNOSIS — E7800 Pure hypercholesterolemia, unspecified: Secondary | ICD-10-CM

## 2024-07-16 DIAGNOSIS — Z79891 Long term (current) use of opiate analgesic: Secondary | ICD-10-CM | POA: Diagnosis not present

## 2024-07-16 DIAGNOSIS — M47816 Spondylosis without myelopathy or radiculopathy, lumbar region: Secondary | ICD-10-CM | POA: Diagnosis not present

## 2024-08-14 DIAGNOSIS — M47816 Spondylosis without myelopathy or radiculopathy, lumbar region: Secondary | ICD-10-CM | POA: Diagnosis not present

## 2024-08-26 ENCOUNTER — Other Ambulatory Visit: Payer: Medicare HMO

## 2024-09-06 LAB — BASIC METABOLIC PANEL WITHOUT GFR
BUN: 11 mg/dL (ref 7–25)
CO2: 27 mmol/L (ref 20–32)
Calcium: 9.5 mg/dL (ref 8.6–10.4)
Chloride: 107 mmol/L (ref 98–110)
Creat: 0.96 mg/dL (ref 0.60–1.00)
Glucose, Bld: 98 mg/dL (ref 65–139)
Potassium: 5.1 mmol/L (ref 3.5–5.3)
Sodium: 141 mmol/L (ref 135–146)

## 2024-09-06 LAB — VITAMIN B12: Vitamin B-12: 1014 pg/mL (ref 200–1100)

## 2024-09-06 LAB — VITAMIN D 25 HYDROXY (VIT D DEFICIENCY, FRACTURES): Vit D, 25-Hydroxy: 49 ng/mL (ref 30–100)

## 2024-09-09 ENCOUNTER — Ambulatory Visit (INDEPENDENT_AMBULATORY_CARE_PROVIDER_SITE_OTHER): Payer: Self-pay | Admitting: Nurse Practitioner

## 2024-09-09 ENCOUNTER — Encounter: Payer: Self-pay | Admitting: Nurse Practitioner

## 2024-09-09 VITALS — BP 110/76 | HR 72 | Temp 98.1°F | Ht 61.0 in | Wt 176.2 lb

## 2024-09-09 DIAGNOSIS — K5909 Other constipation: Secondary | ICD-10-CM

## 2024-09-09 DIAGNOSIS — M810 Age-related osteoporosis without current pathological fracture: Secondary | ICD-10-CM | POA: Diagnosis not present

## 2024-09-09 DIAGNOSIS — E7800 Pure hypercholesterolemia, unspecified: Secondary | ICD-10-CM

## 2024-09-09 DIAGNOSIS — M4306 Spondylolysis, lumbar region: Secondary | ICD-10-CM

## 2024-09-09 DIAGNOSIS — E2839 Other primary ovarian failure: Secondary | ICD-10-CM

## 2024-09-09 DIAGNOSIS — I10 Essential (primary) hypertension: Secondary | ICD-10-CM | POA: Diagnosis not present

## 2024-09-09 DIAGNOSIS — E039 Hypothyroidism, unspecified: Secondary | ICD-10-CM

## 2024-09-09 DIAGNOSIS — H6123 Impacted cerumen, bilateral: Secondary | ICD-10-CM

## 2024-09-09 DIAGNOSIS — R739 Hyperglycemia, unspecified: Secondary | ICD-10-CM

## 2024-09-09 NOTE — Progress Notes (Signed)
 [name]: Patient Care Team: Colleen Harlene POUR, NP as PCP - General (Nurse Practitioner)  PLACE OF SERVICE:  [facility]  Advanced Directive information    No Known Allergies  Chief Complaint  Patient presents with   Medical Management of Chronic Issues    6 month routine follow up    HPI:  Discussed the use of AI scribe software for clinical note transcription with the patient, who gave verbal consent to proceed.  History of Present Illness Colleen [name] is an 78 year old female with chronic back pain who presents for a six-month follow-up visit.  Since her last visit, she experienced a urinary tract infection. Initially treated with Macrobid, she was switched to Keflex  after a urine sample was analyzed. Despite completing a seven-day course of Keflex , symptoms persisted, leading to a three-day course of Macrobid, which resolved the symptoms. No recurrence has been noted since.  She experiences chronic back pain primarily at L5-S1. A recent procedure involving test shots and a subsequent nerve procedure reduced her pain from a 6-7 to a 2. She is on multiple pain medications, including the Butrans patch, Norco, methocarbamol , and flexeril.  She has a history of hyperglycemia, with her last A1C recorded as 5.4 in 2024. Her glucose levels have been stable, and her recent urinalysis did not show any glucose spillage.  She experiences chronic constipation, managed with Miralax. She reports improvement with Miralax but does not take it consistently. Her daughter emphasizes the importance of daily use, especially since she is on Norco, which can exacerbate constipation.  Her nutrition is a concern, as she tends to eat a good breakfast but has poor lunch and small dinner. She likes sweets, and her daughter encourages her to drink protein shakes to ensure adequate protein intake. She sometimes feels lightheaded, which her daughter attributes to being orthostatic due to dehydration and  poor nutrition.  She is on Fosamax  for osteoporosis, along with calcium  and vitamin D  supplements, which she tolerates well.  She has a history of earwax buildup and has used Debrox at home with some relief. No issues with breathing, shortness of breath, or cough. Her shoulder pain has improved but is not completely resolved.  She is not very active due to back pain, which has contributed to weight gain. Her daughter notes that she is not losing weight and encourages more physical activity as her back pain improves.   Review of Systems:  Review of Systems  Constitutional:  Negative for chills, fever and weight loss.  HENT:  Negative for tinnitus.   Respiratory:  Negative for cough, sputum production and shortness of breath.   Cardiovascular:  Negative for chest pain, palpitations and leg swelling.  Gastrointestinal:  Positive for constipation. Negative for abdominal pain, diarrhea and heartburn.  Genitourinary:  Negative for dysuria, frequency and urgency.  Musculoskeletal:  Positive for back pain and myalgias. Negative for falls and joint pain.  Skin: Negative.   Neurological:  Negative for dizziness and headaches.  Psychiatric/Behavioral:  Negative for depression and memory loss. The patient does not have insomnia.     Past Medical History:  Diagnosis Date   Allergic rhinitis    Anxiety    Arthritis    Arthritis of left wrist 10/13/2021   Atrophic vaginitis 06/25/2019   Biceps tendinitis 02/07/2022   Breast screening 10/26/2010   Last Assessment & Plan:  Formatting of this note might be different from the original. Will order a mammogram for this fall;  Follow up with  me around Thanksgiving.   Callus of heel 02/19/2013   Cardiac murmur 10/09/2020   Colon polyps    adenomatous   Constipation 08/20/2013   Corns and callosities    Diverticulosis    DOE (dyspnea on exertion) 10/09/2020   Dysuria 06/03/2011   Last Assessment & Plan:  Formatting of this note might be different  from the original. New, acute complaint today;  Will check urinalysis with her labs today;   Full thickness rotator cuff tear 02/07/2022   GERD (gastroesophageal reflux disease)    Glaucoma    H/O total hysterectomy 02/19/2013   Hallux valgus 10/26/2010   Last Assessment & Plan:  Formatting of this note might be different from the original. Had surgery with Dr Jenney back in October;   Will have her follow up with him.   Heartburn 04/30/2014   Helicobacter pylori gastritis 08/12/2014   Hematoma of lower leg 06/12/2018   Hiatal hernia with gastroesophageal reflux 12/23/2014   Hypercholesteremia    no meds   Hypercholesterolemia 06/03/2011   Overview:  TC 205 (elevated) in 2010  Last Assessment & Plan:  Formatting of this note might be different from the original. Lab Results  Component Value Date   CHOL 205 12/09/2011   CHOL 233 06/03/2011   CHOL 205 05/26/2009   Lab Results  Component Value Date   HDL 82 12/09/2011   HDL 90 06/03/2011   HDL 75 05/26/2009   Lab Results  Component Value Date   LDLCALC 113 12/09/2011   LDLCALC 133 06/03/2011   LDLCA   Hypothyroidism    Left-sided low back pain with left-sided sciatica 08/12/2014   Neck pain 10/26/2010   Obesity    Osteoarthritis 10/26/2010   Osteoarthritis of acromioclavicular joint 02/07/2022   Osteopenia 01/08/2014   Overweight 10/26/2010   Postmenopausal estrogen deficiency 02/19/2013   Recurrent UTI 06/25/2019   Restless leg syndrome 08/20/2013   Routine general medical examination at a health care facility 02/19/2013   S/P carpal tunnel release 10/26/2010   Screen for colon cancer 12/16/2011   Last Assessment & Plan:  Formatting of this note might be different from the original. Refer to GI;   Screening for diabetes mellitus    Shoulder pain, right 12/16/2011   Last Assessment & Plan:  Formatting of this note might be different from the original. We have tried physical therapy;   We shot and reviewed films today;  She has  1)  Osteopenia 2)  Small  spur at humeral head 3)  Some hypertrophic/lipping changes at the inferior glenoid rim;   I also injected it last visit;  She is on NSAIDS and tylenol ;  Will trial voltaren  gel;  Then get ortho opinion;   Skin spots-aging 07/08/2014   Strain of neck muscle 06/12/2018   Urge incontinence    Whiplash injury 06/26/2018   Past Surgical History:  Procedure Laterality Date   ABDOMINAL HYSTERECTOMY  1985   Dr Henry EDWARDS Right    Dr Jenney   CARPAL TUNNEL RELEASE Bilateral 2010   Dr Georgina   CATARACT EXTRACTION Bilateral    CESAREAN SECTION  1983   1 time   COLONOSCOPY     ROTATOR CUFF REPAIR Left 03/10/2022   Dr Gerome   THYROIDECTOMY  1987   Dr Simeon   TONSILLECTOMY  1951   Social History:   reports that she quit smoking about 37 years ago. Her smoking use included cigarettes. She has never used smokeless tobacco. She  reports that she does not drink alcohol and does not use drugs.  Family History  Problem Relation Age of Onset   Leukemia Mother 74   Lung cancer Father    Diabetes Brother    COPD Brother    Lung cancer Brother    Diabetes Maternal Grandmother    Cancer Maternal Grandfather        unknown type   Breast cancer Maternal Aunt    Colon polyps Neg Hx    Rectal cancer Neg Hx     Medications: Patient's Medications  New Prescriptions   No medications on file  Previous Medications   ALENDRONATE  (FOSAMAX ) 70 MG TABLET    Take 1 tablet (70 mg total) by mouth every 7 (seven) days. Take with a full glass of water on an empty stomach.   ASCORBIC ACID (VITAMIN C PO)    Take 1 tablet by mouth daily.   ASPIRIN  EC 81 MG TABLET    Take 81 mg by mouth every other day. Swallow whole.   ATORVASTATIN  (LIPITOR) 10 MG TABLET    TAKE 0.5 TABLETS (5 MG TOTAL) BY MOUTH DAILY. E78.00   BUPRENORPHINE (BUTRANS) 20 MCG/HR PTWK    Place 1 patch onto the skin once a week.   CALCIUM -VITAMIN D  PO    Take 1 capsule by mouth daily.   CEPHALEXIN  (KEFLEX ) 500 MG CAPSULE    Take 1  capsule (500 mg total) by mouth 3 (three) times daily.   CRANBERRY PO    Take 1 tablet by mouth daily.   CYCLOBENZAPRINE (FLEXERIL) 10 MG TABLET    Take 10 mg by mouth at bedtime.   DICLOFENAC  SODIUM (VOLTAREN ) 1 % GEL    APPLY 2 GRAMS TOPICALLY 4 (FOUR) TIMES DAILY AS NEEDED.   HYDROCODONE -ACETAMINOPHEN  (NORCO/VICODIN) 5-325 MG TABLET    Take 1 tablet by mouth every 8 (eight) hours as needed.   HYDROCODONE -ACETAMINOPHEN  (NORCO/VICODIN) 5-325 MG TABLET    Take 1 tablet by mouth every 4 (four) hours.   HYDROXYPROPYL METHYLCELLULOSE / HYPROMELLOSE (ISOPTO TEARS / GONIOVISC) 2.5 % OPHTHALMIC SOLUTION    Place 1 drop into both eyes as needed for dry eyes.    KRILL OIL OMEGA-3 PO    Take 1 tablet by mouth daily.   LEVOTHYROXINE  (SYNTHROID ) 25 MCG TABLET    TAKE 1 TABLET BY MOUTH EVERY DAY BEFORE BREAKFAST   LIDOCAINE (LIDODERM) 5 %    Place 1 patch onto the skin daily. Remove & Discard patch within 12 hours or as directed by MD   METHOCARBAMOL  (ROBAXIN ) 500 MG TABLET    Take 500 mg by mouth daily. Every morning   MULTIPLE VITAMIN (MULTIVITAMIN WITH MINERALS) TABS    Take 1 tablet by mouth daily.   OMEPRAZOLE  (PRILOSEC) 40 MG CAPSULE    Take 1 capsule (40 mg total) by mouth daily.  Modified Medications   No medications on file  Discontinued Medications   No medications on file    Physical Exam:  Vitals:   09/09/24 0923  BP: 110/76  Pulse: 72  Temp: 98.1 F (36.7 C)  SpO2: 98%  Weight: 176 lb 3.2 oz (79.9 kg)  Height: 5' 1 (1.549 m)   Body mass index is 33.29 kg/m. Wt Readings from Last 3 Encounters:  09/09/24 176 lb 3.2 oz (79.9 kg)  03/08/24 174 lb (78.9 kg)  09/04/23 164 lb (74.4 kg)    Physical Exam Constitutional:      General: She is not in acute distress.  Appearance: She is well-developed. She is not diaphoretic.  HENT:     Head: Normocephalic and atraumatic.     Mouth/Throat:     Pharynx: No oropharyngeal exudate.  Eyes:     Conjunctiva/sclera: Conjunctivae  normal.     Pupils: Pupils are equal, round, and reactive to light.  Cardiovascular:     Rate and Rhythm: Normal rate and regular rhythm.     Heart sounds: Normal heart sounds.  Pulmonary:     Effort: Pulmonary effort is normal.     Breath sounds: Normal breath sounds.  Abdominal:     General: Bowel sounds are normal.     Palpations: Abdomen is soft.  Musculoskeletal:     Cervical back: Normal range of motion and neck supple.     Right lower leg: No edema.     Left lower leg: No edema.  Skin:    General: Skin is warm and dry.  Neurological:     Mental Status: She is alert.  Psychiatric:        Mood and Affect: Mood normal.     Labs reviewed: Basic Metabolic Panel: Recent Labs    02/29/24 0805 04/09/24 1101  NA 144 141  K 4.4 5.1  CL 106 107  CO2 28 27  GLUCOSE 94 98  BUN 19 11  CREATININE 1.20* 0.96  CALCIUM  9.7 9.5  TSH 0.90  --    Liver Function Tests: Recent Labs    02/29/24 0805  AST 30  ALT 14  BILITOT 0.5  PROT 6.8   No results for input(s): LIPASE, AMYLASE in the last 8760 hours. No results for input(s): AMMONIA in the last 8760 hours. CBC: Recent Labs    02/29/24 0805  WBC 3.4*  NEUTROABS 1,503  HGB 11.8  HCT 37.1  MCV 95.4  PLT 252   Lipid Panel: No results for input(s): CHOL, HDL, LDLCALC, TRIG, CHOLHDL, LDLDIRECT in the last 8760 hours. TSH: Recent Labs    02/29/24 0805  TSH 0.90   A1C: Lab Results  Component Value Date   HGBA1C 5.4 06/22/2023     Assessment/Plan Hypercholesterolemia Assessment & Plan: Continues on lipitor Follow up lipids  Orders: -     Lipid panel  Osteoporosis without current pathological fracture, unspecified osteoporosis type Assessment & Plan: Managed with Fosamax , calcium , and vitamin D . Recent vitamin D  level normal. - Continue Fosamax , calcium , and vitamin D  supplementation. - Ordered bone density scan at [facility]  Orders: -     DG Bone Density; Future  Acquired  hypothyroidism Assessment & Plan: Continues on synthroid  25 mcg, TSH at goal Follow yearly    Essential (primary) hypertension Assessment & Plan: Well controlled off medication  Continue to monitor Continue dietary modifications  Orders: -     CBC with Differential/Platelet -     Comprehensive metabolic panel with GFR  Hyperglycemia Assessment & Plan: Continue dietary modifications Follow up a1c  Orders: -     Hemoglobin A1c  Spondylolysis of lumbar region Assessment & Plan: Chronic back pain at L5-S1 improved post nerve ablation. Current pain management effective. - Continue Butrans patch, methocarbamol , and Norco as needed per pain management  - Referred to home health physical therapy for back strengthening exercises. - Discussed potential for reducing Norco frequency as pain improves.  Orders: -     Ambulatory referral to Home Health  Estrogen deficiency -     DG Bone Density; Future  Bilateral impacted cerumen Ear lavage completed with success.  Other constipation  Managed with Miralax. Emphasized importance of daily use with Norco. - Continue daily Miralax to prevent constipation.  Return in about 6 months (around 03/09/2025) for routine follow up.  Colleen [name] K. Colleen BODILY [facility] [phone]

## 2024-09-11 DIAGNOSIS — M4306 Spondylolysis, lumbar region: Secondary | ICD-10-CM | POA: Insufficient documentation

## 2024-09-11 NOTE — Assessment & Plan Note (Signed)
 Chronic back pain at L5-S1 improved post nerve ablation. Current pain management effective. - Continue Butrans patch, methocarbamol , and Norco as needed per pain management  - Referred to home health physical therapy for back strengthening exercises. - Discussed potential for reducing Norco frequency as pain improves.

## 2024-09-11 NOTE — Assessment & Plan Note (Signed)
 Continues on synthroid  25 mcg, TSH at goal Follow yearly

## 2024-09-11 NOTE — Assessment & Plan Note (Signed)
 Continue dietary modifications  Follow up a1c

## 2024-09-11 NOTE — Assessment & Plan Note (Signed)
 Well controlled off medication  Continue to monitor Continue dietary modifications

## 2024-09-11 NOTE — Assessment & Plan Note (Signed)
 Continues on lipitor Follow up lipids

## 2024-09-11 NOTE — Assessment & Plan Note (Signed)
 Managed with Fosamax , calcium , and vitamin D . Recent vitamin D  level normal. - Continue Fosamax , calcium , and vitamin D  supplementation. - Ordered bone density scan at [facility].

## 2024-09-12 ENCOUNTER — Ambulatory Visit: Payer: Self-pay | Admitting: Nurse Practitioner

## 2024-09-12 DIAGNOSIS — D649 Anemia, unspecified: Secondary | ICD-10-CM

## 2024-09-14 LAB — COMPREHENSIVE METABOLIC PANEL WITH GFR
AG Ratio: 1.6 (calc) (ref 1.0–2.5)
ALT: 12 U/L (ref 6–29)
AST: 30 U/L (ref 10–35)
Albumin: 4.1 g/dL (ref 3.6–5.1)
Alkaline phosphatase (APISO): 66 U/L (ref 37–153)
BUN/Creatinine Ratio: 15 (calc) (ref 6–22)
BUN: 15 mg/dL (ref 7–25)
CO2: 26 mmol/L (ref 20–32)
Calcium: 9.2 mg/dL (ref 8.6–10.4)
Chloride: 108 mmol/L (ref 98–110)
Creat: 1 mg/dL — ABNORMAL HIGH (ref 0.60–0.95)
Globulin: 2.5 g/dL (ref 1.9–3.7)
Glucose, Bld: 91 mg/dL (ref 65–99)
Potassium: 4.2 mmol/L (ref 3.5–5.3)
Sodium: 143 mmol/L (ref 135–146)
Total Bilirubin: 0.4 mg/dL (ref 0.2–1.2)
Total Protein: 6.6 g/dL (ref 6.1–8.1)
eGFR: 57 mL/min/1.73m2 — ABNORMAL LOW (ref >=60)

## 2024-09-14 LAB — LIPID PANEL
Cholesterol: 173 mg/dL (ref <200)
HDL: 91 mg/dL (ref >=50)
LDL Cholesterol (Calc): 69 mg/dL
Non-HDL Cholesterol (Calc): 82 mg/dL (ref <130)
Total CHOL/HDL Ratio: 1.9 (calc) (ref <5.0)
Triglycerides: 53 mg/dL (ref <150)

## 2024-09-14 LAB — CBC WITH DIFFERENTIAL/PLATELET
Absolute Lymphocytes: 1765 {cells}/uL (ref 850–3900)
Absolute Monocytes: 194 {cells}/uL — ABNORMAL LOW (ref 200–950)
Basophils Absolute: 31 {cells}/uL (ref 0–200)
Basophils Relative: 0.9 %
Eosinophils Absolute: 41 {cells}/uL (ref 15–500)
Eosinophils Relative: 1.2 %
HCT: 35.3 % (ref 35.0–45.0)
Hemoglobin: 10.9 g/dL — ABNORMAL LOW (ref 11.7–15.5)
MCH: 29.4 pg (ref 27.0–33.0)
MCHC: 30.9 g/dL — ABNORMAL LOW (ref 32.0–36.0)
MCV: 95.1 fL (ref 80.0–100.0)
MPV: 12.8 fL — ABNORMAL HIGH (ref 7.5–12.5)
Monocytes Relative: 5.7 %
Neutro Abs: 1370 {cells}/uL — ABNORMAL LOW (ref 1500–7800)
Neutrophils Relative %: 40.3 %
Platelets: 203 Thousand/uL (ref 140–400)
RBC: 3.71 Million/uL — ABNORMAL LOW (ref 3.80–5.10)
RDW: 11.8 % (ref 11.0–15.0)
Total Lymphocyte: 51.9 %
WBC: 3.4 Thousand/uL — ABNORMAL LOW (ref 3.8–10.8)

## 2024-09-14 LAB — HEMOGLOBIN A1C
Hgb A1c MFr Bld: 5.3 % (ref <5.7)
Mean Plasma Glucose: 105 mg/dL
eAG (mmol/L): 5.8 mmol/L

## 2024-09-14 LAB — IRON,TIBC AND FERRITIN PANEL
%SAT: 22 % (ref 16–45)
Ferritin: 49 ng/mL (ref 16–288)
Iron: 70 ug/dL (ref 45–160)
TIBC: 324 ug/dL (ref 250–450)

## 2024-09-18 ENCOUNTER — Telehealth: Payer: Self-pay

## 2024-09-18 NOTE — Telephone Encounter (Signed)
 Call returned to [facility] and verbal orders authorized per [employer] standing order

## 2024-09-18 NOTE — Telephone Encounter (Signed)
 Copied from CRM [id number]. Topic: Clinical - Home Health Verbal Orders >> Sep 18, 2024  1:54 PM Diannia H wrote: Caller/Agency: [facility] Callback Number: [phone] Service Requested: Physical Therapy Frequency: 1 time a week for 1 week, 2 times a week for 4 weeks, 1 time a week for 4 weeks Any new concerns about the patient? No  Wants to work on exercising for strength and balance.

## 2024-10-17 DIAGNOSIS — K5909 Other constipation: Secondary | ICD-10-CM | POA: Diagnosis not present

## 2024-10-17 DIAGNOSIS — E039 Hypothyroidism, unspecified: Secondary | ICD-10-CM | POA: Diagnosis not present

## 2024-10-17 DIAGNOSIS — G2581 Restless legs syndrome: Secondary | ICD-10-CM | POA: Diagnosis not present

## 2024-10-17 DIAGNOSIS — J309 Allergic rhinitis, unspecified: Secondary | ICD-10-CM | POA: Diagnosis not present

## 2024-10-17 DIAGNOSIS — K5790 Diverticulosis of intestine, part unspecified, without perforation or abscess without bleeding: Secondary | ICD-10-CM | POA: Diagnosis not present

## 2024-10-17 DIAGNOSIS — M5442 Lumbago with sciatica, left side: Secondary | ICD-10-CM | POA: Diagnosis not present

## 2024-10-17 DIAGNOSIS — M47816 Spondylosis without myelopathy or radiculopathy, lumbar region: Secondary | ICD-10-CM | POA: Diagnosis not present

## 2024-10-17 DIAGNOSIS — L84 Corns and callosities: Secondary | ICD-10-CM | POA: Diagnosis not present

## 2024-10-17 DIAGNOSIS — F419 Anxiety disorder, unspecified: Secondary | ICD-10-CM | POA: Diagnosis not present

## 2024-10-17 DIAGNOSIS — R739 Hyperglycemia, unspecified: Secondary | ICD-10-CM | POA: Diagnosis not present

## 2024-10-17 DIAGNOSIS — M810 Age-related osteoporosis without current pathological fracture: Secondary | ICD-10-CM | POA: Diagnosis not present

## 2024-10-17 DIAGNOSIS — H6123 Impacted cerumen, bilateral: Secondary | ICD-10-CM | POA: Diagnosis not present

## 2024-10-21 ENCOUNTER — Other Ambulatory Visit (HOSPITAL_BASED_OUTPATIENT_CLINIC_OR_DEPARTMENT_OTHER): Payer: Self-pay | Admitting: Nurse Practitioner

## 2024-10-21 DIAGNOSIS — Z1231 Encounter for screening mammogram for malignant neoplasm of breast: Secondary | ICD-10-CM

## 2024-10-22 ENCOUNTER — Encounter (HOSPITAL_BASED_OUTPATIENT_CLINIC_OR_DEPARTMENT_OTHER): Payer: Self-pay | Admitting: Radiology

## 2024-10-22 ENCOUNTER — Ambulatory Visit (HOSPITAL_BASED_OUTPATIENT_CLINIC_OR_DEPARTMENT_OTHER)
Admission: RE | Admit: 2024-10-22 | Discharge: 2024-10-22 | Disposition: A | Discharge status: Home / Self Care | Source: Ambulatory Visit | Attending: Nurse Practitioner | Admitting: Nurse Practitioner

## 2024-10-22 DIAGNOSIS — Z1231 Encounter for screening mammogram for malignant neoplasm of breast: Secondary | ICD-10-CM | POA: Insufficient documentation

## 2024-11-10 ENCOUNTER — Ambulatory Visit
Admission: EM | Admit: 2024-11-10 | Discharge: 2024-11-10 | Disposition: A | Discharge status: Home / Self Care | Attending: Family Medicine | Admitting: Family Medicine

## 2024-11-10 DIAGNOSIS — M545 Low back pain, unspecified: Secondary | ICD-10-CM | POA: Insufficient documentation

## 2024-11-10 DIAGNOSIS — R300 Dysuria: Secondary | ICD-10-CM | POA: Insufficient documentation

## 2024-11-10 DIAGNOSIS — G8929 Other chronic pain: Secondary | ICD-10-CM | POA: Diagnosis present

## 2024-11-10 LAB — POCT URINE DIPSTICK
Bilirubin, UA: NEGATIVE
Blood, UA: NEGATIVE
Glucose, UA: NEGATIVE mg/dL
Ketones, POC UA: NEGATIVE mg/dL
Leukocytes, UA: NEGATIVE
Nitrite, UA: NEGATIVE
POC PROTEIN,UA: NEGATIVE
Spec Grav, UA: 1.01
Urobilinogen, UA: 0.2 U/dL
pH, UA: 6

## 2024-11-10 MED ORDER — KETOROLAC TROMETHAMINE 30 MG/ML IJ SOLN
30.0000 mg | Freq: Once | INTRAMUSCULAR | Status: AC
  Administered 2024-11-10: 30 mg via INTRAMUSCULAR

## 2024-11-10 NOTE — ED Triage Notes (Signed)
 Pt presents to UC for c/o burning on urination x2 days.Taking cystex w/o relief. Hx chronic UTI Pt was on Keflex  x6  months and cranberry tablets by urologist but stopped. She still takes cranberry tablets.

## 2024-11-10 NOTE — ED Provider Notes (Signed)
 " [facility]    CSN: [id number] Arrival date & time: 11/10/24  1058      History   Chief Complaint No chief complaint on file.   HPI Colleen [name] is a 79 y.o. female presents for dysuria and chronic low back pain.  Patient reports 2 days of urinary burning, urgency, frequency.  Denies hematuria, fevers, nausea/vomiting, flank pain.  Has a history of recurrent UTIs in the past.  Had previously been on maintenance Keflex  daily but that was several years ago.  Does currently take cranberry pills.  In addition she does have a history of chronic low back pain and does follow with pain management.  States it seems worse over the past couple of days.  Patient and daughter requesting Toradol  injection.  States she has had in the past and tolerated well.  No other concerns.  HPI  Past Medical History:  Diagnosis Date   Allergic rhinitis    Anxiety    Arthritis    Arthritis of left wrist 10/13/2021   Atrophic vaginitis 06/25/2019   Biceps tendinitis 02/07/2022   Breast screening 10/26/2010   Last Assessment & Plan:  Formatting of this note might be different from the original. Will order a mammogram for this fall;  Follow up with me around Thanksgiving.   Callus of heel 02/19/2013   Cardiac murmur 10/09/2020   Colon polyps    adenomatous   Constipation 08/20/2013   Corns and callosities    Diverticulosis    DOE (dyspnea on exertion) 10/09/2020   Dysuria 06/03/2011   Last Assessment & Plan:  Formatting of this note might be different from the original. New, acute complaint today;  Will check urinalysis with her labs today;   Full thickness rotator cuff tear 02/07/2022   GERD (gastroesophageal reflux disease)    Glaucoma    H/O total hysterectomy 02/19/2013   Hallux valgus 10/26/2010   Last Assessment & Plan:  Formatting of this note might be different from the original. Had surgery with Dr Jenney back in October;   Will have her follow up with him.   Heartburn 04/30/2014    Helicobacter pylori gastritis 08/12/2014   Hematoma of lower leg 06/12/2018   Hiatal hernia with gastroesophageal reflux 12/23/2014   Hypercholesteremia    no meds   Hypercholesterolemia 06/03/2011   Overview:  TC 205 (elevated) in 2010  Last Assessment & Plan:  Formatting of this note might be different from the original. Lab Results  Component Value Date   CHOL 205 12/09/2011   CHOL 233 06/03/2011   CHOL 205 05/26/2009   Lab Results  Component Value Date   HDL 82 12/09/2011   HDL 90 06/03/2011   HDL 75 05/26/2009   Lab Results  Component Value Date   LDLCALC 113 12/09/2011   LDLCALC 133 06/03/2011   LDLCA   Hypothyroidism    Left-sided low back pain with left-sided sciatica 08/12/2014   Neck pain 10/26/2010   Obesity    Osteoarthritis 10/26/2010   Osteoarthritis of acromioclavicular joint 02/07/2022   Osteopenia 01/08/2014   Overweight 10/26/2010   Postmenopausal estrogen deficiency 02/19/2013   Recurrent UTI 06/25/2019   Restless leg syndrome 08/20/2013   Routine general medical examination at a health care facility 02/19/2013   S/P carpal tunnel release 10/26/2010   Screen for colon cancer 12/16/2011   Last Assessment & Plan:  Formatting of this note might be different from the original. Refer to GI;   Screening for diabetes mellitus  Shoulder pain, right 12/16/2011   Last Assessment & Plan:  Formatting of this note might be different from the original. We have tried physical therapy;   We shot and reviewed films today;  She has  1)  Osteopenia 2)  Small spur at humeral head 3)  Some hypertrophic/lipping changes at the inferior glenoid rim;   I also injected it last visit;  She is on NSAIDS and tylenol ;  Will trial voltaren  gel;  Then get ortho opinion;   Skin spots-aging 07/08/2014   Strain of neck muscle 06/12/2018   Urge incontinence    Whiplash injury 06/26/2018    Patient Active Problem List   Diagnosis Date Noted   Spondylolysis of lumbar region 09/11/2024   Osteoporosis without  current pathological fracture 03/08/2024   Chronic left shoulder pain 03/08/2024   Hyperglycemia 03/08/2024   Essential (primary) hypertension 03/08/2024   Memory loss 03/08/2024   Biceps tendinitis 02/07/2022   Cardiac murmur 10/09/2020   Allergic rhinitis    Anxiety    Arthritis    Corns and callosities    Diverticulosis    Glaucoma    Obesity    Urge incontinence    Atrophic vaginitis 06/25/2019   Hiatal hernia with gastroesophageal reflux 12/23/2014   Left-sided low back pain with left-sided sciatica 08/12/2014   Restless leg syndrome 08/20/2013   Constipation 08/20/2013   Hypercholesterolemia 06/03/2011   Hypothyroidism 10/26/2010   Osteoarthritis 10/26/2010    Past Surgical History:  Procedure Laterality Date   ABDOMINAL HYSTERECTOMY  1985   Dr Henry EDWARDS Right    Dr Jenney   CARPAL TUNNEL RELEASE Bilateral 2010   Dr Georgina   CATARACT EXTRACTION Bilateral    CESAREAN SECTION  1983   1 time   COLONOSCOPY     ROTATOR CUFF REPAIR Left 03/10/2022   Dr Gerome   THYROIDECTOMY  1987   Dr Simeon   TONSILLECTOMY  1951    OB History   No obstetric history on file.      Home Medications    Prior to Admission medications  Medication Sig Start Date End Date Taking? Authorizing Provider  alendronate  (FOSAMAX ) 70 MG tablet Take 1 tablet (70 mg total) by mouth every 7 (seven) days. Take with a full glass of water on an empty stomach. 03/08/24  Yes [name], NP  aspirin  EC 81 MG tablet Take 81 mg by mouth every other day. Swallow whole.   Yes [provider]  atorvastatin  (LIPITOR) 10 MG tablet TAKE 0.5 TABLETS (5 MG TOTAL) BY MOUTH DAILY. E78.00 07/09/24  Yes [name], NP  buprenorphine (BUTRANS) 20 MCG/HR PTWK Place 1 patch onto the skin once a week. 08/15/24  Yes [provider]  CALCIUM -VITAMIN D  PO Take 1 capsule by mouth daily.   Yes [provider]  CRANBERRY PO Take 1 tablet by mouth daily.   Yes  [provider]  cyclobenzaprine (FLEXERIL) 10 MG tablet Take 10 mg by mouth at bedtime.   Yes [provider]  diclofenac  Sodium (VOLTAREN ) 1 % GEL APPLY 2 GRAMS TOPICALLY 4 (FOUR) TIMES DAILY AS NEEDED. 09/05/23  Yes [name], NP  HYDROcodone -acetaminophen  (NORCO) 7.5-325 MG tablet Take 1 tablet by mouth every 6 (six) hours as needed. 10/08/24  Yes [provider]  hydroxypropyl methylcellulose / hypromellose (ISOPTO TEARS / GONIOVISC) 2.5 % ophthalmic solution Place 1 drop into both eyes as needed for dry eyes.    Yes [provider]  KRILL OIL OMEGA-3 PO Take 1 tablet by mouth daily.   Yes [provider]  levothyroxine  (SYNTHROID ) 25 MCG tablet TAKE 1 TABLET BY MOUTH EVERY DAY BEFORE BREAKFAST 02/23/24  Yes Eubanks, Jessica K, NP  lidocaine (LIDODERM) 5 % Place 1 patch onto the skin daily. Remove & Discard patch within 12 hours or as directed by MD   Yes [provider]  methocarbamol  (ROBAXIN ) 500 MG tablet Take 500 mg by mouth daily. Every morning   Yes [provider]  Multiple Vitamin (MULTIVITAMIN WITH MINERALS) TABS Take 1 tablet by mouth daily.   Yes [provider]  omeprazole  (PRILOSEC) 40 MG capsule Take 1 capsule (40 mg total) by mouth daily. 06/19/24  Yes [name], NP  HYDROcodone -acetaminophen  (NORCO/VICODIN) 5-325 MG tablet Take 1 tablet by mouth every 8 (eight) hours as needed. 07/12/23     HYDROcodone -acetaminophen  (NORCO/VICODIN) 5-325 MG tablet Take 1 tablet by mouth every 4 (four) hours.    [provider]    Family History Family History  Problem Relation Age of Onset   Leukemia Mother 38   Lung cancer Father    Diabetes Brother    COPD Brother    Lung cancer Brother    Diabetes Maternal Grandmother    Cancer Maternal Grandfather        unknown type   Breast cancer Maternal Aunt    Colon polyps Neg Hx    Rectal cancer Neg Hx     Social History Social  History[1]   Allergies   Patient has no known allergies.   Review of Systems Review of Systems  Genitourinary:  Positive for dysuria.  Musculoskeletal:  Positive for back pain.     Physical Exam Triage Vital Signs ED Triage Vitals  Encounter Vitals Group     BP 11/10/24 1116 (!) 121/59     Girls Systolic BP Percentile --      Girls Diastolic BP Percentile --      Boys Systolic BP Percentile --      Boys Diastolic BP Percentile --      Pulse Rate 11/10/24 1116 72     Resp 11/10/24 1116 18     Temp 11/10/24 1116 98.5 F (36.9 C)     Temp Source 11/10/24 1116 Oral     SpO2 11/10/24 1116 96 %     Weight --      Height --      Head Circumference --      Peak Flow --      Pain Score 11/10/24 1110 2     Pain Loc --      Pain Education --      Exclude from Growth Chart --    No data found.  Updated Vital Signs BP (!) 121/59 (BP Location: Right Arm)   Pulse 72   Temp 98.5 F (36.9 C) (Oral)   Resp 18   SpO2 96%   Visual Acuity Right Eye Distance:   Left Eye Distance:   Bilateral Distance:    Right Eye Near:   Left Eye Near:    Bilateral Near:     Physical Exam Vitals and nursing note reviewed.  Constitutional:      General: She is not in acute distress.    Appearance: Normal appearance. She is not ill-appearing.  HENT:     Head: Normocephalic and atraumatic.  Eyes:     Pupils: Pupils are equal, round, and reactive to light.  Cardiovascular:  Rate and Rhythm: Normal rate.  Pulmonary:     Effort: Pulmonary effort is normal.  Musculoskeletal:     Lumbar back: Tenderness and bony tenderness present.       Back:  Skin:    General: Skin is warm and dry.  Neurological:     General: No focal deficit present.     Mental Status: She is alert and oriented to person, place, and time.  Psychiatric:        Mood and Affect: Mood normal.        Behavior: Behavior normal.      UC Treatments / Results  Labs (all labs ordered are listed, but only  abnormal results are displayed) Labs Reviewed  URINE CULTURE  POCT URINE DIPSTICK   Comprehensive metabolic panel with GFR Order: [id number]  Status: Final result     Next appt: 01/02/2025 at 09:00 AM in Internal Medicine [place] MARLA An, NP)     Dx: Essential (primary) hypertension   Test Result Released: Yes (seen)     Messages: Seen   5 Result Notes     1 Patient Communication     View Follow-Up Encounter          Component Ref Range & Units (hover) 2 mo ago (09/10/24) 7 mo ago (04/09/24) 8 mo ago (02/29/24) 1 yr ago (06/22/23) 1 yr ago (02/24/23) 2 yr ago (08/22/22) 2 yr ago (02/18/22)  Glucose, Bld 91 98 VC, R, CM 94 CM 93 CM 129 High  CM 89 CM 94 CM  Comment: .            Fasting reference interval .  BUN 15 11 19 18 19 23 19   Creat 1.00 High  0.96 R 1.20 High  R 1.01 High  R 1.14 High  R 1.01 High  R 0.91 R  eGFR 57 Low    57 Low  49 Low  57 Low  65 CM  BUN/Creatinine Ratio 15 SEE NOTE: CM 16 18 17 23  High  NOT APPLICABLE  Sodium 143 141 144 143 143 144 145  Potassium 4.2 5.1 4.4 5.2 4.0 4.2 4.3  Chloride 108 107 106 107 106 107 106  CO2 26 27 28 31 29 26 31   Calcium  9.2 9.5 9.7 9.7 9.2 9.8 9.8  Total Protein 6.6  6.8  6.5 6.8 6.7  Albumin 4.1  4.3  4.2 4.2 4.1  Globulin 2.5  2.5  2.3 2.6 2.6  AG Ratio 1.6  1.7  1.8 1.6 1.6  Total Bilirubin 0.4  0.5  0.5 0.5 0.4  Alkaline phosphatase (APISO) 66  72  64 96 83  AST 30  30  30 30 31   ALT 12  14  15 13 15   Resulting Agency [facility] [place] [facility] [place] [facility] [place] [facility] [place] [facility] [place] [facility] [place] [facility] [place]         Resulting Agency's Comment  Performing Organization Information:     [facility] (CLIA: [id number])     Name: ORLIN SUMNER MORITA     Address: [address] [place], [place] [address]     Director: ROMERO RAV MD  Specimen Collected: 09/10/24 10:52 Last  Resulted: 09/14/24 03:58   EKG   Radiology No results found.  Procedures Procedures (including critical care time)  Medications Ordered in UC Medications  ketorolac  (TORADOL ) 30 MG/ML injection 30 mg (has no administration in time range)    Initial Impression / Assessment and Plan /  UC Course  I have reviewed the triage vital signs and the nursing notes.  Pertinent labs & imaging results that were available during my care of the patient were reviewed by me and considered in my medical decision making (see chart for details).     Reviewed exam and symptoms with patient and daughter.  Urine negative for UTI, will culture per her request.  Toradol  injection given in clinic.  Instructed no NSAIDs for 24 hours and she verbalized understanding.  PCP follow-up 2 to 3 days for recheck.  ER precautions reviewed. Final Clinical Impressions(s) / UC Diagnoses   Final diagnoses:  Dysuria  Chronic midline low back pain, unspecified whether sciatica present     Discharge Instructions      The clinic will contact you with results of the urine culture done today if positive.  You may use over-the-counter Azo as needed.  Lots of rest and fluids. You were given a Toradol  injection in clinic today. Do not take any over the counter NSAID's such as Advil, ibuprofen, Aleve, or naproxen for 24 hours. Please follow-up with your PCP in 2 to 3 days for recheck.  Please go to the emergency room for any worsening symptoms.  Hope you feel better soon!      ED Prescriptions   None    PDMP not reviewed this encounter.     [1]  Social History Tobacco Use   Smoking status: Former    Current packs/day: 0.00    Types: Cigarettes    Quit date: 10/31/1986    Years since quitting: 38.0   Smokeless tobacco: Never  Vaping Use   Vaping status: Never Used  Substance Use Topics   Alcohol use: No    Alcohol/week: 0.0 standard drinks of alcohol   Drug use: No     Loreda Myla SAUNDERS, NP 11/10/24  1142  "

## 2024-11-10 NOTE — Discharge Instructions (Addendum)
 The clinic will contact you with results of the urine culture done today if positive.  You may use over-the-counter Azo as needed.  Lots of rest and fluids. You were given a Toradol  injection in clinic today. Do not take any over the counter NSAID's such as Advil, ibuprofen, Aleve, or naproxen for 24 hours. Please follow-up with your PCP in 2 to 3 days for recheck.  Please go to the emergency room for any worsening symptoms.  Hope you feel better soon!

## 2024-11-11 ENCOUNTER — Ambulatory Visit (HOSPITAL_COMMUNITY): Payer: Self-pay

## 2024-11-11 LAB — URINE CULTURE: Culture: <10000 — AB

## 2024-11-12 ENCOUNTER — Other Ambulatory Visit (HOSPITAL_BASED_OUTPATIENT_CLINIC_OR_DEPARTMENT_OTHER): Payer: Self-pay

## 2024-11-12 ENCOUNTER — Ambulatory Visit: Payer: Self-pay | Admitting: Pharmacist

## 2024-11-20 ENCOUNTER — Other Ambulatory Visit: Payer: Self-pay

## 2024-11-20 ENCOUNTER — Other Ambulatory Visit (HOSPITAL_COMMUNITY): Payer: Self-pay

## 2024-11-20 ENCOUNTER — Encounter: Payer: Self-pay | Admitting: Nurse Practitioner

## 2024-11-20 DIAGNOSIS — E7800 Pure hypercholesterolemia, unspecified: Secondary | ICD-10-CM

## 2024-11-21 ENCOUNTER — Other Ambulatory Visit: Payer: Self-pay

## 2024-11-21 ENCOUNTER — Other Ambulatory Visit (HOSPITAL_COMMUNITY): Payer: Self-pay

## 2024-11-21 MED ORDER — OMEPRAZOLE 40 MG PO CPDR
40.0000 mg | DELAYED_RELEASE_CAPSULE | Freq: Every day | ORAL | 1 refills | Status: AC
  Filled 2024-11-21: qty 30, 30d supply, fill #0

## 2024-11-21 MED ORDER — KRILL OIL OMEGA-3 500 MG PO CAPS
500.0000 mg | ORAL_CAPSULE | Freq: Every day | ORAL | 3 refills | Status: AC
  Filled 2024-11-21: qty 30, fill #0
  Filled 2024-11-26: qty 30, 30d supply, fill #0

## 2024-11-21 MED ORDER — VITAMIN D3 25 MCG (1000 UT) PO CAPS
1000.0000 [IU] | ORAL_CAPSULE | Freq: Every day | ORAL | 0 refills | Status: AC
  Filled 2024-11-21: qty 30, 30d supply, fill #0

## 2024-11-21 MED ORDER — ADULT MULTIVITAMIN W/MINERALS CH
1.0000 | ORAL_TABLET | Freq: Every day | ORAL | 3 refills | Status: AC
  Filled 2024-11-21: qty 30, 30d supply, fill #0

## 2024-11-21 MED ORDER — CALCIUM CARB-CHOLECALCIFEROL 600-10 MG-MCG PO TABS
1.0000 | ORAL_TABLET | Freq: Every day | ORAL | 0 refills | Status: AC
  Filled 2024-11-21: qty 30, 30d supply, fill #0

## 2024-11-21 MED ORDER — ASPIRIN EC 81 MG PO TBEC
81.0000 mg | DELAYED_RELEASE_TABLET | Freq: Every day | ORAL | 3 refills | Status: AC
  Filled 2024-11-21 – 2024-11-22 (×2): qty 30, 30d supply, fill #0

## 2024-11-21 MED ORDER — METHOCARBAMOL 500 MG PO TABS
500.0000 mg | ORAL_TABLET | Freq: Every morning | ORAL | 3 refills | Status: AC
  Filled 2024-11-21 – 2024-11-25 (×3): qty 30, 30d supply, fill #0

## 2024-11-21 MED ORDER — CYCLOBENZAPRINE HCL 10 MG PO TABS
10.0000 mg | ORAL_TABLET | Freq: Every evening | ORAL | 3 refills | Status: AC
  Filled 2024-11-21 – 2024-11-22 (×2): qty 30, 30d supply, fill #0

## 2024-11-21 MED ORDER — CRANBERRY JUICE POWDER 425 MG PO CAPS
1.0000 | ORAL_CAPSULE | Freq: Every day | ORAL | 3 refills | Status: AC
  Filled 2024-11-21: qty 30, 30d supply, fill #0

## 2024-11-21 MED ORDER — ATORVASTATIN CALCIUM 10 MG PO TABS
5.0000 mg | ORAL_TABLET | Freq: Every day | ORAL | 1 refills | Status: AC
  Filled 2024-11-21 – 2024-11-27 (×3): qty 45, 90d supply, fill #0

## 2024-11-21 NOTE — Telephone Encounter (Signed)
 Okay to change the ASA to daily and to send the medication she request to [place] outpatient pharmacy.  Please let her know when this is done. Thank you

## 2024-11-21 NOTE — Telephone Encounter (Signed)
 Spoke with the daughter and the refills has been sent to the pharmacy as requested

## 2024-11-22 ENCOUNTER — Other Ambulatory Visit (HOSPITAL_COMMUNITY): Payer: Self-pay

## 2024-11-22 ENCOUNTER — Other Ambulatory Visit: Payer: Self-pay

## 2024-11-25 ENCOUNTER — Other Ambulatory Visit: Payer: Self-pay

## 2024-11-25 ENCOUNTER — Other Ambulatory Visit (HOSPITAL_COMMUNITY): Payer: Self-pay

## 2024-11-26 ENCOUNTER — Other Ambulatory Visit: Payer: Self-pay

## 2024-11-27 ENCOUNTER — Other Ambulatory Visit: Payer: Self-pay

## 2024-11-28 ENCOUNTER — Other Ambulatory Visit: Payer: Self-pay

## 2024-12-02 ENCOUNTER — Other Ambulatory Visit: Payer: Self-pay

## 2024-12-03 ENCOUNTER — Other Ambulatory Visit: Payer: Self-pay

## 2025-01-02 ENCOUNTER — Encounter: Payer: Medicare Other | Admitting: Nurse Practitioner

## 2025-03-14 ENCOUNTER — Ambulatory Visit: Admitting: Nurse Practitioner
